# Patient Record
Sex: Female | Born: 1955
Health system: Southern US, Community
[De-identification: ages and names within clinical notes are randomized; demographics above are authoritative.]

## PROBLEM LIST (undated history)

## (undated) DIAGNOSIS — G4730 Sleep apnea, unspecified: Secondary | ICD-10-CM

## (undated) DIAGNOSIS — E119 Type 2 diabetes mellitus without complications: Secondary | ICD-10-CM

## (undated) DIAGNOSIS — I10 Essential (primary) hypertension: Secondary | ICD-10-CM

## (undated) DIAGNOSIS — E7800 Pure hypercholesterolemia, unspecified: Secondary | ICD-10-CM

## (undated) HISTORY — DX: Sleep apnea, unspecified: G47.30

## (undated) HISTORY — PX: MYOMECTOMY: SHX85

## (undated) HISTORY — PX: ABDOMINAL HYSTERECTOMY: SHX81

## (undated) HISTORY — PX: CATARACT EXTRACTION: SUR2

---

## 1979-06-26 HISTORY — PX: APPENDECTOMY: SHX54

## 2005-09-07 ENCOUNTER — Encounter: Admission: RE | Admit: 2005-09-07 | Discharge: 2005-09-07 | Payer: Self-pay | Admitting: Family Medicine

## 2005-10-02 ENCOUNTER — Encounter: Admission: RE | Admit: 2005-10-02 | Discharge: 2005-10-02 | Payer: Self-pay | Admitting: Family Medicine

## 2008-09-19 ENCOUNTER — Observation Stay (HOSPITAL_COMMUNITY): Admission: EM | Admit: 2008-09-19 | Discharge: 2008-09-20 | Payer: Self-pay | Admitting: Emergency Medicine

## 2008-09-20 ENCOUNTER — Encounter: Payer: Self-pay | Admitting: Infectious Disease

## 2008-11-16 ENCOUNTER — Encounter: Admission: RE | Admit: 2008-11-16 | Discharge: 2008-11-16 | Payer: Self-pay | Admitting: Family Medicine

## 2010-07-16 ENCOUNTER — Encounter: Payer: Self-pay | Admitting: Family Medicine

## 2010-10-05 ENCOUNTER — Inpatient Hospital Stay (INDEPENDENT_AMBULATORY_CARE_PROVIDER_SITE_OTHER)
Admission: RE | Admit: 2010-10-05 | Discharge: 2010-10-05 | Disposition: A | Discharge status: Home / Self Care | Payer: PRIVATE HEALTH INSURANCE | Source: Ambulatory Visit | Attending: Emergency Medicine | Admitting: Emergency Medicine

## 2010-10-05 DIAGNOSIS — M2550 Pain in unspecified joint: Secondary | ICD-10-CM

## 2010-10-05 DIAGNOSIS — Z9119 Patient's noncompliance with other medical treatment and regimen: Secondary | ICD-10-CM

## 2010-10-05 DIAGNOSIS — E119 Type 2 diabetes mellitus without complications: Secondary | ICD-10-CM

## 2010-10-05 LAB — POCT I-STAT, CHEM 8
BUN: 19 mg/dL (ref 6–23)
BUN: 12 mg/dL (ref 6–23)
Calcium, Ion: 1.2 mmol/L (ref 1.12–1.32)
Calcium, Ion: 1.16 mmol/L (ref 1.12–1.32)
Chloride: 104 mEq/L (ref 96–112)
Chloride: 104 mEq/L (ref 96–112)
Creatinine, Ser: 0.5 mg/dL (ref 0.4–1.2)
Creatinine, Ser: 0.5 mg/dL (ref 0.4–1.2)
Glucose, Bld: 208 mg/dL — ABNORMAL HIGH (ref 70–99)
Glucose, Bld: 339 mg/dL — ABNORMAL HIGH (ref 70–99)
HCT: 38 % (ref 36.0–46.0)
HCT: 42 % (ref 36.0–46.0)
Hemoglobin: 12.9 g/dL (ref 12.0–15.0)
Hemoglobin: 14.3 g/dL (ref 12.0–15.0)
Potassium: 3.8 mEq/L (ref 3.5–5.1)
Potassium: 3.8 mEq/L (ref 3.5–5.1)
Sodium: 140 mEq/L (ref 135–145)
Sodium: 137 mEq/L (ref 135–145)
TCO2: 25 mmol/L (ref 0–100)
TCO2: 24 mmol/L (ref 0–100)

## 2010-10-05 LAB — BASIC METABOLIC PANEL
BUN: 11 mg/dL (ref 6–23)
BUN: 13 mg/dL (ref 6–23)
CO2: 25 mEq/L (ref 19–32)
CO2: 26 mEq/L (ref 19–32)
Calcium: 8.6 mg/dL (ref 8.4–10.5)
Calcium: 8.7 mg/dL (ref 8.4–10.5)
Chloride: 103 mEq/L (ref 96–112)
Chloride: 104 mEq/L (ref 96–112)
Creatinine, Ser: 0.45 mg/dL (ref 0.4–1.2)
Creatinine, Ser: 0.57 mg/dL (ref 0.4–1.2)
GFR calc Af Amer: >60 mL/min (ref >60)
GFR calc Af Amer: >60 mL/min (ref >60)
GFR calc non Af Amer: >60 mL/min (ref >60)
GFR calc non Af Amer: >60 mL/min (ref >60)
Glucose, Bld: 249 mg/dL — ABNORMAL HIGH (ref 70–99)
Glucose, Bld: 255 mg/dL — ABNORMAL HIGH (ref 70–99)
Potassium: 3.6 mEq/L (ref 3.5–5.1)
Potassium: 3.9 mEq/L (ref 3.5–5.1)
Sodium: 135 mEq/L (ref 135–145)
Sodium: 135 mEq/L (ref 135–145)

## 2010-10-05 LAB — CBC
HCT: 36.4 % (ref 36.0–46.0)
HCT: 38.9 % (ref 36.0–46.0)
Hemoglobin: 12.5 g/dL (ref 12.0–15.0)
Hemoglobin: 13.5 g/dL (ref 12.0–15.0)
MCHC: 34.3 g/dL (ref 30.0–36.0)
MCHC: 34.7 g/dL (ref 30.0–36.0)
MCV: 84.8 fL (ref 78.0–100.0)
MCV: 86.8 fL (ref 78.0–100.0)
Platelets: 166 10*3/uL (ref 150–400)
Platelets: 172 10*3/uL (ref 150–400)
RBC: 4.19 MIL/uL (ref 3.87–5.11)
RBC: 4.59 MIL/uL (ref 3.87–5.11)
RDW: 13.1 % (ref 11.5–15.5)
RDW: 13.5 % (ref 11.5–15.5)
WBC: 3.6 10*3/uL — ABNORMAL LOW (ref 4.0–10.5)
WBC: 4.8 10*3/uL (ref 4.0–10.5)

## 2010-10-05 LAB — URINALYSIS, ROUTINE W REFLEX MICROSCOPIC
Bilirubin Urine: NEGATIVE
Glucose, UA: >1000 mg/dL — AB
Hgb urine dipstick: NEGATIVE
Ketones, ur: 40 mg/dL — AB
Leukocytes, UA: NEGATIVE
Nitrite: NEGATIVE
Protein, ur: NEGATIVE mg/dL
Specific Gravity, Urine: >1.046 — ABNORMAL HIGH (ref 1.005–1.030)
Urobilinogen, UA: 1 mg/dL (ref 0.0–1.0)
pH: 6 (ref 5.0–8.0)

## 2010-10-05 LAB — CARDIAC PANEL(CRET KIN+CKTOT+MB+TROPI)
CK, MB: 0.7 ng/mL (ref 0.3–4.0)
Relative Index: INVALID (ref 0.0–2.5)
Total CK: 55 U/L (ref 7–177)
Troponin I: <0.01 ng/mL (ref 0.00–0.06)

## 2010-10-05 LAB — COMPREHENSIVE METABOLIC PANEL
ALT: 22 U/L (ref 0–35)
AST: 19 U/L (ref 0–37)
Albumin: 4.1 g/dL (ref 3.5–5.2)
Alkaline Phosphatase: 109 U/L (ref 39–117)
BUN: 10 mg/dL (ref 6–23)
CO2: 24 mEq/L (ref 19–32)
Calcium: 9.2 mg/dL (ref 8.4–10.5)
Chloride: 104 mEq/L (ref 96–112)
Creatinine, Ser: 0.49 mg/dL (ref 0.4–1.2)
GFR calc Af Amer: >60 mL/min (ref >60)
GFR calc non Af Amer: >60 mL/min (ref >60)
Glucose, Bld: 347 mg/dL — ABNORMAL HIGH (ref 70–99)
Potassium: 3.7 mEq/L (ref 3.5–5.1)
Sodium: 138 mEq/L (ref 135–145)
Total Bilirubin: 0.6 mg/dL (ref 0.3–1.2)
Total Protein: 7 g/dL (ref 6.0–8.3)

## 2010-10-05 LAB — DIFFERENTIAL
Basophils Absolute: 0 10*3/uL (ref 0.0–0.1)
Basophils Relative: 1 % (ref 0–1)
Eosinophils Absolute: 0.1 10*3/uL (ref 0.0–0.7)
Eosinophils Relative: 2 % (ref 0–5)
Lymphocytes Relative: 43 % (ref 12–46)
Lymphs Abs: 1.6 10*3/uL (ref 0.7–4.0)
Monocytes Absolute: 0.7 10*3/uL (ref 0.1–1.0)
Monocytes Relative: 19 % — ABNORMAL HIGH (ref 3–12)
Neutro Abs: 1.3 10*3/uL — ABNORMAL LOW (ref 1.7–7.7)
Neutrophils Relative %: 35 % — ABNORMAL LOW (ref 43–77)

## 2010-10-05 LAB — CK TOTAL AND CKMB (NOT AT ARMC)
CK, MB: 1 ng/mL (ref 0.3–4.0)
Relative Index: INVALID (ref 0.0–2.5)
Total CK: 68 U/L (ref 7–177)

## 2010-10-05 LAB — LIPID PANEL
Cholesterol: 156 mg/dL (ref 0–200)
HDL: 28 mg/dL — ABNORMAL LOW (ref >39)
LDL Cholesterol: UNDETERMINED mg/dL (ref 0–99)
Total CHOL/HDL Ratio: 5.6 RATIO
Triglycerides: 965 mg/dL — ABNORMAL HIGH (ref <150)
VLDL: UNDETERMINED mg/dL (ref 0–40)

## 2010-10-05 LAB — POCT CARDIAC MARKERS
CKMB, poc: 1.1 ng/mL (ref 1.0–8.0)
Myoglobin, poc: 40.6 ng/mL (ref 12–200)
Troponin i, poc: <0.05 ng/mL (ref 0.00–0.09)

## 2010-10-05 LAB — RAPID URINE DRUG SCREEN, HOSP PERFORMED
Amphetamines: NOT DETECTED
Barbiturates: NOT DETECTED
Benzodiazepines: NOT DETECTED
Cocaine: NOT DETECTED
Opiates: NOT DETECTED
Tetrahydrocannabinol: NOT DETECTED

## 2010-10-05 LAB — GLUCOSE, CAPILLARY
Glucose-Capillary: 200 mg/dL — ABNORMAL HIGH (ref 70–99)
Glucose-Capillary: 143 mg/dL — ABNORMAL HIGH (ref 70–99)
Glucose-Capillary: 239 mg/dL — ABNORMAL HIGH (ref 70–99)
Glucose-Capillary: 262 mg/dL — ABNORMAL HIGH (ref 70–99)
Glucose-Capillary: 264 mg/dL — ABNORMAL HIGH (ref 70–99)
Glucose-Capillary: 265 mg/dL — ABNORMAL HIGH (ref 70–99)
Glucose-Capillary: 274 mg/dL — ABNORMAL HIGH (ref 70–99)
Glucose-Capillary: 347 mg/dL — ABNORMAL HIGH (ref 70–99)
Glucose-Capillary: 372 mg/dL — ABNORMAL HIGH (ref 70–99)

## 2010-10-05 LAB — D-DIMER, QUANTITATIVE (NOT AT ARMC): D-Dimer, Quant: 0.5 ug/mL-FEU — ABNORMAL HIGH (ref 0.00–0.48)

## 2010-10-05 LAB — POCT URINALYSIS DIP (DEVICE)
Bilirubin Urine: NEGATIVE
Glucose, UA: 500 mg/dL — AB
Hgb urine dipstick: NEGATIVE
Ketones, ur: 15 mg/dL — AB
Nitrite: NEGATIVE
Protein, ur: 30 mg/dL — AB
Specific Gravity, Urine: >=1.03 (ref 1.005–1.030)
Urobilinogen, UA: 0.2 mg/dL (ref 0.0–1.0)
pH: 5.5 (ref 5.0–8.0)

## 2010-10-05 LAB — FOLATE RBC: RBC Folate: 817 ng/mL — ABNORMAL HIGH (ref 180–600)

## 2010-10-05 LAB — APTT: aPTT: 30 seconds (ref 24–37)

## 2010-10-05 LAB — HEMOGLOBIN A1C
Hgb A1c MFr Bld: 11.3 % — ABNORMAL HIGH (ref 4.6–6.1)
Mean Plasma Glucose: 278 mg/dL

## 2010-10-05 LAB — TECHNOLOGIST SMEAR REVIEW

## 2010-10-05 LAB — PROTIME-INR
INR: 1 (ref 0.00–1.49)
Prothrombin Time: 13.3 seconds (ref 11.6–15.2)

## 2010-10-05 LAB — MAGNESIUM: Magnesium: 2.3 mg/dL (ref 1.5–2.5)

## 2010-10-05 LAB — TSH: TSH: 0.801 u[IU]/mL (ref 0.350–4.500)

## 2010-10-05 LAB — VITAMIN B12: Vitamin B-12: 438 pg/mL (ref 211–911)

## 2010-10-05 LAB — HEPARIN LEVEL (UNFRACTIONATED)
Heparin Unfractionated: 0.2 IU/mL — ABNORMAL LOW (ref 0.30–0.70)
Heparin Unfractionated: 0.33 IU/mL (ref 0.30–0.70)

## 2010-10-05 LAB — BRAIN NATRIURETIC PEPTIDE: Pro B Natriuretic peptide (BNP): <30 pg/mL (ref 0.0–100.0)

## 2010-10-05 LAB — HIV ANTIBODY (ROUTINE TESTING W REFLEX): HIV: NONREACTIVE

## 2010-10-05 LAB — TROPONIN I: Troponin I: <0.01 ng/mL (ref 0.00–0.06)

## 2010-11-07 NOTE — H&P (Signed)
NAMERYLEA, SELWAY                 ACCOUNT NO.:  [id number]   MEDICAL RECORD NO.:  [id number]          PATIENT TYPE:  INP   LOCATION:  2918                         FACILITY:  [facility]   PHYSICIAN:  Acey Lav, MD  DATE OF BIRTH:  1955/08/01   DATE OF ADMISSION:  09/19/2008  DATE OF DISCHARGE:                              HISTORY & PHYSICAL   CHIEF COMPLAINT:  Chest pain.   HISTORY OF PRESENT ILLNESS:  Ms. Breisch is a 55 year old African American  lady with a past medical history significant for diabetes, hypertension,  hyperlipidemia, and smoking who presents with new onset chest pain that  began on Friday.  On Friday morning, she was awoken by a chest pain that  she describes as a sharp sensation underneath her left breast going up  into her left arm, that was not accompanied by nausea or diaphoresis,  but was quite disturbing to the patient.  She was awoken from sleep in  the morning with this chest pain.  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  During  the day, her chest pain persisted throughout the entire day.  There were  times at which it got better and went down to about 5-6/10 in severity  and other times it went back to 8 or 9/10 in severity.  The only  exacerbating factor that she identified on Friday was that exertion seem  to make the chest pain worse.  She then went to sleep on Friday night  still with chest pain went to sleep, then was awoken Saturday morning  again with chest discomfort, but now actually with a chest pressure.  This chest pressure was 7/10 in severity and persisted throughout the  day at times.  It went down to about 5/10 in severity as well, but again  persisted.  The chest pressure was made worse with exertion.  Additionally, she noted some dyspnea on exertion while at work.  She  went to bed on Saturday night and then was awoken again this Sunday  morning with sharp chest pain under  the left breast, going into her neck  and left side of her head in her frontal and temporal distribution.  She  additionally now again had chest pressure substernally and had pain  going into her left arm as well as numbness down the entire left side of  her left arm.  Additionally, she complained of some pain in her left  leg.  She was quite concerned by this and came via EMS to the emergency  department.  Her leg pain had disappeared by the time she arrived in the  emergency department.  She was given 4 baby aspirins and then given a  sublingual nitroglycerin.  The sublingual nitroglycerin helped with her  chest pressure which went away.  Additionally, her headache went away.  Her left-sided sharp chest pain and pain going down into her left arm  did persist.  Her initial cardiac enzymes in the emergency department  were negative.  Her D-dimer was  slightly positive, but her CT angiogram  was negative for pulmonary embolism.  EKG showed sinus rhythm with no  significant ST-T segment elevation or depression or T-wave inversion.  There were no prior EKGs for comparison.   The patient lives with her husband and two children.  There has not been  any sick contacts at home.   REVIEW OF SYSTEMS:  Negative for fevers, chills, nausea.   PAST MEDICAL HISTORY:  1. Diabetes mellitus.  2. Hyperlipidemia.  3. Fibroids.  4. Seems to have undiagnosed hypertension.   PAST SURGICAL HISTORY:  She had a myomectomy in 1981 for fibroids.  She  had a total abdominal hysterectomy with removal of one of her ovaries 10  years ago.   ALLERGIES:  No known drug allergies.   MEDICATIONS:  1. Aspirin 81 mg daily, started on Friday.  2. Amaryl 1 mg in the morning.  3. Zocor 20 mg daily.   Review of systems as described above, otherwise 10-point review systems  is negative.   SOCIAL HISTORY:  The patient has been a smoker since the age of 33,  smoking approximately a pack per day.  She did stop when her  children  were at early age, but then resumed later.  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  She denies other recreational drug  use.   FAMILY HISTORY:  There is no history of early coronary disease in first-  degree relatives.  Her father did have a stroke at the age of 17.   PHYSICAL EXAMINATION:  VITAL SIGNS:  Blood pressure in the ER initially  144/76, pulse 77, respirations 20, pulse ox 100% on 2 L via nasal  cannula, T-maximum was 97.9.  GENERAL:  Quite pleasant lady, alert and oriented x4.  HEENT:  Normocephalic, atraumatic.  Pupils equal, round, and reactive to  light.  Sclerae anicteric.  Oropharynx clear.  She had no frontal  maxillary tenderness.  No tenderness around her temporal area.  NECK:  Supple.  CARDIOVASCULAR:  Regular rate and rhythm.  No murmurs, gallops, or rubs.  LUNGS:  Clear to auscultation bilaterally without wheezes, rhonchi, or  rales.  ABDOMEN:  Soft, nondistended, nontender.  LOWER EXTREMITIES:  Without edema.  NEUROLOGIC:  Her cranial nerves II through XII are grossly intact.  Her  sensation and strength was intact in upper and lower extremities.  MUSCULOSKELETAL:  Palpation underneath the left breast and along the  left chest did reproduce the sharp chest pain that she experienced.  Palpation of the upper arm also reproduced some of the arm discomfort,  but not the numbness and did not reproduce pain lower down.   LABORATORY DATA:  EKG as described above showing normal sinus rhythm  with no significant ST or T-wave changes.  There was a T-wave inversion  in V1, but that is the only T-wave inversion that I can find.  ST  segments were normal.  Cardiac enzymes were negative.  Urinalysis was  negative.  Comprehensive metabolic panel notable for a blood glucose of  347, normal creatinine 0.49, potassium of 3.7, otherwise norma.  CBC  with differential was notable for slightly depressed white count at 3.6,  hemoglobin 13.5,  platelets 172, ANC slightly depressed at 1.3 as well.   ASSESSMENT AND PLAN:  This is a 55 year old African American lady with  hypertension, hyperlipidemia, diabetes mellitus, and smoking who  presents with mixture of atypical chest pain, also with some chest  pressure more concerning  for genuine angina.  1. Unstable angina:  Given this new onset of chest pain and the      patient with multiple risk factors, this is the definition unstable      angina.  Her chest pressure is bit concerning that this might      represent real ischemia.  She also has other features of pain      radiating to the neck down the left arm with numbness and dyspnea      on exertion that have been present as well which are also      concerning.  She has some other features that suggest more atypical      chest pain and particularly the fact that the chest pain has lasted      throughout an entire day for several days even the chest pressure      lasted for near a day with some fluctuations.  Some of the chest      pain under her left breast is reproducible on exam, suggestive that      part of her pain may be due to musculoskeletal pain.  In any case      this patient needs to be treated as the patient with unstable      angina; therefore, I am going to give her intravenous heparin, I am      going to give her some morphine.  If her arm pain does not      disappear with morphine, I am going to start her on the      nitroglycerin drip.  I am going to give her aspirin, I am going to      give her statin.  We will cycle her cardiac enzymes and recheck her      lipids in the morning.  I have talked to Dr. Patty Sermons from      Cardiology.  The patient will need a minimum stress test tomorrow,      may benefit in fact from a cardiac catheterization.  Also, add on a      urine drug screen as this does not seem to be done in the ER.  I      will defer beta blockade for now until Dr. Patty Sermons sees the      patient later  today.  2. Diabetes mellitus, up with the patient's sliding scale insulin and      hold her Amaryl.  3. Slight leukopenia and neutropenia.  I am going to repeat these labs      in the morning.  It is not clear if her medication is an obvious      cause in terms of medications, and I am going to screen her for HIV      as this is a general principle.  We will check her B12 and folate      as well.  She does not appear to be anemic.  4. Prophylaxis.  The patient to be fully anticoagulated.  I am going      to give her proton pump inhibitor given that she is fully      anticoagulated, as this is one of the few indications for stress      ulcer prophylaxis in a non-ICU patient.      Acey Lav, MD  Electronically Signed     CV/MEDQ  D:  09/19/2008  T:  09/20/2008  Job:  [id number]   cc:   [facility]  Cassell Clement, M.D.

## 2010-11-07 NOTE — H&P (Signed)
Shannon [name], Shannon [name]                 ACCOUNT NO.:  [id number]   MEDICAL RECORD NO.:  [id number]          PATIENT TYPE:  INP   LOCATION:  2918                         FACILITY:  [facility]   PHYSICIAN:  Cassell Clement, M.D. DATE OF BIRTH:  [birth year]   DATE OF ADMISSION:  09/19/2008  DATE OF DISCHARGE:                              HISTORY & PHYSICAL   I was asked to see this patient by the [facility] hospitalist for  evaluation of chest pain.   This is a 55 year old African American woman who was admitted on September 19, 2008, with a 3-day history of stuttering chest pain with left arm  radiation, substernal pressure, left shoulder discomfort, and shortness  of breath.  The discomfort began on Friday, September 17, 2008.  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  She does not have any prior  history of known heart problems.  She has not had previous ischemic  evaluation.  She is followed medically at [facility]  for diabetes.   The patient has been a diabetic for about 5 years.  She did not tolerate  metformin and is now on glipizide.  Other medicines include aspirin,  Zocor, and Amaryl.  She is on 20 of Zocor, 81 of aspirin, and 1 mg of  Amaryl.  She smokes between half-a-pack and a full pack of cigarettes a  day.  She does not drink alcohol.  She does not use drugs.   FAMILY HISTORY:  Her mother is alive, but has hypertension and diabetes.  Father is deceased at 5 and had strokes and diabetes.   SOCIAL HISTORY:  She works at [employer].   REVIEW OF SYSTEMS:  GASTROINTESTINAL:  No history of peptic ulcer  disease, change in bowel habits, hematochezia or melena.  She denies any  genitourinary symptoms.  She denies cough or sputum production.  She has  had  no arthritic complaints.  She has no history of thyroid problems.  All other systems negative in detail.   PHYSICAL EXAMINATION:  VITAL SIGNS:  Blood pressure 144/76, the pulse of  77 and regular, respirations are 20, O2 sat is 100%, temperature 97.9.  GENERAL APPEARANCE:  A well-developed, well-nourished middle-aged woman  in no acute distress.  SKIN:  Clear.  HEENT:  Pupils equal and reactive.  Sclerae nonicteric.  Mouth and  pharynx normal.  Carotids normal.  Jugular venous pressure normal.  Thyroid normal.  No lymphadenopathy.  CHEST:  Clear.  HEART:  No murmur, gallop, rub, or click.  There is no chest wall  tenderness.  ABDOMEN:  Soft without hepatosplenomegaly or masses.  EXTREMITIES:  No phlebitis or edema.  Pedal pulses are good.   Her electrocardiogram is nonacute.   Her initial cardiac enzymes are negative.  She continues to have  intermittent chest discomfort.  She did have a CT  angio of the chest  because of borderline elevation of D-dimer and there was no evidence of  pulmonary emboli or dissection of the aorta.  She did have a tiny  nodule, which will need followup in 6-12 months.   Hemoglobin is 13.5, white count 3600.  Electrolytes normal blood sugar  high at 347, BUN 10, creatinine 0.49.  Clotting studies are normal.  Liver functions normal.  Cardiac enzymes normal.  Lipid panel is  pending.  BNP is less than 30.  TSH is pending.  Hemoglobin A1c pending.   IMPRESSION:  1. Stuttering 3-day history of chest pain, rule out acute coronary      syndrome.  2. Poorly controlled diabetes mellitus.  3. Cigarette abuse.  4. Hypercholesterolemia.   RECOMMENDATIONS:  1. We recommend that we proceed with cardiac catheterization tomorrow,      which is Monday.  I will arrange with my partners.  2. We will also add beta-blocker and continue aspirin, Zocor,      nitrates, and IV heparin as already ordered.           ______________________________  Cassell Clement,  M.D.     TB/MEDQ  D:  09/19/2008  T:  09/20/2008  Job:  [id number]   cc:   [facility].

## 2010-11-07 NOTE — Cardiovascular Report (Signed)
NAME:  Shannon [name], Shannon [name]                 ACCOUNT NO.:  [id number]   MEDICAL RECORD NO.:  [id number]          PATIENT TYPE:  INP   LOCATION:  4707                         FACILITY:  [facility]   PHYSICIAN:  Elmore Guise., M.D.DATE OF BIRTH:  14-Mar-1956   DATE OF PROCEDURE:  09/20/2008  DATE OF DISCHARGE:  09/20/2008                            CARDIAC CATHETERIZATION   INDICATIONS FOR PROCEDURE:  Chest discomfort, multiple cardiac risk  factors.   HISTORY OF PRESENT ILLNESS:  [name] is a very pleasant 55 year old  African American female with past medical history of diabetes mellitus,  dyslipidemia, tobacco dependence who presented with chest discomfort.  She is referred for cardiac catheterization.   DESCRIPTION OF PROCEDURE:  The patient was brought to the Cardiac Cath  Lab after appropriate informed consent and she was prepped and draped in  sterile fashion.  Approximately 10 mL of 1% lidocaine was used for local  anesthesia.  A 5-French sheath was placed in the right femoral artery  without difficulty.  Coronary angiography, LV angiography and aortic  root angiography were performed.  The patient was transferred from the  Cardiac Cath Lab in stable condition.   FINDINGS:  1. Left Main:  Long normal-appearing artery.  2. LAD:  Moderate-sized vessel, normal-appearing  3. D1 small to moderate-sized vessel, normal-appearing  4. LCX:  Nondominant, normal-appearing  5. OM-1:  Small vessel with mild luminal irregularities.  6. OM-1/OM-2:  Both moderate-sized vessels with mild luminal      irregularities.  7. RCA:  Dominant, normal-appearing artery.  8. PDA/PLV:  Patent, normal-appearing  9. LV:  EF 60%.  No wall motion abnormalities.  LVEDP is 10 mmHg.  10.Aortic root angiogram:  Shows normal appearing aortic root.  No      dissection noted.  No aortic insufficiency.   IMPRESSION:  1. Normal-appearing coronary arteries.  2. Preserved left ventricular systolic function, ejection  fraction      60%.  3. Normal-appearing aortic root.   PLAN:  At this time, I would recommend aggressive risk factor  modification as indicated.  I do recommend complete tobacco cessation.      Elmore Guise., M.D.  Electronically Signed     TWK/MEDQ  D:  09/20/2008  T:  09/21/2008  Job:  [id number]   cc:   Shannon [name], M.D.

## 2010-11-07 NOTE — Discharge Summary (Signed)
Shannon [name], Shannon [name]                 ACCOUNT NO.:  [id number]   MEDICAL RECORD NO.:  [id number]          PATIENT TYPE:  INP   LOCATION:  4707                         FACILITY:  [facility]   PHYSICIAN:  Ruthy Dick, MD    DATE OF BIRTH:  04/29/1956   DATE OF ADMISSION:  09/19/2008  DATE OF DISCHARGE:  09/20/2008                               DISCHARGE SUMMARY   REASON FOR ADMISSION:  Chest pain.   FINAL DISCHARGE DIAGNOSES:  1. Atypical chest pain, noncardiac.  2. Right upper lobe lung nodule.  3. Poorly-controlled diabetes mellitus type 2.  4. Hypertension.  5. Dyslipidemia (hypertriglyceridemia).  6. History of fibroids.  7. Tobacco abuse.   CONSULTATIONS DURING THIS ADMISSION:  Cassell Clement, MD, Cardiology.   PROCEDURES DONE DURING THIS ADMISSION:  1. CT angiogram of the chest which showed no pulmonary embolism but      did show a 2-mm nodule on the right upper lobe and since the      patient is at high risk for bronchogenic carcinoma from smoking      recommendations for the patient to have a CT scan in 1 year.  2. Left heart catheterization which showed no coronary artery disease      and ejection fraction was said to be 60%.   HISTORY OF PRESENT ILLNESS AND HOSPITAL COURSE:  This is a 52-year  African American lady with a history of diabetes mellitus, hypertension,  and dyslipidemia and also a smoker who came in with a history of chest  pain of about 5-6/10 intensity and who because of numerous risk factors  was recommended for left heart catheterization.  She had the procedure  done with the above-noted findings, specifically no coronary artery  disease.  Recommendation by Cardiology therefore was that the patient  can go home today if her leg is normal.  We did notice that the  patient's diabetes is very poorly controlled with a hemoglobin A1c of  111.  We decided to increase her Amaryl to 4 mg daily and we will  recommend primary care physician to try to adjust the  patient's diabetic  medications for better control of glycemic state.  We have also  recommended outpatient diabetes education.  The patient is to follow  with the [facility] in 1-2 weeks and she is to call  for this appointment.   DISCHARGE MEDICATIONS:  1. Zocor 20 mg daily.  2. Amaryl now increased to 4 mg p.o. daily.  3. Aspirin 81 mg daily.  4. Fenofibrate 145 mg p.o. daily.  5. Lisinopril 10 mg p.o. daily.   The patient can also take over-the-counter Tylenol for her chest pain  which according to her today is only about 3/10.  She is not short of  breath and refuses abdominal pain, nausea, or vomiting.   PHYSICAL EXAMINATION:  VITAL SIGNS:  Today are stable.  CHEST:  Clear to auscultation bilaterally.  ABDOMEN:  Soft, nontender.  EXTREMITIES:  No clubbing, no cyanosis, no edema.  CARDIOVASCULAR:  Some first and second heart sounds only.  CENTRAL NERVOUS SYSTEM:  Nonfocal.   DISCHARGE INSTRUCTIONS:  The patient has been advised to quit tobacco  and counseling has been given.  She voices understanding.   TIME SPENT:  Time used for discharge planning is less than 30 minutes.      Ruthy Dick, MD  Electronically Signed     GU/MEDQ  D:  09/20/2008  T:  09/21/2008  Job:  [id number]   cc:   [facility]

## 2013-04-09 ENCOUNTER — Other Ambulatory Visit: Payer: Self-pay | Admitting: Internal Medicine

## 2013-04-09 DIAGNOSIS — R1031 Right lower quadrant pain: Secondary | ICD-10-CM

## 2013-04-10 LAB — HM COLONOSCOPY

## 2013-04-13 ENCOUNTER — Other Ambulatory Visit: Payer: PRIVATE HEALTH INSURANCE

## 2014-01-01 ENCOUNTER — Encounter (HOSPITAL_COMMUNITY): Payer: Self-pay | Admitting: Emergency Medicine

## 2014-01-01 ENCOUNTER — Emergency Department (INDEPENDENT_AMBULATORY_CARE_PROVIDER_SITE_OTHER): Payer: BC Managed Care – PPO

## 2014-01-01 ENCOUNTER — Emergency Department (HOSPITAL_COMMUNITY)
Admission: EM | Admit: 2014-01-01 | Discharge: 2014-01-01 | Disposition: A | Discharge status: Home / Self Care | Payer: BC Managed Care – PPO | Source: Home / Self Care | Attending: Family Medicine | Admitting: Family Medicine

## 2014-01-01 DIAGNOSIS — J069 Acute upper respiratory infection, unspecified: Secondary | ICD-10-CM

## 2014-01-01 HISTORY — DX: Type 2 diabetes mellitus without complications: E11.9

## 2014-01-01 MED ORDER — HYDROCOD POLST-CHLORPHEN POLST 10-8 MG/5ML PO LQCR: 5.0000 mL | Freq: Two times a day (BID) | ORAL | Status: DC | PRN

## 2014-01-01 MED ORDER — IPRATROPIUM BROMIDE 0.06 % NA SOLN: 2.0000 | Freq: Four times a day (QID) | NASAL | Status: DC

## 2014-01-01 NOTE — ED Provider Notes (Signed)
CSN: [id number]     Arrival date & time 01/01/14  [id number] History   First MD Initiated Contact with Patient 01/01/14 [id number]     Chief Complaint  Patient presents with  . Cough   (Consider location/radiation/quality/duration/timing/severity/associated sxs/prior Treatment) Patient is a 58 y.o. female presenting with cough. The history is provided by the patient.  Cough Cough characteristics:  Non-productive, dry and hacking Severity:  Moderate Onset quality:  Gradual Duration:  1 week Progression:  Unchanged Chronicity:  New Smoker: no   Context comment:  Seen by lmd and given z-pack without relief. Associated symptoms: rhinorrhea   Associated symptoms: no chest pain, no chills, no fever, no rash, no shortness of breath, no sinus congestion and no wheezing     Past Medical History  Diagnosis Date  . Diabetes mellitus without complication    Past Surgical History  Procedure Laterality Date  . Abdominal hysterectomy    . Cesarean section    . Myomectomy     History reviewed. No pertinent family history. History  Substance Use Topics  . Smoking status: Never Smoker   . Smokeless tobacco: Not on file  . Alcohol Use: Yes   OB History   Grav Para Term Preterm Abortions TAB SAB Ect Mult Living                 Review of Systems  Constitutional: Negative.  Negative for fever and chills.  HENT: Positive for postnasal drip and rhinorrhea. Negative for congestion and sinus pressure.   Eyes: Negative.   Respiratory: Positive for cough. Negative for shortness of breath and wheezing.   Cardiovascular: Negative for chest pain.  Gastrointestinal: Negative.   Skin: Negative for rash.    Allergies  Review of patient's allergies indicates not on file.  Home Medications   Prior to Admission medications   Medication Sig Start Date End Date Taking? Authorizing Provider  Atorvastatin Calcium (LIPITOR PO) Take by mouth.   Yes Historical Provider, MD  Liraglutide (VICTOZA [place]) Inject into  the skin.   Yes Historical Provider, MD  LISINOPRIL PO Take by mouth.   Yes Historical Provider, MD  Pioglitazone HCl (ACTOS PO) Take by mouth.   Yes Historical Provider, MD  chlorpheniramine-HYDROcodone (TUSSIONEX PENNKINETIC ER) 10-8 MG/5ML LQCR Take 5 mLs by mouth every 12 (twelve) hours as needed for cough. 01/01/14   Linna HoffJames D Mayci Haning, MD  ipratropium (ATROVENT) 0.06 % nasal spray Place 2 sprays into the nose 4 (four) times daily. 01/01/14   Linna HoffJames D Alika Saladin, MD   BP 115/75  Pulse 86  Temp(Src) 98.3 F (36.8 C) (Oral)  Resp 16  SpO2 98% Physical Exam  Nursing note and vitals reviewed. Constitutional: She is oriented to person, place, and time. She appears well-developed and well-nourished. No distress.  HENT:  Head: Normocephalic.  Right Ear: External ear normal.  Left Ear: External ear normal.  Mouth/Throat: Oropharynx is clear and moist.  Eyes: Conjunctivae are normal. Pupils are equal, round, and reactive to light.  Neck: Normal range of motion. Neck supple. No thyromegaly present.  Cardiovascular: Regular rhythm and normal heart sounds.   Pulmonary/Chest: Effort normal and breath sounds normal.  Musculoskeletal: She exhibits no edema.  Lymphadenopathy:    She has no cervical adenopathy.  Neurological: She is alert and oriented to person, place, and time.  Skin: Skin is warm and dry.    ED Course  Procedures (including critical care time) Labs Review Labs Reviewed - No data to display  Imaging Review Dg  Chest 2 View  01/01/2014   CLINICAL DATA:  Left-sided chest pain  EXAM: CHEST  2 VIEW  COMPARISON:  09/19/2008  FINDINGS: The heart size and mediastinal contours are within normal limits. Both lungs are clear. The visualized skeletal structures are unremarkable.  IMPRESSION: No active cardiopulmonary disease.   Electronically Signed   By: Alcide Clever M.D.   On: 01/01/2014 09:24    X-rays reviewed and report per radiologist.  MDM   1. URI (upper respiratory infection)         Linna Hoff, MD 01/01/14 [id number]

## 2014-01-01 NOTE — ED Notes (Signed)
Pt  Has  Symptoms  Of  Cough   Productive  At  Times  Otherwise  Non productive at  Others    Pain l  Side  Chest  As  Well  As  Fatigue       Pt    Saw  PCP  4 days       Ago       And  Was  RX     z  Max          And had  Blood work   Done

## 2014-01-04 ENCOUNTER — Other Ambulatory Visit: Payer: Self-pay | Admitting: Nurse Practitioner

## 2014-01-04 DIAGNOSIS — R921 Mammographic calcification found on diagnostic imaging of breast: Secondary | ICD-10-CM

## 2014-01-13 ENCOUNTER — Ambulatory Visit
Admission: RE | Admit: 2014-01-13 | Discharge: 2014-01-13 | Disposition: A | Discharge status: Home / Self Care | Payer: BC Managed Care – PPO | Source: Ambulatory Visit | Attending: Nurse Practitioner | Admitting: Nurse Practitioner

## 2014-01-13 DIAGNOSIS — R921 Mammographic calcification found on diagnostic imaging of breast: Secondary | ICD-10-CM

## 2014-04-05 ENCOUNTER — Encounter (HOSPITAL_COMMUNITY): Payer: Self-pay | Admitting: Emergency Medicine

## 2014-04-05 ENCOUNTER — Ambulatory Visit (HOSPITAL_COMMUNITY): Payer: BC Managed Care – PPO | Attending: Emergency Medicine

## 2014-04-05 ENCOUNTER — Emergency Department (HOSPITAL_COMMUNITY)
Admission: EM | Admit: 2014-04-05 | Discharge: 2014-04-05 | Disposition: A | Discharge status: Home / Self Care | Payer: BC Managed Care – PPO | Source: Home / Self Care | Attending: Emergency Medicine | Admitting: Emergency Medicine

## 2014-04-05 DIAGNOSIS — R079 Chest pain, unspecified: Secondary | ICD-10-CM | POA: Diagnosis not present

## 2014-04-05 DIAGNOSIS — R69 Illness, unspecified: Principal | ICD-10-CM

## 2014-04-05 DIAGNOSIS — R05 Cough: Secondary | ICD-10-CM | POA: Diagnosis not present

## 2014-04-05 DIAGNOSIS — J111 Influenza due to unidentified influenza virus with other respiratory manifestations: Secondary | ICD-10-CM

## 2014-04-05 MED ORDER — OSELTAMIVIR PHOSPHATE 75 MG PO CAPS: 75.0000 mg | ORAL_CAPSULE | Freq: Two times a day (BID) | ORAL | Status: DC

## 2014-04-05 MED ORDER — HYDROCOD POLST-CHLORPHEN POLST 10-8 MG/5ML PO LQCR
5.0000 mL | Freq: Two times a day (BID) | ORAL | Status: DC | PRN
Start: 2014-04-05 — End: 2018-05-30

## 2014-04-05 NOTE — ED Provider Notes (Signed)
CSN: [id number]     Arrival date & time 04/05/14  [id number] History   First MD Initiated Contact with Patient 04/05/14 0830     Chief Complaint  Patient presents with  . URI   (Consider location/radiation/quality/duration/timing/severity/associated sxs/prior Treatment) HPI She is a 58 year old woman here for evaluation of cough and fatigue. She states she got the flu shot on Wednesday, and then started feeling poorly on Thursday. She describes feeling run down and fatigued on Thursday. She started coughing on Friday. On Saturday, she had yellow sputum and also developed a sore throat and some loss of her voice. Yesterday she was noticing some blood tinged sputum. She also reports some chest pain associated only with cough and taking deep breaths. She has decreased appetite, but is taking fluids well. She denies a nausea, vomiting, abdominal pain, diarrhea. No fevers at home, but does report some chills.  Past Medical History  Diagnosis Date  . Diabetes mellitus without complication    Past Surgical History  Procedure Laterality Date  . Abdominal hysterectomy    . Cesarean section    . Myomectomy     No family history on file. History  Substance Use Topics  . Smoking status: Never Smoker   . Smokeless tobacco: Not on file  . Alcohol Use: Yes   OB History   Grav Para Term Preterm Abortions TAB SAB Ect Mult Living                 Review of Systems  Constitutional: Positive for chills, appetite change and fatigue. Negative for fever.  HENT: Positive for sore throat and voice change. Negative for congestion, ear pain, rhinorrhea, sinus pressure and trouble swallowing.   Respiratory: Positive for cough. Negative for shortness of breath and wheezing.   Cardiovascular: Positive for chest pain (with cough).  Gastrointestinal: Negative for nausea, vomiting, abdominal pain and diarrhea.  Musculoskeletal: Positive for myalgias.  Skin: Negative for rash.    Allergies  Review of patient's  allergies indicates no known allergies.  Home Medications   Prior to Admission medications   Medication Sig Start Date End Date Taking? Authorizing Provider  Insulin Detemir (LEVEMIR [place]) Inject into the skin.   Yes Historical Provider, MD  Atorvastatin Calcium (LIPITOR PO) Take by mouth.    Historical Provider, MD  chlorpheniramine-HYDROcodone (TUSSIONEX PENNKINETIC ER) 10-8 MG/5ML LQCR Take 5 mLs by mouth every 12 (twelve) hours as needed for cough. 04/05/14   Charm RingsErin J Dorna Mallet, MD  ipratropium (ATROVENT) 0.06 % nasal spray Place 2 sprays into the nose 4 (four) times daily. 01/01/14   Linna HoffJames D Kindl, MD  Liraglutide (VICTOZA [place]) Inject into the skin.    Historical Provider, MD  LISINOPRIL PO Take by mouth.    Historical Provider, MD  oseltamivir (TAMIFLU) 75 MG capsule Take 1 capsule (75 mg total) by mouth 2 (two) times daily. 04/05/14   Charm RingsErin J Genola Yuille, MD  Pioglitazone HCl (ACTOS PO) Take by mouth.    Historical Provider, MD   BP 129/61  Pulse 98  Temp(Src) 98.4 F (36.9 C) (Oral)  Resp 20  Ht 5\' 3"  (1.6 m)  Wt 160 lb (72.576 kg)  BMI 28.35 kg/m2  SpO2 97% Physical Exam  Constitutional: She is oriented to person, place, and time. She appears well-developed and well-nourished. No distress.  Appears tired.  Lying on bed.  HENT:  Head: Normocephalic and atraumatic.  Right Ear: External ear normal.  Left Ear: External ear normal.  Mouth/Throat: No oropharyngeal exudate.  Eyes:  Conjunctivae are normal.  Neck: Neck supple.  Cardiovascular: Normal rate, regular rhythm and normal heart sounds.   No murmur heard. Pulmonary/Chest: Effort normal and breath sounds normal. No respiratory distress. She has no wheezes. She has no rales.  Lymphadenopathy:    She has no cervical adenopathy.  Neurological: She is alert and oriented to person, place, and time.  Skin: Skin is warm and dry. No rash noted.    ED Course  Procedures (including critical care time) Labs Review Labs Reviewed - No data to  display  Imaging Review Dg Chest 2 View  04/05/2014   CLINICAL DATA:  58 year old female productive cough for 3 days with blood tinged sputum. Chest pain with coughing. Initial encounter.  EXAM: CHEST  2 VIEW  COMPARISON:  01/01/2014.  Chest CTA 09/19/2008.  FINDINGS: Mildly improved lung volumes. Normal cardiac size and mediastinal contours. Visualized tracheal air column is within normal limits. No pneumothorax or pulmonary edema. No pleural effusion or confluent pulmonary opacity. No acute osseous abnormality identified.  IMPRESSION: No acute cardiopulmonary abnormality.   Electronically Signed   By: Augusto GambleLee  Hall M.D.   On: 04/05/2014 09:57     MDM   1. Influenza-like illness    Chest x-ray reviewed and negative for pneumonia. History is most consistent with a flulike illness. We'll go ahead and treat with Tamiflu. Tussionex provided for cough. Discussed symptomatic treatment as in after visit summary. Followup as needed.    Charm RingsErin J Lai Hendriks, MD 04/05/14 1013

## 2014-04-05 NOTE — Discharge Instructions (Signed)
You have a flu like illness. Take Tamiflu 1 pill twice a day for 5 days. Use the cough syrup every 12 hours as needed. You can also take honey for cough.  Get some rest and make sure you are drinking plenty of fluids. If you are getting worse (fevers, trouble breathing, vomiting) or just not better in the next 2 days, please come back.

## 2014-04-05 NOTE — ED Notes (Signed)
Reports since Thursday 10/8.  Cough, aching, weak, sore throat, laryngitis, and on Sunday 10/11 noticed yellow phlegm with blood tinge

## 2014-04-05 NOTE — ED Notes (Signed)
Patient transported to X-ray.  [facility]

## 2015-01-20 ENCOUNTER — Other Ambulatory Visit: Payer: Self-pay

## 2015-01-20 DIAGNOSIS — Z1231 Encounter for screening mammogram for malignant neoplasm of breast: Secondary | ICD-10-CM

## 2015-01-24 ENCOUNTER — Ambulatory Visit
Admission: RE | Admit: 2015-01-24 | Discharge: 2015-01-24 | Disposition: A | Discharge status: Home / Self Care | Payer: BLUE CROSS/BLUE SHIELD | Source: Ambulatory Visit

## 2015-01-24 DIAGNOSIS — Z1231 Encounter for screening mammogram for malignant neoplasm of breast: Secondary | ICD-10-CM

## 2016-05-04 ENCOUNTER — Other Ambulatory Visit: Payer: Self-pay | Admitting: Internal Medicine

## 2016-05-04 DIAGNOSIS — Z1231 Encounter for screening mammogram for malignant neoplasm of breast: Secondary | ICD-10-CM

## 2016-05-21 ENCOUNTER — Ambulatory Visit
Admission: RE | Admit: 2016-05-21 | Discharge: 2016-05-21 | Disposition: A | Discharge status: Home / Self Care | Payer: PRIVATE HEALTH INSURANCE | Source: Ambulatory Visit | Attending: Internal Medicine | Admitting: Internal Medicine

## 2016-05-21 DIAGNOSIS — Z1231 Encounter for screening mammogram for malignant neoplasm of breast: Secondary | ICD-10-CM

## 2016-05-22 ENCOUNTER — Other Ambulatory Visit: Payer: Self-pay | Admitting: Internal Medicine

## 2016-05-22 DIAGNOSIS — N6489 Other specified disorders of breast: Secondary | ICD-10-CM

## 2016-05-22 DIAGNOSIS — R928 Other abnormal and inconclusive findings on diagnostic imaging of breast: Secondary | ICD-10-CM

## 2016-05-28 ENCOUNTER — Ambulatory Visit
Admission: RE | Admit: 2016-05-28 | Discharge: 2016-05-28 | Disposition: A | Discharge status: Home / Self Care | Payer: PRIVATE HEALTH INSURANCE | Source: Ambulatory Visit | Attending: Internal Medicine | Admitting: Internal Medicine

## 2016-05-28 DIAGNOSIS — R928 Other abnormal and inconclusive findings on diagnostic imaging of breast: Secondary | ICD-10-CM

## 2016-05-28 DIAGNOSIS — N6489 Other specified disorders of breast: Secondary | ICD-10-CM

## 2016-06-05 ENCOUNTER — Ambulatory Visit (INDEPENDENT_AMBULATORY_CARE_PROVIDER_SITE_OTHER): Payer: PRIVATE HEALTH INSURANCE | Admitting: Podiatry

## 2016-06-05 ENCOUNTER — Encounter: Payer: Self-pay | Admitting: Podiatry

## 2016-06-05 VITALS — BP 135/78 | HR 81 | Resp 14

## 2016-06-05 DIAGNOSIS — M79674 Pain in right toe(s): Secondary | ICD-10-CM | POA: Diagnosis not present

## 2016-06-05 DIAGNOSIS — M79675 Pain in left toe(s): Secondary | ICD-10-CM

## 2016-06-05 DIAGNOSIS — B351 Tinea unguium: Secondary | ICD-10-CM

## 2016-06-05 NOTE — Progress Notes (Signed)
   Subjective:    Patient ID: Shannon [name], female    DOB: May 17, 1956, 60 y.o.   MRN: [id number]  HPI   Patient presents today complaining that her toenails are elongated and difficult to trim especially her hallux toenails which have become uncomfortable in the last 3-4 weeks. Patient states that she goes to a pedicurist to have her toenails trimmed. Patient denies any history of foot infection, ulceration or claudication. Patient is a diabetic with elevated A1c's She denies history of smoking  Review of Systems     Objective:   Physical Exam  Orientated 3  Vascular: No peripheral edema or calf tenderness bilaterally DP and PT pulses 2/4 bilaterally Reflex immediate bilaterally  Neurological: Sensation to 10 g monofilament wire intact 5/5 bilaterally Vibratory sensation reactive bilaterally Ankle reflex equal and reactive bilaterally  Dermatological: No open skin lesions bilaterally The toenails elongated, incurvated, discolored with maximum deformity and hallux toenails.  Musculoskeletal: HAV bilaterally Manual motor testing dorsi flexion, plantar flexion, inversion, eversion 5/5 bilaterally      Assessment & Plan:   Assessment: Diabetic with satisfactory neurovascular status Mycotic toenails with symptoms  Plan: Discussed treatment options for ingrowing of mycotic toenails. At this time as patient has sure history of increasing symptoms in the toenails especially hallux toenails recommend debridement. Also patient has has a history of elevated A1c Debridement of toenails 6-10 mechanical and electrical without any bleeding  Reappoint as needed or yearly

## 2016-06-05 NOTE — Patient Instructions (Signed)

## 2017-06-04 ENCOUNTER — Ambulatory Visit: Payer: PRIVATE HEALTH INSURANCE | Admitting: Podiatry

## 2017-07-11 ENCOUNTER — Other Ambulatory Visit: Payer: Self-pay | Admitting: Internal Medicine

## 2017-07-11 DIAGNOSIS — Z1231 Encounter for screening mammogram for malignant neoplasm of breast: Secondary | ICD-10-CM

## 2017-07-24 ENCOUNTER — Encounter: Payer: Self-pay | Admitting: Podiatry

## 2017-07-24 ENCOUNTER — Ambulatory Visit: Payer: PRIVATE HEALTH INSURANCE | Admitting: Podiatry

## 2017-07-24 DIAGNOSIS — M79674 Pain in right toe(s): Secondary | ICD-10-CM | POA: Diagnosis not present

## 2017-07-24 DIAGNOSIS — M79675 Pain in left toe(s): Secondary | ICD-10-CM

## 2017-07-24 DIAGNOSIS — B351 Tinea unguium: Secondary | ICD-10-CM

## 2017-07-24 DIAGNOSIS — E119 Type 2 diabetes mellitus without complications: Secondary | ICD-10-CM

## 2017-07-24 NOTE — Progress Notes (Signed)
Complaint:  Visit Type: Patient returns to my office for continued preventative foot care services. Complaint: Patient states" my nails have grown long and thick and become painful to walk and wear shoes" Patient has been diagnosed with DM with no foot complications. The patient presents for preventative foot care services. No changes to ROS  Podiatric Exam: Vascular: dorsalis pedis and posterior tibial pulses are palpable bilateral. Capillary return is immediate. Temperature gradient is WNL. Skin turgor WNL  Sensorium: Normal Semmes Weinstein monofilament test. Normal tactile sensation bilaterally. Nail Exam: Pt has thick disfigured discolored nails with subungual debris noted bilateral entire nail hallux through fifth toenails Ulcer Exam: There is no evidence of ulcer or pre-ulcerative changes or infection. Orthopedic Exam: Muscle tone and strength are WNL. No limitations in general ROM. No crepitus or effusions noted. Foot type and digits show no abnormalities. Bony prominences are unremarkable. Skin: No Porokeratosis. No infection or ulcers  Diagnosis:  Onychomycosis, , Pain in right toe, pain in left toes  Treatment & Plan Procedures and Treatment: Consent by patient was obtained for treatment procedures.   Debridement of mycotic and hypertrophic toenails, 1 through 5 bilateral and clearing of subungual debris. No ulceration, no infection noted.  Return Visit-Office Procedure: Patient instructed to return to the office for a follow up visit 3 months for continued evaluation and treatment.    Nabeeha Badertscher DPM 

## 2017-08-06 ENCOUNTER — Ambulatory Visit: Payer: PRIVATE HEALTH INSURANCE

## 2017-08-06 ENCOUNTER — Ambulatory Visit
Admission: RE | Admit: 2017-08-06 | Discharge: 2017-08-06 | Disposition: A | Discharge status: Home / Self Care | Payer: PRIVATE HEALTH INSURANCE | Source: Ambulatory Visit | Attending: Internal Medicine | Admitting: Internal Medicine

## 2017-08-06 DIAGNOSIS — Z1231 Encounter for screening mammogram for malignant neoplasm of breast: Secondary | ICD-10-CM

## 2017-12-05 ENCOUNTER — Other Ambulatory Visit: Payer: Self-pay | Admitting: Nurse Practitioner

## 2017-12-05 DIAGNOSIS — R229 Localized swelling, mass and lump, unspecified: Secondary | ICD-10-CM

## 2017-12-05 DIAGNOSIS — R1909 Other intra-abdominal and pelvic swelling, mass and lump: Secondary | ICD-10-CM

## 2017-12-05 DIAGNOSIS — IMO0002 Reserved for concepts with insufficient information to code with codable children: Secondary | ICD-10-CM

## 2017-12-13 ENCOUNTER — Ambulatory Visit
Admission: RE | Admit: 2017-12-13 | Discharge: 2017-12-13 | Disposition: A | Discharge status: Home / Self Care | Payer: PRIVATE HEALTH INSURANCE | Source: Ambulatory Visit | Attending: Nurse Practitioner | Admitting: Nurse Practitioner

## 2017-12-13 DIAGNOSIS — R229 Localized swelling, mass and lump, unspecified: Secondary | ICD-10-CM

## 2017-12-13 DIAGNOSIS — IMO0002 Reserved for concepts with insufficient information to code with codable children: Secondary | ICD-10-CM

## 2017-12-13 DIAGNOSIS — R1909 Other intra-abdominal and pelvic swelling, mass and lump: Secondary | ICD-10-CM

## 2018-01-22 ENCOUNTER — Ambulatory Visit: Payer: PRIVATE HEALTH INSURANCE | Admitting: Podiatry

## 2018-02-28 DIAGNOSIS — E782 Mixed hyperlipidemia: Secondary | ICD-10-CM

## 2018-02-28 DIAGNOSIS — I10 Essential (primary) hypertension: Secondary | ICD-10-CM

## 2018-02-28 DIAGNOSIS — Z794 Long term (current) use of insulin: Secondary | ICD-10-CM

## 2018-02-28 DIAGNOSIS — E1165 Type 2 diabetes mellitus with hyperglycemia: Secondary | ICD-10-CM

## 2018-03-25 ENCOUNTER — Other Ambulatory Visit: Payer: Self-pay | Admitting: Nurse Practitioner

## 2018-04-18 ENCOUNTER — Ambulatory Visit (INDEPENDENT_AMBULATORY_CARE_PROVIDER_SITE_OTHER): Payer: PRIVATE HEALTH INSURANCE | Admitting: Nurse Practitioner

## 2018-04-18 VITALS — BP 130/78 | HR 58 | Temp 97.9°F | Ht 63.0 in | Wt 145.8 lb

## 2018-04-18 DIAGNOSIS — Z23 Encounter for immunization: Secondary | ICD-10-CM | POA: Diagnosis not present

## 2018-05-30 ENCOUNTER — Encounter: Payer: Self-pay | Admitting: Nurse Practitioner

## 2018-05-30 ENCOUNTER — Ambulatory Visit (INDEPENDENT_AMBULATORY_CARE_PROVIDER_SITE_OTHER): Payer: PRIVATE HEALTH INSURANCE | Admitting: Nurse Practitioner

## 2018-05-30 VITALS — BP 128/72 | HR 68 | Temp 97.6°F | Ht 61.25 in | Wt 158.2 lb

## 2018-05-30 DIAGNOSIS — I10 Essential (primary) hypertension: Secondary | ICD-10-CM

## 2018-05-30 DIAGNOSIS — E119 Type 2 diabetes mellitus without complications: Secondary | ICD-10-CM | POA: Insufficient documentation

## 2018-05-30 DIAGNOSIS — Z23 Encounter for immunization: Secondary | ICD-10-CM

## 2018-05-30 DIAGNOSIS — Z794 Long term (current) use of insulin: Secondary | ICD-10-CM

## 2018-05-30 DIAGNOSIS — Z79899 Other long term (current) drug therapy: Secondary | ICD-10-CM | POA: Diagnosis not present

## 2018-05-30 NOTE — Progress Notes (Signed)
Subjective:     Patient ID: Shannon ErichsenSylvia A [name] , female    DOB: March 23, 1956 , 62 y.o.   MRN: [id number]   Chief Complaint  Patient presents with  . Diabetes    HPI  Diabetes  She presents for her follow-up diabetic visit. She has type 2 diabetes mellitus. Pertinent negatives for hypoglycemia include no confusion, dizziness, headaches or nervousness/anxiousness. Pertinent negatives for diabetes include no blurred vision, no chest pain, no fatigue, no polydipsia, no polyphagia and no polyuria. Current diabetic treatment includes oral agent (dual therapy). She is compliant with treatment all of the time. Her weight is decreasing steadily. She is following a generally unhealthy diet. When asked about meal planning, she reported none. She has not had a previous visit with a dietitian. She participates in exercise every other day. Her overall blood glucose range is 140-180 mg/dl. (She had one blood sugar of 178. ) An ACE inhibitor/angiotensin II receptor blocker is being taken. She does not see a podiatrist.    Past Medical History:  Diagnosis Date  . Diabetes mellitus without complication (HCC)      No family history on file.   Current Outpatient Medications:  .  Atorvastatin Calcium (LIPITOR PO), Take by mouth., Disp: , Rfl:  .  Insulin Detemir (LEVEMIR [place]), Inject into the skin., Disp: , Rfl:  .  LISINOPRIL PO, Take by mouth., Disp: , Rfl:  .  Semaglutide,0.25 or 0.5MG /DOS, (OZEMPIC, 0.25 OR 0.5 MG/DOSE,) 2 MG/1.5ML SOPN, Inject into the skin. Inject 0.5mg  subcutaneous route into the thigh, abdomen or upper arm on the same day each week rotating injection sites, Disp: , Rfl:  .  TRESIBA FLEXTOUCH 100 UNIT/ML SOPN FlexTouch Pen, INJECT 21 UNITS SUBCUTANEOUSLY AS PER INSULIN PROTOCOL, Disp: 5 pen, Rfl: 1 .  VITAMIN D, CHOLECALCIFEROL, PO, Take 1 tablet by mouth daily., Disp: , Rfl:    No Known Allergies   Review of Systems  Constitutional: Negative.  Negative for fatigue.  Eyes: Negative  for blurred vision.  Respiratory: Negative.   Cardiovascular: Negative.  Negative for chest pain.  Gastrointestinal: Negative.   Endocrine: Negative.  Negative for polydipsia, polyphagia and polyuria.  Skin: Negative.   Neurological: Negative.  Negative for dizziness and headaches.  Psychiatric/Behavioral: Negative for confusion. The patient is not nervous/anxious.      Today's Vitals   05/30/18 0948  BP: 128/72  Pulse: 68  Temp: 97.6 F (36.4 C)  TempSrc: Oral  SpO2: 96%  Weight: 158 lb 3.2 oz (71.8 kg)  Height: 5' 1.25" (1.556 m)  PainSc: 0-No pain   Body mass index is 29.65 kg/m.   Objective:  Physical Exam  Constitutional: She is oriented to person, place, and time. She appears well-developed and well-nourished.  Neck: Normal range of motion. Neck supple.  Cardiovascular: Normal rate, regular rhythm and normal heart sounds.  No murmur heard. Pulmonary/Chest: Effort normal and breath sounds normal. She exhibits no tenderness.  Musculoskeletal: Normal range of motion.  Neurological: She is alert and oriented to person, place, and time.  Skin: Skin is warm and dry. Capillary refill takes less than 2 seconds.  Psychiatric: She has a normal mood and affect.  Vitals reviewed.       Assessment And Plan:     1. Type 2 diabetes mellitus without complication, with long-term current use of insulin (HCC)  Chronic, controlled  Continue with current medications - Hemoglobin A1c  2. Essential hypertension . B/P is controlled.  Marland Kitchen. BMP ordered to check renal function.  .Marland Kitchen  The importance of regular exercise and dietary modification was stressed to the patient. Stressed importance of losing ten percent of her body weight to help with B/P control.  . The weight loss would help with decreasing cardiac and cancer risk as well.  - CMP14 + Anion Gap  3. Encounter for immunization  Pneumonia 23 administered in office - Pneumococcal polysaccharide vaccine 23-valent greater than or  equal to 2yo subcutaneous/IM  4. Other long term (current) drug therapy  - TSH        Arnette Felts, FNP

## 2018-05-30 NOTE — Patient Instructions (Signed)
Pneumococcal Polysaccharide Vaccine: What You Need to Know 1. Why get vaccinated? Vaccination can protect older adults (and some children and younger adults) from pneumococcal disease. Pneumococcal disease is caused by bacteria that can spread from person to person through close contact. It can cause ear infections, and it can also lead to more serious infections of the:  Lungs (pneumonia),  Blood (bacteremia), and  Covering of the brain and spinal cord (meningitis). Meningitis can cause deafness and brain damage, and it can be fatal.  Anyone can get pneumococcal disease, but children under 52 years of age, people with certain medical conditions, adults over 104 years of age, and cigarette smokers are at the highest risk. About 18,000 older adults die each year from pneumococcal disease in the [place]. Treatment of pneumococcal infections with penicillin and other drugs used to be more effective. But some strains of the disease have become resistant to these drugs. This makes prevention of the disease, through vaccination, even more important. 2. Pneumococcal polysaccharide vaccine (PPSV23) Pneumococcal polysaccharide vaccine (PPSV23) protects against 23 types of pneumococcal bacteria. It will not prevent all pneumococcal disease. PPSV23 is recommended for:  All adults 31 years of age and older,  Anyone 2 through 64 years of age with certain long-term health problems,  Anyone 2 through 63 years of age with a weakened immune system,  Adults 35 through 64 years of age who smoke cigarettes or have asthma.  Most people need only one dose of PPSV. A second dose is recommended for certain high-risk groups. People 73 and older should get a dose even if they have gotten one or more doses of the vaccine before they turned 65. Your healthcare provider can give you more information about these recommendations. Most healthy adults develop protection within 2 to 3 weeks of getting the shot. 3.  Some people should not get this vaccine  Anyone who has had a life-threatening allergic reaction to PPSV should not get another dose.  Anyone who has a severe allergy to any component of PPSV should not receive it. Tell your provider if you have any severe allergies.  Anyone who is moderately or severely ill when the shot is scheduled may be asked to wait until they recover before getting the vaccine. Someone with a mild illness can usually be vaccinated.  Children less than 64 years of age should not receive this vaccine.  There is no evidence that PPSV is harmful to either a pregnant woman or to her fetus. However, as a precaution, women who need the vaccine should be vaccinated before becoming pregnant, if possible. 4. Risks of a vaccine reaction With any medicine, including vaccines, there is a chance of side effects. These are usually mild and go away on their own, but serious reactions are also possible. About half of people who get PPSV have mild side effects, such as redness or pain where the shot is given, which go away within about two days. Less than 1 out of 100 people develop a fever, muscle aches, or more severe local reactions. Problems that could happen after any vaccine:  People sometimes faint after a medical procedure, including vaccination. Sitting or lying down for about 15 minutes can help prevent fainting, and injuries caused by a fall. Tell your doctor if you feel dizzy, or have vision changes or ringing in the ears.  Some people get severe pain in the shoulder and have difficulty moving the arm where a shot was given. This happens very rarely.  Any  medication can cause a severe allergic reaction. Such reactions from a vaccine are very rare, estimated at about 1 in a million doses, and would happen within a few minutes to a few hours after the vaccination. As with any medicine, there is a very remote chance of a vaccine causing a serious injury or death. The safety of  vaccines is always being monitored. For more information, visit: http://floyd.org/ 5. What if there is a serious reaction? What should I look for? Look for anything that concerns you, such as signs of a severe allergic reaction, very high fever, or unusual behavior. Signs of a severe allergic reaction can include hives, swelling of the face and throat, difficulty breathing, a fast heartbeat, dizziness, and weakness. These would usually start a few minutes to a few hours after the vaccination. What should I do? If you think it is a severe allergic reaction or other emergency that can't wait, call 9-1-1 or get to the nearest hospital. Otherwise, call your doctor. Afterward, the reaction should be reported to the Vaccine Adverse Event Reporting System (VAERS). Your doctor might file this report, or you can do it yourself through the VAERS web site at www.vaers.LAgents.no, or by calling 1-8480296050. VAERS does not give medical advice. 6. How can I learn more?  Ask your doctor. He or she can give you the vaccine package insert or suggest other sources of information.  Call your local or state health department.  Contact the Centers for Disease Control and Prevention (CDC): ? Call 725-688-9087 (1-800-CDC-INFO) or ? Visit CDC's website at PicCapture.uy CDC Pneumococcal Polysaccharide Vaccine VIS (10/16/13) This information is not intended to replace advice given to you by your health care provider. Make sure you discuss any questions you have with your health care provider. Document Released: 04/08/2006 Document Revised: 03/01/2016 Document Reviewed: 03/01/2016 Elsevier Interactive Patient Education  2017 Elsevier Inc. Diabetes Mellitus and Nutrition When you have diabetes (diabetes mellitus), it is very important to have healthy eating habits because your blood sugar (glucose) levels are greatly affected by what you eat and drink. Eating healthy foods in the appropriate amounts, at  about the same times every day, can help you:  Control your blood glucose.  Lower your risk of heart disease.  Improve your blood pressure.  Reach or maintain a healthy weight.  Every person with diabetes is different, and each person has different needs for a meal plan. Your health care provider may recommend that you work with a diet and nutrition specialist (dietitian) to make a meal plan that is best for you. Your meal plan may vary depending on factors such as:  The calories you need.  The medicines you take.  Your weight.  Your blood glucose, blood pressure, and cholesterol levels.  Your activity level.  Other health conditions you have, such as heart or kidney disease.  How do carbohydrates affect me? Carbohydrates affect your blood glucose level more than any other type of food. Eating carbohydrates naturally increases the amount of glucose in your blood. Carbohydrate counting is a method for keeping track of how many carbohydrates you eat. Counting carbohydrates is important to keep your blood glucose at a healthy level, especially if you use insulin or take certain oral diabetes medicines. It is important to know how many carbohydrates you can safely have in each meal. This is different for every person. Your dietitian can help you calculate how many carbohydrates you should have at each meal and for snack. Foods that contain carbohydrates include:  Bread, cereal, rice, pasta, and crackers.  Potatoes and corn.  Peas, beans, and lentils.  Milk and yogurt.  Fruit and juice.  Desserts, such as cakes, cookies, ice cream, and candy.  How does alcohol affect me? Alcohol can cause a sudden decrease in blood glucose (hypoglycemia), especially if you use insulin or take certain oral diabetes medicines. Hypoglycemia can be a life-threatening condition. Symptoms of hypoglycemia (sleepiness, dizziness, and confusion) are similar to symptoms of having too much alcohol. If your  health care provider says that alcohol is safe for you, follow these guidelines:  Limit alcohol intake to no more than 1 drink per day for nonpregnant women and 2 drinks per day for men. One drink equals 12 oz of beer, 5 oz of wine, or 1 oz of hard liquor.  Do not drink on an empty stomach.  Keep yourself hydrated with water, diet soda, or unsweetened iced tea.  Keep in mind that regular soda, juice, and other mixers may contain a lot of sugar and must be counted as carbohydrates.  What are tips for following this plan? Reading food labels  Start by checking the serving size on the label. The amount of calories, carbohydrates, fats, and other nutrients listed on the label are based on one serving of the food. Many foods contain more than one serving per package.  Check the total grams (g) of carbohydrates in one serving. You can calculate the number of servings of carbohydrates in one serving by dividing the total carbohydrates by 15. For example, if a food has 30 g of total carbohydrates, it would be equal to 2 servings of carbohydrates.  Check the number of grams (g) of saturated and trans fats in one serving. Choose foods that have low or no amount of these fats.  Check the number of milligrams (mg) of sodium in one serving. Most people should limit total sodium intake to less than 2,300 mg per day.  Always check the nutrition information of foods labeled as "low-fat" or "nonfat". These foods may be higher in added sugar or refined carbohydrates and should be avoided.  Talk to your dietitian to identify your daily goals for nutrients listed on the label. Shopping  Avoid buying canned, premade, or processed foods. These foods tend to be high in fat, sodium, and added sugar.  Shop around the outside edge of the grocery store. This includes fresh fruits and vegetables, bulk grains, fresh meats, and fresh dairy. Cooking  Use low-heat cooking methods, such as baking, instead of  high-heat cooking methods like deep frying.  Cook using healthy oils, such as olive, canola, or sunflower oil.  Avoid cooking with butter, cream, or high-fat meats. Meal planning  Eat meals and snacks regularly, preferably at the same times every day. Avoid going long periods of time without eating.  Eat foods high in fiber, such as fresh fruits, vegetables, beans, and whole grains. Talk to your dietitian about how many servings of carbohydrates you can eat at each meal.  Eat 4-6 ounces of lean protein each day, such as lean meat, chicken, fish, eggs, or tofu. 1 ounce is equal to 1 ounce of meat, chicken, or fish, 1 egg, or 1/4 cup of tofu.  Eat some foods each day that contain healthy fats, such as avocado, nuts, seeds, and fish. Lifestyle   Check your blood glucose regularly.  Exercise at least 30 minutes 5 or more days each week, or as told by your health care provider.  Take medicines as  told by your health care provider.  Do not use any products that contain nicotine or tobacco, such as cigarettes and e-cigarettes. If you need help quitting, ask your health care provider.  Work with a Veterinary surgeoncounselor or diabetes educator to identify strategies to manage stress and any emotional and social challenges. What are some questions to ask my health care provider?  Do I need to meet with a diabetes educator?  Do I need to meet with a dietitian?  What number can I call if I have questions?  When are the best times to check my blood glucose? Where to find more information:  American Diabetes Association: diabetes.org/food-and-fitness/food  Academy of Nutrition and Dietetics: https://www.vargas.com/www.eatright.org/resources/health/diseases-and-conditions/diabetes  [employer] Institute of Diabetes and Digestive and Kidney Diseases (NIH): FindJewelers.czwww.niddk.nih.gov/health-information/diabetes/overview/diet-eating-physical-activity Summary  A healthy meal plan will help you control your blood glucose and maintain a  healthy lifestyle.  Working with a diet and nutrition specialist (dietitian) can help you make a meal plan that is best for you.  Keep in mind that carbohydrates and alcohol have immediate effects on your blood glucose levels. It is important to count carbohydrates and to use alcohol carefully. This information is not intended to replace advice given to you by your health care provider. Make sure you discuss any questions you have with your health care provider. Document Released: 03/08/2005 Document Revised: 07/16/2016 Document Reviewed: 07/16/2016 Elsevier Interactive Patient Education  [employer]2018 Elsevier Inc.

## 2018-05-31 LAB — CMP14 + ANION GAP
ALT: 15 IU/L (ref 0–32)
AST: 12 IU/L (ref 0–40)
Albumin/Globulin Ratio: 1.8 (ref 1.2–2.2)
Albumin: 4.6 g/dL (ref 3.6–4.8)
Alkaline Phosphatase: 86 IU/L (ref 39–117)
Anion Gap: 16 mmol/L (ref 10.0–18.0)
BUN/Creatinine Ratio: 20 (ref 12–28)
BUN: 13 mg/dL (ref 8–27)
Bilirubin Total: 0.4 mg/dL (ref 0.0–1.2)
CO2: 22 mmol/L (ref 20–29)
Calcium: 9.3 mg/dL (ref 8.7–10.3)
Chloride: 103 mmol/L (ref 96–106)
Creatinine, Ser: 0.66 mg/dL (ref 0.57–1.00)
GFR calc Af Amer: 109 mL/min/{1.73_m2} (ref >59)
GFR calc non Af Amer: 94 mL/min/{1.73_m2} (ref >59)
Globulin, Total: 2.5 g/dL (ref 1.5–4.5)
Glucose: 140 mg/dL — ABNORMAL HIGH (ref 65–99)
Potassium: 4 mmol/L (ref 3.5–5.2)
Sodium: 141 mmol/L (ref 134–144)
Total Protein: 7.1 g/dL (ref 6.0–8.5)

## 2018-05-31 LAB — HEMOGLOBIN A1C
Est. average glucose Bld gHb Est-mCnc: 200 mg/dL
Hgb A1c MFr Bld: 8.6 % — ABNORMAL HIGH (ref 4.8–5.6)

## 2018-05-31 LAB — TSH: TSH: 0.948 u[IU]/mL (ref 0.450–4.500)

## 2018-06-15 ENCOUNTER — Encounter (HOSPITAL_COMMUNITY): Payer: Self-pay

## 2018-06-15 ENCOUNTER — Emergency Department (HOSPITAL_COMMUNITY)
Admission: EM | Admit: 2018-06-15 | Discharge: 2018-06-16 | Disposition: A | Discharge status: Home / Self Care | Payer: PRIVATE HEALTH INSURANCE | Source: Home / Self Care | Attending: Emergency Medicine | Admitting: Emergency Medicine

## 2018-06-15 ENCOUNTER — Emergency Department (HOSPITAL_COMMUNITY): Payer: PRIVATE HEALTH INSURANCE

## 2018-06-15 ENCOUNTER — Other Ambulatory Visit: Payer: Self-pay

## 2018-06-15 DIAGNOSIS — E119 Type 2 diabetes mellitus without complications: Secondary | ICD-10-CM | POA: Insufficient documentation

## 2018-06-15 DIAGNOSIS — N3090 Cystitis, unspecified without hematuria: Secondary | ICD-10-CM | POA: Insufficient documentation

## 2018-06-15 DIAGNOSIS — N10 Acute tubulo-interstitial nephritis: Secondary | ICD-10-CM | POA: Diagnosis not present

## 2018-06-15 DIAGNOSIS — I10 Essential (primary) hypertension: Secondary | ICD-10-CM

## 2018-06-15 DIAGNOSIS — Z794 Long term (current) use of insulin: Secondary | ICD-10-CM

## 2018-06-15 DIAGNOSIS — R109 Unspecified abdominal pain: Secondary | ICD-10-CM

## 2018-06-15 DIAGNOSIS — N390 Urinary tract infection, site not specified: Secondary | ICD-10-CM | POA: Diagnosis not present

## 2018-06-15 LAB — URINALYSIS, ROUTINE W REFLEX MICROSCOPIC
Bilirubin Urine: NEGATIVE
Glucose, UA: 50 mg/dL — AB
Hgb urine dipstick: NEGATIVE
Ketones, ur: NEGATIVE mg/dL
Leukocytes, UA: NEGATIVE
Nitrite: POSITIVE — AB
Protein, ur: NEGATIVE mg/dL
Specific Gravity, Urine: 1.003 — ABNORMAL LOW (ref 1.005–1.030)
pH: 6 (ref 5.0–8.0)

## 2018-06-15 LAB — BASIC METABOLIC PANEL
Anion gap: 13 (ref 5–15)
BUN: 11 mg/dL (ref 8–23)
CO2: 22 mmol/L (ref 22–32)
Calcium: 9.3 mg/dL (ref 8.9–10.3)
Chloride: 99 mmol/L (ref 98–111)
Creatinine, Ser: 0.77 mg/dL (ref 0.44–1.00)
GFR calc Af Amer: >60 mL/min (ref >60)
GFR calc non Af Amer: >60 mL/min (ref >60)
Glucose, Bld: 217 mg/dL — ABNORMAL HIGH (ref 70–99)
Potassium: 4.3 mmol/L (ref 3.5–5.1)
Sodium: 134 mmol/L — ABNORMAL LOW (ref 135–145)

## 2018-06-15 LAB — CBC
HCT: 37.9 % (ref 36.0–46.0)
Hemoglobin: 12.5 g/dL (ref 12.0–15.0)
MCH: 28.4 pg (ref 26.0–34.0)
MCHC: 33 g/dL (ref 30.0–36.0)
MCV: 86.1 fL (ref 80.0–100.0)
Platelets: 261 10*3/uL (ref 150–400)
RBC: 4.4 MIL/uL (ref 3.87–5.11)
RDW: 13.4 % (ref 11.5–15.5)
WBC: 7.9 10*3/uL (ref 4.0–10.5)
nRBC: 0 % (ref 0.0–0.2)

## 2018-06-15 MED ORDER — ONDANSETRON HCL 4 MG/2ML IJ SOLN
4.0000 mg | Freq: Once | INTRAMUSCULAR | Status: AC
  Administered 2018-06-15: 4 mg via INTRAVENOUS
  Filled 2018-06-15: qty 2

## 2018-06-15 MED ORDER — SODIUM CHLORIDE 0.9 % IV SOLN: INTRAVENOUS | Status: DC

## 2018-06-15 MED ORDER — CEPHALEXIN 500 MG PO CAPS: 500.0000 mg | ORAL_CAPSULE | Freq: Four times a day (QID) | ORAL | 0 refills | Status: DC

## 2018-06-15 MED ORDER — MORPHINE SULFATE (PF) 4 MG/ML IV SOLN
4.0000 mg | Freq: Once | INTRAVENOUS | Status: AC
  Administered 2018-06-15: 4 mg via INTRAVENOUS
  Filled 2018-06-15: qty 1

## 2018-06-15 MED ORDER — HYDROCODONE-ACETAMINOPHEN 5-325 MG PO TABS: 1.0000 | ORAL_TABLET | ORAL | 0 refills | Status: DC | PRN

## 2018-06-15 MED ORDER — SODIUM CHLORIDE 0.9 % IV BOLUS
1000.0000 mL | Freq: Once | INTRAVENOUS | Status: AC
  Administered 2018-06-15: 1000 mL via INTRAVENOUS

## 2018-06-15 MED ORDER — ONDANSETRON 4 MG PO TBDP: 4.0000 mg | ORAL_TABLET | Freq: Three times a day (TID) | ORAL | 0 refills | Status: DC | PRN

## 2018-06-15 MED ORDER — SODIUM CHLORIDE 0.9 % IV SOLN
1.0000 g | Freq: Once | INTRAVENOUS | Status: AC
  Administered 2018-06-15: 1 g via INTRAVENOUS
  Filled 2018-06-15: qty 10

## 2018-06-15 NOTE — ED Triage Notes (Signed)
Pt reports that she has been having pain with urination, bilateral flank, and pelvic pain that started x 3 days. Pt states that she was seen at Urgent care yesterday and was placed on cipro. Pt states that her symptoms have gotten worse.

## 2018-06-15 NOTE — ED Provider Notes (Signed)
[facility]-EMERGENCY DEPT Provider Note   CSN: [id number] Arrival date & time: 06/15/18  2117     History   Chief Complaint Chief Complaint  Patient presents with  . Flank Pain  . Dysuria    HPI Shannon [name] is a 62 y.o. female.  Pt presents to the ED today with left sided flank pain.  The pt said she's had it for several days.  She went to urgent care and was told she had a UTI.  The pt was put on cipro.  She continues to have left sided pain and does not feel like she is getting any better.     Past Medical History:  Diagnosis Date  . Diabetes mellitus without complication [facility](HCC)     Patient Active Problem List   Diagnosis Date Noted  . Type 2 diabetes mellitus without complication, with long-term current use of insulin (HCC) 05/30/2018  . Essential hypertension 05/30/2018    Past Surgical History:  Procedure Laterality Date  . ABDOMINAL HYSTERECTOMY    . CESAREAN SECTION    . MYOMECTOMY       OB History   No obstetric history on file.      Home Medications    Prior to Admission medications   Medication Sig Start Date End Date Taking? Authorizing Provider  Atorvastatin Calcium (LIPITOR PO) Take by mouth.    [provider]  cephALEXin (KEFLEX) 500 MG capsule Take 1 capsule (500 mg total) by mouth 4 (four) times daily. 06/15/18   Jacalyn LefevreHaviland, Markus Casten, MD  HYDROcodone-acetaminophen (NORCO/VICODIN) 5-325 MG tablet Take 1 tablet by mouth every 4 (four) hours as needed. 06/15/18   Jacalyn LefevreHaviland, Brayli Klingbeil, MD  Insulin Detemir (LEVEMIR Onycha) Inject into the skin.    [provider]  LISINOPRIL PO Take by mouth.    [provider]  ondansetron (ZOFRAN ODT) 4 MG disintegrating tablet Take 1 tablet (4 mg total) by mouth every 8 (eight) hours as needed. 06/15/18   Jacalyn LefevreHaviland, Antwonette Feliz, MD  Semaglutide,0.25 or 0.5MG /DOS, (OZEMPIC, 0.25 OR 0.5 MG/DOSE,) 2 MG/1.5ML SOPN Inject into the skin. Inject 0.5mg  subcutaneous route into the thigh,  abdomen or upper arm on the same day each week rotating injection sites    [provider]  TRESIBA FLEXTOUCH 100 UNIT/ML SOPN FlexTouch Pen INJECT 21 UNITS SUBCUTANEOUSLY AS PER INSULIN PROTOCOL 04/10/18   Arnette FeltsMoore, Janece, FNP  VITAMIN D, CHOLECALCIFEROL, PO Take 1 tablet by mouth daily.    [provider]    Family History History reviewed. No pertinent family history.  Social History Social History   Tobacco Use  . Smoking status: Never Smoker  . Smokeless tobacco: Never Used  Substance Use Topics  . Alcohol use: Yes  . Drug use: No     Allergies   Patient has no known allergies.   Review of Systems Review of Systems  Gastrointestinal: Positive for nausea.  Genitourinary: Positive for flank pain.  All other systems reviewed and are negative.    Physical Exam Updated Vital Signs BP (!) 166/77 (BP Location: Left Arm)   Pulse 86   Temp 98 F (36.7 C) (Oral)   Resp 20   Ht 5\' 3"  (1.6 m)   Wt 71.2 kg   SpO2 98%   BMI 27.81 kg/m   Physical Exam Vitals signs and nursing note reviewed.  Constitutional:      Appearance: Normal appearance.  HENT:     Head: Normocephalic and atraumatic.     Right Ear: External  ear normal.     Left Ear: External ear normal.     Nose: Nose normal.     Mouth/Throat:     Mouth: Mucous membranes are moist.     Pharynx: Oropharynx is clear.  Eyes:     Extraocular Movements: Extraocular movements intact.     Conjunctiva/sclera: Conjunctivae normal.     Pupils: Pupils are equal, round, and reactive to light.  Neck:     Musculoskeletal: Normal range of motion and neck supple.  Cardiovascular:     Rate and Rhythm: Normal rate and regular rhythm.     Pulses: Normal pulses.     Heart sounds: Normal heart sounds.  Pulmonary:     Effort: Pulmonary effort is normal.     Breath sounds: Normal breath sounds.  Abdominal:     General: Abdomen is flat. Bowel sounds are normal.     Palpations: Abdomen is soft.    Musculoskeletal: Normal range of motion.  Skin:    General: Skin is warm.     Capillary Refill: Capillary refill takes less than 2 seconds.  Neurological:     General: No focal deficit present.     Mental Status: She is alert and oriented to person, place, and time.  Psychiatric:        Mood and Affect: Mood normal.        Behavior: Behavior normal.        Thought Content: Thought content normal.        Judgment: Judgment normal.      ED Treatments / Results  Labs (all labs ordered are listed, but only abnormal results are displayed) Labs Reviewed  URINALYSIS, ROUTINE W REFLEX MICROSCOPIC - Abnormal; Notable for the following components:      Result Value   Color, Urine AMBER (*)    Specific Gravity, Urine 1.003 (*)    Glucose, UA 50 (*)    Nitrite POSITIVE (*)    Bacteria, UA MANY (*)    All other components within normal limits  BASIC METABOLIC PANEL - Abnormal; Notable for the following components:   Sodium 134 (*)    Glucose, Bld 217 (*)    All other components within normal limits  CBC    EKG None  Radiology Ct Renal Stone Study  Result Date: 06/15/2018 CLINICAL DATA:  Acute onset of right flank pain and generalized abdominal pain. EXAM: CT ABDOMEN AND PELVIS WITHOUT CONTRAST TECHNIQUE: Multidetector CT imaging of the abdomen and pelvis was performed following the standard protocol without IV contrast. COMPARISON:  None. FINDINGS: Lower chest: Minimal scarring is noted at the lung bases. The visualized portions of the mediastinum are unremarkable. Hepatobiliary: The liver is unremarkable in appearance. The gallbladder is unremarkable in appearance. The common bile duct remains normal in caliber. Pancreas: The pancreas is within normal limits. Spleen: The spleen is unremarkable in appearance. Adrenals/Urinary Tract: The adrenal glands are unremarkable in appearance. The kidneys are within normal limits. There is no evidence of hydronephrosis. No renal or ureteral  stones are identified. No perinephric stranding is seen. Stomach/Bowel: The stomach is unremarkable in appearance. The small bowel is within normal limits. The appendix is normal in caliber, without evidence of appendicitis. The colon is unremarkable in appearance. Vascular/Lymphatic: Scattered calcification is seen along the abdominal aorta and its branches. The abdominal aorta is otherwise grossly unremarkable. The inferior vena cava is grossly unremarkable. No retroperitoneal lymphadenopathy is seen. No pelvic sidewall lymphadenopathy is identified. Reproductive: The bladder is mildly distended and grossly  unremarkable. The patient is status post hysterectomy. No suspicious adnexal masses are seen. Other: No additional soft tissue abnormalities are seen. Musculoskeletal: No acute osseous abnormalities are identified. The visualized musculature is unremarkable in appearance. IMPRESSION: No acute abnormality seen within the abdomen or pelvis. Aortic Atherosclerosis (ICD10-I70.0). Electronically Signed   By: Roanna RaiderJeffery  Chang M.D.   On: 06/15/2018 22:47    Procedures Procedures (including critical care time)  Medications Ordered in ED Medications  sodium chloride 0.9 % bolus 1,000 mL (1,000 mLs Intravenous New Bag/Given 06/15/18 2219)    And  0.9 %  sodium chloride infusion (has no administration in time range)  cefTRIAXone (ROCEPHIN) 1 g in sodium chloride 0.9 % 100 mL IVPB (has no administration in time range)  morphine 4 MG/ML injection 4 mg (4 mg Intravenous Given 06/15/18 2218)  ondansetron (ZOFRAN) injection 4 mg (4 mg Intravenous Given 06/15/18 2218)     Initial Impression / Assessment and Plan / ED Course  I have reviewed the triage vital signs and the nursing notes.  Pertinent labs & imaging results that were available during my care of the patient were reviewed by me and considered in my medical decision making (see chart for details).    Pt is feeling much better after IVFs and  morphine/zofran.  She still has a UTI and has been on cipro for several days.  She will be given a dose of IV rocephin prior to d/c and told to start keflex tomorrow and stop cipro.  She is also given a rx for lortab/zofran.  Return if worse.  F/u with pcp.  Final Clinical Impressions(s) / ED Diagnoses   Final diagnoses:  Flank pain  Cystitis    ED Discharge Orders         Ordered    cephALEXin (KEFLEX) 500 MG capsule  4 times daily     06/15/18 2303    HYDROcodone-acetaminophen (NORCO/VICODIN) 5-325 MG tablet  Every 4 hours PRN     06/15/18 2303    ondansetron (ZOFRAN ODT) 4 MG disintegrating tablet  Every 8 hours PRN     06/15/18 2303           Jacalyn LefevreHaviland, Ramla Hase, MD 06/15/18 2305

## 2018-06-15 NOTE — Discharge Instructions (Signed)
Stop Cipro

## 2018-06-17 ENCOUNTER — Encounter (HOSPITAL_COMMUNITY): Payer: Self-pay | Admitting: *Deleted

## 2018-06-17 ENCOUNTER — Inpatient Hospital Stay (HOSPITAL_COMMUNITY)
Admission: EM | Admit: 2018-06-17 | Discharge: 2018-06-21 | DRG: 690 | Disposition: A | Discharge status: Home / Self Care | Payer: PRIVATE HEALTH INSURANCE | Source: Ambulatory Visit | Attending: Internal Medicine | Admitting: Internal Medicine

## 2018-06-17 ENCOUNTER — Other Ambulatory Visit: Payer: Self-pay

## 2018-06-17 ENCOUNTER — Ambulatory Visit (INDEPENDENT_AMBULATORY_CARE_PROVIDER_SITE_OTHER)
Admission: EM | Admit: 2018-06-17 | Discharge: 2018-06-17 | Disposition: A | Discharge status: Home / Self Care | Payer: PRIVATE HEALTH INSURANCE | Source: Home / Self Care

## 2018-06-17 DIAGNOSIS — N12 Tubulo-interstitial nephritis, not specified as acute or chronic: Secondary | ICD-10-CM

## 2018-06-17 DIAGNOSIS — I10 Essential (primary) hypertension: Secondary | ICD-10-CM | POA: Diagnosis present

## 2018-06-17 DIAGNOSIS — E7800 Pure hypercholesterolemia, unspecified: Secondary | ICD-10-CM | POA: Diagnosis present

## 2018-06-17 DIAGNOSIS — E785 Hyperlipidemia, unspecified: Secondary | ICD-10-CM | POA: Diagnosis not present

## 2018-06-17 DIAGNOSIS — Z87440 Personal history of urinary (tract) infections: Secondary | ICD-10-CM

## 2018-06-17 DIAGNOSIS — N10 Acute tubulo-interstitial nephritis: Principal | ICD-10-CM | POA: Diagnosis present

## 2018-06-17 DIAGNOSIS — E871 Hypo-osmolality and hyponatremia: Secondary | ICD-10-CM | POA: Diagnosis present

## 2018-06-17 DIAGNOSIS — Z8249 Family history of ischemic heart disease and other diseases of the circulatory system: Secondary | ICD-10-CM

## 2018-06-17 DIAGNOSIS — Z90710 Acquired absence of both cervix and uterus: Secondary | ICD-10-CM

## 2018-06-17 DIAGNOSIS — E860 Dehydration: Secondary | ICD-10-CM | POA: Diagnosis present

## 2018-06-17 DIAGNOSIS — E119 Type 2 diabetes mellitus without complications: Secondary | ICD-10-CM | POA: Diagnosis not present

## 2018-06-17 DIAGNOSIS — Z79899 Other long term (current) drug therapy: Secondary | ICD-10-CM

## 2018-06-17 DIAGNOSIS — Z794 Long term (current) use of insulin: Secondary | ICD-10-CM

## 2018-06-17 DIAGNOSIS — E876 Hypokalemia: Secondary | ICD-10-CM | POA: Diagnosis present

## 2018-06-17 DIAGNOSIS — Z833 Family history of diabetes mellitus: Secondary | ICD-10-CM

## 2018-06-17 DIAGNOSIS — Z87891 Personal history of nicotine dependence: Secondary | ICD-10-CM

## 2018-06-17 DIAGNOSIS — E1165 Type 2 diabetes mellitus with hyperglycemia: Secondary | ICD-10-CM | POA: Diagnosis present

## 2018-06-17 HISTORY — DX: Tubulo-interstitial nephritis, not specified as acute or chronic: N12

## 2018-06-17 HISTORY — DX: Essential (primary) hypertension: I10

## 2018-06-17 HISTORY — DX: Pure hypercholesterolemia, unspecified: E78.00

## 2018-06-17 LAB — URINALYSIS, ROUTINE W REFLEX MICROSCOPIC
Bilirubin Urine: NEGATIVE
Glucose, UA: NEGATIVE mg/dL
Hgb urine dipstick: NEGATIVE
Ketones, ur: NEGATIVE mg/dL
Leukocytes, UA: NEGATIVE
Nitrite: NEGATIVE
Protein, ur: NEGATIVE mg/dL
Specific Gravity, Urine: 1.003 — ABNORMAL LOW (ref 1.005–1.030)
pH: 7 (ref 5.0–8.0)

## 2018-06-17 LAB — CBC WITH DIFFERENTIAL/PLATELET
Abs Immature Granulocytes: 0.01 10*3/uL (ref 0.00–0.07)
Basophils Absolute: 0 10*3/uL (ref 0.0–0.1)
Basophils Relative: 1 %
Eosinophils Absolute: 0 10*3/uL (ref 0.0–0.5)
Eosinophils Relative: 1 %
HCT: 38 % (ref 36.0–46.0)
Hemoglobin: 12.5 g/dL (ref 12.0–15.0)
Immature Granulocytes: 0 %
Lymphocytes Relative: 27 %
Lymphs Abs: 1.7 10*3/uL (ref 0.7–4.0)
MCH: 27.9 pg (ref 26.0–34.0)
MCHC: 32.9 g/dL (ref 30.0–36.0)
MCV: 84.8 fL (ref 80.0–100.0)
Monocytes Absolute: 0.5 10*3/uL (ref 0.1–1.0)
Monocytes Relative: 8 %
Neutro Abs: 4 10*3/uL (ref 1.7–7.7)
Neutrophils Relative %: 63 %
Platelets: 300 10*3/uL (ref 150–400)
RBC: 4.48 MIL/uL (ref 3.87–5.11)
RDW: 13.1 % (ref 11.5–15.5)
WBC: 6.4 10*3/uL (ref 4.0–10.5)
nRBC: 0 % (ref 0.0–0.2)

## 2018-06-17 LAB — COMPREHENSIVE METABOLIC PANEL
ALT: 18 U/L (ref 0–44)
AST: 16 U/L (ref 15–41)
Albumin: 3.9 g/dL (ref 3.5–5.0)
Alkaline Phosphatase: 73 U/L (ref 38–126)
Anion gap: 11 (ref 5–15)
BUN: 10 mg/dL (ref 8–23)
CO2: 25 mmol/L (ref 22–32)
Calcium: 9.2 mg/dL (ref 8.9–10.3)
Chloride: 96 mmol/L — ABNORMAL LOW (ref 98–111)
Creatinine, Ser: 0.58 mg/dL (ref 0.44–1.00)
GFR calc Af Amer: >60 mL/min (ref >60)
GFR calc non Af Amer: >60 mL/min (ref >60)
Glucose, Bld: 173 mg/dL — ABNORMAL HIGH (ref 70–99)
Potassium: 3.1 mmol/L — ABNORMAL LOW (ref 3.5–5.1)
Sodium: 132 mmol/L — ABNORMAL LOW (ref 135–145)
Total Bilirubin: 0.8 mg/dL (ref 0.3–1.2)
Total Protein: 7.4 g/dL (ref 6.5–8.1)

## 2018-06-17 LAB — POCT URINALYSIS DIP (DEVICE)
Bilirubin Urine: NEGATIVE
Glucose, UA: 100 mg/dL — AB
Leukocytes, UA: NEGATIVE
Nitrite: POSITIVE — AB
Protein, ur: NEGATIVE mg/dL
Specific Gravity, Urine: 1.015 (ref 1.005–1.030)
Urobilinogen, UA: 1 mg/dL (ref 0.0–1.0)
pH: 6.5 (ref 5.0–8.0)

## 2018-06-17 LAB — GLUCOSE, CAPILLARY
Glucose-Capillary: 104 mg/dL — ABNORMAL HIGH (ref 70–99)
Glucose-Capillary: 141 mg/dL — ABNORMAL HIGH (ref 70–99)

## 2018-06-17 LAB — POCT I-STAT, CHEM 8
BUN: 11 mg/dL (ref 8–23)
Calcium, Ion: 1.14 mmol/L — ABNORMAL LOW (ref 1.15–1.40)
Chloride: 94 mmol/L — ABNORMAL LOW (ref 98–111)
Creatinine, Ser: 0.5 mg/dL (ref 0.44–1.00)
Glucose, Bld: 183 mg/dL — ABNORMAL HIGH (ref 70–99)
HCT: 41 % (ref 36.0–46.0)
Hemoglobin: 13.9 g/dL (ref 12.0–15.0)
Potassium: 3.7 mmol/L (ref 3.5–5.1)
Sodium: 132 mmol/L — ABNORMAL LOW (ref 135–145)
TCO2: 28 mmol/L (ref 22–32)

## 2018-06-17 LAB — LIPASE, BLOOD: Lipase: 22 U/L (ref 11–51)

## 2018-06-17 MED ORDER — ONDANSETRON HCL 4 MG/2ML IJ SOLN
4.0000 mg | Freq: Four times a day (QID) | INTRAMUSCULAR | Status: DC | PRN
  Administered 2018-06-18 (×2): 4 mg via INTRAVENOUS
  Filled 2018-06-17 (×2): qty 2

## 2018-06-17 MED ORDER — SODIUM CHLORIDE 0.9 % IV SOLN
1.0000 g | INTRAVENOUS | Status: DC
  Administered 2018-06-18 – 2018-06-20 (×3): 1 g via INTRAVENOUS
  Filled 2018-06-17 (×4): qty 10

## 2018-06-17 MED ORDER — ONDANSETRON HCL 4 MG PO TABS
4.0000 mg | ORAL_TABLET | Freq: Four times a day (QID) | ORAL | Status: DC | PRN
  Administered 2018-06-19: 4 mg via ORAL
  Filled 2018-06-17: qty 1

## 2018-06-17 MED ORDER — MORPHINE SULFATE (PF) 2 MG/ML IV SOLN
2.0000 mg | INTRAVENOUS | Status: DC | PRN
  Administered 2018-06-17 – 2018-06-19 (×8): 4 mg via INTRAVENOUS
  Administered 2018-06-19: 2 mg via INTRAVENOUS
  Administered 2018-06-19: 4 mg via INTRAVENOUS
  Filled 2018-06-17: qty 2
  Filled 2018-06-17: qty 1
  Filled 2018-06-17 (×9): qty 2

## 2018-06-17 MED ORDER — HYDROCODONE-ACETAMINOPHEN 5-325 MG PO TABS
1.0000 | ORAL_TABLET | ORAL | Status: DC | PRN
  Administered 2018-06-18 – 2018-06-21 (×10): 1 via ORAL
  Filled 2018-06-17 (×10): qty 1

## 2018-06-17 MED ORDER — INSULIN ASPART 100 UNIT/ML ~~LOC~~ SOLN
0.0000 [IU] | Freq: Three times a day (TID) | SUBCUTANEOUS | Status: DC
  Administered 2018-06-18 – 2018-06-19 (×4): 2 [IU] via SUBCUTANEOUS
  Administered 2018-06-19 – 2018-06-20 (×3): 1 [IU] via SUBCUTANEOUS
  Administered 2018-06-20 – 2018-06-21 (×2): 3 [IU] via SUBCUTANEOUS
  Administered 2018-06-21: 2 [IU] via SUBCUTANEOUS

## 2018-06-17 MED ORDER — ENOXAPARIN SODIUM 40 MG/0.4ML ~~LOC~~ SOLN
40.0000 mg | SUBCUTANEOUS | Status: DC
  Administered 2018-06-17 – 2018-06-20 (×4): 40 mg via SUBCUTANEOUS
  Filled 2018-06-17 (×5): qty 0.4

## 2018-06-17 MED ORDER — FENTANYL CITRATE (PF) 100 MCG/2ML IJ SOLN
50.0000 ug | Freq: Once | INTRAMUSCULAR | Status: AC
  Administered 2018-06-17: 50 ug via INTRAVENOUS
  Filled 2018-06-17: qty 2

## 2018-06-17 MED ORDER — SODIUM CHLORIDE 0.9 % IV SOLN
1.0000 g | Freq: Once | INTRAVENOUS | Status: AC
  Administered 2018-06-17: 1 g via INTRAVENOUS
  Filled 2018-06-17: qty 10

## 2018-06-17 MED ORDER — ONDANSETRON HCL 4 MG/2ML IJ SOLN
4.0000 mg | Freq: Once | INTRAMUSCULAR | Status: AC
  Administered 2018-06-17: 4 mg via INTRAVENOUS
  Filled 2018-06-17: qty 2

## 2018-06-17 MED ORDER — SODIUM CHLORIDE 0.9 % IV SOLN
INTRAVENOUS | Status: AC
  Administered 2018-06-17 – 2018-06-18 (×2): via INTRAVENOUS

## 2018-06-17 MED ORDER — DOCUSATE SODIUM 100 MG PO CAPS
200.0000 mg | ORAL_CAPSULE | Freq: Every day | ORAL | Status: DC
  Administered 2018-06-18 – 2018-06-20 (×3): 200 mg via ORAL
  Filled 2018-06-17 (×3): qty 2

## 2018-06-17 MED ORDER — POTASSIUM CHLORIDE CRYS ER 20 MEQ PO TBCR
40.0000 meq | EXTENDED_RELEASE_TABLET | Freq: Once | ORAL | Status: AC
  Administered 2018-06-17: 40 meq via ORAL
  Filled 2018-06-17: qty 2

## 2018-06-17 NOTE — Plan of Care (Signed)

## 2018-06-17 NOTE — H&P (Signed)
History and Physical    Shannon ErichsenSylvia A Nickell ZOX:[id number] DOB: 25-Feb-1956 DOA: 06/17/2018  I have briefly reviewed the patient[facility]  PCP: Dorothyann PengSanders, Robyn, MD  Patient coming from: home  Chief Complaint: Back pain, nausea and vomiting  HPI: Shannon ErichsenSylvia A [name] is a 62 y.o. female with medical history significant of type 2 diabetes mellitus, hyperlipidemia, hypertension who presents to the hospital with persistent nausea vomiting as well as bilateral flank pain right more than the left.  Patient has been having UTI type symptoms with burning with urination as well as flank pain over the last 3 to 4 days, she was evaluated in the emergency room 2 days ago and was given antibiotics, however despite taking oral antibiotics her symptoms have progressed, she is feeling sicker, she has been having profuse nausea and vomiting and inability to keep any oral intake and decided to come back to the emergency room.  She is also been complaining of worsening bilateral flank pain right more than left.  She is complained of subjective fever and chills.  She is also complaining of bilateral inguinal pain, worse on the left.  Denies any chest pain, denies any palpitations.  Her diabetes is generally well controlled however in the last couple days her CBGs have run higher than her normal self  ED Course: In the emergency room patient is afebrile, otherwise her vital signs are stable.  Blood work reveals normal renal function, normal CBC.  K is 3.1, sodium 132.  She underwent a CT renal stone study 2 days ago on her prior ED visit did not show anything acute.  Review of Systems: As per HPI otherwise 10 point review of systems negative.   Past Medical History:  Diagnosis Date  . Diabetes mellitus without complication (HCC)   . Hypercholesteremia     Past Surgical History:  Procedure Laterality Date  . ABDOMINAL HYSTERECTOMY    . CESAREAN SECTION    . MYOMECTOMY       reports that  she has quit smoking. She has never used smokeless tobacco. She reports current alcohol use. She reports that she does not use drugs.  No Known Allergies  Family History  Problem Relation Age of Onset  . Hypertension Mother   . Diabetes Mother   . Diabetes Father   . Hypertension Father   . Hypertension Son   . Diabetes Son     Prior to Admission medications   Medication Sig Start Date End Date Taking? Authorizing Provider  acetaminophen (TYLENOL) 500 MG tablet Take 500-1,000 mg by mouth every 6 (six) hours as needed for headache (or pain).   Yes [provider]  atorvastatin (LIPITOR) 20 MG tablet Take 20 mg by mouth daily.   Yes [provider]  cephALEXin (KEFLEX) 500 MG capsule Take 1 capsule (500 mg total) by mouth 4 (four) times daily. Patient taking differently: Take 500 mg by mouth 4 (four) times daily. For 7 days 06/15/18  Yes Jacalyn LefevreHaviland, Julie, MD  Cholecalciferol (VITAMIN D-3 PO) Take 1 capsule by mouth daily.   Yes [provider]  HYDROcodone-acetaminophen (NORCO/VICODIN) 5-325 MG tablet Take 1 tablet by mouth every 4 (four) hours as needed. Patient taking differently: Take 1 tablet by mouth every 4 (four) hours as needed (for pain).  06/15/18  Yes Jacalyn LefevreHaviland, Julie, MD  lisinopril (PRINIVIL,ZESTRIL) 10 MG tablet Take 10 mg by mouth daily.   Yes [provider]  ondansetron (ZOFRAN ODT) 4 MG disintegrating tablet  Take 1 tablet (4 mg total) by mouth every 8 (eight) hours as needed. Patient taking differently: Take 4 mg by mouth every 8 (eight) hours as needed for nausea or vomiting.  06/15/18  Yes Jacalyn Lefevre, MD  Semaglutide,0.25 or 0.5MG /DOS, (OZEMPIC, 0.25 OR 0.5 MG/DOSE,) 2 MG/1.5ML SOPN Inject 0.5 mg into the skin every Monday. Into the thigh, abdomen or upper arm on the same day each week rotating injection sites   Yes [provider]  TRESIBA FLEXTOUCH 100 UNIT/ML SOPN FlexTouch Pen INJECT 21 UNITS SUBCUTANEOUSLY AS PER  INSULIN PROTOCOL Patient taking differently: Inject 21 Units into the skin daily after breakfast.  04/10/18  Yes Arnette Felts, FNP    Physical Exam: Vitals:   06/17/18 1234 06/17/18 1237  BP:  (!) 171/79  Pulse: 75   Resp: 14   Temp: 98.2 F (36.8 C)   TempSrc: Oral   SpO2: 100%   Weight:  71.2 kg  Height:  5\' 3"  (1.6 m)      Constitutional: NAD, calm, comfortable Eyes: PERRL, lids and conjunctivae normal ENMT: Mucous membranes are moist. Neck: normal, supple Respiratory: clear to auscultation bilaterally, no wheezing, no crackles. Normal respiratory effort. No accessory muscle use.  Cardiovascular: Regular rate and rhythm, no murmurs / rubs / gallops. No extremity edema. 2+ pedal pulses.  Abdomen: no tenderness, no masses palpated. Bowel sounds positive.  Positive CVA tenderness bilateral Musculoskeletal: no clubbing / cyanosis. Normal muscle tone.  Skin: no rashes, lesions, ulcers. No induration Neurologic: CN 2-12 grossly intact. Strength 5/5 in all 4.  Psychiatric: Normal judgment and insight. Alert and oriented x 3. Normal mood.   Labs on Admission: I have personally reviewed following labs and imaging studies  CBC: Recent Labs  Lab 06/15/18 2221 06/17/18 1207 06/17/18 1241  WBC 7.9  --  6.4  NEUTROABS  --   --  4.0  HGB 12.5 13.9 12.5  HCT 37.9 41.0 38.0  MCV 86.1  --  84.8  PLT 261  --  300   Basic Metabolic Panel: Recent Labs  Lab 06/15/18 2221 06/17/18 1207 06/17/18 1241  NA 134* 132* 132*  K 4.3 3.7 3.1*  CL 99 94* 96*  CO2 22  --  25  GLUCOSE 217* 183* 173*  BUN 11 11 10   CREATININE 0.77 0.50 0.58  CALCIUM 9.3  --  9.2   GFR: Estimated Creatinine Clearance: 67.2 mL/min (by C-G formula based on SCr of 0.58 mg/dL). Liver Function Tests: Recent Labs  Lab 06/17/18 1241  AST 16  ALT 18  ALKPHOS 73  BILITOT 0.8  PROT 7.4  ALBUMIN 3.9   Recent Labs  Lab 06/17/18 1241  LIPASE 22   No results for input(s): AMMONIA in the last 168  hours. Coagulation Profile: No results for input(s): INR, PROTIME in the last 168 hours. Cardiac Enzymes: No results for input(s): CKTOTAL, CKMB, CKMBINDEX, TROPONINI in the last 168 hours. BNP (last 3 results) No results for input(s): PROBNP in the last 8760 hours. HbA1C: No results for input(s): HGBA1C in the last 72 hours. CBG: No results for input(s): GLUCAP in the last 168 hours. Lipid Profile: No results for input(s): CHOL, HDL, LDLCALC, TRIG, CHOLHDL, LDLDIRECT in the last 72 hours. Thyroid Function Tests: No results for input(s): TSH, T4TOTAL, FREET4, T3FREE, THYROIDAB in the last 72 hours. Anemia Panel: No results for input(s): VITAMINB12, FOLATE, FERRITIN, TIBC, IRON, RETICCTPCT in the last 72 hours. Urine analysis:    Component Value Date/Time   COLORURINE AMBER (  A) 06/15/2018 2141   APPEARANCEUR CLEAR 06/15/2018 2141   LABSPEC 1.015 06/17/2018 1151   PHURINE 6.5 06/17/2018 1151   GLUCOSEU 100 (A) 06/17/2018 1151   HGBUR TRACE (A) 06/17/2018 1151   BILIRUBINUR NEGATIVE 06/17/2018 1151   KETONESUR TRACE (A) 06/17/2018 1151   PROTEINUR NEGATIVE 06/17/2018 1151   UROBILINOGEN 1.0 06/17/2018 1151   NITRITE POSITIVE (A) 06/17/2018 1151   LEUKOCYTESUR NEGATIVE 06/17/2018 1151     Radiological Exams on Admission: Ct Renal Stone Study  Result Date: 06/15/2018 CLINICAL DATA:  Acute onset of right flank pain and generalized abdominal pain. EXAM: CT ABDOMEN AND PELVIS WITHOUT CONTRAST TECHNIQUE: Multidetector CT imaging of the abdomen and pelvis was performed following the standard protocol without IV contrast. COMPARISON:  None. FINDINGS: Lower chest: Minimal scarring is noted at the lung bases. The visualized portions of the mediastinum are unremarkable. Hepatobiliary: The liver is unremarkable in appearance. The gallbladder is unremarkable in appearance. The common bile duct remains normal in caliber. Pancreas: The pancreas is within normal limits. Spleen: The spleen is  unremarkable in appearance. Adrenals/Urinary Tract: The adrenal glands are unremarkable in appearance. The kidneys are within normal limits. There is no evidence of hydronephrosis. No renal or ureteral stones are identified. No perinephric stranding is seen. Stomach/Bowel: The stomach is unremarkable in appearance. The small bowel is within normal limits. The appendix is normal in caliber, without evidence of appendicitis. The colon is unremarkable in appearance. Vascular/Lymphatic: Scattered calcification is seen along the abdominal aorta and its branches. The abdominal aorta is otherwise grossly unremarkable. The inferior vena cava is grossly unremarkable. No retroperitoneal lymphadenopathy is seen. No pelvic sidewall lymphadenopathy is identified. Reproductive: The bladder is mildly distended and grossly unremarkable. The patient is status post hysterectomy. No suspicious adnexal masses are seen. Other: No additional soft tissue abnormalities are seen. Musculoskeletal: No acute osseous abnormalities are identified. The visualized musculature is unremarkable in appearance. IMPRESSION: No acute abnormality seen within the abdomen or pelvis. Aortic Atherosclerosis (ICD10-I70.0). Electronically Signed   By: Roanna RaiderJeffery  Chang M.D.   On: 06/15/2018 22:47    Assessment/Plan Active Problems:   Pyelonephritis   Acute pyelonephritis with intractable nausea vomiting -Patient with poor p.o. intake, unable to keep antibiotics down, will admit to the hospital for intravenous ceftriaxone, urine cultures were sent however likely that they will remain negative.  Monitor clinically  Type 2 diabetes mellitus -Place patient on sliding scale insulin  Hyponatremia -Slightly dehydrated, provide normal saline and recheck tomorrow morning  Hypokalemia -Replete  Hypertension -Resume home lisinopril   DVT prophylaxis: Lovenox  Code Status: Full code  Family Communication: friend at bedside Disposition Plan: home  when ready  Consults called: none     Pamella Pertostin Latausha Flamm, MD, PhD [facility] Pager [phone]  If 7PM-7AM, please contact night-coverage [email or web address] Password [email or web address]  06/17/2018, 3:45 PM

## 2018-06-17 NOTE — ED Triage Notes (Addendum)
C/O being diagnosed with UTI 4 days ago & started Cipro.  Started to feel worse; [facility] - had CT scan, IV, Rocephin, told to switch PO abx to Keflex, was told infection was starting to get into kidney.  Pt states she rested all day yesterday, now today feeling worse again.  Denies any known fevers; c/o nausea, abd & back pain.

## 2018-06-17 NOTE — ED Provider Notes (Signed)
[facility]    CSN: [id number] Arrival date & time: 06/17/18  1057     History   Chief Complaint Chief Complaint  Patient presents with  . Flank Pain    HPI Shannon [name] is a 62 y.o. female.   This is a new urgent care visit for this 62 year old woman who reports being diagnosed with UTI 4 days ago & started Cipro.  Started to feel worse; [facility] - had CT scan, IV, Rocephin, told to switch PO abx to Keflex, was told infection was starting to get into kidney.  Pt states she rested all day yesterday, now today feeling worse again.  Denies any known fevers; c/o nausea, abd & back pain.     Past Medical History:  Diagnosis Date  . Diabetes mellitus without complication (HCC)   . Hypercholesteremia     Patient Active Problem List   Diagnosis Date Noted  . Type 2 diabetes mellitus without complication, with long-term current use of insulin (HCC) 05/30/2018  . Essential hypertension 05/30/2018    Past Surgical History:  Procedure Laterality Date  . ABDOMINAL HYSTERECTOMY    . CESAREAN SECTION    . MYOMECTOMY      OB History   No obstetric history on file.      Home Medications    Prior to Admission medications   Medication Sig Start Date End Date Taking? Authorizing Provider  Atorvastatin Calcium (LIPITOR PO) Take by mouth.   Yes [provider]  cephALEXin (KEFLEX) 500 MG capsule Take 1 capsule (500 mg total) by mouth 4 (four) times daily. 06/15/18  Yes Jacalyn LefevreHaviland, Julie, MD  HYDROcodone-acetaminophen (NORCO/VICODIN) 5-325 MG tablet Take 1 tablet by mouth every 4 (four) hours as needed. 06/15/18  Yes Jacalyn LefevreHaviland, Julie, MD  LISINOPRIL PO Take by mouth.   Yes [provider]  ondansetron (ZOFRAN ODT) 4 MG disintegrating tablet Take 1 tablet (4 mg total) by mouth every 8 (eight) hours as needed. 06/15/18  Yes Jacalyn LefevreHaviland, Julie, MD  Semaglutide,0.25 or 0.5MG /DOS, (OZEMPIC, 0.25 OR 0.5 MG/DOSE,) 2 MG/1.5ML SOPN Inject into the  skin. Inject 0.5mg  subcutaneous route into the thigh, abdomen or upper arm on the same day each week rotating injection sites   Yes [provider]  TRESIBA FLEXTOUCH 100 UNIT/ML SOPN FlexTouch Pen INJECT 21 UNITS SUBCUTANEOUSLY AS PER INSULIN PROTOCOL 04/10/18  Yes Arnette FeltsMoore, Janece, FNP  VITAMIN D, CHOLECALCIFEROL, PO Take 1 tablet by mouth daily.   Yes [provider]  Insulin Detemir (LEVEMIR Saunders) Inject into the skin.    [provider]    Family History Family History  Problem Relation Age of Onset  . Hypertension Mother   . Diabetes Mother   . Diabetes Father   . Hypertension Father   . Hypertension Son   . Diabetes Son     Social History Social History   Tobacco Use  . Smoking status: Former Games developermoker  . Smokeless tobacco: Never Used  . Tobacco comment: quit 10 yrs  Substance Use Topics  . Alcohol use: Yes    Comment: rarely  . Drug use: Never     Allergies   Patient has no known allergies.   Review of Systems Review of Systems   Physical Exam Triage Vital Signs ED Triage Vitals [06/17/18 1134]  Enc Vitals Group     BP (!) 169/82     Pulse Rate 81     Resp 16     Temp  Temp src      SpO2 100 %     Weight      Height      Head Circumference      Peak Flow      Pain Score 10     Pain Loc      Pain Edu?      Excl. in GC?    Orthostatic VS for the past 24 hrs:  BP- Lying Pulse- Lying BP- Sitting Pulse- Sitting BP- Standing at 0 minutes Pulse- Standing at 0 minutes  06/17/18 1153 184/77 72 174/77 79 154/71 80    Updated Vital Signs BP (!) 169/82   Pulse 81   Resp 16   SpO2 100%    Physical Exam Vitals signs and nursing note reviewed.  Constitutional:      Appearance: She is ill-appearing.  HENT:     Head: Normocephalic.     Mouth/Throat:     Mouth: Mucous membranes are moist.  Eyes:     Conjunctiva/sclera: Conjunctivae normal.  Neck:     Musculoskeletal: Normal range of motion and neck supple.  Cardiovascular:      Rate and Rhythm: Normal rate.     Heart sounds: Normal heart sounds.  Pulmonary:     Effort: Pulmonary effort is normal.     Breath sounds: Normal breath sounds.  Abdominal:     General: Bowel sounds are normal. There is distension.     Tenderness: There is abdominal tenderness.     Comments: Tender bilateral flank and lower abdomen  Musculoskeletal: Normal range of motion.  Skin:    General: Skin is warm and dry.  Neurological:     General: No focal deficit present.  Psychiatric:        Mood and Affect: Mood normal.      UC Treatments / Results  Labs (all labs ordered are listed, but only abnormal results are displayed) Labs Reviewed  POCT URINALYSIS DIP (DEVICE) - Abnormal; Notable for the following components:      Result Value   Glucose, UA 100 (*)    Ketones, ur TRACE (*)    Hgb urine dipstick TRACE (*)    Nitrite POSITIVE (*)    All other components within normal limits  POCT I-STAT, CHEM 8 - Abnormal; Notable for the following components:   Sodium 132 (*)    Chloride 94 (*)    Glucose, Bld 183 (*)    Calcium, Ion 1.14 (*)    All other components within normal limits  I-STAT CHEM 8, ED    EKG None  Radiology Ct Renal Stone Study  Result Date: 06/15/2018 CLINICAL DATA:  Acute onset of right flank pain and generalized abdominal pain. EXAM: CT ABDOMEN AND PELVIS WITHOUT CONTRAST TECHNIQUE: Multidetector CT imaging of the abdomen and pelvis was performed following the standard protocol without IV contrast. COMPARISON:  None. FINDINGS: Lower chest: Minimal scarring is noted at the lung bases. The visualized portions of the mediastinum are unremarkable. Hepatobiliary: The liver is unremarkable in appearance. The gallbladder is unremarkable in appearance. The common bile duct remains normal in caliber. Pancreas: The pancreas is within normal limits. Spleen: The spleen is unremarkable in appearance. Adrenals/Urinary Tract: The adrenal glands are unremarkable in  appearance. The kidneys are within normal limits. There is no evidence of hydronephrosis. No renal or ureteral stones are identified. No perinephric stranding is seen. Stomach/Bowel: The stomach is unremarkable in appearance. The small bowel is within normal limits. The appendix is normal in  caliber, without evidence of appendicitis. The colon is unremarkable in appearance. Vascular/Lymphatic: Scattered calcification is seen along the abdominal aorta and its branches. The abdominal aorta is otherwise grossly unremarkable. The inferior vena cava is grossly unremarkable. No retroperitoneal lymphadenopathy is seen. No pelvic sidewall lymphadenopathy is identified. Reproductive: The bladder is mildly distended and grossly unremarkable. The patient is status post hysterectomy. No suspicious adnexal masses are seen. Other: No additional soft tissue abnormalities are seen. Musculoskeletal: No acute osseous abnormalities are identified. The visualized musculature is unremarkable in appearance. IMPRESSION: No acute abnormality seen within the abdomen or pelvis. Aortic Atherosclerosis (ICD10-I70.0). Electronically Signed   By: Roanna Raider M.D.   On: 06/15/2018 22:47    Procedures Procedures (including critical care time)  Medications Ordered in UC Medications - No data to display  Initial Impression / Assessment and Plan / UC Course  I have reviewed the triage vital signs and the nursing notes.  Pertinent labs & imaging results that were available during my care of the patient were reviewed by me and considered in my medical decision making (see chart for details).    Final Clinical Impressions(s) / UC Diagnoses   Final diagnoses:  Pyelonephritis     Discharge Instructions     The blood work shows that your kidneys are functioning normally.  The urine test shows that you still have infection.  Because you are having difficulty eating and keeping proper amount of fluids down, you need to go down to  the emergency room for fluid resuscitation and further evaluation.    ED Prescriptions    None     Controlled Substance Prescriptions [place] Controlled Substance Registry consulted? Not Applicable   Elvina Sidle, MD 06/17/18 1210

## 2018-06-17 NOTE — Discharge Instructions (Addendum)
The blood work shows that your kidneys are functioning normally.  The urine test shows that you still have infection.  Because you are having difficulty eating and keeping proper amount of fluids down, you need to go down to the emergency room for fluid resuscitation and further evaluation.

## 2018-06-17 NOTE — ED Provider Notes (Signed)
[facility] EMERGENCY [facility]PARTMENT Provider Note   CSN: [id number] Arrival date & time: 06/17/18  1224     History   Chief Complaint Chief Complaint  Patient presents with  . Recurrent UTI    HPI Shannon [name] is a 62 y.o. female was sent to the emergency department from the urgent care for worsening symptoms after diagnosis of UTI and outpatient antibiotics.  Patient was diagnosed 4 days ago with urinary tract infection and started on Cipro.  [facility] 3 nights ago, received IV Rocephin, had a CT scan which showed no renal stones or evidence of pyelonephritis and was switched to Keflex.  Patient states that she was feeling much better at discharge however last night she began having severe bilateral flank pain, pain in her suprapubic abdominal region with associated nausea and multiple episodes of vomiting.  She has a history of diabetes and hypercholesterolemia but denies a history of gastroparesis or DKA.  Patient predominately is complaining of pain and nausea at this time along with severe pain in her back and abdomen.  She denies diarrhea constipation or vaginal symptoms.  HPI  Past Medical History:  Diagnosis Date  . Diabetes mellitus without complication (HCC)   . Hypercholesteremia     Patient Active Problem List   Diagnosis Date Noted  . Type 2 diabetes mellitus without complication, with long-term current use of insulin (HCC) 05/30/2018  . Essential hypertension 05/30/2018    Past Surgical History:  Procedure Laterality Date  . ABDOMINAL HYSTERECTOMY    . CESAREAN SECTION    . MYOMECTOMY       OB History   No obstetric history on file.      Home Medications    Prior to Admission medications   Medication Sig Start Date End Date Taking? Authorizing Provider  Atorvastatin Calcium (LIPITOR PO) Take by mouth.    [provider]  cephALEXin (KEFLEX) 500 MG capsule Take 1 capsule (500 mg total) by  mouth 4 (four) times daily. 06/15/18   Jacalyn LefevreHaviland, Julie, MD  HYDROcodone-acetaminophen (NORCO/VICODIN) 5-325 MG tablet Take 1 tablet by mouth every 4 (four) hours as needed. 06/15/18   Jacalyn LefevreHaviland, Julie, MD  Insulin Detemir (LEVEMIR Bude) Inject into the skin.    [provider]  LISINOPRIL PO Take by mouth.    [provider]  ondansetron (ZOFRAN ODT) 4 MG disintegrating tablet Take 1 tablet (4 mg total) by mouth every 8 (eight) hours as needed. 06/15/18   Jacalyn LefevreHaviland, Julie, MD  Semaglutide,0.25 or 0.5MG /DOS, (OZEMPIC, 0.25 OR 0.5 MG/DOSE,) 2 MG/1.5ML SOPN Inject into the skin. Inject 0.5mg  subcutaneous route into the thigh, abdomen or upper arm on the same day each week rotating injection sites    [provider]  TRESIBA FLEXTOUCH 100 UNIT/ML SOPN FlexTouch Pen INJECT 21 UNITS SUBCUTANEOUSLY AS PER INSULIN PROTOCOL 04/10/18   Arnette FeltsMoore, Janece, FNP  VITAMIN D, CHOLECALCIFEROL, PO Take 1 tablet by mouth daily.    [provider]    Family History Family History  Problem Relation Age of Onset  . Hypertension Mother   . Diabetes Mother   . Diabetes Father   . Hypertension Father   . Hypertension Son   . Diabetes Son     Social History Social History   Tobacco Use  . Smoking status: Former Games developermoker  . Smokeless tobacco: Never Used  . Tobacco comment: quit 10 yrs  Substance Use Topics  . Alcohol use: Yes  Comment: rarely  . Drug use: Never     Allergies   Patient has no known allergies.   Review of Systems Review of Systems Ten systems reviewed and are negative for acute change, except as noted in the HPI.    Physical Exam Updated Vital Signs BP (!) 171/79 (BP Location: Right Arm)   Pulse 75   Temp 98.2 F (36.8 C) (Oral)   Resp 14   Ht 5\' 3"  (1.6 m)   Wt 71.2 kg   SpO2 100%   BMI 27.81 kg/m   Physical Exam Vitals signs and nursing note reviewed.  Constitutional:      General: She is not in acute distress.    Appearance: She is  well-developed. She is not diaphoretic.  HENT:     Head: Normocephalic and atraumatic.  Eyes:     General: No scleral icterus.    Conjunctiva/sclera: Conjunctivae normal.  Neck:     Musculoskeletal: Normal range of motion.  Cardiovascular:     Rate and Rhythm: Normal rate and regular rhythm.     Heart sounds: Normal heart sounds. No murmur. No friction rub. No gallop.   Pulmonary:     Effort: Pulmonary effort is normal. No respiratory distress.     Breath sounds: Normal breath sounds.  Abdominal:     General: Bowel sounds are normal. There is no distension.     Palpations: Abdomen is soft. There is no mass.     Tenderness: There is abdominal tenderness (suprapubic). There is right CVA tenderness and left CVA tenderness. There is no guarding.  Skin:    General: Skin is warm and dry.  Neurological:     Mental Status: She is alert and oriented to person, place, and time.  Psychiatric:        Behavior: Behavior normal.      ED Treatments / Results  Labs (all labs ordered are listed, but only abnormal results are displayed) Labs Reviewed  URINE CULTURE  COMPREHENSIVE METABOLIC PANEL  LIPASE, BLOOD  CBC WITH DIFFERENTIAL/PLATELET   Results for orders placed or performed during the hospital encounter of 06/17/18  Comprehensive metabolic panel  Result Value Ref Range   Sodium 132 (L) 135 - 145 mmol/L   Potassium 3.1 (L) 3.5 - 5.1 mmol/L   Chloride 96 (L) 98 - 111 mmol/L   CO2 25 22 - 32 mmol/L   Glucose, Bld 173 (H) 70 - 99 mg/dL   BUN 10 8 - 23 mg/dL   Creatinine, Ser 1.61 0.44 - 1.00 mg/dL   Calcium 9.2 8.9 - 09.6 mg/dL   Total Protein 7.4 6.5 - 8.1 g/dL   Albumin 3.9 3.5 - 5.0 g/dL   AST 16 15 - 41 U/L   ALT 18 0 - 44 U/L   Alkaline Phosphatase 73 38 - 126 U/L   Total Bilirubin 0.8 0.3 - 1.2 mg/dL   GFR calc non Af Amer >60 >60 mL/min   GFR calc Af Amer >60 >60 mL/min   Anion gap 11 5 - 15  Lipase, blood  Result Value Ref Range   Lipase 22 11 - 51 U/L  CBC with  Differential  Result Value Ref Range   WBC 6.4 4.0 - 10.5 K/uL   RBC 4.48 3.87 - 5.11 MIL/uL   Hemoglobin 12.5 12.0 - 15.0 g/dL   HCT 04.5 40.9 - 81.1 %   MCV 84.8 80.0 - 100.0 fL   MCH 27.9 26.0 - 34.0 pg   MCHC 32.9 30.0 -  36.0 g/dL   RDW 40.913.1 81.111.5 - 91.415.5 %   Platelets 300 150 - 400 K/uL   nRBC 0.0 0.0 - 0.2 %   Neutrophils Relative % 63 %   Neutro Abs 4.0 1.7 - 7.7 K/uL   Lymphocytes Relative 27 %   Lymphs Abs 1.7 0.7 - 4.0 K/uL   Monocytes Relative 8 %   Monocytes Absolute 0.5 0.1 - 1.0 K/uL   Eosinophils Relative 1 %   Eosinophils Absolute 0.0 0.0 - 0.5 K/uL   Basophils Relative 1 %   Basophils Absolute 0.0 0.0 - 0.1 K/uL   Immature Granulocytes 0 %   Abs Immature Granulocytes 0.01 0.00 - 0.07 K/uL    EKG None  Radiology Ct Renal Stone Study  Result Date: 06/15/2018 CLINICAL DATA:  Acute onset of right flank pain and generalized abdominal pain. EXAM: CT ABDOMEN AND PELVIS WITHOUT CONTRAST TECHNIQUE: Multidetector CT imaging of the abdomen and pelvis was performed following the standard protocol without IV contrast. COMPARISON:  None. FINDINGS: Lower chest: Minimal scarring is noted at the lung bases. The visualized portions of the mediastinum are unremarkable. Hepatobiliary: The liver is unremarkable in appearance. The gallbladder is unremarkable in appearance. The common bile duct remains normal in caliber. Pancreas: The pancreas is within normal limits. Spleen: The spleen is unremarkable in appearance. Adrenals/Urinary Tract: The adrenal glands are unremarkable in appearance. The kidneys are within normal limits. There is no evidence of hydronephrosis. No renal or ureteral stones are identified. No perinephric stranding is seen. Stomach/Bowel: The stomach is unremarkable in appearance. The small bowel is within normal limits. The appendix is normal in caliber, without evidence of appendicitis. The colon is unremarkable in appearance. Vascular/Lymphatic: Scattered calcification  is seen along the abdominal aorta and its branches. The abdominal aorta is otherwise grossly unremarkable. The inferior vena cava is grossly unremarkable. No retroperitoneal lymphadenopathy is seen. No pelvic sidewall lymphadenopathy is identified. Reproductive: The bladder is mildly distended and grossly unremarkable. The patient is status post hysterectomy. No suspicious adnexal masses are seen. Other: No additional soft tissue abnormalities are seen. Musculoskeletal: No acute osseous abnormalities are identified. The visualized musculature is unremarkable in appearance. IMPRESSION: No acute abnormality seen within the abdomen or pelvis. Aortic Atherosclerosis (ICD10-I70.0). Electronically Signed   By: Roanna RaiderJeffery  Chang M.D.   On: 06/15/2018 22:47    Procedures Procedures (including critical care time)  Medications Ordered in ED Medications - No data to display   Initial Impression / Assessment and Plan / ED Course  I have reviewed the triage vital signs and the nursing notes.  Pertinent labs & imaging results that were available during my care of the patient were reviewed by me and considered in my medical decision making (see chart for details).     62 year old female with recurrent bilateral flank pain, suprapubic abdominal pain, urinary tract infection which has failed outpatient antibiotics.The differential diagnosis of emergent flank pain includes, but is not limited to :Abdominal aortic aneurysm,, Renal artery embolism,Renal vein thrombosis, Aortic dissection, Mesenteric ischemia, Pyelonephritis, Renal infarction, Renal hemorrhage, Nephrolithiasis/ Renal Colic, Bladder tumor,Cystitis, Biliary colic, Pancreatitis Perforated peptic ulcer Appendicitis ,Inguinal Hernia, Diverticulitis, Bowel obstruction  Ovarian torsion, shingles,Lower lobe pneumonia, Retroperitoneal hematoma/abscess/tumor, Epidural abscess, Epidural hematoma   Although her CT scan was negative I do believe her symptoms are  consistent with acute pyelonephritis.  I discussed the case with Dr. Yvetta CoderGergely who will admit the patient.  She is receiving IV antibiotics and fluids.  Final Clinical Impressions(s) / ED Diagnoses  Final diagnoses:  Pyelonephritis    ED Discharge Orders    None       Arthor Captain, PA-C 06/17/18 1639    Lorre Nick, MD 06/18/18 [phone]

## 2018-06-17 NOTE — ED Triage Notes (Signed)
Pt sent here from UC for recurrent UTI despite cipro and keflex.

## 2018-06-18 DIAGNOSIS — Z833 Family history of diabetes mellitus: Secondary | ICD-10-CM | POA: Diagnosis not present

## 2018-06-18 DIAGNOSIS — Z79899 Other long term (current) drug therapy: Secondary | ICD-10-CM | POA: Diagnosis not present

## 2018-06-18 DIAGNOSIS — E860 Dehydration: Secondary | ICD-10-CM | POA: Diagnosis present

## 2018-06-18 DIAGNOSIS — N10 Acute tubulo-interstitial nephritis: Secondary | ICD-10-CM | POA: Diagnosis present

## 2018-06-18 DIAGNOSIS — E1165 Type 2 diabetes mellitus with hyperglycemia: Secondary | ICD-10-CM | POA: Diagnosis present

## 2018-06-18 DIAGNOSIS — E7800 Pure hypercholesterolemia, unspecified: Secondary | ICD-10-CM | POA: Diagnosis present

## 2018-06-18 DIAGNOSIS — E876 Hypokalemia: Secondary | ICD-10-CM | POA: Diagnosis present

## 2018-06-18 DIAGNOSIS — E871 Hypo-osmolality and hyponatremia: Secondary | ICD-10-CM | POA: Diagnosis present

## 2018-06-18 DIAGNOSIS — N12 Tubulo-interstitial nephritis, not specified as acute or chronic: Secondary | ICD-10-CM | POA: Diagnosis not present

## 2018-06-18 DIAGNOSIS — N390 Urinary tract infection, site not specified: Secondary | ICD-10-CM | POA: Diagnosis present

## 2018-06-18 DIAGNOSIS — Z87891 Personal history of nicotine dependence: Secondary | ICD-10-CM | POA: Diagnosis not present

## 2018-06-18 DIAGNOSIS — Z90710 Acquired absence of both cervix and uterus: Secondary | ICD-10-CM | POA: Diagnosis not present

## 2018-06-18 DIAGNOSIS — I10 Essential (primary) hypertension: Secondary | ICD-10-CM | POA: Diagnosis present

## 2018-06-18 DIAGNOSIS — Z794 Long term (current) use of insulin: Secondary | ICD-10-CM | POA: Diagnosis not present

## 2018-06-18 DIAGNOSIS — Z8249 Family history of ischemic heart disease and other diseases of the circulatory system: Secondary | ICD-10-CM | POA: Diagnosis not present

## 2018-06-18 DIAGNOSIS — Z87440 Personal history of urinary (tract) infections: Secondary | ICD-10-CM | POA: Diagnosis not present

## 2018-06-18 DIAGNOSIS — E785 Hyperlipidemia, unspecified: Secondary | ICD-10-CM | POA: Diagnosis present

## 2018-06-18 LAB — COMPREHENSIVE METABOLIC PANEL
ALT: 14 U/L (ref 0–44)
AST: 14 U/L — ABNORMAL LOW (ref 15–41)
Albumin: 3.3 g/dL — ABNORMAL LOW (ref 3.5–5.0)
Alkaline Phosphatase: 58 U/L (ref 38–126)
Anion gap: 10 (ref 5–15)
BUN: 9 mg/dL (ref 8–23)
CO2: 23 mmol/L (ref 22–32)
Calcium: 8.1 mg/dL — ABNORMAL LOW (ref 8.9–10.3)
Chloride: 104 mmol/L (ref 98–111)
Creatinine, Ser: 0.58 mg/dL (ref 0.44–1.00)
GFR calc Af Amer: >60 mL/min (ref >60)
GFR calc non Af Amer: >60 mL/min (ref >60)
Glucose, Bld: 191 mg/dL — ABNORMAL HIGH (ref 70–99)
Potassium: 3.5 mmol/L (ref 3.5–5.1)
Sodium: 137 mmol/L (ref 135–145)
Total Bilirubin: 0.6 mg/dL (ref 0.3–1.2)
Total Protein: 6.3 g/dL — ABNORMAL LOW (ref 6.5–8.1)

## 2018-06-18 LAB — GLUCOSE, CAPILLARY
Glucose-Capillary: 177 mg/dL — ABNORMAL HIGH (ref 70–99)
Glucose-Capillary: 128 mg/dL — ABNORMAL HIGH (ref 70–99)
Glucose-Capillary: 151 mg/dL — ABNORMAL HIGH (ref 70–99)
Glucose-Capillary: 140 mg/dL — ABNORMAL HIGH (ref 70–99)

## 2018-06-18 LAB — URINE CULTURE: Culture: NO GROWTH

## 2018-06-18 LAB — CBC
HCT: 34.6 % — ABNORMAL LOW (ref 36.0–46.0)
Hemoglobin: 11 g/dL — ABNORMAL LOW (ref 12.0–15.0)
MCH: 27.2 pg (ref 26.0–34.0)
MCHC: 31.8 g/dL (ref 30.0–36.0)
MCV: 85.4 fL (ref 80.0–100.0)
Platelets: 262 10*3/uL (ref 150–400)
RBC: 4.05 MIL/uL (ref 3.87–5.11)
RDW: 13.2 % (ref 11.5–15.5)
WBC: 5.8 10*3/uL (ref 4.0–10.5)
nRBC: 0 % (ref 0.0–0.2)

## 2018-06-18 MED ORDER — ATORVASTATIN CALCIUM 10 MG PO TABS
20.0000 mg | ORAL_TABLET | Freq: Every day | ORAL | Status: DC
  Administered 2018-06-18 – 2018-06-21 (×4): 20 mg via ORAL
  Filled 2018-06-18 (×4): qty 2

## 2018-06-18 MED ORDER — LISINOPRIL 10 MG PO TABS
10.0000 mg | ORAL_TABLET | Freq: Every day | ORAL | Status: DC
  Administered 2018-06-18 – 2018-06-21 (×4): 10 mg via ORAL
  Filled 2018-06-18 (×4): qty 1

## 2018-06-18 MED ORDER — VITAMIN D 25 MCG (1000 UNIT) PO TABS
1000.0000 [IU] | ORAL_TABLET | Freq: Every day | ORAL | Status: DC
  Administered 2018-06-18 – 2018-06-21 (×4): 1000 [IU] via ORAL
  Filled 2018-06-18 (×4): qty 1

## 2018-06-18 NOTE — Progress Notes (Signed)
PROGRESS NOTE    Shannon ErichsenSylvia A Siebel  NFA:[id number] DOB: [birth year] DOA: 06/17/2018 PCP: Dorothyann PengSanders, Robyn, MD   Brief Narrative:  Shannon [name] is a 62 y.o. female with medical history significant of type 2 diabetes mellitus, hyperlipidemia, hypertension who presents to the hospital with persistent nausea vomiting as well as bilateral flank pain right more than the left.  Patient has been having UTI type symptoms with burning with urination as well as flank pain over the last 3 to 4 days, she was evaluated in the emergency room 2 days ago and was given antibiotics, however despite taking oral antibiotics her symptoms have progressed, she is feeling sicker, she has been having profuse nausea and vomiting and inability to keep any oral intake and decided to come back to the emergency room.  Due to her poor p.o. intake and significant pain and inability to keep down her antibiotics patient was admitted to the hospital for IV antibiotics.   Assessment & Plan:   Active Problems:   Pyelonephritis  Acute pyelonephritis with intractable nausea vomiting -Patient with poor p.o. intake, unable to keep antibiotics down, will admit to the hospital for intravenous ceftriaxone, urine cultures were sent however likely that they will remain negative.    Still nauseated and not tolerating p.o. well.  We will continue to monitor in the hospital.  Type 2 diabetes mellitus -Place patient on sliding scale insulin  Hyponatremia -Slightly dehydrated, provide normal saline and recheck tomorrow morning  Hypokalemia -Replete  Hypertension -Resume home lisinopril   DVT prophylaxis: Lovenox  Code Status: Full code  Family Communication: friend at bedside Disposition Plan: home when ready  Consults called: none     Antimicrobials:   IV ceftriaxone   Subjective: Still nauseated.  Unable to tolerate her breakfast.  Has had no vomiting thankfully.  Does not feel ready for discharge  home.  Objective: Vitals:   06/17/18 2040 06/18/18 0013 06/18/18 0445 06/18/18 0755  BP:   (!) 175/79 (!) 165/73  Pulse:   73 72  Resp:   18 20  Temp:   98.1 F (36.7 C) 97.7 F (36.5 C)  TempSrc:   Oral Oral  SpO2:   99% 99%  Weight: 68.1 kg 65.1 kg    Height: 5\' 3"  (1.6 m)       Intake/Output Summary (Last 24 hours) at 06/18/2018 1132 Last data filed at 06/18/2018 0521 Gross per 24 hour  Intake 1558.17 ml  Output 300 ml  Net 1258.17 ml   Filed Weights   06/17/18 1237 06/17/18 2040 06/18/18 0013  Weight: 71.2 kg 68.1 kg 65.1 kg    Examination:  General exam: Appears calm and comfortable  Respiratory system: Clear to auscultation. Respiratory effort normal. Cardiovascular system: S1 & S2 heard, RRR. No JVD, murmurs, rubs, gallops or clicks. No pedal edema. Gastrointestinal system: Abdomen is nondistended, soft and nontender. No organomegaly or masses felt. Normal bowel sounds heard. Central nervous system: Alert and oriented. No focal neurological deficits. Extremities: Symmetric 5 x 5 power. Skin: No rashes, lesions or ulcers Psychiatry: Judgement and insight appear normal. Mood & affect appropriate.     Data Reviewed: I have personally reviewed following labs and imaging studies  CBC: Recent Labs  Lab 06/15/18 2221 06/17/18 1207 06/17/18 1241 06/18/18 0633  WBC 7.9  --  6.4 5.8  NEUTROABS  --   --  4.0  --   HGB 12.5 13.9 12.5 11.0*  HCT 37.9 41.0 38.0 34.6*  MCV 86.1  --  84.8 85.4  PLT 261  --  300 262   Basic Metabolic Panel: Recent Labs  Lab 06/15/18 2221 06/17/18 1207 06/17/18 1241 06/18/18 0633  NA 134* 132* 132* 137  K 4.3 3.7 3.1* 3.5  CL 99 94* 96* 104  CO2 22  --  25 23  GLUCOSE 217* 183* 173* 191*  BUN 11 11 10 9   CREATININE 0.77 0.50 0.58 0.58  CALCIUM 9.3  --  9.2 8.1*   GFR: Estimated Creatinine Clearance: 64.5 mL/min (by C-G formula based on SCr of 0.58 mg/dL). Liver Function Tests: Recent Labs  Lab 06/17/18 1241  06/18/18 0633  AST 16 14*  ALT 18 14  ALKPHOS 73 58  BILITOT 0.8 0.6  PROT 7.4 6.3*  ALBUMIN 3.9 3.3*   Recent Labs  Lab 06/17/18 1241  LIPASE 22   No results for input(s): AMMONIA in the last 168 hours. Coagulation Profile: No results for input(s): INR, PROTIME in the last 168 hours. Cardiac Enzymes: No results for input(s): CKTOTAL, CKMB, CKMBINDEX, TROPONINI in the last 168 hours. BNP (last 3 results) No results for input(s): PROBNP in the last 8760 hours. HbA1C: No results for input(s): HGBA1C in the last 72 hours. CBG: Recent Labs  Lab 06/17/18 1719 06/17/18 2045 06/18/18 0749  GLUCAP 104* 141* 177*   Lipid Profile: No results for input(s): CHOL, HDL, LDLCALC, TRIG, CHOLHDL, LDLDIRECT in the last 72 hours. Thyroid Function Tests: No results for input(s): TSH, T4TOTAL, FREET4, T3FREE, THYROIDAB in the last 72 hours. Anemia Panel: No results for input(s): VITAMINB12, FOLATE, FERRITIN, TIBC, IRON, RETICCTPCT in the last 72 hours. Sepsis Labs: No results for input(s): PROCALCITON, LATICACIDVEN in the last 168 hours.  Recent Results (from the past 240 hour(s))  Urine culture     Status: None   Collection Time: 06/17/18 12:41 PM  Result Value Ref Range Status   Specimen Description URINE, RANDOM  Final   Special Requests NONE  Final   Culture   Final    NO GROWTH Performed at [facility], [address] [place], [place] [address]    Report Status 06/18/2018 FINAL  Final         Radiology Studies: No results found.      Scheduled Meds: . atorvastatin  20 mg Oral Daily  . cholecalciferol  1,000 Units Oral Daily  . docusate sodium  200 mg Oral QHS  . enoxaparin (LOVENOX) injection  40 mg Subcutaneous Q24H  . insulin aspart  0-9 Units Subcutaneous TID WC  . lisinopril  10 mg Oral Daily   Continuous Infusions: . sodium chloride 75 mL/hr at 06/18/18 0502  . cefTRIAXone (ROCEPHIN)  IV       LOS: 0 days    Time spent: 35  minutes    Lahoma Crockerheresa C Philicia Heyne, MD FACP [facility] Pager [phone]  If 7PM-7AM, please contact night-coverage [email or web address] Password [email or web address] 06/18/2018, 11:32 AM

## 2018-06-19 LAB — GLUCOSE, CAPILLARY
Glucose-Capillary: 162 mg/dL — ABNORMAL HIGH (ref 70–99)
Glucose-Capillary: 159 mg/dL — ABNORMAL HIGH (ref 70–99)
Glucose-Capillary: 139 mg/dL — ABNORMAL HIGH (ref 70–99)
Glucose-Capillary: 160 mg/dL — ABNORMAL HIGH (ref 70–99)

## 2018-06-19 LAB — HIV ANTIBODY (ROUTINE TESTING W REFLEX): HIV Screen 4th Generation wRfx: NONREACTIVE

## 2018-06-19 MED ORDER — POLYETHYLENE GLYCOL 3350 17 G PO PACK
17.0000 g | PACK | Freq: Every day | ORAL | Status: DC
  Administered 2018-06-19 – 2018-06-21 (×2): 17 g via ORAL
  Filled 2018-06-19 (×2): qty 1

## 2018-06-19 MED ORDER — SODIUM CHLORIDE 0.9 % IV SOLN
INTRAVENOUS | Status: DC | PRN
  Administered 2018-06-19: 18:00:00 via INTRAVENOUS

## 2018-06-19 MED ORDER — BISACODYL 5 MG PO TBEC: 5.0000 mg | DELAYED_RELEASE_TABLET | Freq: Every day | ORAL | Status: DC | PRN

## 2018-06-19 NOTE — Progress Notes (Signed)
PROGRESS NOTE        PATIENT DETAILS Name: Shannon [name] Age: 62 y.o. Sex: female Date of Birth: [birth year] Admit Date: 06/17/2018 Admitting Physician Costin Otelia SergeantM Gherghe, MD ZHY:[id number]:Sanders, Melina Schoolsobyn, MD  Brief Narrative: Patient is a 62 y.o. 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  She subsequently started having nausea and vomiting-came to the ED on 2 with ongoing dysuria-she was given Keflex but continued to have symptoms-and presented back to the ED on 12/24-she was thought to have pyelonephritis-and subsequently admitted to the triad hospitalist service on 12/24.    Subjective: No vomiting-no nausea-tolerating clear liquids.  Continues to have bilateral flank pain which has improved somewhat.  Assessment/Plan: Bilateral pyelonephritis: Clinically- history consistent with UTI/pyelonephritis-unfortunately urine cultures are negative as she was already on 2 antimicrobial therapy as outpatient before cultures were obtained.  Seems to be slowly improving-afebrile-no leukocytosis-no longer vomiting-advance to full liquid diets today.  Hopefully clinical improvement will continue-if she continues to have severe bilateral flank pain-will need to repeat imaging to rule out abscess formation (initial CT renal stone protocol negative).  Continue IV Rocephin.  Hypertension: Controlled-continue lisinopril  Insulin-dependent DM-2: Stable with SSI  Dyslipidemia: Continue statin  DVT Prophylaxis: Prophylactic Lovenox   Code Status: Full code   Family Communication: None at bedside  Disposition Plan: Remain inpatient-but will plan on Home health vs SNF on discharge  Antimicrobial agents: Anti-infectives (From admission, onward)   Start     Dose/Rate Route Frequency Ordered Stop   06/18/18 1600  cefTRIAXone (ROCEPHIN) 1 g in sodium chloride 0.9 % 100 mL IVPB     1 g 200 mL/hr over 30 Minutes Intravenous Every 24 hours 06/17/18 1656     06/17/18 1500  cefTRIAXone (ROCEPHIN) 1 g in sodium chloride 0.9 % 100 mL IVPB     1 g 200 mL/hr over 30 Minutes Intravenous  Once 06/17/18 1446 06/17/18 1654      Procedures: None  CONSULTS:  None  Time spent: 25- minutes-Greater than 50% of this time was spent in counseling, explanation of diagnosis, planning of further management, and coordination of care.  MEDICATIONS: Scheduled Meds: . atorvastatin  20 mg Oral Daily  . cholecalciferol  1,000 Units Oral Daily  . docusate sodium  200 mg Oral QHS  . enoxaparin (LOVENOX) injection  40 mg Subcutaneous Q24H  . insulin aspart  0-9 Units Subcutaneous TID WC  . lisinopril  10 mg Oral Daily   Continuous Infusions: . cefTRIAXone (ROCEPHIN)  IV 1 g (06/18/18 1542)   PRN Meds:.HYDROcodone-acetaminophen, morphine injection, ondansetron **OR** ondansetron (ZOFRAN) IV   PHYSICAL EXAM: Vital signs: Vitals:   06/18/18 1701 06/18/18 2058 06/19/18 0402 06/19/18 0912  BP: (!) 165/72 (!) 144/69 133/67 (!) 149/96  Pulse: 70 69 74 79  Resp: 18 18 14 18   Temp: 98.6 F (37 C) 98.2 F (36.8 C) 97.7 F (36.5 C) 98.1 F (36.7 C)  TempSrc: Oral Oral Oral Oral  SpO2: 99% 99% 98% 100%  Weight:  68.4 kg    Height:       Filed Weights   06/17/18 2040 06/18/18 0013 06/18/18 2058  Weight: 68.1 kg 65.1 kg  68.4 kg   Body mass index is 26.73 kg/m.   General appearance :Awake, alert, not in any distress.  Eyes:Pink conjunctiva HEENT: Atraumatic and Normocephalic Neck: supple, Resp:Good air entry bilaterally, no added sounds  CVS: S1 S2 regular, no murmurs.  GI: Bowel sounds present, Non tender and not distended with no gaurding, rigidity or rebound.No organomegaly.  Has significant bilateral CVA tenderness. Extremities: B/L Lower  Ext shows no edema, both legs are warm to touch Neurology: Moving all 4 extremities-seems to have 5/5 strength all over Psychiatric: Normal judgment and insight. Alert and oriented x 3. Normal mood. Musculoskeletal:No digital cyanosis Skin:No Rash, warm and dry Wounds:N/A  I have personally reviewed following labs and imaging studies  LABORATORY DATA: CBC: Recent Labs  Lab 06/15/18 2221 06/17/18 1207 06/17/18 1241 06/18/18 0633  WBC 7.9  --  6.4 5.8  NEUTROABS  --   --  4.0  --   HGB 12.5 13.9 12.5 11.0*  HCT 37.9 41.0 38.0 34.6*  MCV 86.1  --  84.8 85.4  PLT 261  --  300 262    Basic Metabolic Panel: Recent Labs  Lab 06/15/18 2221 06/17/18 1207 06/17/18 1241 06/18/18 0633  NA 134* 132* 132* 137  K 4.3 3.7 3.1* 3.5  CL 99 94* 96* 104  CO2 22  --  25 23  GLUCOSE 217* 183* 173* 191*  BUN 11 11 10 9   CREATININE 0.77 0.50 0.58 0.58  CALCIUM 9.3  --  9.2 8.1*    GFR: Estimated Creatinine Clearance: 65.9 mL/min (by C-G formula based on SCr of 0.58 mg/dL).  Liver Function Tests: Recent Labs  Lab 06/17/18 1241 06/18/18 0633  AST 16 14*  ALT 18 14  ALKPHOS 73 58  BILITOT 0.8 0.6  PROT 7.4 6.3*  ALBUMIN 3.9 3.3*   Recent Labs  Lab 06/17/18 1241  LIPASE 22   No results for input(s): AMMONIA in the last 168 hours.  Coagulation Profile: No results for input(s): INR, PROTIME in the last 168 hours.  Cardiac Enzymes: No results for input(s): CKTOTAL, CKMB, CKMBINDEX, TROPONINI in the last 168 hours.  BNP (last 3 results) No results for input(s): PROBNP in the last 8760 hours.  HbA1C: No results for input(s): HGBA1C in the last 72 hours.  CBG: Recent Labs  Lab 06/18/18 1207 06/18/18 1659 06/18/18 2058 06/19/18 0716 06/19/18 1116  GLUCAP 128* 151* 140* 162* 159*    Lipid Profile: No results for input(s): CHOL, HDL, LDLCALC, TRIG, CHOLHDL, LDLDIRECT in the last 72 hours.  Thyroid Function Tests: No results for input(s): TSH, T4TOTAL, FREET4,  T3FREE, THYROIDAB in the last 72 hours.  Anemia Panel: No results for input(s): VITAMINB12, FOLATE, FERRITIN, TIBC, IRON, RETICCTPCT in the last 72 hours.  Urine analysis:    Component Value Date/Time   COLORURINE YELLOW 06/17/2018 1305   APPEARANCEUR CLEAR 06/17/2018 1305   LABSPEC 1.003 (L) 06/17/2018 1305   PHURINE 7.0 06/17/2018 1305   GLUCOSEU NEGATIVE 06/17/2018 1305   HGBUR NEGATIVE 06/17/2018 1305   BILIRUBINUR NEGATIVE 06/17/2018 1305   KETONESUR NEGATIVE 06/17/2018 1305   PROTEINUR NEGATIVE 06/17/2018 1305   UROBILINOGEN 1.0 06/17/2018 1151   NITRITE NEGATIVE 06/17/2018 1305   LEUKOCYTESUR NEGATIVE 06/17/2018 1305    Sepsis Labs: Lactic Acid, Venous No results found for: LATICACIDVEN  MICROBIOLOGY: Recent Results (from the past 240 hour(s))  Urine culture     Status: None   Collection Time: 06/17/18 12:41 PM  Result Value Ref Range Status   Specimen Description  URINE, RANDOM  Final   Special Requests NONE  Final   Culture   Final    NO GROWTH Performed at [facility], [address] [place], [place] [address]    Report Status 06/18/2018 FINAL  Final    RADIOLOGY STUDIES/RESULTS: Ct Renal Stone Study  Result Date: 06/15/2018 CLINICAL DATA:  Acute onset of right flank pain and generalized abdominal pain. EXAM: CT ABDOMEN AND PELVIS WITHOUT CONTRAST TECHNIQUE: Multidetector CT imaging of the abdomen and pelvis was performed following the standard protocol without IV contrast. COMPARISON:  None. FINDINGS: Lower chest: Minimal scarring is noted at the lung bases. The visualized portions of the mediastinum are unremarkable. Hepatobiliary: The liver is unremarkable in appearance. The gallbladder is unremarkable in appearance. The common bile duct remains normal in caliber. Pancreas: The pancreas is within normal limits. Spleen: The spleen is unremarkable in appearance. Adrenals/Urinary Tract: The adrenal glands are unremarkable in appearance. The kidneys are  within normal limits. There is no evidence of hydronephrosis. No renal or ureteral stones are identified. No perinephric stranding is seen. Stomach/Bowel: The stomach is unremarkable in appearance. The small bowel is within normal limits. The appendix is normal in caliber, without evidence of appendicitis. The colon is unremarkable in appearance. Vascular/Lymphatic: Scattered calcification is seen along the abdominal aorta and its branches. The abdominal aorta is otherwise grossly unremarkable. The inferior vena cava is grossly unremarkable. No retroperitoneal lymphadenopathy is seen. No pelvic sidewall lymphadenopathy is identified. Reproductive: The bladder is mildly distended and grossly unremarkable. The patient is status post hysterectomy. No suspicious adnexal masses are seen. Other: No additional soft tissue abnormalities are seen. Musculoskeletal: No acute osseous abnormalities are identified. The visualized musculature is unremarkable in appearance. IMPRESSION: No acute abnormality seen within the abdomen or pelvis. Aortic Atherosclerosis (ICD10-I70.0). Electronically Signed   By: Roanna RaiderJeffery  Chang M.D.   On: 06/15/2018 22:47     LOS: 1 day   Jeoffrey MassedShanker Jasdeep Dejarnett, MD  [facility]  If 7PM-7AM, please contact night-coverage  Please page via [email or web address] [email or web address]-click on MD name and type text message  06/19/2018, 11:51 AM

## 2018-06-20 ENCOUNTER — Inpatient Hospital Stay (HOSPITAL_COMMUNITY): Payer: PRIVATE HEALTH INSURANCE

## 2018-06-20 LAB — CBC
HCT: 36.9 % (ref 36.0–46.0)
Hemoglobin: 12.5 g/dL (ref 12.0–15.0)
MCH: 28.2 pg (ref 26.0–34.0)
MCHC: 33.9 g/dL (ref 30.0–36.0)
MCV: 83.1 fL (ref 80.0–100.0)
Platelets: 314 10*3/uL (ref 150–400)
RBC: 4.44 MIL/uL (ref 3.87–5.11)
RDW: 13 % (ref 11.5–15.5)
WBC: 5.4 10*3/uL (ref 4.0–10.5)
nRBC: 0 % (ref 0.0–0.2)

## 2018-06-20 LAB — BASIC METABOLIC PANEL
Anion gap: 14 (ref 5–15)
BUN: 12 mg/dL (ref 8–23)
CO2: 23 mmol/L (ref 22–32)
Calcium: 9.3 mg/dL (ref 8.9–10.3)
Chloride: 97 mmol/L — ABNORMAL LOW (ref 98–111)
Creatinine, Ser: 0.54 mg/dL (ref 0.44–1.00)
GFR calc Af Amer: >60 mL/min (ref >60)
GFR calc non Af Amer: >60 mL/min (ref >60)
Glucose, Bld: 233 mg/dL — ABNORMAL HIGH (ref 70–99)
Potassium: 3.4 mmol/L — ABNORMAL LOW (ref 3.5–5.1)
Sodium: 134 mmol/L — ABNORMAL LOW (ref 135–145)

## 2018-06-20 LAB — GLUCOSE, CAPILLARY
Glucose-Capillary: 215 mg/dL — ABNORMAL HIGH (ref 70–99)
Glucose-Capillary: 144 mg/dL — ABNORMAL HIGH (ref 70–99)
Glucose-Capillary: 150 mg/dL — ABNORMAL HIGH (ref 70–99)
Glucose-Capillary: 184 mg/dL — ABNORMAL HIGH (ref 70–99)

## 2018-06-20 MED ORDER — MAGNESIUM CITRATE PO SOLN
1.0000 | Freq: Once | ORAL | Status: AC
  Administered 2018-06-20: 1 via ORAL
  Filled 2018-06-20: qty 296

## 2018-06-20 NOTE — Progress Notes (Addendum)
PROGRESS NOTE    Shannon ErichsenSylvia A [name]  ZOX:[id number] DOB: 1955/08/01 DOA: 06/17/2018 PCP: Dorothyann PengSanders, Robyn, MD   Brief Narrative:  Patient is a 62 y.o. 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  She subsequently started having nausea and vomiting-came to the ED on 2 with ongoing dysuria-she was given Keflex but continued to have symptoms-and presented back to the ED on 12/24-she was thought to have pyelonephritis-and subsequently admitted to the triad hospitalist service on 12/24.    Assessment & Plan:   Active Problems:   Pyelonephritis  Bilateral pyelonephritis: Clinically- history consistent with UTI/pyelonephritis-unfortunately urine cultures are negative as she was already on 2 antimicrobial therapy as outpatient before cultures were obtained.  Seems to be slowly improving-afebrile-no leukocytosis-no longer vomiting-advance from full to soft diet today.   -Continue on IV Rocephin -Add renal ultrasound for further evaluation due to persistent flank pain; still requiring IV morphine for breakthrough pain  Hypertension: Controlled-continue lisinopril  Insulin-dependent DM-2:  Noted to have some hyperglycemia and will advance diet soon.  Will increase SSI coverage to moderate.  Dyslipidemia: Continue statin    DVT prophylaxis: Lovenox Code Status: Full Family Communication: None at bedside Disposition Plan: Repeat imaging today with renal ultrasound based on persistent flank pain.  Try to advance diet.  Continue IV Rocephin.  May consider for discharge in the next 1 to 2 days based on clinical improvement.   Consultants:   None  Procedures:   None  Antimicrobials:   IV Rocephin 12/24->   Subjective: Patient seen and evaluated today  with ongoing bilateral flank pain that is worrisome.  She is able to tolerate full liquids this afternoon, although she still feels somewhat nauseous.  She would like to try to have something more for dinner.  No acute concerns or events noted overnight.  Objective: Vitals:   06/19/18 2138 06/19/18 2326 06/20/18 0416 06/20/18 0849  BP: (!) 185/79 (!) 162/79 (!) 160/80 135/70  Pulse: 72 79 78 74  Resp: 16  16 18   Temp: 97.8 F (36.6 C)  98.2 F (36.8 C) 98.2 F (36.8 C)  TempSrc: Oral  Oral Oral  SpO2: 100%  100% 99%  Weight:      Height:        Intake/Output Summary (Last 24 hours) at 06/20/2018 1159 Last data filed at 06/20/2018 0600 Gross per 24 hour  Intake 890.68 ml  Output 575 ml  Net 315.68 ml   Filed Weights   06/18/18 0013 06/18/18 2058 06/19/18 2024  Weight: 65.1 kg 68.4 kg 67.6 kg    Examination:  General exam: Appears calm and comfortable  Respiratory system: Clear to auscultation. Respiratory effort normal. Cardiovascular system: S1 & S2 heard, RRR. No JVD, murmurs, rubs, gallops or clicks. No pedal edema. Gastrointestinal system: Abdomen is nondistended, soft and nontender. No organomegaly or masses felt. Normal bowel sounds heard. Central nervous system: Alert and oriented. No focal neurological deficits. Extremities: Symmetric 5 x 5 power. Skin: No rashes, lesions or ulcers Psychiatry: Judgement and insight appear normal. Mood & affect appropriate.     Data Reviewed: I have personally reviewed following labs and imaging studies  CBC: Recent Labs  Lab 06/15/18 2221 06/17/18 1207 06/17/18 1241 06/18/18 0633 06/20/18 0414  WBC 7.9  --  6.4 5.8 5.4  NEUTROABS  --   --  4.0  --   --   HGB 12.5 13.9 12.5 11.0* 12.5  HCT 37.9 41.0 38.0 34.6* 36.9  MCV 86.1  --  84.8 85.4 83.1  PLT 261  --  300 262 314   Basic Metabolic Panel: Recent Labs  Lab 06/15/18 2221 06/17/18 1207 06/17/18 1241 06/18/18 0633 06/20/18 0414  NA 134* 132* 132* 137 134*    K 4.3 3.7 3.1* 3.5 3.4*  CL 99 94* 96* 104 97*  CO2 22  --  25 23 23   GLUCOSE 217* 183* 173* 191* 233*  BUN 11 11 10 9 12   CREATININE 0.77 0.50 0.58 0.58 0.54  CALCIUM 9.3  --  9.2 8.1* 9.3   GFR: Estimated Creatinine Clearance: 65.6 mL/min (by C-G formula based on SCr of 0.54 mg/dL). Liver Function Tests: Recent Labs  Lab 06/17/18 1241 06/18/18 0633  AST 16 14*  ALT 18 14  ALKPHOS 73 58  BILITOT 0.8 0.6  PROT 7.4 6.3*  ALBUMIN 3.9 3.3*   Recent Labs  Lab 06/17/18 1241  LIPASE 22   No results for input(s): AMMONIA in the last 168 hours. Coagulation Profile: No results for input(s): INR, PROTIME in the last 168 hours. Cardiac Enzymes: No results for input(s): CKTOTAL, CKMB, CKMBINDEX, TROPONINI in the last 168 hours. BNP (last 3 results) No results for input(s): PROBNP in the last 8760 hours. HbA1C: No results for input(s): HGBA1C in the last 72 hours. CBG: Recent Labs  Lab 06/19/18 1116 06/19/18 1614 06/19/18 2136 06/20/18 0725 06/20/18 1133  GLUCAP 159* 139* 160* 215* 144*   Lipid Profile: No results for input(s): CHOL, HDL, LDLCALC, TRIG, CHOLHDL, LDLDIRECT in the last 72 hours. Thyroid Function Tests: No results for input(s): TSH, T4TOTAL, FREET4, T3FREE, THYROIDAB in the last 72 hours. Anemia Panel: No results for input(s): VITAMINB12, FOLATE, FERRITIN, TIBC, IRON, RETICCTPCT in the last 72 hours. Sepsis Labs: No results for input(s): PROCALCITON, LATICACIDVEN in the last 168 hours.  Recent Results (from the past 240 hour(s))  Urine culture     Status: None   Collection Time: 06/17/18 12:41 PM  Result Value Ref Range Status   Specimen Description URINE, RANDOM  Final   Special Requests NONE  Final   Culture   Final    NO GROWTH Performed at [facility], [address] [place], [place] [address]    Report Status 06/18/2018 FINAL  Final         Radiology Studies: No results found.      Scheduled Meds: . atorvastatin  20 mg  Oral Daily  . cholecalciferol  1,000 Units Oral Daily  . docusate sodium  200 mg Oral QHS  . enoxaparin (LOVENOX) injection  40 mg Subcutaneous Q24H  . insulin aspart  0-9 Units Subcutaneous TID WC  . lisinopril  10 mg Oral Daily  . polyethylene glycol  17 g Oral Daily   Continuous Infusions: . sodium chloride Stopped (06/19/18 2328)  . cefTRIAXone (ROCEPHIN)  IV 1 g (06/19/18 1737)     LOS: 2 days    Time spent: 30 minutes    Pratik Hoover Brunette Shah, DO [facility] Pager [phone]  If 7PM-7AM, please contact night-coverage [email or web address] Password [email or web address] 06/20/2018, 11:59 AM

## 2018-06-20 NOTE — Progress Notes (Signed)
Inpatient Diabetes Program Recommendations  AACE/ADA: New Consensus Statement on Inpatient Glycemic Control (2015)  Target Ranges:  Prepandial:   less than 140 mg/dL      Peak postprandial:   less than 180 mg/dL (1-2 hours)      Critically ill patients:  140 - 180 mg/dL   Lab Results  Component Value Date   GLUCAP 144 (H) 06/20/2018   HGBA1C 8.6 (H) 05/30/2018    Review of Glycemic Control Results for Shannon [name], Shannon [name] (MRN [id number]) as of 06/20/2018 14:10  Ref. Range 06/19/2018 11:16 06/19/2018 16:14 06/19/2018 21:36 06/20/2018 07:25 06/20/2018 11:33  Glucose-Capillary Latest Ref Range: 70 - 99 mg/dL 409159 (H) 811139 (H) 914160 (H) 215 (H) 144 (H)   Diabetes history: DM 2 Outpatient Diabetes medications:  Tresiba 21 units daily Current orders for Inpatient glycemic control:  Novolog sensitive tid with meals Inpatient Diabetes Program Recommendations:   May consider adding 1/2 of home basal insulin dose.  Consider Lantus 10 units daily.   Thanks,  Beryl MeagerJenny Grenda Lora, RN, BC-ADM Inpatient Diabetes Coordinator Pager [phone] (8a-5p)

## 2018-06-21 LAB — CBC
HCT: 36.4 % (ref 36.0–46.0)
Hemoglobin: 12.1 g/dL (ref 12.0–15.0)
MCH: 27.6 pg (ref 26.0–34.0)
MCHC: 33.2 g/dL (ref 30.0–36.0)
MCV: 82.9 fL (ref 80.0–100.0)
Platelets: 299 10*3/uL (ref 150–400)
RBC: 4.39 MIL/uL (ref 3.87–5.11)
RDW: 13.1 % (ref 11.5–15.5)
WBC: 6.2 10*3/uL (ref 4.0–10.5)
nRBC: 0 % (ref 0.0–0.2)

## 2018-06-21 LAB — BASIC METABOLIC PANEL
Anion gap: 14 (ref 5–15)
BUN: 13 mg/dL (ref 8–23)
CO2: 24 mmol/L (ref 22–32)
Calcium: 9.2 mg/dL (ref 8.9–10.3)
Chloride: 98 mmol/L (ref 98–111)
Creatinine, Ser: 0.53 mg/dL (ref 0.44–1.00)
GFR calc Af Amer: >60 mL/min (ref >60)
GFR calc non Af Amer: >60 mL/min (ref >60)
Glucose, Bld: 229 mg/dL — ABNORMAL HIGH (ref 70–99)
Potassium: 3.4 mmol/L — ABNORMAL LOW (ref 3.5–5.1)
Sodium: 136 mmol/L (ref 135–145)

## 2018-06-21 LAB — GLUCOSE, CAPILLARY
Glucose-Capillary: 228 mg/dL — ABNORMAL HIGH (ref 70–99)
Glucose-Capillary: 171 mg/dL — ABNORMAL HIGH (ref 70–99)

## 2018-06-21 MED ORDER — CEFDINIR 300 MG PO CAPS
300.0000 mg | ORAL_CAPSULE | Freq: Two times a day (BID) | ORAL | Status: DC
  Administered 2018-06-21: 300 mg via ORAL
  Filled 2018-06-21: qty 1

## 2018-06-21 MED ORDER — CEFDINIR 300 MG PO CAPS: 300.0000 mg | ORAL_CAPSULE | Freq: Two times a day (BID) | ORAL | 0 refills | Status: AC

## 2018-06-21 MED ORDER — METHOCARBAMOL 500 MG PO TABS
500.0000 mg | ORAL_TABLET | Freq: Three times a day (TID) | ORAL | Status: DC
  Administered 2018-06-21: 500 mg via ORAL
  Filled 2018-06-21: qty 1

## 2018-06-21 MED ORDER — METHOCARBAMOL 500 MG PO TABS: 500.0000 mg | ORAL_TABLET | Freq: Three times a day (TID) | ORAL | 0 refills | Status: AC

## 2018-06-21 MED ORDER — DOCUSATE SODIUM 100 MG PO CAPS: 200.0000 mg | ORAL_CAPSULE | Freq: Every day | ORAL | 0 refills | Status: DC

## 2018-06-21 MED ORDER — ONDANSETRON HCL 4 MG PO TABS: 4.0000 mg | ORAL_TABLET | Freq: Four times a day (QID) | ORAL | 0 refills | Status: DC | PRN

## 2018-06-21 MED ORDER — HYDROCODONE-ACETAMINOPHEN 5-325 MG PO TABS: 1.0000 | ORAL_TABLET | Freq: Three times a day (TID) | ORAL | 0 refills | Status: AC | PRN

## 2018-06-21 MED ORDER — POLYETHYLENE GLYCOL 3350 17 G PO PACK: 17.0000 g | PACK | Freq: Every day | ORAL | 0 refills | Status: DC

## 2018-06-25 NOTE — Discharge Summary (Signed)
[facility] Discharge Summary   Patient: Shannon [name] ZMC:[id number]   PCP: Dorothyann Peng, MD DOB: March 16, 1956   Date of admission: 06/17/2018   Date of discharge: 06/21/2018     Discharge Diagnoses:  Active Problems:   Pyelonephritis  Admitted From: home Disposition:  home  Recommendations for Outpatient Follow-up:  1. Please follow-up with PCP in 1 week.  Follow-up Information    Dorothyann Peng, MD. Schedule an appointment as soon as possible for a visit in 1 week(s).   Specialty:  Internal Medicine Contact information: [address] [place] [address] [phone]          Diet recommendation: home  Activity: The patient is advised to gradually reintroduce usual activities.  Discharge Condition: good  Code Status: full code  History of present illness: As per the H and P dictated on admission, "Shannon [name] is a 63 y.o. female with medical history significant of type 2 diabetes mellitus, hyperlipidemia, hypertension who presents to the hospital with persistent nausea vomiting as well as bilateral flank pain right more than the left.  Patient has been having UTI type symptoms with burning with urination as well as flank pain over the last 3 to 4 days, she was evaluated in the emergency room 2 days ago and was given antibiotics, however despite taking oral antibiotics her symptoms have progressed, she is feeling sicker, she has been having profuse nausea and vomiting and inability to keep any oral intake and decided to come back to the emergency room.  She is also been complaining of worsening bilateral flank pain right more than left.  She is complained of subjective fever and chills.  She is also complaining of bilateral inguinal pain, worse on the left.  Denies any chest pain, denies any palpitations.  Her diabetes is generally well controlled however in the last couple days her CBGs have run higher than her normal self  ED Course: In the emergency  room patient is afebrile, otherwise her vital signs are stable.  Blood work reveals normal renal function, normal CBC.  K is 3.1, sodium 132.  She underwent a CT renal stone study 2 days ago on her prior ED visit did not show anything acute."  Hospital Course:  Summary of her active problems in the hospital is as following.  Bilateral pyelonephritis: Clinically- history consistent with UTI/pyelonephritis urine cultures are negative as she was already on 2 antimicrobial therapy as outpatient before cultures were obtained.  Seems to be slowly improving-afebrile-no leukocytosis-no longer vomiting -Treated with Rocephin, will transition to oral Omnicef -renal ultrasound and CT renal protocol both negative for any acute abnormality, obstructive uropathy, renal abscess   Hypertension: Controlled-continue lisinopril  Insulin-dependent DM-2 uncontrolled, without any complication: Noted to have some hyperglycemia  Continue home regimen.  Dyslipidemia: Continue statin  All other chronic medical condition were stable during the hospitalization.  Patient was ambulatory without any assistance. On the day of the discharge the patient's vitals were stable , and no other acute medical condition were reported by patient. the patient was felt safe to be discharge at home  with family.  Consultants: none Procedures: noen  DISCHARGE MEDICATION: Allergies as of 06/21/2018   No Known Allergies     Medication List    STOP taking these medications   cephALEXin 500 MG capsule Commonly known as:  KEFLEX     TAKE these medications   acetaminophen 500 MG tablet Commonly known as:  TYLENOL Take 500-1,000 mg by mouth every  6 (six) hours as needed for headache (or pain).   atorvastatin 20 MG tablet Commonly known as:  LIPITOR Take 20 mg by mouth daily.   docusate sodium 100 MG capsule Commonly known as:  COLACE Take 2 capsules (200 mg total) by mouth at bedtime.   HYDROcodone-acetaminophen  5-325 MG tablet Commonly known as:  NORCO/VICODIN Take 1 tablet by mouth every 8 (eight) hours as needed for up to 5 days for moderate pain. What changed:    when to take this  reasons to take this   lisinopril 10 MG tablet Commonly known as:  PRINIVIL,ZESTRIL Take 10 mg by mouth daily.   methocarbamol 500 MG tablet Commonly known as:  ROBAXIN Take 1 tablet (500 mg total) by mouth 3 (three) times daily for 4 days.   ondansetron 4 MG disintegrating tablet Commonly known as:  ZOFRAN ODT Take 1 tablet (4 mg total) by mouth every 8 (eight) hours as needed. What changed:  reasons to take this   ondansetron 4 MG tablet Commonly known as:  ZOFRAN Take 1 tablet (4 mg total) by mouth every 6 (six) hours as needed for nausea.   OZEMPIC (0.25 OR 0.5 MG/DOSE) 2 MG/1.5ML Sopn Generic drug:  Semaglutide(0.25 or 0.5MG /DOS) Inject 0.5 mg into the skin every Monday. Into the thigh, abdomen or upper arm on the same day each week rotating injection sites   polyethylene glycol packet Commonly known as:  MIRALAX / GLYCOLAX Take 17 g by mouth daily.   TRESIBA FLEXTOUCH 100 UNIT/ML Sopn FlexTouch Pen Generic drug:  insulin degludec INJECT 21 UNITS SUBCUTANEOUSLY AS PER INSULIN PROTOCOL What changed:  See the new instructions.   VITAMIN D-3 PO Take 1 capsule by mouth daily.     ASK your doctor about these medications   cefdinir 300 MG capsule Commonly known as:  OMNICEF Take 1 capsule (300 mg total) by mouth 2 (two) times daily for 3 days. Ask about: Should I take this medication?      No Known Allergies Discharge Instructions    Diet - low sodium heart healthy   Complete by:  As directed    Discharge instructions   Complete by:  As directed    It is important that you read following instructions as well as go over your medication list with RN to help you understand your care after this hospitalization.  Discharge Instructions: Please follow-up with PCP in one week  Please  request your primary care physician to go over all Hospital Tests and Procedure/Radiological results at the follow up,  Please get all Hospital records sent to your PCP by signing hospital release before you go home.   Do not drive, operating heavy machinery, perform activities at heights, swimming or participation in water activities or provide baby sitting services because you are on Pain, Sleep and Anxiety Medications; until you have been seen by Primary Care Physician or a Neurologist and advised to do so again. Do not take more than prescribed Pain, Sleep and Anxiety Medications. You were cared for by a hospitalist during your hospital stay. If you have any questions about your discharge medications or the care you received while you were in the hospital after you are discharged, you can call the unit you were admitted to and ask to speak with the hospitalist on call if the hospitalist that took care of you is not available.  Once you are discharged, your primary care physician will handle any further medical issues. Please note that NO REFILLS  for any discharge medications will be authorized once you are discharged, as it is imperative that you return to your primary care physician (or establish a relationship with a primary care physician if you do not have one) for your aftercare needs so that they can reassess your need for medications and monitor your lab values. You Must read complete instructions/literature along with all the possible adverse reactions/side effects for all the Medicines you take and that have been prescribed to you. Take any new Medicines after you have completely understood and accept all the possible adverse reactions/side effects. Wear Seat belts while driving. If you have smoked or chewed Tobacco in the last 2 yrs please stop smoking and/or stop any Recreational drug use.   Increase activity slowly   Complete by:  As directed      Discharge Exam: Filed Weights    06/18/18 2058 06/19/18 2024 06/20/18 2036  Weight: 68.4 kg 67.6 kg 68 kg   Vitals:   06/21/18 0429 06/21/18 0855  BP: (!) 161/84 (!) 166/76  Pulse: 77 79  Resp: 16 18  Temp: 98 F (36.7 C) 97.9 F (36.6 C)  SpO2: 100% 100%   General: Appear in no distress, no Rash; Oral Mucosa moist. Cardiovascular: S1 and S2 Present, no Murmur, no JVD Respiratory: Bilateral Air entry present and Clear to Auscultation, no Crackles, no wheezes Abdomen: Bowel Sound present, Soft and no tenderness Extremities: no Pedal edema, no calf tenderness Neurology: Grossly no focal neuro deficit.  The results of significant diagnostics from this hospitalization (including imaging, microbiology, ancillary and laboratory) are listed below for reference.    Significant Diagnostic Studies: [place] Renal  Result Date: 06/20/2018 CLINICAL DATA:  Pyelonephritis. EXAM: RENAL / URINARY TRACT ULTRASOUND COMPLETE COMPARISON:  CT scan of June 15, 2018. FINDINGS: Right Kidney: Renal measurements: 11.3 x 5.3 x 5.3 cm = volume: 165 mL . Echogenicity within normal limits. No mass or hydronephrosis visualized. Left Kidney: Renal measurements: 12.2 x 6.5 x 5.2 cm = volume: 213 mL. Echogenicity within normal limits. No mass or hydronephrosis visualized. Bladder: Appears normal for degree of bladder distention. Bilateral ureteral jets are noted. IMPRESSION: Normal renal ultrasound. Electronically Signed   By: Lupita RaiderJames  Green Jr, M.D.   On: 06/20/2018 21:22   Ct Renal Stone Study  Result Date: 06/15/2018 CLINICAL DATA:  Acute onset of right flank pain and generalized abdominal pain. EXAM: CT ABDOMEN AND PELVIS WITHOUT CONTRAST TECHNIQUE: Multidetector CT imaging of the abdomen and pelvis was performed following the standard protocol without IV contrast. COMPARISON:  None. FINDINGS: Lower chest: Minimal scarring is noted at the lung bases. The visualized portions of the mediastinum are unremarkable. Hepatobiliary: The liver is unremarkable  in appearance. The gallbladder is unremarkable in appearance. The common bile duct remains normal in caliber. Pancreas: The pancreas is within normal limits. Spleen: The spleen is unremarkable in appearance. Adrenals/Urinary Tract: The adrenal glands are unremarkable in appearance. The kidneys are within normal limits. There is no evidence of hydronephrosis. No renal or ureteral stones are identified. No perinephric stranding is seen. Stomach/Bowel: The stomach is unremarkable in appearance. The small bowel is within normal limits. The appendix is normal in caliber, without evidence of appendicitis. The colon is unremarkable in appearance. Vascular/Lymphatic: Scattered calcification is seen along the abdominal aorta and its branches. The abdominal aorta is otherwise grossly unremarkable. The inferior vena cava is grossly unremarkable. No retroperitoneal lymphadenopathy is seen. No pelvic sidewall lymphadenopathy is identified. Reproductive: The bladder is mildly distended and grossly  unremarkable. The patient is status post hysterectomy. No suspicious adnexal masses are seen. Other: No additional soft tissue abnormalities are seen. Musculoskeletal: No acute osseous abnormalities are identified. The visualized musculature is unremarkable in appearance. IMPRESSION: No acute abnormality seen within the abdomen or pelvis. Aortic Atherosclerosis (ICD10-I70.0). Electronically Signed   By: Roanna Raider M.D.   On: 06/15/2018 22:47    Microbiology: Recent Results (from the past 240 hour(s))  Urine culture     Status: None   Collection Time: 06/17/18 12:41 PM  Result Value Ref Range Status   Specimen Description URINE, RANDOM  Final   Special Requests NONE  Final   Culture   Final    NO GROWTH Performed at [facility], [address], [place], [place] [address]    Report Status 06/18/2018 FINAL  Final     Labs: CBC: Recent Labs  Lab 06/18/18 0633 06/20/18 0414 06/21/18 0321  WBC 5.8 5.4 6.2    HGB 11.0* 12.5 12.1  HCT 34.6* 36.9 36.4  MCV 85.4 83.1 82.9  PLT 262 314 299   Basic Metabolic Panel: Recent Labs  Lab 06/18/18 0633 06/20/18 0414 06/21/18 0321  NA 137 134* 136  K 3.5 3.4* 3.4*  CL 104 97* 98  CO2 23 23 24   GLUCOSE 191* 233* 229*  BUN 9 12 13   CREATININE 0.58 0.54 0.53  CALCIUM 8.1* 9.3 9.2   Liver Function Tests: Recent Labs  Lab 06/18/18 0633  AST 14*  ALT 14  ALKPHOS 58  BILITOT 0.6  PROT 6.3*  ALBUMIN 3.3*   No results for input(s): LIPASE, AMYLASE in the last 168 hours. No results for input(s): AMMONIA in the last 168 hours. Cardiac Enzymes: No results for input(s): CKTOTAL, CKMB, CKMBINDEX, TROPONINI in the last 168 hours. BNP (last 3 results) No results for input(s): BNP in the last 8760 hours. CBG: Recent Labs  Lab 06/20/18 1133 06/20/18 1638 06/20/18 2203 06/21/18 0731 06/21/18 1121  GLUCAP 144* 150* 184* 228* 171*   Time spent: 35 minutes  Signed:  Lynden Oxford  [facility] 06/21/2018  , 6:12 AM

## 2018-06-26 ENCOUNTER — Ambulatory Visit (INDEPENDENT_AMBULATORY_CARE_PROVIDER_SITE_OTHER): Payer: PRIVATE HEALTH INSURANCE | Admitting: Nurse Practitioner

## 2018-06-26 ENCOUNTER — Encounter: Payer: Self-pay | Admitting: Nurse Practitioner

## 2018-06-26 VITALS — BP 124/80 | HR 67 | Temp 97.7°F | Ht 62.6 in | Wt 151.6 lb

## 2018-06-26 DIAGNOSIS — Z87898 Personal history of other specified conditions: Secondary | ICD-10-CM

## 2018-06-26 DIAGNOSIS — M62838 Other muscle spasm: Secondary | ICD-10-CM

## 2018-06-26 DIAGNOSIS — Z09 Encounter for follow-up examination after completed treatment for conditions other than malignant neoplasm: Secondary | ICD-10-CM

## 2018-06-26 DIAGNOSIS — N10 Acute tubulo-interstitial nephritis: Secondary | ICD-10-CM

## 2018-06-26 DIAGNOSIS — N12 Tubulo-interstitial nephritis, not specified as acute or chronic: Secondary | ICD-10-CM

## 2018-06-26 LAB — POCT URINALYSIS DIPSTICK
Bilirubin, UA: NEGATIVE
Blood, UA: NEGATIVE
Glucose, UA: POSITIVE — AB
Ketones, UA: NEGATIVE
Leukocytes, UA: NEGATIVE
Nitrite, UA: NEGATIVE
Protein, UA: POSITIVE — AB
Spec Grav, UA: 1.025 (ref 1.010–1.025)
Urobilinogen, UA: 0.2 E.U./dL
pH, UA: 6 (ref 5.0–8.0)

## 2018-06-26 MED ORDER — METHOCARBAMOL 500 MG PO TABS: 500.0000 mg | ORAL_TABLET | Freq: Three times a day (TID) | ORAL | 0 refills | Status: DC | PRN

## 2018-06-26 NOTE — Progress Notes (Signed)
Subjective:     Patient ID: Shannon [name] , female    DOB: 1956-04-01 , 63 y.o.   MRN: [id number]   Chief Complaint  Patient presents with  . Hospitalization Follow-up    HPI  She is here today for hospital follow up after going to Urgent Care treated for UTI with Cipro, began to get worse went to ER given Keflex. Had IVF and Rocephin at [facility].  Then on Christmas eve was admitted with pyelonephritis, treated with Rocephin, IVF and clear liquid diet.  She continues to feel week. She has completed her Cefdinir.    Blood sugar was up to 202.  While hospitalized her blood pressure was elevated.    Past Medical History:  Diagnosis Date  . Diabetes mellitus without complication (HCC)   . Hypercholesteremia   . Hypertension      Family History  Problem Relation Age of Onset  . Hypertension Mother   . Diabetes Mother   . Diabetes Father   . Hypertension Father   . Hypertension Son   . Diabetes Son      Current Outpatient Medications:  .  acetaminophen (TYLENOL) 500 MG tablet, Take 500-1,000 mg by mouth every 6 (six) hours as needed for headache (or pain)., Disp: , Rfl:  .  atorvastatin (LIPITOR) 20 MG tablet, Take 20 mg by mouth daily., Disp: , Rfl:  .  Cholecalciferol (VITAMIN D-3 PO), Take 1 capsule by mouth daily., Disp: , Rfl:  .  docusate sodium (COLACE) 100 MG capsule, Take 2 capsules (200 mg total) by mouth at bedtime., Disp: 10 capsule, Rfl: 0 .  HYDROcodone-acetaminophen (NORCO/VICODIN) 5-325 MG tablet, Take 1 tablet by mouth every 8 (eight) hours as needed for up to 5 days for moderate pain., Disp: 15 tablet, Rfl: 0 .  lisinopril (PRINIVIL,ZESTRIL) 10 MG tablet, Take 10 mg by mouth daily., Disp: , Rfl:  .  methocarbamol (ROBAXIN) 500 MG tablet, Take 500 mg by mouth every 8 (eight) hours as needed for muscle spasms., Disp: , Rfl:  .  ondansetron (ZOFRAN ODT) 4 MG disintegrating tablet, Take 1 tablet (4 mg total) by mouth every 8 (eight) hours as needed. (Patient  taking differently: Take 4 mg by mouth every 8 (eight) hours as needed for nausea or vomiting. ), Disp: 10 tablet, Rfl: 0 .  polyethylene glycol (MIRALAX / GLYCOLAX) packet, Take 17 g by mouth daily., Disp: 14 each, Rfl: 0 .  Semaglutide,0.25 or 0.5MG /DOS, (OZEMPIC, 0.25 OR 0.5 MG/DOSE,) 2 MG/1.5ML SOPN, Inject 0.5 mg into the skin every Monday. Into the thigh, abdomen or upper arm on the same day each week rotating injection sites, Disp: , Rfl:  .  TRESIBA FLEXTOUCH 100 UNIT/ML SOPN FlexTouch Pen, INJECT 21 UNITS SUBCUTANEOUSLY AS PER INSULIN PROTOCOL (Patient taking differently: Inject 21 Units into the skin daily after breakfast. ), Disp: 5 pen, Rfl: 1   No Known Allergies   Review of Systems  Constitutional: Negative.   Respiratory: Negative.   Cardiovascular: Negative.   Musculoskeletal: Positive for back pain (low back pain).  Skin: Negative.   Neurological: Negative for dizziness and headaches.     Today's Vitals   06/26/18 1205  BP: 124/80  Pulse: 67  Temp: 97.7 F (36.5 C)  Weight: 151 lb 9.6 oz (68.8 kg)  Height: 5' 2.6" (1.59 m)  PainSc: 0-No pain   Body mass index is 27.2 kg/m.   Objective:  Physical Exam Constitutional:      Appearance: Normal appearance.  Cardiovascular:  Rate and Rhythm: Normal rate and regular rhythm.     Pulses: Normal pulses.     Heart sounds: Normal heart sounds. No murmur.  Pulmonary:     Effort: Pulmonary effort is normal. No respiratory distress.     Breath sounds: Normal breath sounds.  Neurological:     General: No focal deficit present.     Mental Status: She is alert and oriented to person, place, and time.  Psychiatric:        Mood and Affect: Mood normal.         Assessment And Plan:      1. Pyelonephritis  Hospitalized from 12/24-12/27 with pyelonephritis, she was treated with cipro, keflex and finally rocephin and cefdinir.   Will recheck her UA today, which was normal.  2. Muscle spasm  Continue with muscle  relaxer and stretch regularly - methocarbamol (ROBAXIN) 500 MG tablet; Take 1 tablet (500 mg total) by mouth every 8 (eight) hours as needed for muscle spasms.  Dispense: 20 tablet; Refill: 0       Arnette FeltsJanece Russell Engelstad, FNP

## 2018-08-29 ENCOUNTER — Encounter: Payer: Self-pay | Admitting: Nurse Practitioner

## 2018-08-29 ENCOUNTER — Ambulatory Visit: Payer: PRIVATE HEALTH INSURANCE | Admitting: Nurse Practitioner

## 2018-08-29 VITALS — BP 128/80 | HR 81 | Ht 62.0 in | Wt 162.8 lb

## 2018-08-29 DIAGNOSIS — E119 Type 2 diabetes mellitus without complications: Secondary | ICD-10-CM

## 2018-08-29 DIAGNOSIS — I10 Essential (primary) hypertension: Secondary | ICD-10-CM | POA: Diagnosis not present

## 2018-08-29 DIAGNOSIS — Z794 Long term (current) use of insulin: Secondary | ICD-10-CM

## 2018-08-29 DIAGNOSIS — Z91128 Patient's intentional underdosing of medication regimen for other reason: Secondary | ICD-10-CM

## 2018-08-29 MED ORDER — SEMAGLUTIDE(0.25 OR 0.5MG/DOS) 2 MG/1.5ML ~~LOC~~ SOPN: 0.2500 mg | PEN_INJECTOR | SUBCUTANEOUS | 3 refills | Status: DC

## 2018-08-29 NOTE — Progress Notes (Signed)
Subjective:     Patient ID: Shannon [name] , female    DOB: 23-Jan-1956 , 63 y.o.   MRN: [id number]   Chief Complaint  Patient presents with  . Diabetes    dm check no other concerns     HPI  She has increased weight since not taking the Ozempic.    Diabetes  She presents for her follow-up diabetic visit. She has type 2 diabetes mellitus. Her disease course has been stable. Pertinent negatives for hypoglycemia include no confusion, dizziness or nervousness/anxiousness. Pertinent negatives for diabetes include no blurred vision, no chest pain, no fatigue, no polydipsia, no polyphagia and no polyuria. There are no hypoglycemic complications. Symptoms are stable. There are no diabetic complications. Risk factors for coronary artery disease include diabetes mellitus, hypertension and obesity. Current diabetic treatment includes oral agent (dual therapy). She is compliant with treatment all of the time (has been without Ozempic). She is following a generally healthy diet. When asked about meal planning, she reported none. She has not had a previous visit with a dietitian. She participates in exercise every other day (aerobics, swim and yoga). Her home blood glucose trend is increasing steadily. Her overall blood glucose range is 140-180 mg/dl. An ACE inhibitor/angiotensin II receptor blocker is being taken. She does not see a podiatrist.Eye exam current: she is due in the next few months.  Hypertension  This is a chronic problem. The current episode started more than 1 year ago. The problem is unchanged. The problem is controlled. Pertinent negatives include no blurred vision or chest pain. Risk factors for coronary artery disease include diabetes mellitus, obesity and sedentary lifestyle. Past treatments include ACE inhibitors. The current treatment provides significant improvement. There are no compliance problems.  There is no history of angina. There is no history of chronic renal disease.      Past Medical History:  Diagnosis Date  . Diabetes mellitus without complication (HCC)   . Hypercholesteremia   . Hypertension      Family History  Problem Relation Age of Onset  . Hypertension Mother   . Diabetes Mother   . Diabetes Father   . Hypertension Father   . Hypertension Son   . Diabetes Son      Current Outpatient Medications:  .  acetaminophen (TYLENOL) 500 MG tablet, Take 500-1,000 mg by mouth every 6 (six) hours as needed for headache (or pain)., Disp: , Rfl:  .  atorvastatin (LIPITOR) 20 MG tablet, Take 20 mg by mouth daily., Disp: , Rfl:  .  Cholecalciferol (VITAMIN D-3 PO), Take 1 capsule by mouth daily., Disp: , Rfl:  .  lisinopril (PRINIVIL,ZESTRIL) 10 MG tablet, Take 10 mg by mouth daily., Disp: , Rfl:  .  metFORMIN (GLUCOPHAGE) 500 MG tablet, metformin 500 mg tablet  TAKE ONE TABLET BY MOUTH EVERY DAY WITH MORNING AND EVENING MEALS., Disp: , Rfl:  .  polyethylene glycol (MIRALAX / GLYCOLAX) packet, Take 17 g by mouth daily., Disp: 14 each, Rfl: 0 .  Semaglutide,0.25 or 0.5MG /DOS, (OZEMPIC, 0.25 OR 0.5 MG/DOSE,) 2 MG/1.5ML SOPN, Inject 0.5 mg into the skin every Monday. Into the thigh, abdomen or upper arm on the same day each week rotating injection sites, Disp: , Rfl:  .  TRESIBA FLEXTOUCH 100 UNIT/ML SOPN FlexTouch Pen, INJECT 21 UNITS SUBCUTANEOUSLY AS PER INSULIN PROTOCOL (Patient taking differently: Inject 21 Units into the skin daily after breakfast. ), Disp: 5 pen, Rfl: 1 .  methocarbamol (ROBAXIN) 500 MG tablet, Take 1 tablet (500  mg total) by mouth every 8 (eight) hours as needed for muscle spasms. (Patient not taking: Reported on 08/29/2018), Disp: 20 tablet, Rfl: 0   No Known Allergies   Review of Systems  Constitutional: Negative.  Negative for fatigue.  Eyes: Negative for blurred vision and visual disturbance.  Respiratory: Negative.   Cardiovascular: Negative.  Negative for chest pain.  Gastrointestinal: Negative.   Endocrine: Negative.   Negative for polydipsia, polyphagia and polyuria.  Skin: Negative.   Neurological: Negative.  Negative for dizziness.  Psychiatric/Behavioral: Negative for confusion. The patient is not nervous/anxious.      Today's Vitals   08/29/18 0941  BP: 128/80  Pulse: 81  SpO2: 98%  Weight: 162 lb 12.8 oz (73.8 kg)  Height: 5\' 2"  (1.575 m)   Body mass index is 29.78 kg/m.   Objective:  Physical Exam Vitals signs reviewed.  Constitutional:      Appearance: She is well-developed.  Neck:     Musculoskeletal: Normal range of motion and neck supple.  Cardiovascular:     Rate and Rhythm: Normal rate and regular rhythm.     Heart sounds: Normal heart sounds. No murmur.  Pulmonary:     Effort: Pulmonary effort is normal.     Breath sounds: Normal breath sounds.  Chest:     Chest wall: No tenderness.  Musculoskeletal: Normal range of motion.  Skin:    General: Skin is warm and dry.     Capillary Refill: Capillary refill takes less than 2 seconds.  Neurological:     Mental Status: She is alert and oriented to person, place, and time.  Psychiatric:        Mood and Affect: Mood normal.        Behavior: Behavior normal.        Thought Content: Thought content normal.        Judgment: Judgment normal.         Assessment And Plan:     1. Type 2 diabetes mellitus without complication, with long-term current use of insulin (HCC) Chronic, poorly controlled She has not been taking the ozempic for the last couple of months due to insurance and also noticed her weight is up Continue with current medications Encouraged to limit intake of sugary foods and drinks Encouraged to increase physical activity to 150 minutes per week - Lipid panel - TSH - Semaglutide,0.25 or 0.5MG /DOS, (OZEMPIC, 0.25 OR 0.5 MG/DOSE,) 2 MG/1.5ML SOPN; Inject 0.25 mg into the skin once a week.  Dispense: 1 pen; Refill: 3  2. Essential hypertension . B/P is controlled.  . CMP ordered to check renal function.  . The  importance of regular exercise and dietary modification was stressed to the patient. . She is also a night shift worker  - CMP14 + Anion Gap - Hemoglobin A1c       Arnette Felts, FNP

## 2018-08-30 LAB — CMP14 + ANION GAP
ALT: 20 IU/L (ref 0–32)
AST: 12 IU/L (ref 0–40)
Albumin/Globulin Ratio: 1.7 (ref 1.2–2.2)
Albumin: 4.6 g/dL (ref 3.8–4.8)
Alkaline Phosphatase: 106 IU/L (ref 39–117)
Anion Gap: 17 mmol/L (ref 10.0–18.0)
BUN/Creatinine Ratio: 22 (ref 12–28)
BUN: 15 mg/dL (ref 8–27)
Bilirubin Total: <0.2 mg/dL (ref 0.0–1.2)
CO2: 23 mmol/L (ref 20–29)
Calcium: 9.6 mg/dL (ref 8.7–10.3)
Chloride: 98 mmol/L (ref 96–106)
Creatinine, Ser: 0.69 mg/dL (ref 0.57–1.00)
GFR calc Af Amer: 106 mL/min/{1.73_m2} (ref >59)
GFR calc non Af Amer: 92 mL/min/{1.73_m2} (ref >59)
Globulin, Total: 2.7 g/dL (ref 1.5–4.5)
Glucose: 429 mg/dL — ABNORMAL HIGH (ref 65–99)
Potassium: 4.3 mmol/L (ref 3.5–5.2)
Sodium: 138 mmol/L (ref 134–144)
Total Protein: 7.3 g/dL (ref 6.0–8.5)

## 2018-08-30 LAB — HEMOGLOBIN A1C
Est. average glucose Bld gHb Est-mCnc: 292 mg/dL
Hgb A1c MFr Bld: 11.8 % — ABNORMAL HIGH (ref 4.8–5.6)

## 2018-08-30 LAB — LIPID PANEL
Chol/HDL Ratio: 4.1 ratio (ref 0.0–4.4)
Cholesterol, Total: 210 mg/dL — ABNORMAL HIGH (ref 100–199)
HDL: 51 mg/dL (ref >39)
LDL Calculated: 89 mg/dL (ref 0–99)
Triglycerides: 350 mg/dL — ABNORMAL HIGH (ref 0–149)
VLDL Cholesterol Cal: 70 mg/dL — ABNORMAL HIGH (ref 5–40)

## 2018-08-30 LAB — TSH: TSH: 0.797 u[IU]/mL (ref 0.450–4.500)

## 2018-09-16 ENCOUNTER — Encounter: Payer: Self-pay | Admitting: Nurse Practitioner

## 2018-10-10 ENCOUNTER — Telehealth: Payer: Self-pay

## 2018-10-10 NOTE — Telephone Encounter (Signed)
Pt and pharm notified of approval

## 2018-10-10 NOTE — Telephone Encounter (Signed)
Pt and pharm notified of approval of ozempic cost to pt is $24.99

## 2018-10-12 ENCOUNTER — Inpatient Hospital Stay (HOSPITAL_COMMUNITY)
Admission: EM | Admit: 2018-10-12 | Discharge: 2018-10-18 | DRG: 177 | Disposition: A | Discharge status: Home / Self Care | Payer: PRIVATE HEALTH INSURANCE | Attending: Internal Medicine | Admitting: Internal Medicine

## 2018-10-12 ENCOUNTER — Emergency Department (HOSPITAL_COMMUNITY): Payer: PRIVATE HEALTH INSURANCE

## 2018-10-12 ENCOUNTER — Other Ambulatory Visit: Payer: Self-pay

## 2018-10-12 ENCOUNTER — Encounter (HOSPITAL_COMMUNITY): Payer: Self-pay

## 2018-10-12 DIAGNOSIS — Z20822 Contact with and (suspected) exposure to covid-19: Secondary | ICD-10-CM

## 2018-10-12 DIAGNOSIS — Z87891 Personal history of nicotine dependence: Secondary | ICD-10-CM

## 2018-10-12 DIAGNOSIS — E876 Hypokalemia: Secondary | ICD-10-CM | POA: Diagnosis present

## 2018-10-12 DIAGNOSIS — Z8249 Family history of ischemic heart disease and other diseases of the circulatory system: Secondary | ICD-10-CM

## 2018-10-12 DIAGNOSIS — D6959 Other secondary thrombocytopenia: Secondary | ICD-10-CM | POA: Diagnosis present

## 2018-10-12 DIAGNOSIS — Z79899 Other long term (current) drug therapy: Secondary | ICD-10-CM

## 2018-10-12 DIAGNOSIS — E119 Type 2 diabetes mellitus without complications: Secondary | ICD-10-CM | POA: Diagnosis present

## 2018-10-12 DIAGNOSIS — I10 Essential (primary) hypertension: Secondary | ICD-10-CM | POA: Diagnosis present

## 2018-10-12 DIAGNOSIS — E785 Hyperlipidemia, unspecified: Secondary | ICD-10-CM | POA: Diagnosis present

## 2018-10-12 DIAGNOSIS — J189 Pneumonia, unspecified organism: Secondary | ICD-10-CM

## 2018-10-12 DIAGNOSIS — E871 Hypo-osmolality and hyponatremia: Secondary | ICD-10-CM | POA: Diagnosis present

## 2018-10-12 DIAGNOSIS — J1289 Other viral pneumonia: Secondary | ICD-10-CM | POA: Diagnosis present

## 2018-10-12 DIAGNOSIS — R509 Fever, unspecified: Secondary | ICD-10-CM | POA: Diagnosis not present

## 2018-10-12 DIAGNOSIS — Z794 Long term (current) use of insulin: Secondary | ICD-10-CM

## 2018-10-12 DIAGNOSIS — J181 Lobar pneumonia, unspecified organism: Secondary | ICD-10-CM

## 2018-10-12 DIAGNOSIS — R0600 Dyspnea, unspecified: Secondary | ICD-10-CM

## 2018-10-12 DIAGNOSIS — R6889 Other general symptoms and signs: Secondary | ICD-10-CM

## 2018-10-12 DIAGNOSIS — Z833 Family history of diabetes mellitus: Secondary | ICD-10-CM

## 2018-10-12 LAB — COMPREHENSIVE METABOLIC PANEL
ALT: 25 U/L (ref 0–44)
AST: 26 U/L (ref 15–41)
Albumin: 3.9 g/dL (ref 3.5–5.0)
Alkaline Phosphatase: 85 U/L (ref 38–126)
Anion gap: 13 (ref 5–15)
BUN: 10 mg/dL (ref 8–23)
CO2: 22 mmol/L (ref 22–32)
Calcium: 8.8 mg/dL — ABNORMAL LOW (ref 8.9–10.3)
Chloride: 95 mmol/L — ABNORMAL LOW (ref 98–111)
Creatinine, Ser: 0.66 mg/dL (ref 0.44–1.00)
GFR calc Af Amer: >60 mL/min (ref >60)
GFR calc non Af Amer: >60 mL/min (ref >60)
Glucose, Bld: 260 mg/dL — ABNORMAL HIGH (ref 70–99)
Potassium: 3.3 mmol/L — ABNORMAL LOW (ref 3.5–5.1)
Sodium: 130 mmol/L — ABNORMAL LOW (ref 135–145)
Total Bilirubin: 0.5 mg/dL (ref 0.3–1.2)
Total Protein: 7.6 g/dL (ref 6.5–8.1)

## 2018-10-12 LAB — CBC WITH DIFFERENTIAL/PLATELET
Abs Immature Granulocytes: 0.01 10*3/uL (ref 0.00–0.07)
Basophils Absolute: 0 10*3/uL (ref 0.0–0.1)
Basophils Relative: 1 %
Eosinophils Absolute: 0.1 10*3/uL (ref 0.0–0.5)
Eosinophils Relative: 2 %
HCT: 36.4 % (ref 36.0–46.0)
Hemoglobin: 12.1 g/dL (ref 12.0–15.0)
Immature Granulocytes: 0 %
Lymphocytes Relative: 21 %
Lymphs Abs: 1.3 10*3/uL (ref 0.7–4.0)
MCH: 28.3 pg (ref 26.0–34.0)
MCHC: 33.2 g/dL (ref 30.0–36.0)
MCV: 85 fL (ref 80.0–100.0)
Monocytes Absolute: 0.4 10*3/uL (ref 0.1–1.0)
Monocytes Relative: 6 %
Neutro Abs: 4.2 10*3/uL (ref 1.7–7.7)
Neutrophils Relative %: 70 %
Platelets: 128 10*3/uL — ABNORMAL LOW (ref 150–400)
RBC: 4.28 MIL/uL (ref 3.87–5.11)
RDW: 12.5 % (ref 11.5–15.5)
WBC: 5.9 10*3/uL (ref 4.0–10.5)
nRBC: 0 % (ref 0.0–0.2)

## 2018-10-12 LAB — LACTIC ACID, PLASMA: Lactic Acid, Venous: 2.2 mmol/L (ref 0.5–1.9)

## 2018-10-12 LAB — LACTATE DEHYDROGENASE: LDH: 173 U/L (ref 98–192)

## 2018-10-12 LAB — SARS CORONAVIRUS 2 BY RT PCR (HOSPITAL ORDER, PERFORMED IN ~~LOC~~ HOSPITAL LAB): SARS Coronavirus 2: POSITIVE — AB

## 2018-10-12 LAB — TROPONIN I: Troponin I: <0.03 ng/mL (ref <0.03)

## 2018-10-12 LAB — D-DIMER, QUANTITATIVE: D-Dimer, Quant: 0.41 ug/mL-FEU (ref 0.00–0.50)

## 2018-10-12 LAB — FIBRINOGEN: Fibrinogen: 693 mg/dL — ABNORMAL HIGH (ref 210–475)

## 2018-10-12 LAB — BRAIN NATRIURETIC PEPTIDE: B Natriuretic Peptide: 19.7 pg/mL (ref 0.0–100.0)

## 2018-10-12 LAB — PROCALCITONIN: Procalcitonin: <0.1 ng/mL

## 2018-10-12 LAB — FERRITIN: Ferritin: 528 ng/mL — ABNORMAL HIGH (ref 11–307)

## 2018-10-12 LAB — TRIGLYCERIDES: Triglycerides: 205 mg/dL — ABNORMAL HIGH (ref <150)

## 2018-10-12 LAB — C-REACTIVE PROTEIN: CRP: 5.3 mg/dL — ABNORMAL HIGH (ref <1.0)

## 2018-10-12 MED ORDER — SODIUM CHLORIDE 0.9 % IV SOLN
1.0000 g | Freq: Once | INTRAVENOUS | Status: DC
  Filled 2018-10-12: qty 10

## 2018-10-12 MED ORDER — ACETAMINOPHEN 500 MG PO TABS
1000.0000 mg | ORAL_TABLET | Freq: Once | ORAL | Status: AC
  Administered 2018-10-12: 1000 mg via ORAL
  Filled 2018-10-12: qty 2

## 2018-10-12 MED ORDER — ACETAMINOPHEN 500 MG PO TABS
ORAL_TABLET | ORAL | Status: AC
  Filled 2018-10-12: qty 1

## 2018-10-12 MED ORDER — SODIUM CHLORIDE 0.9 % IV SOLN
1000.0000 mL | INTRAVENOUS | Status: DC
  Administered 2018-10-12: 22:00:00 1000 mL via INTRAVENOUS

## 2018-10-12 MED ORDER — SODIUM CHLORIDE 0.9 % IV SOLN
500.0000 mg | Freq: Once | INTRAVENOUS | Status: AC
  Administered 2018-10-13: 500 mg via INTRAVENOUS
  Filled 2018-10-12: qty 500

## 2018-10-12 NOTE — ED Triage Notes (Signed)
Pt reports URI symptoms that started on Tuesday, got better and then worsened on Friday. She is now complaining of a cough, loss of smell and taste, headache, and weakness. Cough is not productive and she states that her lungs feel clear. Denies fever at home, but low grade fever noted in triage.

## 2018-10-12 NOTE — ED Notes (Signed)
I dropped a tylenol on the ground so TaQuita, RN had to pull an additional tablet to administer to pt.

## 2018-10-12 NOTE — ED Notes (Signed)
Bed: WA18 Expected date:  Expected time:  Means of arrival:  Comments: Neg pressure 

## 2018-10-12 NOTE — ED Provider Notes (Signed)
[facility]-EMERGENCY DEPT Provider Note   CSN: [id number] Arrival date & time: 10/12/18  1926    History   Chief Complaint Chief Complaint  Patient presents with   Cough    HPI Shannon [name] is a 63 y.o. female.     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  She reports that she became ill on Tuesday, 6 days ago.  Her symptoms were sinus-like.  She reports she had congestion and sinus drainage and sore throat.  She reports that she was out of work on Tuesday and Wednesday but then felt better by Thursday and was back at work on Friday.  She reports however by Saturday she started feeling much worse and became generally weak with severe generalized achiness, cough and significant loss of appetite.  She reports today she started to feel so weak that she temporarily passed out on her bed.  She reports that she aches all over and has increased harsh cough.  She reports cough has been been dry and harsh.  She reports her chest is starting to feel achy from coughing.  He reports blood sugars have been running between 150s and 160s.  Patient is a non-smoker. Past Medical History:  Diagnosis Date   Diabetes mellitus without complication (HCC)    Hypercholesteremia    Hypertension     Patient Active Problem List   Diagnosis Date Noted   Pyelonephritis 06/17/2018   Type 2 diabetes mellitus without complication, with long-term current use of insulin (HCC) 05/30/2018   Essential hypertension 05/30/2018    Past Surgical History:  Procedure Laterality Date   ABDOMINAL HYSTERECTOMY     CESAREAN SECTION     MYOMECTOMY       OB History   No obstetric history on file.      Home Medications    Prior to Admission medications   Medication Sig Start Date End Date Taking? Authorizing Provider  acetaminophen (TYLENOL) 500 MG tablet Take 500-1,000 mg by mouth every 6 (six) hours as needed for headache (or pain).    [provider]  atorvastatin (LIPITOR) 20 MG tablet Take 20 mg by mouth daily.    [provider]  Cholecalciferol (VITAMIN D-3 PO) Take 1 capsule by mouth daily.    [provider]  lisinopril (PRINIVIL,ZESTRIL) 10 MG tablet Take 10 mg by mouth daily.    [provider]  metFORMIN (GLUCOPHAGE) 500 MG tablet metformin 500 mg tablet  TAKE ONE TABLET BY MOUTH EVERY DAY WITH MORNING AND EVENING MEALS.    [provider]  methocarbamol (ROBAXIN) 500 MG tablet Take 1 tablet (500 mg total) by mouth every 8 (eight) hours as needed for muscle spasms. Patient not taking: Reported on 08/29/2018 06/26/18   Arnette Felts, FNP  polyethylene glycol [facility] / Ethelene Hal) packet Take 17 g by mouth daily. 06/22/18   Rolly Salter, MD  Semaglutide,0.25 or 0.5MG /DOS, (OZEMPIC, 0.25 OR 0.5 MG/DOSE,) 2 MG/1.5ML SOPN Inject 0.5 mg into the skin every Monday. Into the thigh, abdomen or upper arm on the same day each week rotating injection sites    [provider]  Semaglutide,0.25 or 0.5MG /DOS, (OZEMPIC, 0.25 OR 0.5 MG/DOSE,) 2 MG/1.5ML SOPN Inject 0.25 mg into the skin once a week. 08/29/18   Arnette Felts, FNP  TRESIBA FLEXTOUCH 100 UNIT/ML SOPN FlexTouch Pen INJECT 21 UNITS SUBCUTANEOUSLY AS PER INSULIN PROTOCOL Patient taking differently: Inject 21 Units into the skin daily after breakfast.  04/10/18   Arnette FeltsMoore, Janece, FNP    Family History Family History  Problem Relation Age of Onset   Hypertension Mother    Diabetes Mother    Diabetes Father    Hypertension Father    Hypertension Son    Diabetes Son     Social History Social History   Tobacco Use   Smoking status: Former Smoker   Smokeless tobacco: Never Used   Tobacco comment: quit 10 yrs  Substance Use Topics   Alcohol use: Yes    Comment: rarely   Drug use: Never     Allergies   Patient has no known allergies.   Review of Systems Review of Systems 10 Systems reviewed and are negative  for acute change except as noted in the HPI.   Physical Exam Updated Vital Signs BP (!) 153/72 (BP Location: Left Arm)    Pulse 93    Temp (!) 103 F (39.4 C) (Oral)    Resp (!) 22    Ht 5\' 3"  (1.6 m)    Wt 70.3 kg    SpO2 99%    BMI 27.46 kg/m   Physical Exam Constitutional:      Comments: Patient is alert with clear mental status.  She appears fatigued and mildly ill.  Mild tachypnea.  HENT:     Head: Normocephalic and atraumatic.     Mouth/Throat:     Comments: Posterior airway widely patent.  Mucous membranes slightly dry. Eyes:     Extraocular Movements: Extraocular movements intact.     Conjunctiva/sclera: Conjunctivae normal.     Pupils: Pupils are equal, round, and reactive to light.  Cardiovascular:     Pulses: Normal pulses.     Heart sounds: Normal heart sounds.     Comments: Underlying tachycardia.  No gross rub murmur gallop. Pulmonary:     Comments: Intermittent harsh cough.  Mild tachypnea.  Occasional crackle right lung field. Abdominal:     General: There is no distension.     Palpations: Abdomen is soft.     Tenderness: There is no abdominal tenderness. There is no guarding.  Musculoskeletal: Normal range of motion.        General: No swelling or tenderness.     Right lower leg: No edema.     Left lower leg: No edema.  Skin:    General: Skin is warm and dry.  Neurological:     General: No focal deficit present.     Mental Status: She is oriented to person, place, and time.     Coordination: Coordination normal.  Psychiatric:        Mood and Affect: Mood normal.      ED Treatments / Results  Labs (all labs ordered are listed, but only abnormal results are displayed) Labs Reviewed  LACTIC ACID, PLASMA - Abnormal; Notable for the following components:      Result Value   Lactic Acid, Venous 2.2 (*)    All other components within normal limits  CBC WITH DIFFERENTIAL/PLATELET - Abnormal; Notable for the following components:   Platelets 128 (*)     All other components within normal limits  COMPREHENSIVE METABOLIC PANEL - Abnormal; Notable for the following components:   Sodium 130 (*)    Potassium 3.3 (*)    Chloride 95 (*)    Glucose, Bld 260 (*)    Calcium 8.8 (*)    All other components within normal limits  FERRITIN - Abnormal; Notable for the following components:  Ferritin 528 (*)    All other components within normal limits  TRIGLYCERIDES - Abnormal; Notable for the following components:   Triglycerides 205 (*)    All other components within normal limits  FIBRINOGEN - Abnormal; Notable for the following components:   Fibrinogen 693 (*)    All other components within normal limits  C-REACTIVE PROTEIN - Abnormal; Notable for the following components:   CRP 5.3 (*)    All other components within normal limits  SARS CORONAVIRUS 2 (HOSPITAL ORDER, PERFORMED IN [facility])  CULTURE, BLOOD (ROUTINE X 2)  CULTURE, BLOOD (ROUTINE X 2)  D-DIMER, QUANTITATIVE (NOT AT [facility])  PROCALCITONIN  LACTATE DEHYDROGENASE  TROPONIN I  BRAIN NATRIURETIC PEPTIDE  LACTIC ACID, PLASMA    EKG EKG Interpretation  Date/Time:  Sunday October 12 2018 21:55:45 EDT Ventricular Rate:  93 PR Interval:    QRS Duration: 81 QT Interval:  350 QTC Calculation: 436 R Axis:   -3 Text Interpretation:  Sinus rhythm Abnormal R-wave progression, early transition normal Confirmed by Arby Barrette [phone]) on 10/12/2018 11:25:05 PM   Radiology Dg Chest Portable 1 View  Result Date: 10/12/2018 CLINICAL DATA:  Cough. EXAM: PORTABLE CHEST 1 VIEW COMPARISON:  None. FINDINGS: Heart is normal size. Left lung is clear. Peripheral opacities noted in the right upper lobe. No effusions or acute bony abnormality. IMPRESSION: Peripheral airspace opacities in the right upper lobe concerning for pneumonia. Atypical/viral infection possible. Electronically Signed   By: Charlett Nose M.D.   On: 10/12/2018 20:29    Procedures Procedures (including  critical care time)  Medications Ordered in ED Medications  0.9 %  sodium chloride infusion (1,000 mLs Intravenous New Bag/Given 10/12/18 2210)  acetaminophen (TYLENOL) 500 MG tablet (has no administration in time range)  cefTRIAXone (ROCEPHIN) 1 g in sodium chloride 0.9 % 100 mL IVPB (has no administration in time range)  azithromycin (ZITHROMAX) 500 mg in sodium chloride 0.9 % 250 mL IVPB (has no administration in time range)  acetaminophen (TYLENOL) tablet 1,000 mg (1,000 mg Oral Given 10/12/18 2230)     Initial Impression / Assessment and Plan / ED Course  I have reviewed the triage vital signs and the nursing notes.  Pertinent labs & imaging results that were available during my care of the patient were reviewed by me and considered in my medical decision making (see chart for details).        Patient has been sick for 6 days.  Symptoms started with URI symptoms.  He has progressed to harsh cough, tachypnea, generalized weakness with near syncope episode.  Chest x-ray has right upper lobe pneumonia.  Coronavirus testing pending.  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  Concern for possible coronavirus infection.  Treatment started for community-acquired pneumonia.  Plan for admission for patient with fever and pneumonia.  Final Clinical Impressions(s) / ED Diagnoses   Final diagnoses:  Community acquired pneumonia of right upper lobe of lung (HCC)  Suspected Covid-19 Virus Infection    ED Discharge Orders    None       Arby Barrette, MD 10/12/18 2359

## 2018-10-13 DIAGNOSIS — E876 Hypokalemia: Secondary | ICD-10-CM

## 2018-10-13 DIAGNOSIS — Z794 Long term (current) use of insulin: Secondary | ICD-10-CM | POA: Diagnosis not present

## 2018-10-13 DIAGNOSIS — Z833 Family history of diabetes mellitus: Secondary | ICD-10-CM | POA: Diagnosis not present

## 2018-10-13 DIAGNOSIS — D6959 Other secondary thrombocytopenia: Secondary | ICD-10-CM | POA: Diagnosis present

## 2018-10-13 DIAGNOSIS — E871 Hypo-osmolality and hyponatremia: Secondary | ICD-10-CM | POA: Diagnosis present

## 2018-10-13 DIAGNOSIS — Z79899 Other long term (current) drug therapy: Secondary | ICD-10-CM | POA: Diagnosis not present

## 2018-10-13 DIAGNOSIS — J1289 Other viral pneumonia: Secondary | ICD-10-CM | POA: Diagnosis present

## 2018-10-13 DIAGNOSIS — I10 Essential (primary) hypertension: Secondary | ICD-10-CM | POA: Diagnosis not present

## 2018-10-13 DIAGNOSIS — R509 Fever, unspecified: Secondary | ICD-10-CM | POA: Diagnosis present

## 2018-10-13 DIAGNOSIS — E119 Type 2 diabetes mellitus without complications: Secondary | ICD-10-CM | POA: Diagnosis not present

## 2018-10-13 DIAGNOSIS — E785 Hyperlipidemia, unspecified: Secondary | ICD-10-CM | POA: Diagnosis present

## 2018-10-13 DIAGNOSIS — J181 Lobar pneumonia, unspecified organism: Secondary | ICD-10-CM | POA: Diagnosis not present

## 2018-10-13 DIAGNOSIS — Z8249 Family history of ischemic heart disease and other diseases of the circulatory system: Secondary | ICD-10-CM | POA: Diagnosis not present

## 2018-10-13 DIAGNOSIS — Z87891 Personal history of nicotine dependence: Secondary | ICD-10-CM | POA: Diagnosis not present

## 2018-10-13 HISTORY — DX: Hypokalemia: E87.6

## 2018-10-13 LAB — GLUCOSE, CAPILLARY
Glucose-Capillary: 240 mg/dL — ABNORMAL HIGH (ref 70–99)
Glucose-Capillary: 183 mg/dL — ABNORMAL HIGH (ref 70–99)
Glucose-Capillary: 160 mg/dL — ABNORMAL HIGH (ref 70–99)
Glucose-Capillary: 144 mg/dL — ABNORMAL HIGH (ref 70–99)

## 2018-10-13 LAB — LACTIC ACID, PLASMA: Lactic Acid, Venous: 1.3 mmol/L (ref 0.5–1.9)

## 2018-10-13 LAB — FIBRINOGEN: Fibrinogen: 679 mg/dL — ABNORMAL HIGH (ref 210–475)

## 2018-10-13 LAB — ABO/RH: ABO/RH(D): O POS

## 2018-10-13 MED ORDER — HYDROXYCHLOROQUINE SULFATE 200 MG PO TABS
400.0000 mg | ORAL_TABLET | Freq: Two times a day (BID) | ORAL | Status: AC
  Administered 2018-10-13 (×2): 400 mg via ORAL
  Filled 2018-10-13 (×2): qty 2

## 2018-10-13 MED ORDER — METOCLOPRAMIDE HCL 5 MG/ML IJ SOLN
5.0000 mg | Freq: Four times a day (QID) | INTRAMUSCULAR | Status: DC | PRN
  Filled 2018-10-13: qty 1

## 2018-10-13 MED ORDER — INSULIN ASPART 100 UNIT/ML ~~LOC~~ SOLN
0.0000 [IU] | Freq: Three times a day (TID) | SUBCUTANEOUS | Status: DC
  Administered 2018-10-13: 08:00:00 3 [IU] via SUBCUTANEOUS
  Administered 2018-10-13 (×2): 2 [IU] via SUBCUTANEOUS
  Administered 2018-10-14: 1 [IU] via SUBCUTANEOUS
  Administered 2018-10-14 (×2): 2 [IU] via SUBCUTANEOUS
  Administered 2018-10-15 (×2): 3 [IU] via SUBCUTANEOUS
  Administered 2018-10-15: 12:00:00 5 [IU] via SUBCUTANEOUS
  Administered 2018-10-16 (×2): 2 [IU] via SUBCUTANEOUS
  Administered 2018-10-16: 1 [IU] via SUBCUTANEOUS
  Administered 2018-10-17 (×2): 2 [IU] via SUBCUTANEOUS
  Administered 2018-10-18: 3 [IU] via SUBCUTANEOUS

## 2018-10-13 MED ORDER — POTASSIUM CHLORIDE CRYS ER 20 MEQ PO TBCR
40.0000 meq | EXTENDED_RELEASE_TABLET | Freq: Once | ORAL | Status: AC
  Administered 2018-10-13: 40 meq via ORAL
  Filled 2018-10-13: qty 2

## 2018-10-13 MED ORDER — AZITHROMYCIN 250 MG PO TABS: 250.0000 mg | ORAL_TABLET | Freq: Every day | ORAL | Status: DC

## 2018-10-13 MED ORDER — ENOXAPARIN SODIUM 40 MG/0.4ML ~~LOC~~ SOLN
40.0000 mg | SUBCUTANEOUS | Status: DC
  Administered 2018-10-13: 40 mg via SUBCUTANEOUS
  Filled 2018-10-13: qty 0.4

## 2018-10-13 MED ORDER — AZITHROMYCIN 250 MG PO TABS
250.0000 mg | ORAL_TABLET | Freq: Every day | ORAL | Status: AC
  Administered 2018-10-14 – 2018-10-18 (×5): 250 mg via ORAL
  Filled 2018-10-13 (×5): qty 1

## 2018-10-13 MED ORDER — ACETAMINOPHEN 325 MG PO TABS
650.0000 mg | ORAL_TABLET | Freq: Four times a day (QID) | ORAL | Status: DC | PRN
  Administered 2018-10-13 – 2018-10-15 (×7): 650 mg via ORAL
  Filled 2018-10-13 (×8): qty 2

## 2018-10-13 MED ORDER — OXYCODONE HCL 5 MG PO TABS: 5.0000 mg | ORAL_TABLET | Freq: Four times a day (QID) | ORAL | Status: DC | PRN

## 2018-10-13 MED ORDER — HYDROXYCHLOROQUINE SULFATE 200 MG PO TABS: 200.0000 mg | ORAL_TABLET | Freq: Two times a day (BID) | ORAL | Status: DC

## 2018-10-13 MED ORDER — HYDROXYCHLOROQUINE SULFATE 200 MG PO TABS
200.0000 mg | ORAL_TABLET | Freq: Two times a day (BID) | ORAL | Status: AC
  Administered 2018-10-14 – 2018-10-17 (×7): 200 mg via ORAL
  Filled 2018-10-13 (×7): qty 1

## 2018-10-13 MED ORDER — ENOXAPARIN SODIUM 40 MG/0.4ML ~~LOC~~ SOLN
40.0000 mg | SUBCUTANEOUS | Status: DC
  Administered 2018-10-14 – 2018-10-18 (×5): 40 mg via SUBCUTANEOUS
  Filled 2018-10-13 (×5): qty 0.4

## 2018-10-13 MED ORDER — GUAIFENESIN-DM 100-10 MG/5ML PO SYRP
10.0000 mL | ORAL_SOLUTION | ORAL | Status: DC | PRN
  Administered 2018-10-13 – 2018-10-16 (×14): 10 mL via ORAL
  Filled 2018-10-13 (×14): qty 10

## 2018-10-13 MED ORDER — SODIUM CHLORIDE 0.9 % IV SOLN
1000.0000 mL | INTRAVENOUS | Status: DC
  Administered 2018-10-13 – 2018-10-14 (×4): 1000 mL via INTRAVENOUS

## 2018-10-13 NOTE — H&P (Addendum)
History and Physical  Shannon [name]:[id number] DOB: 23-Jan-1956 DOA: 10/12/2018 1941  Referring physician: Stanton Kidney [facility]) PCP:  Arnette Felts FNP;  Dorothyann Peng, MD  Outpatient Specialists: n/a  HISTORY   Chief Complaint: fever and malaise x 1 week.   HPI: Shannon [name] is a 63 y.o. female with hx of HTN, DMT2 on tresiba, HLD who presented after fever and malaise x 1 week. 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 Initially had sore throat and mild sinus drainage. Patient reports that she called out from work on Tuesday and Wednesday and then when her symptoms improved, returned to work on Friday. However over the past 3 days, had progressive myalgias, malaise, subjective fevers, and loss of appetite. Reported new non productive dry cough x 3 days. Denies significant dyspnea. Overall feels very weak. Patient reports that one of her co-workers at the facility had recently completed 14-day home quarantine and returned to work this week; and she also reports that a facility resident had positive COVID earlier this month and was hospitalized at [facility]. Patient denies history of chronic respiratory illness or issues. Non-smoker.   Review of Systems:  + malaise, subjective fevers, generalized weakness + dry cough with associated chest tightness + mild dyspnea  - no edema, PND, orthopnea - no nausea/vomiting; no tarry, melanotic or bloody stools - no dysuria, increased urinary frequency - no weight changes Rest of systems reviewed are negative, except as per above history.   ED course:  Vitals Blood pressure (!) 153/72, pulse 93, temperature (!) 103 F (39.4 C), temperature source Oral, resp. rate (!) 22, height  (1.6 m), weight 70.3 kg, SpO2 99 %. Received tylenol  x 1; azithromycin IV  x 1; NS continuous at 125 cc/hr   Past Medical History:  Diagnosis Date   Diabetes  mellitus without complication (HCC)    Hypercholesteremia    Hypertension    Past Surgical History:  Procedure Laterality Date   ABDOMINAL HYSTERECTOMY     CESAREAN SECTION     MYOMECTOMY      Social History:  reports that she has quit smoking. She has never used smokeless tobacco. She reports current alcohol use. She reports that she does not use drugs.  No Known Allergies  Family History  Problem Relation Age of Onset   Hypertension Mother    Diabetes Mother    Diabetes Father    Hypertension Father    Hypertension Son    Diabetes Son       Prior to Admission medications   Medication Sig Start Date End Date Taking? Authorizing Provider  acetaminophen (TYLENOL) 500 MG tablet Take 500-1,000 mg by mouth every 6 (six) hours as needed for headache (or pain).    [provider]  atorvastatin (LIPITOR) 20 MG tablet Take 20 mg by mouth daily.    [provider]  Cholecalciferol (VITAMIN D-3 PO) Take 1 capsule by mouth daily.    [provider]  lisinopril (PRINIVIL,ZESTRIL) 10 MG tablet Take 10 mg by mouth daily.    [provider]  metFORMIN (GLUCOPHAGE) 500 MG tablet metformin 500 mg tablet  TAKE ONE TABLET BY MOUTH EVERY DAY WITH MORNING AND EVENING MEALS.    [provider]  methocarbamol (ROBAXIN) 500 MG tablet Take 1 tablet (500 mg total) by mouth every 8 (eight) hours as needed for muscle spasms. Patient not  taking: Reported on 08/29/2018 06/26/18   Arnette Felts, FNP  polyethylene glycol [facility] / Ethelene Hal) packet Take 17 g by mouth daily. 06/22/18   Rolly Salter, MD  Semaglutide,0.25 or 0.5MG /DOS, (OZEMPIC, 0.25 OR 0.5 MG/DOSE,) 2 MG/1.5ML SOPN Inject 0.5 mg into the skin every Monday. Into the thigh, abdomen or upper arm on the same day each week rotating injection sites    [provider]  Semaglutide,0.25 or 0.5MG /DOS, (OZEMPIC, 0.25 OR 0.5 MG/DOSE,) 2 MG/1.5ML SOPN Inject 0.25 mg into the skin once a week.  08/29/18   Arnette Felts, FNP  TRESIBA FLEXTOUCH 100 UNIT/ML SOPN FlexTouch Pen INJECT 21 UNITS SUBCUTANEOUSLY AS PER INSULIN PROTOCOL Patient taking differently: Inject 21 Units into the skin daily after breakfast.  04/10/18   Arnette Felts, FNP    PHYSICAL EXAM   Temp:  [100.3 F (37.9 C)-103 F (39.4 C)] 103 F (39.4 C) (04/19 2157) Pulse Rate:  [93-100] 93 (04/19 2157) Resp:  [18-22] 22 (04/19 2157) BP: (153-160)/(72-83) 153/72 (04/19 2157) SpO2:  [97 %-99 %] 99 % (04/19 2157) Weight:  [70.3 kg] 70.3 kg (04/19 2200)  BP (!) 153/72 (BP Location: Left Arm)    Pulse 93    Temp (!) 103 F (39.4 C) (Oral)    Resp (!) 22    Ht 5\' 3"  (1.6 m)    Wt 70.3 kg    SpO2 99%    BMI 27.46 kg/m    GEN well-nourished middle-aged african-american female; appears ill, breathing unlabored  HEENT NCAT EOM intact PERRL; clear oropharynx, no cervical LAD; dry mucus membranes  JVP estimated 4 cm H2O above RA; no HJR ; no carotid bruits b/l ;  CV regular normal rate; normal S1 and S2; no m/r/g RESP CTA b/l; breathing unlabored and symmetric  ABD soft NT ND +normoactive BS  EXT warm throughout b/l; no peripheral edema b/l  PULSES  DP and radials 2+ intact b/l  SKIN/MSK no rashes or lesions  NEURO/PSYCH AAOx4; no focal deficits   DATA   LABS ON ADMISSION:  Basic Metabolic Panel: Recent Labs  Lab 10/12/18 2105  NA 130*  K 3.3*  CL 95*  CO2 22  GLUCOSE 260*  BUN 10  CREATININE 0.66  CALCIUM 8.8*   CBC: Recent Labs  Lab 10/12/18 2105  WBC 5.9  NEUTROABS 4.2  HGB 12.1  HCT 36.4  MCV 85.0  PLT 128*   Liver Function Tests: Recent Labs  Lab 10/12/18 2105  AST 26  ALT 25  ALKPHOS 85  BILITOT 0.5  PROT 7.6  ALBUMIN 3.9   No results for input(s): LIPASE, AMYLASE in the last 168 hours. No results for input(s): AMMONIA in the last 168 hours. Coagulation:  Lab Results  Component Value Date   INR 1.0 09/19/2008   No results found for: PTT Lactic Acid, Venous:     Component  Value Date/Time   LATICACIDVEN 2.2 (HH) 10/12/2018 2105   Cardiac Enzymes: Recent Labs  Lab 10/12/18 2105  TROPONINI <0.03   Urinalysis:    Component Value Date/Time   COLORURINE YELLOW 06/17/2018 1305   APPEARANCEUR CLEAR 06/17/2018 1305   LABSPEC 1.003 (L) 06/17/2018 1305   PHURINE 7.0 06/17/2018 1305   GLUCOSEU NEGATIVE 06/17/2018 1305   HGBUR NEGATIVE 06/17/2018 1305   BILIRUBINUR negative 06/26/2018 1529   KETONESUR NEGATIVE 06/17/2018 1305   PROTEINUR Positive (A) 06/26/2018 1529   PROTEINUR NEGATIVE 06/17/2018 1305   UROBILINOGEN 0.2 06/26/2018 1529   UROBILINOGEN 1.0 06/17/2018 1151  NITRITE negative 06/26/2018 1529   NITRITE NEGATIVE 06/17/2018 1305   LEUKOCYTESUR Negative 06/26/2018 1529    BNP (last 3 results) No results for input(s): PROBNP in the last 8760 hours. CBG: No results for input(s): GLUCAP in the last 168 hours.  Radiological Exams on Admission: Dg Chest Portable 1 View  Result Date: 10/12/2018 CLINICAL DATA:  Cough. EXAM: PORTABLE CHEST 1 VIEW COMPARISON:  None. FINDINGS: Heart is normal size. Left lung is clear. Peripheral opacities noted in the right upper lobe. No effusions or acute bony abnormality. IMPRESSION: Peripheral airspace opacities in the right upper lobe concerning for pneumonia. Atypical/viral infection possible. Electronically Signed   By: Charlett NoseKevin  Dover M.D.   On: 10/12/2018 20:29    EKG: Independently reviewed. NSR with QTc 436 ms  I have reviewed the patient's previous electronic chart records, labs, and other data.   ASSESSMENT AND PLAN   Assessment: Franchot ErichsenSylvia A Leatherwood is a 63 y.o. female with hx of HTN, DMT2 on tresiba, HLD who presented after fever and malaise x 1 week, found to be COVID-19 positive with R upper lobe infiltrate on CXR. Currently satting well on RA; BP stable; febrile to 103 deg. Will treat as low-mod risk patient with plaquenil and supportive tx, although with age and risk factors, low threshold for tocilizumab  load if oxygenation declines. Lactate slightly up at 2.2  Active Problems:   COVID-19 virus infection   Plan:   # COVID-19 positive associated viral pneumonia with R upper lung infiltrate  > O2 sats 97-99% on RA; low risk patient on admission; CRP 5.3; fibrinogen 693 - start plaquenil with 400mg  q12h x 2 doses then 200mg  BID - droplet and contact precautions for COVID - daily EKG to monitor QT - telemetry - daily CRP, fibrinogen and CMP, CBC - low threshold for actemra if resp status declines, especially with rising inflammatory markers - tylenol prn fever - reglan prn nausea - azithromycin (D1 4/20) at 250mg  daily x 5 day course - continue IV fluids at 100 cc/hr; small bolus as needed for BP - DVT prophylaxis as below   # Hypokalemia K 3.3  - replete with PO K 40 mEq overnight  - monitor daily lytes  # DMT2 on tresiba (A1c 11.8 in March) - fingersticks ACHS - low dose SSI AC - carb controlled diet  # HTN on lisinopril at home - no lisinopril at this time in setting of infection and COVID  # Hx of HLD - hold home atorvastatin      DVT Prophylaxis: lovenox  Code Status:  Full Code Family Communication: discussed plan with patient Disposition Plan: admit to telemetry floor for COVID pneumonia; dispo pending clinical improvement   Patient contact: Extended Emergency Contact Information Primary Emergency Contact: Jimmie MollyHunt III, [place]  [place] Home Phone: [phone] Relation: Son Secondary Emergency Contact: Wandalee FerdinandDennis, [place] Mobile Phone: [phone] Relation: Sister  Time spent: > 35 mins  Ike Benearolyn J Stefany Starace, MD [facility] Pager [phone]  If 7PM-7AM, please contact night-coverage [email or web address] Password [email or web address] 10/13/2018, 12:22 AM

## 2018-10-13 NOTE — Progress Notes (Signed)
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  She is on lisinopril.  Presented to the emergency room with fever and malaise of 1 week.  In the emergency room temperature 103.  Slightly increased respiratory rate, dry cough with associated chest tightness and mild dyspnea.  COVID-19 positive.  Lactate slightly elevated at 2.2.   Assessment & Plan:   Active Problems:   COVID-19 virus infection  # COVID-19 viral pneumonia with R upper lung infiltrate  Patient is oxygenating fairly well. start plaquenil with 400mg  q12h x 2 doses then 200mg  BID droplet and contact precautions for COVID 19 QTc 450. Telemetry.  daily CRP, fibrinogen and CMP, CBC Symptomatic treatment with nausea medications, Tylenol and cough medications. Azithromycin for 5 days.   # Hypokalemia K 3.3 replete with PO K 40 mEq overnight monitor daily lytes  # DMT2 on tresiba (A1c 11.8 in March) fingersticks ACHS low dose SSI AC carb controlled diet  # HTN on lisinopril at home no lisinopril at this time in setting of infection and COVID  # Hx of HLD - hold home atorvastatin  DVT prophylaxis: Lovenox. Code Status: Full code Family Communication: Patient herself.  Competent. Disposition Plan: Patient will need continuous hospitalization and monitoring, monitoring for decompensation, monitoring for respiratory symptoms and fever. In order to cohort COVID-19 patient's in one facility, [facility] when bed [facility] available.   Consultants:   None.  Procedures:   None.  Antimicrobials:   Hydroxychloroquine, 10/13/2018  Azithromycin, 10/12/2018   Subjective: Patient was seen and examined at the bedside.  Temperature overnight 102.  Patient had some dry  cough.  Feels weak otherwise not much complaints.  Denies any nausea vomiting or diarrhea. Denies any sputum production.  Objective: Vitals:   10/13/18 0027 10/13/18 0200 10/13/18 0710 10/13/18 0918  BP: 139/67 (!) 145/65 (!) 143/70 140/61  Pulse: 83 91 94 89  Resp: 18 20 20 18   Temp: 98.8 F (37.1 C) 99.3 F (37.4 C) (!) 102 F (38.9 C) 99.5 F (37.5 C)  TempSrc: Oral Oral  Oral  SpO2: 97% 100% 97% 100%  Weight:  70.7 kg    Height:  5\' 3"  (1.6 m)      Intake/Output Summary (Last 24 hours) at 10/13/2018 1025 Last data filed at 10/13/2018 0600 Gross per 24 hour  Intake 1166.65 ml  Output -  Net 1166.65 ml   Filed Weights   10/12/18 2200 10/13/18 0200  Weight: 70.3 kg 70.7 kg    Examination:  General exam: Appears calm and comfortable  Respiratory system: Clear to auscultation. Respiratory effort normal. Cardiovascular system: S1 & S2 heard, RRR. No JVD, murmurs, rubs, gallops or clicks. No pedal edema. Gastrointestinal system: Abdomen is nondistended, soft and nontender. No organomegaly or masses felt. Normal bowel sounds heard. Central nervous system: Alert and oriented. No focal neurological deficits. Extremities: Symmetric 5 x 5 power. Skin: No rashes, lesions or ulcers Psychiatry: Judgement and insight appear normal. Mood & affect appropriate.     Data Reviewed: I have personally reviewed following labs and imaging studies  CBC: Recent Labs  Lab 10/12/18 2105  WBC 5.9  NEUTROABS 4.2  HGB 12.1  HCT 36.4  MCV 85.0  PLT 128*   Basic Metabolic Panel: Recent Labs  Lab 10/12/18  2105  NA 130*  K 3.3*  CL 95*  CO2 22  GLUCOSE 260*  BUN 10  CREATININE 0.66  CALCIUM 8.8*   GFR: Estimated Creatinine Clearance: 67 mL/min (by C-G formula based on SCr of 0.66 mg/dL). Liver Function Tests: Recent Labs  Lab 10/12/18 2105  AST 26  ALT 25  ALKPHOS 85  BILITOT 0.5  PROT 7.6  ALBUMIN 3.9   No results for input(s): LIPASE, AMYLASE in the last 168 hours.  No results for input(s): AMMONIA in the last 168 hours. Coagulation Profile: No results for input(s): INR, PROTIME in the last 168 hours. Cardiac Enzymes: Recent Labs  Lab 10/12/18 2105  TROPONINI <0.03   BNP (last 3 results) No results for input(s): PROBNP in the last 8760 hours. HbA1C: No results for input(s): HGBA1C in the last 72 hours. CBG: Recent Labs  Lab 10/13/18 0757  GLUCAP 240*   Lipid Profile: Recent Labs    10/12/18 2105  TRIG 205*   Thyroid Function Tests: No results for input(s): TSH, T4TOTAL, FREET4, T3FREE, THYROIDAB in the last 72 hours. Anemia Panel: Recent Labs    10/12/18 2105  FERRITIN 528*   Sepsis Labs: Recent Labs  Lab 10/12/18 2105 10/12/18 2305  PROCALCITON <0.10  --   LATICACIDVEN 2.2* 1.3    Recent Results (from the past 240 hour(s))  SARS Coronavirus 2 [facility] order, Performed in [facility] hospital lab)     Status: Abnormal   Collection Time: 10/12/18  9:05 PM  Result Value Ref Range Status   SARS Coronavirus 2 POSITIVE (A) NEGATIVE Final    Comment: RESULT CALLED TO, READ BACK BY AND VERIFIED WITH: Z,WRIGHT AT 2359 ON 10/12/18 BY A,MOHAMED (NOTE) If result is NEGATIVE SARS-CoV-2 target nucleic acids are NOT DETECTED. The SARS-CoV-2 RNA is generally detectable in upper and lower  respiratory specimens during the acute phase of infection. The lowest  concentration of SARS-CoV-2 viral copies this assay can detect is 250  copies / mL. A negative result does not preclude SARS-CoV-2 infection  and should not be used as the sole basis for treatment or other  patient management decisions.  A negative result may occur with  improper specimen collection / handling, submission of specimen other  than nasopharyngeal swab, presence of viral mutation(s) within the  areas targeted by this assay, and inadequate number of viral copies  (<250 copies / mL). A negative result must be combined with clinical  observations, patient history,  and epidemiological information. If result is POSITIVE SARS-CoV-2 target nucleic acids are DETECTE D. The SARS-CoV-2 RNA is generally detectable in upper and lower  respiratory specimens during the acute phase of infection.  Positive  results are indicative of active infection with SARS-CoV-2.  Clinical  correlation with patient history and other diagnostic information is  necessary to determine patient infection status.  Positive results do  not rule out bacterial infection or co-infection with other viruses. If result is PRESUMPTIVE POSTIVE SARS-CoV-2 nucleic acids MAY BE PRESENT.   A presumptive positive result was obtained on the submitted specimen  and confirmed on repeat testing.  While 2019 novel coronavirus  (SARS-CoV-2) nucleic acids may be present in the submitted sample  additional confirmatory testing may be necessary for epidemiological  and / or clinical management purposes  to differentiate between  SARS-CoV-2 and other Sarbecovirus currently known to infect humans.  If clinically indicated additional testing with an alternate test  methodology (LAB745 3) is advised. The SARS-CoV-2 RNA is generally  detectable  in upper and lower respiratory specimens during the acute  phase of infection. The expected result is Negative. Fact Sheet for Patients:  BoilerBrush.com.cy Fact Sheet for Healthcare Providers: https://pope.com/ This test is not yet approved or cleared by the [place] FDA and has been authorized for detection and/or diagnosis of SARS-CoV-2 by FDA under an Emergency Use Authorization (EUA).  This EUA will remain in effect (meaning this test can be used) for the duration of the COVID-19 declaration under Section 564(b)(1) of the Act, 21 U.S.C. section 360bbb-3(b)(1), unless the authorization is terminated or revoked sooner. Performed at [facility], [address] [place], [place] [address]           Radiology Studies: Dg Chest Portable 1 View  Result Date: 10/12/2018 CLINICAL DATA:  Cough. EXAM: PORTABLE CHEST 1 VIEW COMPARISON:  None. FINDINGS: Heart is normal size. Left lung is clear. Peripheral opacities noted in the right upper lobe. No effusions or acute bony abnormality. IMPRESSION: Peripheral airspace opacities in the right upper lobe concerning for pneumonia. Atypical/viral infection possible. Electronically Signed   By: Charlett Nose M.D.   On: 10/12/2018 20:29        Scheduled Meds: . [START ON 10/14/2018] azithromycin  250 mg Oral Daily  . enoxaparin (LOVENOX) injection  40 mg Subcutaneous Q24H  . hydroxychloroquine  200 mg Oral BID  . insulin aspart  0-9 Units Subcutaneous TID WC   Continuous Infusions: . sodium chloride 1,000 mL (10/13/18 0248)     LOS: 0 days    Time spent: 25 minutes     Dorcas Carrow, MD [facility] Pager [phone]  If 7PM-7AM, please contact night-coverage [email or web address] Password [facility] 10/13/2018, 10:25 AM

## 2018-10-13 NOTE — ED Notes (Signed)
ED TO INPATIENT HANDOFF REPORT  ED Nurse Name and Phone #: Charissa Bash, RN  S Name/Age/Gender Shannon [name] 63 y.o. female Room/Bed: WA18/WA18  Code Status   Code Status: Full Code  Home/SNF/Other Home Patient oriented to: self, place, time and situation Is this baseline? Yes   Triage Complete: Triage complete  Chief Complaint Flu Like Symptoms   Triage Note Pt reports URI symptoms that started on Tuesday, got better and then worsened on Friday. She is now complaining of a cough, loss of smell and taste, headache, and weakness. Cough is not productive and she states that her lungs feel clear. Denies fever at home, but low grade fever noted in triage.    Allergies No Known Allergies  Level of Care/Admitting Diagnosis ED Disposition    ED Disposition Condition Comment   Admit  [facility]ospital Area[facility] [[id number]]  Level of Care: Telemetry [5]  Admit to tele based on following criteria: Monitor QTC interval  Covid Evaluation: Confirmed COVID Positive  Isolation Risk Level: Low Risk  (Less than 4L [place] supplementation)  Diagnosis: COVID-19 virus infection [[id number]]  Admitting Physician: Ike Bene [[id number]]  Attending Physician: Ike Bene [[id number]]  Estimated length of stay: past midnight tomorrow  Certification:: I certify this patient will need inpatient services for at least 2 midnights  PT Class (Do Not Modify): Inpatient [101]  PT Acc Code (Do Not Modify): Private [1]       B Medical/Surgery History Past Medical History:  Diagnosis Date  . Diabetes mellitus without complication (HCC)   . Hypercholesteremia   . Hypertension    Past Surgical History:  Procedure Laterality Date  . ABDOMINAL HYSTERECTOMY    . CESAREAN SECTION    . MYOMECTOMY       A IV Location/Drains/Wounds Patient Lines/Drains/Airways Status   Active Line/Drains/Airways    Name:   Placement date:   Placement time:   Site:   Days:   Peripheral IV 10/12/18 Left  Antecubital   10/12/18    2210    Antecubital   1          Intake/Output Last 24 hours No intake or output data in the 24 hours ending 10/13/18 0117  Labs/Imaging Results for orders placed or performed during the hospital encounter of 10/12/18 (from the past 48 hour(s))  SARS Coronavirus 2 [facility] order, Performed in [facility] hospital lab)     Status: Abnormal   Collection Time: 10/12/18  9:05 PM  Result Value Ref Range   SARS Coronavirus 2 POSITIVE (A) NEGATIVE    Comment: RESULT CALLED TO, READ BACK BY AND VERIFIED WITH: Z,Kanyon Bunn AT 2359 ON 10/12/18 BY A,MOHAMED (NOTE) If result is NEGATIVE SARS-CoV-2 target nucleic acids are NOT DETECTED. The SARS-CoV-2 RNA is generally detectable in upper and lower  respiratory specimens during the acute phase of infection. The lowest  concentration of SARS-CoV-2 viral copies this assay can detect is 250  copies / mL. A negative result does not preclude SARS-CoV-2 infection  and should not be used as the sole basis for treatment or other  patient management decisions.  A negative result may occur with  improper specimen collection / handling, submission of specimen other  than nasopharyngeal swab, presence of viral mutation(s) within the  areas targeted by this assay, and inadequate number of viral copies  (<250 copies / mL). A negative result must be combined with clinical  observations, patient history, and epidemiological information. If result is POSITIVE SARS-CoV-2 target nucleic  acids are DETECTE D. The SARS-CoV-2 RNA is generally detectable in upper and lower  respiratory specimens during the acute phase of infection.  Positive  results are indicative of active infection with SARS-CoV-2.  Clinical  correlation with patient history and other diagnostic information is  necessary to determine patient infection status.  Positive results do  not rule out bacterial infection or co-infection with other viruses. If result is PRESUMPTIVE  POSTIVE SARS-CoV-2 nucleic acids MAY BE PRESENT.   A presumptive positive result was obtained on the submitted specimen  and confirmed on repeat testing.  While 2019 novel coronavirus  (SARS-CoV-2) nucleic acids may be present in the submitted sample  additional confirmatory testing may be necessary for epidemiological  and / or clinical management purposes  to differentiate between  SARS-CoV-2 and other Sarbecovirus currently known to infect humans.  If clinically indicated additional testing with an alternate test  methodology (LAB745 3) is advised. The SARS-CoV-2 RNA is generally  detectable in upper and lower respiratory specimens during the acute  phase of infection. The expected result is Negative. Fact Sheet for Patients:  BoilerBrush.com.cyhttps://www.fda.gov/media/136312/download Fact Sheet for Healthcare Providers: https://pope.com/https://www.fda.gov/media/136313/download This test is not yet approved or cleared by the [place] FDA and has been authorized for detection and/or diagnosis of SARS-CoV-2 by FDA under an Emergency Use Authorization (EUA).  This EUA will remain in effect (meaning this test can be used) for the duration of the COVID-19 declaration under Section 564(b)(1) of the Act, 21 U.S.C. section 360bbb-3(b)(1), unless the authorization is terminated or revoked sooner. Performed at [facility], [address] [place], [place] [address]   Lactic acid, plasma     Status: Abnormal   Collection Time: 10/12/18  9:05 PM  Result Value Ref Range   Lactic Acid, Venous 2.2 (HH) 0.5 - 1.9 mmol/L    Comment: CRITICAL RESULT CALLED TO, READ BACK BY AND VERIFIED WITH: E,Freemon Binford AT 2305 ON 10/12/18 BY A,MOHAMED Performed at [facility], [address] [place], [place] [address]   CBC WITH DIFFERENTIAL     Status: Abnormal   Collection Time: 10/12/18  9:05 PM  Result Value Ref Range   WBC 5.9 4.0 - 10.5 K/uL   RBC 4.28 3.87 - 5.11 MIL/uL   Hemoglobin 12.1  12.0 - 15.0 g/dL   HCT 56.236.4 13.036.0 - 86.546.0 %   MCV 85.0 80.0 - 100.0 fL   MCH 28.3 26.0 - 34.0 pg   MCHC 33.2 30.0 - 36.0 g/dL   RDW 78.412.5 69.611.5 - 29.515.5 %   Platelets 128 (L) 150 - 400 K/uL   nRBC 0.0 0.0 - 0.2 %   Neutrophils Relative % 70 %   Neutro Abs 4.2 1.7 - 7.7 K/uL   Lymphocytes Relative 21 %   Lymphs Abs 1.3 0.7 - 4.0 K/uL   Monocytes Relative 6 %   Monocytes Absolute 0.4 0.1 - 1.0 K/uL   Eosinophils Relative 2 %   Eosinophils Absolute 0.1 0.0 - 0.5 K/uL   Basophils Relative 1 %   Basophils Absolute 0.0 0.0 - 0.1 K/uL   Immature Granulocytes 0 %   Abs Immature Granulocytes 0.01 0.00 - 0.07 K/uL    Comment: Performed at [facility], [address] [place], [place] [address]  Comprehensive metabolic panel     Status: Abnormal   Collection Time: 10/12/18  9:05 PM  Result Value Ref Range   Sodium 130 (L) 135 - 145 mmol/L   Potassium 3.3 (L)  3.5 - 5.1 mmol/L   Chloride 95 (L) 98 - 111 mmol/L   CO2 22 22 - 32 mmol/L   Glucose, Bld 260 (H) 70 - 99 mg/dL   BUN 10 8 - 23 mg/dL   Creatinine, Ser 1.61 0.44 - 1.00 mg/dL   Calcium 8.8 (L) 8.9 - 10.3 mg/dL   Total Protein 7.6 6.5 - 8.1 g/dL   Albumin 3.9 3.5 - 5.0 g/dL   AST 26 15 - 41 U/L   ALT 25 0 - 44 U/L   Alkaline Phosphatase 85 38 - 126 U/L   Total Bilirubin 0.5 0.3 - 1.2 mg/dL   GFR calc non Af Amer >60 >60 mL/min   GFR calc Af Amer >60 >60 mL/min   Anion gap 13 5 - 15    Comment: Performed at [facility], [address], [place], [place] [address]  D-dimer, quantitative     Status: None   Collection Time: 10/12/18  9:05 PM  Result Value Ref Range   D-Dimer, Quant 0.41 0.00 - 0.50 ug/mL-FEU    Comment: (NOTE) At the manufacturer cut-off of 0.50 ug/mL FEU, this assay has been documented to exclude PE with a sensitivity and negative predictive value of 97 to 99%.  At this time, this assay has not been approved by the FDA to exclude DVT/VTE. Results should be correlated with  clinical presentation. Performed at [facility], [address] [place], [place] [address]   Procalcitonin     Status: None   Collection Time: 10/12/18  9:05 PM  Result Value Ref Range   Procalcitonin <0.10 ng/mL    Comment:        Interpretation: PCT (Procalcitonin) <= 0.5 ng/mL: Systemic infection (sepsis) is not likely. Local bacterial infection is possible. (NOTE)       Sepsis PCT Algorithm           Lower Respiratory Tract                                      Infection PCT Algorithm    ----------------------------     ----------------------------         PCT < 0.25 ng/mL                PCT < 0.10 ng/mL         Strongly encourage             Strongly discourage   discontinuation of antibiotics    initiation of antibiotics    ----------------------------     -----------------------------       PCT 0.25 - 0.50 ng/mL            PCT 0.10 - 0.25 ng/mL               OR       >80% decrease in PCT            Discourage initiation of                                            antibiotics      Encourage discontinuation           of antibiotics    ----------------------------     -----------------------------  PCT >= 0.50 ng/mL              PCT 0.26 - 0.50 ng/mL               AND        <80% decrease in PCT             Encourage initiation of                                             antibiotics       Encourage continuation           of antibiotics    ----------------------------     -----------------------------        PCT >= 0.50 ng/mL                  PCT > 0.50 ng/mL               AND         increase in PCT                  Strongly encourage                                      initiation of antibiotics    Strongly encourage escalation           of antibiotics                                     -----------------------------                                           PCT <= 0.25 ng/mL                                                 OR                                         > 80% decrease in PCT                                     Discontinue / Do not initiate                                             antibiotics Performed at [facility], [address] [place], [place] [address]   Lactate dehydrogenase     Status: None   Collection Time: 10/12/18  9:05 PM  Result Value Ref Range   LDH 173 98 - 192 U/L    Comment: Performed at [facility], [address], [place], [place] [address]  Ferritin  Status: Abnormal   Collection Time: 10/12/18  9:05 PM  Result Value Ref Range   Ferritin 528 (H) 11 - 307 ng/mL    Comment: Performed at [facility], [address], [place], [place] [address]  Triglycerides     Status: Abnormal   Collection Time: 10/12/18  9:05 PM  Result Value Ref Range   Triglycerides 205 (H) <150 mg/dL    Comment: Performed at [facility], [address] [place], [place] [address]  Fibrinogen     Status: Abnormal   Collection Time: 10/12/18  9:05 PM  Result Value Ref Range   Fibrinogen 693 (H) 210 - 475 mg/dL    Comment: Performed at [facility], [address] [place], [place] [address]  C-reactive protein     Status: Abnormal   Collection Time: 10/12/18  9:05 PM  Result Value Ref Range   CRP 5.3 (H) <1.0 mg/dL    Comment: Performed at [facility], [address], [place], [place] [address]  Troponin I - Once     Status: None   Collection Time: 10/12/18  9:05 PM  Result Value Ref Range   Troponin I <0.03 <0.03 ng/mL    Comment: Performed at [facility], [address] [place], [place] [address]  Brain natriuretic peptide     Status: None   Collection Time: 10/12/18  9:05 PM  Result Value Ref Range   B Natriuretic Peptide 19.7 0.0 - 100.0 pg/mL    Comment: Performed at [facility], [address], [place], [place] [address]  Lactic acid,  plasma     Status: None   Collection Time: 10/12/18 11:05 PM  Result Value Ref Range   Lactic Acid, Venous 1.3 0.5 - 1.9 mmol/L    Comment: Performed at [facility], [address], [place], [place] [address]   Dg Chest Portable 1 View  Result Date: 10/12/2018 CLINICAL DATA:  Cough. EXAM: PORTABLE CHEST 1 VIEW COMPARISON:  None. FINDINGS: Heart is normal size. Left lung is clear. Peripheral opacities noted in the right upper lobe. No effusions or acute bony abnormality. IMPRESSION: Peripheral airspace opacities in the right upper lobe concerning for pneumonia. Atypical/viral infection possible. Electronically Signed   By: Charlett Nose M.D.   On: 10/12/2018 20:29    Pending Labs Unresulted Labs (From admission, onward)    Start     Ordered   10/14/18 0500  CBC with Differential/Platelet  Daily,   R     10/13/18 0017   10/14/18 0500  Comprehensive metabolic panel  Daily,   R     10/13/18 0017   10/14/18 0500  C-reactive protein  Daily,   R     10/13/18 0017   10/13/18 0029  Fibrinogen  Daily,   R     10/13/18 0029   10/13/18 0011  ABO/Rh  Once,   R     10/13/18 0017   10/12/18 2105  Blood Culture (routine x 2)  BLOOD CULTURE X 2,   STAT    Question:  Patient immune status  Answer:  Normal   10/12/18 2105          Vitals/Pain Today's Vitals   10/12/18 2200 10/12/18 2200 10/13/18 0027 10/13/18 0036  BP:   139/67   Pulse:   83   Resp:   18   Temp:   98.8 F (37.1 C)   TempSrc:   Oral  SpO2:   97%   Weight:  70.3 kg    Height:   (1.6 m)    PainSc: 4    3     Isolation Precautions Droplet and Contact precautions  Medications Medications  acetaminophen (TYLENOL) 500 MG tablet (has no administration in time range)  azithromycin (ZITHROMAX) 500 mg in sodium chloride 0.9 % 250 mL IVPB (500 mg Intravenous New Bag/Given 10/13/18 0035)  0.9 %  sodium chloride infusion (has no administration in time range)  enoxaparin (LOVENOX) injection 40 mg (has no  administration in time range)  hydroxychloroquine (PLAQUENIL) tablet 400 mg (has no administration in time range)    Followed by  hydroxychloroquine (PLAQUENIL) tablet 200 mg (has no administration in time range)  potassium chloride SA (K-DUR) CR tablet 40 mEq (has no administration in time range)  acetaminophen (TYLENOL) tablet 650 mg (has no administration in time range)  insulin aspart (novoLOG) injection 0-9 Units (has no administration in time range)  metoCLOPramide (REGLAN) injection 5 mg (has no administration in time range)  oxyCODONE (Oxy IR/ROXICODONE) immediate release tablet 5 mg (has no administration in time range)  azithromycin (ZITHROMAX) tablet 250 mg (has no administration in time range)  acetaminophen (TYLENOL) tablet 1,000 mg (1,000 mg Oral Given 10/12/18 2230)    Mobility walks Low fall risk   Focused Assessments Pulmonary Assessment Handoff:  Lung sounds: Bilateral Breath Sounds: Expiratory wheezes O2 Device: Room Air        R Recommendations: See Admitting Provider Note  Report given to:   Additional Notes:

## 2018-10-14 LAB — CBC WITH DIFFERENTIAL/PLATELET
Abs Immature Granulocytes: 0.02 10*3/uL (ref 0.00–0.07)
Basophils Absolute: 0 10*3/uL (ref 0.0–0.1)
Basophils Relative: 0 %
Eosinophils Absolute: 0 10*3/uL (ref 0.0–0.5)
Eosinophils Relative: 0 %
HCT: 32.6 % — ABNORMAL LOW (ref 36.0–46.0)
Hemoglobin: 10.7 g/dL — ABNORMAL LOW (ref 12.0–15.0)
Immature Granulocytes: 0 %
Lymphocytes Relative: 25 %
Lymphs Abs: 1.3 10*3/uL (ref 0.7–4.0)
MCH: 28.2 pg (ref 26.0–34.0)
MCHC: 32.8 g/dL (ref 30.0–36.0)
MCV: 85.8 fL (ref 80.0–100.0)
Monocytes Absolute: 0.2 10*3/uL (ref 0.1–1.0)
Monocytes Relative: 4 %
Neutro Abs: 3.6 10*3/uL (ref 1.7–7.7)
Neutrophils Relative %: 71 %
Platelets: 113 10*3/uL — ABNORMAL LOW (ref 150–400)
RBC: 3.8 MIL/uL — ABNORMAL LOW (ref 3.87–5.11)
RDW: 12.6 % (ref 11.5–15.5)
WBC: 5.2 10*3/uL (ref 4.0–10.5)
nRBC: 0 % (ref 0.0–0.2)

## 2018-10-14 LAB — COMPREHENSIVE METABOLIC PANEL
ALT: 23 U/L (ref 0–44)
AST: 26 U/L (ref 15–41)
Albumin: 3.1 g/dL — ABNORMAL LOW (ref 3.5–5.0)
Alkaline Phosphatase: 66 U/L (ref 38–126)
Anion gap: 11 (ref 5–15)
BUN: 6 mg/dL — ABNORMAL LOW (ref 8–23)
CO2: 19 mmol/L — ABNORMAL LOW (ref 22–32)
Calcium: 7.8 mg/dL — ABNORMAL LOW (ref 8.9–10.3)
Chloride: 105 mmol/L (ref 98–111)
Creatinine, Ser: 0.46 mg/dL (ref 0.44–1.00)
GFR calc Af Amer: >60 mL/min (ref >60)
GFR calc non Af Amer: >60 mL/min (ref >60)
Glucose, Bld: 186 mg/dL — ABNORMAL HIGH (ref 70–99)
Potassium: 3.2 mmol/L — ABNORMAL LOW (ref 3.5–5.1)
Sodium: 135 mmol/L (ref 135–145)
Total Bilirubin: 0.5 mg/dL (ref 0.3–1.2)
Total Protein: 6.4 g/dL — ABNORMAL LOW (ref 6.5–8.1)

## 2018-10-14 LAB — GLUCOSE, CAPILLARY
Glucose-Capillary: 173 mg/dL — ABNORMAL HIGH (ref 70–99)
Glucose-Capillary: 181 mg/dL — ABNORMAL HIGH (ref 70–99)
Glucose-Capillary: 133 mg/dL — ABNORMAL HIGH (ref 70–99)
Glucose-Capillary: 213 mg/dL — ABNORMAL HIGH (ref 70–99)

## 2018-10-14 LAB — C-REACTIVE PROTEIN: CRP: 6.6 mg/dL — ABNORMAL HIGH (ref <1.0)

## 2018-10-14 LAB — FIBRINOGEN: Fibrinogen: 750 mg/dL — ABNORMAL HIGH (ref 210–475)

## 2018-10-14 MED ORDER — POTASSIUM CHLORIDE CRYS ER 20 MEQ PO TBCR
40.0000 meq | EXTENDED_RELEASE_TABLET | Freq: Two times a day (BID) | ORAL | Status: AC
  Administered 2018-10-14 (×2): 40 meq via ORAL
  Filled 2018-10-14 (×2): qty 2

## 2018-10-14 NOTE — Progress Notes (Signed)
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  She is on lisinopril.  Presented to the emergency room with fever and malaise of 1 week.  In the emergency room temperature 103.  Slightly increased respiratory rate, dry cough with associated chest tightness and mild dyspnea.  COVID-19 positive.  Lactate slightly elevated at 2.2.   Assessment & Plan:   Active Problems:   Type 2 diabetes mellitus without complication, with long-term current use of insulin (HCC)   Essential hypertension   COVID-19 virus infection   Hypokalemia  # COVID-19 viral pneumonia with R upper lung infiltrate , Patient is oxygenating fairly well. on plaquenil with 400mg  q12h x 2 doses then 200mg  BID droplet and contact precautions for COVID 19 QTc 450. Telemetry.  daily CRP, fibrinogen and CMP, CBC Symptomatic treatment with nausea medications, Tylenol and cough medications. Azithromycin for 5 days.   # Hypokalemia K 3.2 Replaced with potassium.   # DMT2 on tresiba (A1c 11.8 in March) fingersticks ACHS low dose SSI AC carb controlled diet  # HTN on lisinopril at home no lisinopril at this time in setting of infection and COVID  # Hx of HLD - hold home atorvastatin  DVT prophylaxis: Lovenox. Code Status: Full code Family Communication: Patient herself.  Competent. Disposition Plan: Patient will need continuous hospitalization and monitoring, monitoring for decompensation, monitoring for respiratory symptoms and fever. In order to cohort COVID-19 patient's in one facility, will transfer patient to [facility] campus when bed is available.   Consultants:   None.  Procedures:   None.  Antimicrobials:   Hydroxychloroquine, 10/13/2018  Azithromycin, 10/12/2018   Subjective:  Patient was seen and examined at the bedside.  Temperature overnight 102.2.  Patient had some dry cough.  Feels weak otherwise not much complaints.  Denies any nausea vomiting or diarrhea. Denies any sputum production.  Objective: Vitals:   10/13/18 1648 10/13/18 2148 10/13/18 2344 10/14/18 0630  BP: (!) 165/74 (!) 153/74  (!) 154/75  Pulse: 90 91  88  Resp: 14 20  (!) 22  Temp: 98.4 F (36.9 C) (!) 102.2 F (39 C) 99.4 F (37.4 C) (!) 102 F (38.9 C)  TempSrc: Oral Oral Oral Oral  SpO2: 100% 98%  100%  Weight:      Height:        Intake/Output Summary (Last 24 hours) at 10/14/2018 0947 Last data filed at 10/14/2018 9562 Gross per 24 hour  Intake 2550.21 ml  Output -  Net 2550.21 ml   Filed Weights   10/12/18 2200 10/13/18 0200  Weight: 70.3 kg 70.7 kg    Examination:  General exam: Appears calm and comfortable  Respiratory system: Clear to auscultation. Respiratory effort normal. Cardiovascular system: S1 & S2 heard, RRR. No JVD, murmurs, rubs, gallops or clicks. No pedal edema. Gastrointestinal system: Abdomen is nondistended, soft and nontender. No organomegaly or masses felt. Normal bowel sounds heard. Central nervous system: Alert and oriented. No focal neurological deficits. Extremities: Symmetric 5 x 5 power. Skin: No rashes, lesions or ulcers Psychiatry: Judgement and insight appear normal. Mood & affect appropriate.     Data Reviewed: I have personally reviewed following labs and imaging studies  CBC: Recent Labs  Lab 10/12/18 2105 10/14/18 0525  WBC 5.9 5.2  NEUTROABS 4.2 3.6  HGB 12.1  10.7*  HCT 36.4 32.6*  MCV 85.0 85.8  PLT 128* 113*   Basic Metabolic Panel: Recent Labs  Lab 10/12/18 2105 10/14/18 0525  NA 130* 135  K 3.3* 3.2*  CL 95* 105  CO2 22 19*  GLUCOSE 260* 186*  BUN 10 6*  CREATININE 0.66 0.46  CALCIUM 8.8* 7.8*   GFR: Estimated Creatinine Clearance: 67 mL/min (by C-G formula based on SCr of 0.46 mg/dL). Liver Function  Tests: Recent Labs  Lab 10/12/18 2105 10/14/18 0525  AST 26 26  ALT 25 23  ALKPHOS 85 66  BILITOT 0.5 0.5  PROT 7.6 6.4*  ALBUMIN 3.9 3.1*   No results for input(s): LIPASE, AMYLASE in the last 168 hours. No results for input(s): AMMONIA in the last 168 hours. Coagulation Profile: No results for input(s): INR, PROTIME in the last 168 hours. Cardiac Enzymes: Recent Labs  Lab 10/12/18 2105  TROPONINI <0.03   BNP (last 3 results) No results for input(s): PROBNP in the last 8760 hours. HbA1C: No results for input(s): HGBA1C in the last 72 hours. CBG: Recent Labs  Lab 10/13/18 0757 10/13/18 1235 10/13/18 1645 10/13/18 2146 10/14/18 0728  GLUCAP 240* 183* 160* 144* 173*   Lipid Profile: Recent Labs    10/12/18 2105  TRIG 205*   Thyroid Function Tests: No results for input(s): TSH, T4TOTAL, FREET4, T3FREE, THYROIDAB in the last 72 hours. Anemia Panel: Recent Labs    10/12/18 2105  FERRITIN 528*   Sepsis Labs: Recent Labs  Lab 10/12/18 2105 10/12/18 2305  PROCALCITON <0.10  --   LATICACIDVEN 2.2* 1.3    Recent Results (from the past 240 hour(s))  SARS Coronavirus 2 [facility] order, Performed in [facility] hospital lab)     Status: Abnormal   Collection Time: 10/12/18  9:05 PM  Result Value Ref Range Status   SARS Coronavirus 2 POSITIVE (A) NEGATIVE Final    Comment: RESULT CALLED TO, READ BACK BY AND VERIFIED WITH: Z,WRIGHT AT 2359 ON 10/12/18 BY A,MOHAMED (NOTE) If result is NEGATIVE SARS-CoV-2 target nucleic acids are NOT DETECTED. The SARS-CoV-2 RNA is generally detectable in upper and lower  respiratory specimens during the acute phase of infection. The lowest  concentration of SARS-CoV-2 viral copies this assay can detect is 250  copies / mL. A negative result does not preclude SARS-CoV-2 infection  and should not be used as the sole basis for treatment or other  patient management decisions.  A negative result may occur with  improper specimen  collection / handling, submission of specimen other  than nasopharyngeal swab, presence of viral mutation(s) within the  areas targeted by this assay, and inadequate number of viral copies  (<250 copies / mL). A negative result must be combined with clinical  observations, patient history, and epidemiological information. If result is POSITIVE SARS-CoV-2 target nucleic acids are DETECTE D. The SARS-CoV-2 RNA is generally detectable in upper and lower  respiratory specimens during the acute phase of infection.  Positive  results are indicative of active infection with SARS-CoV-2.  Clinical  correlation with patient history and other diagnostic information is  necessary to determine patient infection status.  Positive results do  not rule out bacterial infection or co-infection with other viruses. If result is PRESUMPTIVE POSTIVE SARS-CoV-2 nucleic acids MAY BE PRESENT.   A presumptive positive result was obtained on the submitted specimen  and confirmed on repeat testing.  While 2019 novel coronavirus  (SARS-CoV-2) nucleic acids may be present in the submitted sample  additional confirmatory testing may be necessary for epidemiological  and / or clinical management purposes  to differentiate between  SARS-CoV-2 and other Sarbecovirus currently known to infect humans.  If clinically indicated additional testing with an alternate test  methodology (LAB745 3) is advised. The SARS-CoV-2 RNA is generally  detectable in upper and lower respiratory specimens during the acute  phase of infection. The expected result is Negative. Fact Sheet for Patients:  BoilerBrush.com.cyhttps://www.fda.gov/media/136312/download Fact Sheet for Healthcare Providers: https://pope.com/https://www.fda.gov/media/136313/download This test is not yet approved or cleared by the [place] FDA and has been authorized for detection and/or diagnosis of SARS-CoV-2 by FDA under an Emergency Use Authorization (EUA).  This EUA will remain in effect  (meaning this test can be used) for the duration of the COVID-19 declaration under Section 564(b)(1) of the Act, 21 U.S.C. section 360bbb-3(b)(1), unless the authorization is terminated or revoked sooner. Performed at [facility], [address] [place], [place] [address]          Radiology Studies: Dg Chest Portable 1 View  Result Date: 10/12/2018 CLINICAL DATA:  Cough. EXAM: PORTABLE CHEST 1 VIEW COMPARISON:  None. FINDINGS: Heart is normal size. Left lung is clear. Peripheral opacities noted in the right upper lobe. No effusions or acute bony abnormality. IMPRESSION: Peripheral airspace opacities in the right upper lobe concerning for pneumonia. Atypical/viral infection possible. Electronically Signed   By: Charlett NoseKevin  Dover M.D.   On: 10/12/2018 20:29        Scheduled Meds: . azithromycin  250 mg Oral QAC breakfast  . enoxaparin (LOVENOX) injection  40 mg Subcutaneous Q24H  . hydroxychloroquine  200 mg Oral BID  . insulin aspart  0-9 Units Subcutaneous TID WC  . potassium chloride  40 mEq Oral BID   Continuous Infusions:    LOS: 1 day    Time spent: 25 minutes     Dorcas CarrowKuber Dhiya Smits, MD [facility] Pager [phone]  If 7PM-7AM, please contact night-coverage [email or web address] Password [email or web address] 10/14/2018, 9:47 AM

## 2018-10-14 NOTE — TOC Initial Note (Signed)
Transition of Care [facility]) - Initial/Assessment Note    Patient Details  Name: Shannon [name] MRN: [id number] Date of Birth: [birth year]  [facility]) CM/SW Contact:    Geni Bers, RN Phone Number: 10/14/2018, 11:31 AM  Clinical Narrative:                   Expected Discharge Plan: Home/Self Care Barriers to Discharge: No Barriers Identified   Patient Goals and CMS Choice Patient states their goals for this hospitalization and ongoing recovery are:: To get better.      Expected Discharge Plan and Services Expected Discharge Plan: Home/Self Care       Living arrangements for the past 2 months: Single Family Home                          Prior Living Arrangements/Services Living arrangements for the past 2 months: Single Family Home Lives with:: Self Patient language and need for interpreter reviewed:: No Do you feel safe going back to the place where you live?: Yes               Activities of Daily Living Home Assistive Devices/Equipment: None ADL Screening (condition at time of admission) Patient's cognitive ability adequate to safely complete daily activities?: Yes Is the patient deaf or have difficulty hearing?: No Does the patient have difficulty seeing, even when wearing glasses/contacts?: No Does the patient have difficulty concentrating, remembering, or making decisions?: No Patient able to express need for assistance with ADLs?: Yes Does the patient have difficulty dressing or bathing?: No Independently performs ADLs?: Yes (appropriate for developmental age) Does the patient have difficulty walking or climbing stairs?: No Weakness of Legs: None Weakness of Arms/Hands: None  Permission Sought/Granted Permission sought to share information with : Case Manager                Emotional Assessment Appearance:: Appears stated age   Affect (typically observed): Accepting Orientation: : Oriented to Self, Oriented to Place,  Oriented to  Time, Oriented to Situation      Admission diagnosis:  Community acquired pneumonia of right upper lobe of lung (HCC) [J18.1] Suspected Covid-19 Virus Infection [R68.89] COVID-19 virus infection [U07.1] Patient Active Problem List   Diagnosis Date Noted  . COVID-19 virus infection 10/13/2018  . Hypokalemia 10/13/2018  . Pyelonephritis 06/17/2018  . Type 2 diabetes mellitus without complication, with long-term current use of insulin (HCC) 05/30/2018  . Essential hypertension 05/30/2018   PCP:  Dorothyann Peng, MD Pharmacy:   [facility] [address] [place] - [address] [place] [address] Phone: [phone] Fax: [phone]     Social Determinants of Health (SDOH) Interventions    Readmission Risk Interventions No flowsheet data found.

## 2018-10-14 NOTE — Progress Notes (Signed)
Seen and examined this afternoon, transferred from [facility] long hospital for COVID-19.  She is 58 with a history of hypertension, type 2 diabetes mellitus on Tresiba, hyperlipidemia, who presented to the hospital and was admitted on 10/13/2018 after about 7 days of fever and malaise.  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  She was feeling bad on Tuesday and called out from work on Tuesday and Wednesday, and when her symptoms improved returned to work on Friday.  Her symptoms progressed, she has had progressive myalgias, malaise, fevers and appetite loss, along with a nonproductive dry cough for 3 days and came to the hospital. Patient reports that one of her co-workers at the facility had recently completed 14-day home quarantine and returned to work this week; and she also reports that a facility resident had positive COVID earlier this month and was hospitalized at [facility].  COVID-19 positive associated viral pneumonia with right upper lung infiltrate -Continue supportive treatment, she has been placed on Plaquenil, monitor QTC, she was also placed on azithromycin -Daily monitoring of inflammatory markers, low threshold for Actemra if respiratory status declines, currently on room air.   Scheduled Meds: . azithromycin  250 mg Oral QAC breakfast  . enoxaparin (LOVENOX) injection  40 mg Subcutaneous Q24H  . hydroxychloroquine  200 mg Oral BID  . insulin aspart  0-9 Units Subcutaneous TID WC  . potassium chloride  40 mEq Oral BID   Continuous Infusions: PRN Meds:.acetaminophen, guaiFENesin-dextromethorphan, metoCLOPramide (REGLAN) injection, oxyCODONE  On exam she is comfortable, lungs are clear to auscultation without wheezing or crackles, heart is regular without peripheral edema  Costin M. Elvera Lennox, MD, PhD [facility]  Contact via  [email or web address]  [facility] Office Info P: [phone]  F: [phone]

## 2018-10-14 NOTE — Progress Notes (Signed)
Point of care family member notified of patient pending transfer to [facility]. Questions answered.

## 2018-10-15 ENCOUNTER — Inpatient Hospital Stay (HOSPITAL_COMMUNITY): Payer: PRIVATE HEALTH INSURANCE

## 2018-10-15 DIAGNOSIS — E119 Type 2 diabetes mellitus without complications: Secondary | ICD-10-CM

## 2018-10-15 DIAGNOSIS — D696 Thrombocytopenia, unspecified: Secondary | ICD-10-CM

## 2018-10-15 DIAGNOSIS — E871 Hypo-osmolality and hyponatremia: Secondary | ICD-10-CM

## 2018-10-15 DIAGNOSIS — E876 Hypokalemia: Secondary | ICD-10-CM

## 2018-10-15 DIAGNOSIS — Z794 Long term (current) use of insulin: Secondary | ICD-10-CM

## 2018-10-15 DIAGNOSIS — I10 Essential (primary) hypertension: Secondary | ICD-10-CM

## 2018-10-15 LAB — CBC WITH DIFFERENTIAL/PLATELET
Abs Immature Granulocytes: 0.05 10*3/uL (ref 0.00–0.07)
Basophils Absolute: 0 10*3/uL (ref 0.0–0.1)
Basophils Relative: 0 %
Eosinophils Absolute: 0 10*3/uL (ref 0.0–0.5)
Eosinophils Relative: 0 %
HCT: 31.6 % — ABNORMAL LOW (ref 36.0–46.0)
Hemoglobin: 10.5 g/dL — ABNORMAL LOW (ref 12.0–15.0)
Immature Granulocytes: 1 %
Lymphocytes Relative: 15 %
Lymphs Abs: 1.1 10*3/uL (ref 0.7–4.0)
MCH: 27.7 pg (ref 26.0–34.0)
MCHC: 33.2 g/dL (ref 30.0–36.0)
MCV: 83.4 fL (ref 80.0–100.0)
Monocytes Absolute: 0.2 10*3/uL (ref 0.1–1.0)
Monocytes Relative: 2 %
Neutro Abs: 6.4 10*3/uL (ref 1.7–7.7)
Neutrophils Relative %: 82 %
Platelets: ADEQUATE 10*3/uL (ref 150–400)
RBC: 3.79 MIL/uL — ABNORMAL LOW (ref 3.87–5.11)
RDW: 12.5 % (ref 11.5–15.5)
WBC: 7.7 10*3/uL (ref 4.0–10.5)
nRBC: 0 % (ref 0.0–0.2)

## 2018-10-15 LAB — COMPREHENSIVE METABOLIC PANEL
ALT: 24 U/L (ref 0–44)
AST: 28 U/L (ref 15–41)
Albumin: 3.2 g/dL — ABNORMAL LOW (ref 3.5–5.0)
Alkaline Phosphatase: 73 U/L (ref 38–126)
Anion gap: 9 (ref 5–15)
BUN: 6 mg/dL — ABNORMAL LOW (ref 8–23)
CO2: 21 mmol/L — ABNORMAL LOW (ref 22–32)
Calcium: 8.1 mg/dL — ABNORMAL LOW (ref 8.9–10.3)
Chloride: 102 mmol/L (ref 98–111)
Creatinine, Ser: 0.46 mg/dL (ref 0.44–1.00)
GFR calc Af Amer: >60 mL/min (ref >60)
GFR calc non Af Amer: >60 mL/min (ref >60)
Glucose, Bld: 245 mg/dL — ABNORMAL HIGH (ref 70–99)
Potassium: 3.3 mmol/L — ABNORMAL LOW (ref 3.5–5.1)
Sodium: 132 mmol/L — ABNORMAL LOW (ref 135–145)
Total Bilirubin: 0.7 mg/dL (ref 0.3–1.2)
Total Protein: 7 g/dL (ref 6.5–8.1)

## 2018-10-15 LAB — GLUCOSE, CAPILLARY
Glucose-Capillary: 229 mg/dL — ABNORMAL HIGH (ref 70–99)
Glucose-Capillary: 256 mg/dL — ABNORMAL HIGH (ref 70–99)
Glucose-Capillary: 214 mg/dL — ABNORMAL HIGH (ref 70–99)
Glucose-Capillary: 165 mg/dL — ABNORMAL HIGH (ref 70–99)

## 2018-10-15 LAB — FIBRINOGEN: Fibrinogen: 776 mg/dL — ABNORMAL HIGH (ref 210–475)

## 2018-10-15 LAB — C-REACTIVE PROTEIN: CRP: 11.8 mg/dL — ABNORMAL HIGH (ref <1.0)

## 2018-10-15 LAB — SAVE SMEAR(SSMR), FOR PROVIDER SLIDE REVIEW

## 2018-10-15 LAB — MAGNESIUM: Magnesium: 1.4 mg/dL — ABNORMAL LOW (ref 1.7–2.4)

## 2018-10-15 LAB — D-DIMER, QUANTITATIVE: D-Dimer, Quant: 0.68 ug/mL-FEU — ABNORMAL HIGH (ref 0.00–0.50)

## 2018-10-15 MED ORDER — SODIUM CHLORIDE 0.9 % IV SOLN
INTRAVENOUS | Status: DC | PRN
  Administered 2018-10-15 – 2018-10-16 (×2): 250 mL via INTRAVENOUS

## 2018-10-15 MED ORDER — POTASSIUM CHLORIDE CRYS ER 20 MEQ PO TBCR
40.0000 meq | EXTENDED_RELEASE_TABLET | Freq: Two times a day (BID) | ORAL | Status: AC
  Administered 2018-10-15 (×2): 40 meq via ORAL
  Filled 2018-10-15 (×2): qty 2

## 2018-10-15 MED ORDER — TOCILIZUMAB 200 MG/10ML IV SOLN
8.0000 mg/kg | Freq: Once | INTRAVENOUS | Status: AC
  Administered 2018-10-15: 566 mg via INTRAVENOUS
  Filled 2018-10-15: qty 28.3

## 2018-10-15 MED ORDER — FUROSEMIDE 10 MG/ML IJ SOLN
40.0000 mg | Freq: Once | INTRAMUSCULAR | Status: AC
  Administered 2018-10-15: 40 mg via INTRAVENOUS
  Filled 2018-10-15: qty 4

## 2018-10-15 NOTE — Plan of Care (Signed)
  Problem: Activity: Goal: Ability to tolerate increased activity will improve Outcome: Progressing   Problem: Clinical Measurements: Goal: Ability to maintain a body temperature in the normal range will improve Outcome: Progressing   Problem: Respiratory: Goal: Ability to maintain adequate ventilation will improve Outcome: Progressing Goal: Ability to maintain a clear airway will improve Outcome: Progressing   Problem: Education: Goal: Knowledge of General Education information will improve Description Including pain rating scale, medication(s)/side effects and non-pharmacologic comfort measures Outcome: Progressing   Problem: Health Behavior/Discharge Planning: Goal: Ability to manage health-related needs will improve Outcome: Progressing   Problem: Clinical Measurements: Goal: Ability to maintain clinical measurements within normal limits will improve Outcome: Progressing Goal: Will remain free from infection Outcome: Progressing Goal: Diagnostic test results will improve Outcome: Progressing Goal: Respiratory complications will improve Outcome: Progressing Goal: Cardiovascular complication will be avoided Outcome: Progressing   Problem: Activity: Goal: Risk for activity intolerance will decrease Outcome: Progressing   Problem: Nutrition: Goal: Adequate nutrition will be maintained Outcome: Progressing   Problem: Coping: Goal: Level of anxiety will decrease Outcome: Progressing   Problem: Elimination: Goal: Will not experience complications related to bowel motility Outcome: Progressing Goal: Will not experience complications related to urinary retention Outcome: Progressing

## 2018-10-15 NOTE — Progress Notes (Signed)
PROGRESS NOTE  Shannon [name] XLK:[id number] DOB: 03/26/1956 DOA: 10/12/2018 PCP: Dorothyann Peng, MD   LOS: 2 days   Brief Narrative / Interim history: 88 with a history of hypertension, type 2 diabetes mellitus on Tresiba, hyperlipidemia, who presented to the hospital and was admitted on 10/13/2018 after about 7 days of fever and malaise.  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  She was feeling bad on Tuesday and called out from work on Tuesday and Wednesday, and when her symptoms improved returned to work on Friday.  Her symptoms progressed, she has had progressive myalgias, malaise, fevers and appetite loss, along with a nonproductive dry cough for 3 days and came to the hospital. Patient reports that one of her co-workers at the facility had recently completed 14-day home quarantine and returned to work this week; and she also reports that a facility resident had positive COVID earlier this month and was hospitalized at [facility].  She was hospitalized on 10/12/2018 and transferred to [facility] on 4/21.  Approximate date Covid symptoms started: 10/06/2018.  Today day 9  Subjective: She is feeling overall well this morning, continues to have malaise and intermittently febrile overnight.  She complains of a cough which is the main bothersome symptom.  She states that she slept well, denies any shortness of breath at rest.  She denies any GI symptoms, no abdominal pain, nausea or vomiting.  She denies any chest pain.  Assessment & Plan: Active Problems:   Type 2 diabetes mellitus without complication, with long-term current use of insulin (HCC)   Essential hypertension   COVID-19 virus infection   Hypokalemia   Principal Proble  Acute Covid 19 Viral Illness -Continue supportive treatment, patient was started on hydroxychloroquine as well as azithromycin on 4/20, QTc 442 this morning, continue to closely monitor she seems to be tolerating this well.  -given increase in inflammatory  markers as well as worsening chest x-ray with increased peripheral infiltrates bilaterally but especially in the right lung, will give Actemra today x1.  Chest x-ray personally reviewed.  Screen for hep B as well as QuantiFERON.  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 The treatment plan and use of medications and known side effects were discussed with patient/family, they were clearly explained that there is no proven definitive treatment for COVID-19 infection, any medications used here are based on published clinical articles/anecdotal data which are not peer-reviewed or randomized control trials.  Complete risks and long-term side effects are unknown, however in the best clinical judgment they seem to be of some clinical benefit rather than medical risks.  Patient agree with the treatment plan and want to receive the given medications. Hydroxychloroquine: The use of this medication is off-label, and its efficacy in this clinical situation is not definitively proven. The risks, including death by cardiac arrhythmia as well as side effects of rash, LFT elevations, and rarely retinopathy, were discussed and the patient chooses to proceed  COVID-19 markers -CRP increased from 6.6 on 4/21 to 11.8 on 4/22 -D-dimer 0.68 -Fibrinogen 776 -Ferritin is pending  Oxygenation requirements -Currently on room air  Fluid status -She clinically appears euvolemic but net positive 4.7 L, received fluids on admission.  Replete potassium and give Lasix x1 today.  Also check magnesium levels  Active Problems Thrombocytopenia -Continue to closely watch, monitor d-dimer, PT, APTT, will order a smear.  Hypokalemia -Continue to replete  Mild hyponatremia -  Appears euvolemic, monitor, will receive Lasix today  Type 2 diabetes mellitus -Continue sliding scale  Hypertension -Hold lisinopril  Hyperlipidemia -Hold home atorvastatin   Scheduled  Meds:  azithromycin  250 mg Oral QAC breakfast   enoxaparin (LOVENOX) injection  40 mg Subcutaneous Q24H   hydroxychloroquine  200 mg Oral BID   insulin aspart  0-9 Units Subcutaneous TID WC   Continuous Infusions:  sodium chloride     tocilizumab (ACTEMRA) IV     PRN Meds:.sodium chloride, acetaminophen, guaiFENesin-dextromethorphan, metoCLOPramide (REGLAN) injection, oxyCODONE  DVT prophylaxis: Lovenox Code Status: Full code Family Communication: No family at bedside, discussed directly with patient Disposition Plan: To be determined  Consultants:   None  Procedures:   None   Antimicrobials:  Hydroxy chloroquine 4/20-plan for 5 days, today day 3  Azithromycin 4/20-plan for 5 days, today day 3  Objective: Vitals:   10/15/18 0400 10/15/18 0500 10/15/18 0558 10/15/18 0910  BP: (!) 144/63 139/62    Pulse: 95 84    Resp: (!) 23 (!) 34    Temp:   100.2 F (37.9 C) (!) 101 F (38.3 C)  TempSrc:   Oral Oral  SpO2: 96% 93%    Weight:      Height:        Intake/Output Summary (Last 24 hours) at 10/15/2018 0942 Last data filed at 10/15/2018 0900 Gross per 24 hour  Intake 780 ml  Output --  Net 780 ml   Filed Weights   10/12/18 2200 10/13/18 0200  Weight: 70.3 kg 70.7 kg    Examination:  Constitutional: Appears fatigued but no distress Eyes: PERRL, lids and conjunctivae normal ENMT: Mucous membranes are moist. Neck: normal, supple Respiratory: Diminished breath sounds at the bases, faint rhonchi, no wheezing, no crackles Cardiovascular: Regular rate and rhythm, no murmurs / rubs / gallops. No LE edema. Abdomen: no tenderness. Bowel sounds positive.  Musculoskeletal: no clubbing / cyanosis.  Skin: no rashes Neurologic: CN 2-12 grossly intact. Strength 5/5 in all 4.  Psychiatric: Normal judgment and insight. Alert and oriented x 3. Normal mood.    Data Reviewed: I have independently reviewed following labs and imaging studies  Chest x-ray 4/22  increased right-sided peripheral infiltrates  CBC: Recent Labs  Lab 10/12/18 2105 10/14/18 0525 10/15/18 0500  WBC 5.9 5.2 7.7  NEUTROABS 4.2 3.6 6.4  HGB 12.1 10.7* 10.5*  HCT 36.4 32.6* 31.6*  MCV 85.0 85.8 83.4  PLT 128* 113* PLATELET CLUMPS NOTED ON SMEAR, COUNT APPEARS ADEQUATE   Basic Metabolic Panel: Recent Labs  Lab 10/12/18 2105 10/14/18 0525 10/15/18 0500  NA 130* 135 132*  K 3.3* 3.2* 3.3*  CL 95* 105 102  CO2 22 19* 21*  GLUCOSE 260* 186* 245*  BUN 10 6* 6*  CREATININE 0.66 0.46 0.46  CALCIUM 8.8* 7.8* 8.1*   GFR: Estimated Creatinine Clearance: 67 mL/min (by C-G formula based on SCr of 0.46 mg/dL). Liver Function Tests: Recent Labs  Lab 10/12/18 2105 10/14/18 0525 10/15/18 0500  AST 26 26 28   ALT 25 23 24   ALKPHOS 85 66 73  BILITOT 0.5 0.5 0.7  PROT 7.6 6.4* 7.0  ALBUMIN 3.9 3.1* 3.2*   No results for input(s): LIPASE, AMYLASE in the last 168 hours. No results for input(s): AMMONIA in the last 168 hours. Coagulation Profile: No results for input(s): INR, PROTIME in the last 168 hours. Cardiac Enzymes: Recent Labs  Lab 10/12/18 2105  TROPONINI <0.03   BNP (last 3 results) No results for  input(s): PROBNP in the last 8760 hours. HbA1C: No results for input(s): HGBA1C in the last 72 hours. CBG: Recent Labs  Lab 10/14/18 0728 10/14/18 1152 10/14/18 1643 10/14/18 2021 10/15/18 0735  GLUCAP 173* 181* 133* 213* 229*   Lipid Profile: Recent Labs    10/12/18 2105  TRIG 205*   Thyroid Function Tests: No results for input(s): TSH, T4TOTAL, FREET4, T3FREE, THYROIDAB in the last 72 hours. Anemia Panel: Recent Labs    10/12/18 2105  FERRITIN 528*   Urine analysis:    Component Value Date/Time   COLORURINE YELLOW 06/17/2018 1305   APPEARANCEUR CLEAR 06/17/2018 1305   LABSPEC 1.003 (L) 06/17/2018 1305   PHURINE 7.0 06/17/2018 1305   GLUCOSEU NEGATIVE 06/17/2018 1305   HGBUR NEGATIVE 06/17/2018 1305   BILIRUBINUR negative  06/26/2018 1529   KETONESUR NEGATIVE 06/17/2018 1305   PROTEINUR Positive (A) 06/26/2018 1529   PROTEINUR NEGATIVE 06/17/2018 1305   UROBILINOGEN 0.2 06/26/2018 1529   UROBILINOGEN 1.0 06/17/2018 1151   NITRITE negative 06/26/2018 1529   NITRITE NEGATIVE 06/17/2018 1305   LEUKOCYTESUR Negative 06/26/2018 1529   Sepsis Labs: Invalid input(s): PROCALCITONIN, LACTICIDVEN  Recent Results (from the past 240 hour(s))  SARS Coronavirus 2 [facility] order, Performed in [facility] hospital lab)     Status: Abnormal   Collection Time: 10/12/18  9:05 PM  Result Value Ref Range Status   SARS Coronavirus 2 POSITIVE (A) NEGATIVE Final    Comment: RESULT CALLED TO, READ BACK BY AND VERIFIED WITH: Z,WRIGHT AT 2359 ON 10/12/18 BY A,MOHAMED (NOTE) If result is NEGATIVE SARS-CoV-2 target nucleic acids are NOT DETECTED. The SARS-CoV-2 RNA is generally detectable in upper and lower  respiratory specimens during the acute phase of infection. The lowest  concentration of SARS-CoV-2 viral copies this assay can detect is 250  copies / mL. A negative result does not preclude SARS-CoV-2 infection  and should not be used as the sole basis for treatment or other  patient management decisions.  A negative result may occur with  improper specimen collection / handling, submission of specimen other  than nasopharyngeal swab, presence of viral mutation(s) within the  areas targeted by this assay, and inadequate number of viral copies  (<250 copies / mL). A negative result must be combined with clinical  observations, patient history, and epidemiological information. If result is POSITIVE SARS-CoV-2 target nucleic acids are DETECTE D. The SARS-CoV-2 RNA is generally detectable in upper and lower  respiratory specimens during the acute phase of infection.  Positive  results are indicative of active infection with SARS-CoV-2.  Clinical  correlation with patient history and other diagnostic information is    necessary to determine patient infection status.  Positive results do  not rule out bacterial infection or co-infection with other viruses. If result is PRESUMPTIVE POSTIVE SARS-CoV-2 nucleic acids MAY BE PRESENT.   A presumptive positive result was obtained on the submitted specimen  and confirmed on repeat testing.  While 2019 novel coronavirus  (SARS-CoV-2) nucleic acids may be present in the submitted sample  additional confirmatory testing may be necessary for epidemiological  and / or clinical management purposes  to differentiate between  SARS-CoV-2 and other Sarbecovirus currently known to infect humans.  If clinically indicated additional testing with an alternate test  methodology (LAB745 3) is advised. The SARS-CoV-2 RNA is generally  detectable in upper and lower respiratory specimens during the acute  phase of infection. The expected result is Negative. Fact Sheet for Patients:  BoilerBrush.com.cy Fact Sheet  for Healthcare Providers: https://pope.com/ This test is not yet approved or cleared by the [place] and has been authorized for detection and/or diagnosis of SARS-CoV-2 by FDA under an Emergency Use Authorization (EUA).  This EUA will remain in effect (meaning this test can be used) for the duration of the COVID-19 declaration under Section 564(b)(1) of the Act, 21 U.S.C. section 360bbb-3(b)(1), unless the authorization is terminated or revoked sooner. Performed at [facility], [address], [place], [place] [address]   Blood Culture (routine x 2)     Status: None (Preliminary result)   Collection Time: 10/12/18  9:05 PM  Result Value Ref Range Status   Specimen Description   Final    BLOOD RIGHT HAND Performed at [facility], [address], [place], [place] [address]    Special Requests   Final    BOTTLES DRAWN AEROBIC AND ANAEROBIC Blood Culture results may not  be optimal due to an inadequate volume of blood received in culture bottles Performed at [facility], [address], [place], [place] [address]    Culture   Final    NO GROWTH 1 DAY Performed at [facility], [address], [place], [place] [address]    Report Status PENDING  Incomplete  Blood Culture (routine x 2)     Status: None (Preliminary result)   Collection Time: 10/12/18  9:10 PM  Result Value Ref Range Status   Specimen Description   Final    BLOOD LEFT ANTECUBITAL Performed at [facility], [address], [place], [place] [address]    Special Requests   Final    BOTTLES DRAWN AEROBIC AND ANAEROBIC Blood Culture results may not be optimal due to an excessive volume of blood received in culture bottles Performed at [facility], [address] [place], [place] [address]    Culture   Final    NO GROWTH 1 DAY Performed at [facility], [address], [place], [place] [address]    Report Status PENDING  Incomplete      Radiology Studies: Dg Chest Port 1 View  Result Date: 10/15/2018 CLINICAL DATA:  Dyspnea EXAM: PORTABLE CHEST 1 VIEW COMPARISON:  10/12/2018 FINDINGS: Cardiac shadow is at the upper limits of normal in size but stable. The left lung is clear. Increasing peripheral infiltrates are noted on the right consistent with the given clinical history of COVID-19. No sizable effusion is noted. No bony abnormality is seen. IMPRESSION: Increasing peripheral infiltrates throughout the right lung consistent with the patient's given clinical history. Electronically Signed   By: Alcide Clever M.D.   On: 10/15/2018 08:17     Pamella Pert, MD, PhD [facility]  Contact via  [email or web address]  [facility] Office Info P: [phone]  F: [phone]

## 2018-10-15 NOTE — Progress Notes (Addendum)
Inpatient Diabetes Program Recommendations  AACE/ADA: New Consensus Statement on Inpatient Glycemic Control  Target Ranges:  Prepandial:   less than 140 mg/dL      Peak postprandial:   less than 180 mg/dL (1-2 hours)      Critically ill patients:  140 - 180 mg/dL  Results for Shannon [name], Shannon [name] (MRN [id number]) as of 10/15/2018 10:18  Ref. Range 10/14/2018 07:28 10/14/2018 11:52 10/14/2018 16:43 10/14/2018 20:21 10/15/2018 07:35  Glucose-Capillary Latest Ref Range: 70 - 99 mg/dL 196 (H) 222 (H) 979 (H) 213 (H) 229 (H)   Results for Shannon [name], Shannon [name] (MRN [id number]) as of 10/15/2018 10:18  Ref. Range 05/30/2018 10:13 08/29/2018 10:37  Hemoglobin A1C Latest Ref Range: 4.8 - 5.6 % 8.6 (H) 11.8 (H)   Review of Glycemic Control  Diabetes history: DM2 Outpatient Diabetes medications: Tresiba 21 units QAM, Metformin 500 mg QAM, Ozempic 0.25 mg Qweek  Current orders for Inpatient glycemic control: Novokog 0-9 units TID with meals  Inpatient Diabetes Program Recommendations:   Correction (SSI): Please consider ordering Novolog 0-5 units QHS for bedtime correction.  HgbA1C: A1C 11.8% on 08/29/18 indicating an average glucose of 292 mg/dl. Per chart, patient seen J. Christell Constant, FNP on 08/29/18 and had noted that she had not taken Ozempic in several months due to insurance.  NOTE: Attempted to call patient over phone but could not get through. Called nursing station and was transferred to room phone but phone busy at this time. Will try again later.  Addendum 10/14/08@12 :54- Spoke with patient over the phone regarding DM control and recent A1C. Patient states that she is taking DM medications as noted above. Patient reports that she had went a couple of months without the Ozempic due to issue with insurance approving the medication. Patient states that since her office visit with PCP on 08/29/18, she has gotten insurance approval to cover the Ozempic and she has been able to restart the medication. Patient reports that her  copay for the Ozempic is $25 with a savings card and she is able to afford all DM medications. Reviewed last A1C of 11.8% on 08/29/18 and patient notes that A1C was high because she had been without the Ozempic for several months. Patient states that her glucose has been in the low to mid 100's mg/dl since she is back on all DM medications. Encouraged patient to continue to follow up with PCP regarding DM control. Patient verbalized understanding of information discussed and she states that she has no further questions at this time.  Thanks, [name], RN, MSN, CDE Diabetes Coordinator Inpatient Diabetes Program [phone] (Team Pager from 8am to 5pm)

## 2018-10-16 DIAGNOSIS — J181 Lobar pneumonia, unspecified organism: Secondary | ICD-10-CM

## 2018-10-16 LAB — APTT: aPTT: 36 seconds (ref 24–36)

## 2018-10-16 LAB — D-DIMER, QUANTITATIVE: D-Dimer, Quant: 0.89 ug/mL-FEU — ABNORMAL HIGH (ref 0.00–0.50)

## 2018-10-16 LAB — PROTIME-INR
INR: 1 (ref 0.8–1.2)
Prothrombin Time: 13.3 seconds (ref 11.4–15.2)

## 2018-10-16 LAB — CBC WITH DIFFERENTIAL/PLATELET
Abs Immature Granulocytes: 0.08 10*3/uL — ABNORMAL HIGH (ref 0.00–0.07)
Basophils Absolute: 0 10*3/uL (ref 0.0–0.1)
Basophils Relative: 0 %
Eosinophils Absolute: 0 10*3/uL (ref 0.0–0.5)
Eosinophils Relative: 0 %
HCT: 33.8 % — ABNORMAL LOW (ref 36.0–46.0)
Hemoglobin: 11.1 g/dL — ABNORMAL LOW (ref 12.0–15.0)
Immature Granulocytes: 1 %
Lymphocytes Relative: 13 %
Lymphs Abs: 0.8 10*3/uL (ref 0.7–4.0)
MCH: 27.5 pg (ref 26.0–34.0)
MCHC: 32.8 g/dL (ref 30.0–36.0)
MCV: 83.7 fL (ref 80.0–100.0)
Monocytes Absolute: 0.2 10*3/uL (ref 0.1–1.0)
Monocytes Relative: 3 %
Neutro Abs: 5.5 10*3/uL (ref 1.7–7.7)
Neutrophils Relative %: 83 %
Platelets: 139 10*3/uL — ABNORMAL LOW (ref 150–400)
RBC: 4.04 MIL/uL (ref 3.87–5.11)
RDW: 12.5 % (ref 11.5–15.5)
WBC: 6.6 10*3/uL (ref 4.0–10.5)
nRBC: 0 % (ref 0.0–0.2)

## 2018-10-16 LAB — COMPREHENSIVE METABOLIC PANEL
ALT: 31 U/L (ref 0–44)
AST: 42 U/L — ABNORMAL HIGH (ref 15–41)
Albumin: 3.3 g/dL — ABNORMAL LOW (ref 3.5–5.0)
Alkaline Phosphatase: 77 U/L (ref 38–126)
Anion gap: 10 (ref 5–15)
BUN: 10 mg/dL (ref 8–23)
CO2: 23 mmol/L (ref 22–32)
Calcium: 8.6 mg/dL — ABNORMAL LOW (ref 8.9–10.3)
Chloride: 102 mmol/L (ref 98–111)
Creatinine, Ser: 0.47 mg/dL (ref 0.44–1.00)
GFR calc Af Amer: >60 mL/min (ref >60)
GFR calc non Af Amer: >60 mL/min (ref >60)
Glucose, Bld: 155 mg/dL — ABNORMAL HIGH (ref 70–99)
Potassium: 4.1 mmol/L (ref 3.5–5.1)
Sodium: 135 mmol/L (ref 135–145)
Total Bilirubin: 0.7 mg/dL (ref 0.3–1.2)
Total Protein: 7.4 g/dL (ref 6.5–8.1)

## 2018-10-16 LAB — QUANTIFERON-TB GOLD PLUS (RQFGPL)
QuantiFERON Mitogen Value: 1.45 IU/mL
QuantiFERON Nil Value: 0.18 IU/mL
QuantiFERON TB1 Ag Value: 0.19 IU/mL
QuantiFERON TB2 Ag Value: 0.2 IU/mL

## 2018-10-16 LAB — HEPATITIS B SURFACE ANTIGEN: Hepatitis B Surface Ag: NEGATIVE

## 2018-10-16 LAB — GLUCOSE, CAPILLARY
Glucose-Capillary: 152 mg/dL — ABNORMAL HIGH (ref 70–99)
Glucose-Capillary: 143 mg/dL — ABNORMAL HIGH (ref 70–99)
Glucose-Capillary: 182 mg/dL — ABNORMAL HIGH (ref 70–99)
Glucose-Capillary: 155 mg/dL — ABNORMAL HIGH (ref 70–99)

## 2018-10-16 LAB — MAGNESIUM: Magnesium: 1.5 mg/dL — ABNORMAL LOW (ref 1.7–2.4)

## 2018-10-16 LAB — FERRITIN: Ferritin: 916 ng/mL — ABNORMAL HIGH (ref 11–307)

## 2018-10-16 LAB — FIBRINOGEN: Fibrinogen: >800 mg/dL — ABNORMAL HIGH (ref 210–475)

## 2018-10-16 LAB — PATHOLOGIST SMEAR REVIEW

## 2018-10-16 LAB — QUANTIFERON-TB GOLD PLUS: QuantiFERON-TB Gold Plus: NEGATIVE

## 2018-10-16 LAB — HEPATITIS B SURFACE ANTIBODY, QUANTITATIVE: Hepatitis B-Post: >1000 m[IU]/mL (ref >9.9)

## 2018-10-16 LAB — C-REACTIVE PROTEIN: CRP: 14.8 mg/dL — ABNORMAL HIGH (ref <1.0)

## 2018-10-16 MED ORDER — ALUM & MAG HYDROXIDE-SIMETH 200-200-20 MG/5ML PO SUSP
30.0000 mL | Freq: Four times a day (QID) | ORAL | Status: DC | PRN
  Administered 2018-10-16: 12:00:00 30 mL via ORAL
  Filled 2018-10-16: qty 30

## 2018-10-16 MED ORDER — FUROSEMIDE 10 MG/ML IJ SOLN
60.0000 mg | Freq: Every day | INTRAMUSCULAR | Status: DC
  Administered 2018-10-16 – 2018-10-18 (×3): 60 mg via INTRAVENOUS
  Filled 2018-10-16: qty 8
  Filled 2018-10-16: qty 6
  Filled 2018-10-16: qty 8

## 2018-10-16 MED ORDER — MAGNESIUM SULFATE 2 GM/50ML IV SOLN
2.0000 g | Freq: Once | INTRAVENOUS | Status: AC
  Administered 2018-10-16: 13:00:00 2 g via INTRAVENOUS
  Filled 2018-10-16: qty 50

## 2018-10-16 MED ORDER — INSULIN GLARGINE 100 UNIT/ML ~~LOC~~ SOLN
5.0000 [IU] | Freq: Every day | SUBCUTANEOUS | Status: DC
  Administered 2018-10-16 – 2018-10-18 (×3): 5 [IU] via SUBCUTANEOUS
  Filled 2018-10-16 (×3): qty 0.05

## 2018-10-16 NOTE — Progress Notes (Signed)
Inpatient Diabetes Program Recommendations  AACE/ADA: New Consensus Statement on Inpatient Glycemic Control   Target Ranges:  Prepandial:   less than 140 mg/dL      Peak postprandial:   less than 180 mg/dL (1-2 hours)      Critically ill patients:  140 - 180 mg/dL  Results for LADINA, WALMSLEY (MRN [id number]) as of 10/16/2018 09:22  Ref. Range 10/15/2018 07:35 10/15/2018 12:02 10/15/2018 16:48 10/15/2018 21:11  Glucose-Capillary Latest Ref Range: 70 - 99 mg/dL 381 (H) 840 (H) 375 (H) 165 (H)    Review of Glycemic Control  Diabetes history: DM2 Outpatient Diabetes medications: Tresiba 21 units QAM, Metformin 500 mg QAM, Ozempic 0.25 mg Qweek Current orders for Inpatient glycemic control: Novokog 0-9 units TID with meals  Inpatient Diabetes Program Recommendations:   Insulin - Basal: Please consider ordering Lantus 5 units daily.  Correction (SSI): Please consider ordering Novolog 0-5 units QHS for bedtime correction.  HgbA1C: A1C 11.8% on 08/29/18 indicating an average glucose of 292 mg/dl. Per chart, patient seen J. Christell Constant, FNP on 08/29/18 and had noted that she had not taken Ozempic in several months due to insurance (would not approve coverage for Ozempic). Spoke with patient on 10/15/18 over the phone and they are now covering Ozempic and patient reports she has all her DM medications and CBGs are usually in low to mid 100's mg/dl.  Thanks, [name], RN, MSN, CDE Diabetes Coordinator Inpatient Diabetes Program [phone] (Team Pager from 8am to 5pm)

## 2018-10-16 NOTE — Progress Notes (Signed)
PROGRESS NOTE  Shannon [name] JYN:[id number] DOB: 07-Jun-1956 DOA: 10/12/2018 PCP: Dorothyann Peng, MD   LOS: 3 days   Brief Narrative / Interim history:  41 with a history of hypertension, type 2 diabetes mellitus on Tresiba, hyperlipidemia, who presented to the hospital and was admitted on 10/13/2018 after about 7 days of fever and malaise.  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  She was feeling bad on Tuesday and called out from work on Tuesday and Wednesday, and when her symptoms improved returned to work on Friday.  Her symptoms progressed, she has had progressive myalgias, malaise, fevers and appetite loss, along with a nonproductive dry cough for 3 days and came to the hospital. Patient reports that one of her co-workers at the facility had recently completed 14-day home quarantine and returned to work this week; and she also reports that a facility resident had positive COVID earlier this month and was hospitalized at [facility].  She was hospitalized on 10/12/2018 and transferred to [facility] on 4/21.  Approximate date Covid symptoms started: 10/06/2018.  Today day 9  Subjective:  Reports he is feeling better this morning, dyspnea has improved, afebrile over last 24 hours .  Assessment & Plan: Active Problems:   Type 2 diabetes mellitus without complication, with long-term current use of insulin (HCC)   Essential hypertension   COVID-19 virus infection   Hypokalemia    Acute Covid 19 Viral Illness -Continue supportive treatment, patient was started on hydroxychloroquine as well as azithromycin on 4/20, QTC is 460 this morning, monitoring magnesium and potassium closely, magnesium was 1.4 yesterday, will give 2 g of magnesium today . -Received Actemra 10/15/2018, given worsening inflammatory markers and lung opacity in chest x-ray.  Screen for hep B as well as QuantiFERON.  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  The treatment plan and use of medications and known side effects were discussed with patient/family, they were clearly explained that there is no proven definitive treatment for COVID-19 infection, any medications used here are based on published clinical articles/anecdotal data which are not peer-reviewed or randomized control trials.  Complete risks and long-term side effects are unknown, however in the best clinical judgment they seem to be of some clinical benefit rather than medical risks.  Patient agree with the treatment plan and want to receive the given medications. Hydroxychloroquine: The use of this medication is off-label, and its efficacy in this clinical situation is not definitively proven. The risks, including death by cardiac arrhythmia as well as side effects of rash, LFT elevations, and rarely retinopathy, were discussed and the patient chooses to proceed -Need to monitor inflammatory markers closely, especially most recent showing increase in D-dimers, ferritin and CRP -Continue with IV Lasix, started on 60 mg IV daily  Thrombocytopenia -Continue to closely watch,   Hypokalemia -Continue to replete  Mild hyponatremia -Appears euvolemic, monitor, will receive Lasix today  Type 2 diabetes mellitus -Continue sliding scale, will start on low-dose Lantus  Hypertension -Hold lisinopril  Hyperlipidemia -Hold home atorvastatin   Scheduled Meds: . azithromycin  250 mg Oral QAC breakfast  . enoxaparin (LOVENOX) injection  40 mg Subcutaneous Q24H  . hydroxychloroquine  200 mg Oral BID  . insulin aspart  0-9 Units Subcutaneous TID WC   Continuous Infusions: . sodium chloride Stopped (10/15/18 1530)   PRN Meds:.sodium chloride, acetaminophen, alum & mag hydroxide-simeth, guaiFENesin-dextromethorphan, metoCLOPramide (REGLAN) injection, oxyCODONE  DVT prophylaxis: Lovenox Code Status: Full code Family Communication: No family at bedside, discussed directly with patient  Disposition Plan: To be determined  Consultants:   None  Procedures:   None   Antimicrobials:  Hydroxy chloroquine 4/20-plan for 5 days, today day 4  Azithromycin 4/20-plan for 5 days, today day 4  Objective: Vitals:   10/16/18 0400 10/16/18 0500 10/16/18 0600 10/16/18 0839  BP:    134/72  Pulse: 83 81  92  Resp: (!) 37 (!) 35  19  Temp:   98.9 F (37.2 C) 98.9 F (37.2 C)  TempSrc:   Oral Oral  SpO2: 93% 94%  94%  Weight:      Height:        Intake/Output Summary (Last 24 hours) at 10/16/2018 1205 Last data filed at 10/16/2018 0800 Gross per 24 hour  Intake 708.81 ml  Output 0 ml  Net 708.81 ml   Filed Weights   10/12/18 2200 10/13/18 0200  Weight: 70.3 kg 70.7 kg    Examination:   Awake Alert, Oriented X 3, No new F.N deficits, Normal affect Symmetrical Chest wall movement, Good air movement bilaterally, CTAB RRR,No Gallops,Rubs or new Murmurs, No Parasternal Heave +ve B.Sounds, Abd Soft, No tenderness, No rebound - guarding or rigidity. No Cyanosis, Clubbing or edema, No new Rash or bruise     CBC: Recent Labs  Lab 10/12/18 2105 10/14/18 0525 10/15/18 0500 10/16/18 0500  WBC 5.9 5.2 7.7 6.6  NEUTROABS 4.2 3.6 6.4 5.5  HGB 12.1 10.7* 10.5* 11.1*  HCT 36.4 32.6* 31.6* 33.8*  MCV 85.0 85.8 83.4 83.7  PLT 128* 113* PLATELET CLUMPS NOTED ON SMEAR, COUNT APPEARS ADEQUATE 139*   Basic Metabolic Panel: Recent Labs  Lab 10/12/18 2105 10/14/18 0525 10/15/18 0500 10/16/18 0500  NA 130* 135 132* 135  K 3.3* 3.2* 3.3* 4.1  CL 95* 105 102 102  CO2 22 19* 21* 23  GLUCOSE 260* 186* 245* 155*  BUN 10 6* 6* 10  CREATININE 0.66 0.46 0.46 0.47  CALCIUM 8.8* 7.8* 8.1* 8.6*  MG  --   --  1.4*  --    GFR: Estimated Creatinine Clearance: 67 mL/min (by C-G formula based on SCr of 0.47 mg/dL). Liver Function Tests: Recent Labs  Lab 10/12/18 2105 10/14/18 0525 10/15/18 0500 10/16/18 0500  AST 26 26 28  42*  ALT 25 23 24 31   ALKPHOS 85 66 73 77   BILITOT 0.5 0.5 0.7 0.7  PROT 7.6 6.4* 7.0 7.4  ALBUMIN 3.9 3.1* 3.2* 3.3*   No results for input(s): LIPASE, AMYLASE in the last 168 hours. No results for input(s): AMMONIA in the last 168 hours. Coagulation Profile: Recent Labs  Lab 10/16/18 0500  INR 1.0   Cardiac Enzymes: Recent Labs  Lab 10/12/18 2105  TROPONINI <0.03   BNP (last 3 results) No results for input(s): PROBNP in the last 8760 hours. HbA1C: No results for input(s): HGBA1C in the last 72 hours. CBG: Recent Labs  Lab 10/14/18 2021 10/15/18 0735 10/15/18 1202 10/15/18 1648 10/15/18 2111  GLUCAP 213* 229* 256* 214* 165*   Lipid Profile: No results for input(s): CHOL, HDL, LDLCALC, TRIG, CHOLHDL, LDLDIRECT in the last 72 hours. Thyroid Function Tests: No results for input(s): TSH, T4TOTAL, FREET4, T3FREE, THYROIDAB in the last 72 hours. Anemia Panel: Recent Labs    10/16/18 0500  FERRITIN 916*   Urine analysis:    Component Value Date/Time   COLORURINE YELLOW 06/17/2018 1305   APPEARANCEUR CLEAR 06/17/2018  1305   LABSPEC 1.003 (L) 06/17/2018 1305   PHURINE 7.0 06/17/2018 1305   GLUCOSEU NEGATIVE 06/17/2018 1305   HGBUR NEGATIVE 06/17/2018 1305   BILIRUBINUR negative 06/26/2018 1529   KETONESUR NEGATIVE 06/17/2018 1305   PROTEINUR Positive (A) 06/26/2018 1529   PROTEINUR NEGATIVE 06/17/2018 1305   UROBILINOGEN 0.2 06/26/2018 1529   UROBILINOGEN 1.0 06/17/2018 1151   NITRITE negative 06/26/2018 1529   NITRITE NEGATIVE 06/17/2018 1305   LEUKOCYTESUR Negative 06/26/2018 1529   Sepsis Labs: Invalid input(s): PROCALCITONIN, LACTICIDVEN  Recent Results (from the past 240 hour(s))  SARS Coronavirus 2 [facility] order, Performed in [facility] hospital lab)     Status: Abnormal   Collection Time: 10/12/18  9:05 PM  Result Value Ref Range Status   SARS Coronavirus 2 POSITIVE (A) NEGATIVE Final    Comment: RESULT CALLED TO, READ BACK BY AND VERIFIED WITH: Z,WRIGHT AT 2359 ON 10/12/18 BY A,MOHAMED  (NOTE) If result is NEGATIVE SARS-CoV-2 target nucleic acids are NOT DETECTED. The SARS-CoV-2 RNA is generally detectable in upper and lower  respiratory specimens during the acute phase of infection. The lowest  concentration of SARS-CoV-2 viral copies this assay can detect is 250  copies / mL. A negative result does not preclude SARS-CoV-2 infection  and should not be used as the sole basis for treatment or other  patient management decisions.  A negative result may occur with  improper specimen collection / handling, submission of specimen other  than nasopharyngeal swab, presence of viral mutation(s) within the  areas targeted by this assay, and inadequate number of viral copies  (<250 copies / mL). A negative result must be combined with clinical  observations, patient history, and epidemiological information. If result is POSITIVE SARS-CoV-2 target nucleic acids are DETECTE D. The SARS-CoV-2 RNA is generally detectable in upper and lower  respiratory specimens during the acute phase of infection.  Positive  results are indicative of active infection with SARS-CoV-2.  Clinical  correlation with patient history and other diagnostic information is  necessary to determine patient infection status.  Positive results do  not rule out bacterial infection or co-infection with other viruses. If result is PRESUMPTIVE POSTIVE SARS-CoV-2 nucleic acids MAY BE PRESENT.   A presumptive positive result was obtained on the submitted specimen  and confirmed on repeat testing.  While 2019 novel coronavirus  (SARS-CoV-2) nucleic acids may be present in the submitted sample  additional confirmatory testing may be necessary for epidemiological  and / or clinical management purposes  to differentiate between  SARS-CoV-2 and other Sarbecovirus currently known to infect humans.  If clinically indicated additional testing with an alternate test  methodology (LAB745 3) is advised. The SARS-CoV-2 RNA is  generally  detectable in upper and lower respiratory specimens during the acute  phase of infection. The expected result is Negative. Fact Sheet for Patients:  BoilerBrush.com.cy Fact Sheet for Healthcare Providers: https://pope.com/ This test is not yet approved or cleared by the [place] FDA and has been authorized for detection and/or diagnosis of SARS-CoV-2 by FDA under an Emergency Use Authorization (EUA).  This EUA will remain in effect (meaning this test can be used) for the duration of the COVID-19 declaration under Section 564(b)(1) of the Act, 21 U.S.C. section 360bbb-3(b)(1), unless the authorization is terminated or revoked sooner. Performed at [facility], [address], [place], [place] [address]   Blood Culture (routine x 2)     Status: None (Preliminary result)   Collection Time: 10/12/18  9:05 PM  Result Value Ref Range Status   Specimen Description   Final    BLOOD RIGHT HAND Performed at [facility], [address], [place], [place] [address]    Special Requests   Final    BOTTLES DRAWN AEROBIC AND ANAEROBIC Blood Culture results may not be optimal due to an inadequate volume of blood received in culture bottles Performed at [facility], [address] [place], [place] [address]    Culture   Final    NO GROWTH 3 DAYS Performed at [facility], [address], [place], [place] [address]    Report Status PENDING  Incomplete  Blood Culture (routine x 2)     Status: None (Preliminary result)   Collection Time: 10/12/18  9:10 PM  Result Value Ref Range Status   Specimen Description   Final    BLOOD LEFT ANTECUBITAL Performed at [facility], [address], [place], [place] [address]    Special Requests   Final    BOTTLES DRAWN AEROBIC AND ANAEROBIC Blood Culture results may not be optimal due to an excessive volume of blood  received in culture bottles Performed at [facility], [address] [place], [place] [address]    Culture   Final    NO GROWTH 3 DAYS Performed at [facility], [address], [place], [place] [address]    Report Status PENDING  Incomplete      Radiology Studies: Dg Chest Port 1 View  Result Date: 10/15/2018 CLINICAL DATA:  Dyspnea EXAM: PORTABLE CHEST 1 VIEW COMPARISON:  10/12/2018 FINDINGS: Cardiac shadow is at the upper limits of normal in size but stable. The left lung is clear. Increasing peripheral infiltrates are noted on the right consistent with the given clinical history of COVID-19. No sizable effusion is noted. No bony abnormality is seen. IMPRESSION: Increasing peripheral infiltrates throughout the right lung consistent with the patient's given clinical history. Electronically Signed   By: Alcide Clever M.D.   On: 10/15/2018 08:17     Huey Bienenstock MD [facility]  Contact via  [email or web address]  [facility] Office Info P: [phone]  F: [phone]

## 2018-10-17 LAB — COMPREHENSIVE METABOLIC PANEL
ALT: 38 U/L (ref 0–44)
AST: 50 U/L — ABNORMAL HIGH (ref 15–41)
Albumin: 3.4 g/dL — ABNORMAL LOW (ref 3.5–5.0)
Alkaline Phosphatase: 77 U/L (ref 38–126)
Anion gap: 12 (ref 5–15)
BUN: 17 mg/dL (ref 8–23)
CO2: 23 mmol/L (ref 22–32)
Calcium: 8.6 mg/dL — ABNORMAL LOW (ref 8.9–10.3)
Chloride: 99 mmol/L (ref 98–111)
Creatinine, Ser: 0.5 mg/dL (ref 0.44–1.00)
GFR calc Af Amer: >60 mL/min (ref >60)
GFR calc non Af Amer: >60 mL/min (ref >60)
Glucose, Bld: 149 mg/dL — ABNORMAL HIGH (ref 70–99)
Potassium: 3.7 mmol/L (ref 3.5–5.1)
Sodium: 134 mmol/L — ABNORMAL LOW (ref 135–145)
Total Bilirubin: 0.6 mg/dL (ref 0.3–1.2)
Total Protein: 7.4 g/dL (ref 6.5–8.1)

## 2018-10-17 LAB — CBC WITH DIFFERENTIAL/PLATELET
Abs Immature Granulocytes: 0.38 10*3/uL — ABNORMAL HIGH (ref 0.00–0.07)
Basophils Absolute: 0 10*3/uL (ref 0.0–0.1)
Basophils Relative: 1 %
Eosinophils Absolute: 0 10*3/uL (ref 0.0–0.5)
Eosinophils Relative: 0 %
HCT: 35 % — ABNORMAL LOW (ref 36.0–46.0)
Hemoglobin: 11.7 g/dL — ABNORMAL LOW (ref 12.0–15.0)
Immature Granulocytes: 4 %
Lymphocytes Relative: 13 %
Lymphs Abs: 1.1 10*3/uL (ref 0.7–4.0)
MCH: 28 pg (ref 26.0–34.0)
MCHC: 33.4 g/dL (ref 30.0–36.0)
MCV: 83.7 fL (ref 80.0–100.0)
Monocytes Absolute: 0.2 10*3/uL (ref 0.1–1.0)
Monocytes Relative: 2 %
Neutro Abs: 7 10*3/uL (ref 1.7–7.7)
Neutrophils Relative %: 80 %
Platelets: 181 10*3/uL (ref 150–400)
RBC: 4.18 MIL/uL (ref 3.87–5.11)
RDW: 12.6 % (ref 11.5–15.5)
WBC: 8.7 10*3/uL (ref 4.0–10.5)
nRBC: 0 % (ref 0.0–0.2)

## 2018-10-17 LAB — GLUCOSE, CAPILLARY
Glucose-Capillary: 161 mg/dL — ABNORMAL HIGH (ref 70–99)
Glucose-Capillary: 163 mg/dL — ABNORMAL HIGH (ref 70–99)
Glucose-Capillary: 203 mg/dL — ABNORMAL HIGH (ref 70–99)

## 2018-10-17 LAB — FIBRINOGEN: Fibrinogen: >800 mg/dL — ABNORMAL HIGH (ref 210–475)

## 2018-10-17 LAB — APTT: aPTT: 30 seconds (ref 24–36)

## 2018-10-17 LAB — C-REACTIVE PROTEIN: CRP: 5 mg/dL — ABNORMAL HIGH (ref <1.0)

## 2018-10-17 LAB — PROTIME-INR
INR: 1 (ref 0.8–1.2)
Prothrombin Time: 12.8 seconds (ref 11.4–15.2)

## 2018-10-17 LAB — FERRITIN: Ferritin: 1468 ng/mL — ABNORMAL HIGH (ref 11–307)

## 2018-10-17 LAB — D-DIMER, QUANTITATIVE: D-Dimer, Quant: 1.42 ug/mL-FEU — ABNORMAL HIGH (ref 0.00–0.50)

## 2018-10-17 LAB — MAGNESIUM: Magnesium: 2 mg/dL (ref 1.7–2.4)

## 2018-10-17 MED ORDER — POTASSIUM CHLORIDE CRYS ER 20 MEQ PO TBCR
40.0000 meq | EXTENDED_RELEASE_TABLET | Freq: Once | ORAL | Status: AC
  Administered 2018-10-17: 40 meq via ORAL
  Filled 2018-10-17: qty 2

## 2018-10-17 MED ORDER — MAGNESIUM SULFATE IN D5W 1-5 GM/100ML-% IV SOLN
1.0000 g | Freq: Once | INTRAVENOUS | Status: AC
  Administered 2018-10-17: 13:00:00 1 g via INTRAVENOUS
  Filled 2018-10-17: qty 100

## 2018-10-17 NOTE — Progress Notes (Addendum)
1135  Patients O2 sat reading 86% on room air while speaking with MD.  Encouraged incentive spirometry.  Probe changed.  O2 sat 96% after probe change.   1330  Back to bed from chair and in prone position.  No distress noted or reported from patient.    1730  Remains in prone position.  Sleeping.  Awakens easily.

## 2018-10-17 NOTE — Progress Notes (Signed)
PROGRESS NOTE  STARLIN PANIK UUE:[id number] DOB: 12-Mar-1956 DOA: 10/12/2018 PCP: Dorothyann Peng, MD   LOS: 4 days   Brief Narrative / Interim history:  2 with a history of hypertension, type 2 diabetes mellitus on Tresiba, hyperlipidemia, who presented to the hospital and was admitted on 10/13/2018 after about 7 days of fever and malaise.  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  She was feeling bad on Tuesday and called out from work on Tuesday and Wednesday, and when her symptoms improved returned to work on Friday.  Her symptoms progressed, she has had progressive myalgias, malaise, fevers and appetite loss, along with a nonproductive dry cough for 3 days and came to the hospital. Patient reports that one of her co-workers at the facility had recently completed 14-day home quarantine and returned to work this week; and she also reports that a facility resident had positive COVID earlier this month and was hospitalized at [facility].  She was hospitalized on 10/12/2018 and transferred to [facility] on 4/21.   Subjective:  Reports she has been compliant compliant with prone positions, reports some dyspnea, but overall is improving   assessment & Plan: Active Problems:   Type 2 diabetes mellitus without complication, with long-term current use of insulin (HCC)   Essential hypertension   COVID-19 virus infection   Hypokalemia    Acute Covid 19 Viral Illness -Continue supportive treatment, patient was treated with hydroxychloroquine, and azithromycin, finished total of 5 days, QTC is 430 today, monitor magnesium and potassium closely, keep magnesium >2, and potassium >4. -Received Actemra 10/15/2018, given worsening inflammatory markers and lung opacity in chest x-ray.   she is a Research scientist (physical sciences) and has had vaccination for hep B and is regularly screened for TB per patient.  Her hepatitis B surface antibody was negative, and QuantiFERON is negative as well . the treatment plan and use  of medications and known side effects were discussed with patient/family, they were clearly explained that there is no proven definitive treatment for COVID-19 infection, any medications used here are based on published clinical articles/anecdotal data which are not peer-reviewed or randomized control trials.  Complete risks and long-term side effects are unknown, however in the best clinical judgment they seem to be of some clinical benefit rather than medical risks.  Patient agree with the treatment plan and want to receive the given medications. Hydroxychloroquine: The use of this medication is off-label, and its efficacy in this clinical situation is not definitively proven. The risks, including death by cardiac arrhythmia as well as side effects of rash, LFT elevations, and rarely retinopathy, were discussed and the patient chooses to proceed -Continue to follow inflammatory markers closely, CRP trending down, but D-dimers and ferritin are going up . -Continue with IV Lasix 60 mg IV daily . -Was saturating in the mid to high 80s while talking on room air today, this has resolved after she was encouraged to use incentive spirometry, continue to monitor closely, and will start on oxygen if her oxygen saturation drops less than 92% .  Thrombocytopenia -Continue to closely watch, improving today.  Hypokalemia -Continue to replete  Mild hyponatremia -Appears euvolemic, monitor closley  Type 2 diabetes mellitus -Continue sliding scale, better controlled after starting low-dose Lantus  Hypertension -Hold lisinopril  Hyperlipidemia -Hold home atorvastatin   Scheduled Meds: . azithromycin  250 mg Oral QAC breakfast  . enoxaparin (LOVENOX) injection  40 mg Subcutaneous Q24H  . furosemide  60 mg Intravenous Daily  .  hydroxychloroquine  200 mg Oral BID  . insulin aspart  0-9 Units Subcutaneous TID WC  . insulin glargine  5 Units Subcutaneous Daily   Continuous Infusions: . sodium chloride  Stopped (10/16/18 1448)   PRN Meds:.sodium chloride, acetaminophen, alum & mag hydroxide-simeth, guaiFENesin-dextromethorphan, metoCLOPramide (REGLAN) injection, oxyCODONE  DVT prophylaxis: Lovenox Code Status: Full code Family Communication: No family at bedside, discussed directly with patient(offered to call family members if patient is interested) Disposition Plan: To be determined  Consultants:   None  Procedures:   None   Antimicrobials:  Hydroxy chloroquine 4/20-4/25  Azithromycin 4/20-4/25  Objective: Vitals:   10/17/18 0558 10/17/18 0600 10/17/18 0804 10/17/18 1034  BP: 115/62 115/62  109/81  Pulse: 85 86  93  Resp: (!) 33 (!) 31  20  Temp: 98.8 F (37.1 C)  98.9 F (37.2 C) 98.2 F (36.8 C)  TempSrc: Oral  Oral Oral  SpO2: 91% 93%  98%  Weight:      Height:        Intake/Output Summary (Last 24 hours) at 10/17/2018 1137 Last data filed at 10/16/2018 1457 Gross per 24 hour  Intake 298.56 ml  Output -  Net 298.56 ml   Filed Weights   10/12/18 2200 10/13/18 0200  Weight: 70.3 kg 70.7 kg    Examination:  Awake Alert, Oriented X 3, No new F.N deficits, Normal affect Symmetrical Chest wall movement, Good air movement bilaterally, CTAB RRR,No Gallops,Rubs or new Murmurs, No Parasternal Heave +ve B.Sounds, Abd Soft, No tenderness, No rebound - guarding or rigidity. No Cyanosis, Clubbing or edema, No new Rash or bruise       CBC: Recent Labs  Lab 10/12/18 2105 10/14/18 0525 10/15/18 0500 10/16/18 0500 10/17/18 0430  WBC 5.9 5.2 7.7 6.6 8.7  NEUTROABS 4.2 3.6 6.4 5.5 7.0  HGB 12.1 10.7* 10.5* 11.1* 11.7*  HCT 36.4 32.6* 31.6* 33.8* 35.0*  MCV 85.0 85.8 83.4 83.7 83.7  PLT 128* 113* PLATELET CLUMPS NOTED ON SMEAR, COUNT APPEARS ADEQUATE 139* 181   Basic Metabolic Panel: Recent Labs  Lab 10/12/18 2105 10/14/18 0525 10/15/18 0500 10/16/18 0500 10/17/18 0430  NA 130* 135 132* 135 134*  K 3.3* 3.2* 3.3* 4.1 3.7  CL 95* 105 102 102 99   CO2 22 19* 21* 23 23  GLUCOSE 260* 186* 245* 155* 149*  BUN 10 6* 6* 10 17  CREATININE 0.66 0.46 0.46 0.47 0.50  CALCIUM 8.8* 7.8* 8.1* 8.6* 8.6*  MG  --   --  1.4* 1.5* 2.0   GFR: Estimated Creatinine Clearance: 67 mL/min (by C-G formula based on SCr of 0.5 mg/dL). Liver Function Tests: Recent Labs  Lab 10/12/18 2105 10/14/18 0525 10/15/18 0500 10/16/18 0500 10/17/18 0430  AST 26 26 28  42* 50*  ALT 25 23 24 31  38  ALKPHOS 85 66 73 77 77  BILITOT 0.5 0.5 0.7 0.7 0.6  PROT 7.6 6.4* 7.0 7.4 7.4  ALBUMIN 3.9 3.1* 3.2* 3.3* 3.4*   No results for input(s): LIPASE, AMYLASE in the last 168 hours. No results for input(s): AMMONIA in the last 168 hours. Coagulation Profile: Recent Labs  Lab 10/16/18 0500 10/17/18 0430  INR 1.0 1.0   Cardiac Enzymes: Recent Labs  Lab 10/12/18 2105  TROPONINI <0.03   BNP (last 3 results) No results for input(s): PROBNP in the last 8760 hours. HbA1C: No results for input(s): HGBA1C in the last 72 hours. CBG: Recent Labs  Lab 10/16/18 0810 10/16/18 1156 10/16/18 1652 10/16/18 2146  10/17/18 0900  GLUCAP 152* 143* 182* 155* 161*   Lipid Profile: No results for input(s): CHOL, HDL, LDLCALC, TRIG, CHOLHDL, LDLDIRECT in the last 72 hours. Thyroid Function Tests: No results for input(s): TSH, T4TOTAL, FREET4, T3FREE, THYROIDAB in the last 72 hours. Anemia Panel: Recent Labs    10/16/18 0500 10/17/18 0430  FERRITIN 916* 1,468*   Urine analysis:    Component Value Date/Time   COLORURINE YELLOW 06/17/2018 1305   APPEARANCEUR CLEAR 06/17/2018 1305   LABSPEC 1.003 (L) 06/17/2018 1305   PHURINE 7.0 06/17/2018 1305   GLUCOSEU NEGATIVE 06/17/2018 1305   HGBUR NEGATIVE 06/17/2018 1305   BILIRUBINUR negative 06/26/2018 1529   KETONESUR NEGATIVE 06/17/2018 1305   PROTEINUR Positive (A) 06/26/2018 1529   PROTEINUR NEGATIVE 06/17/2018 1305   UROBILINOGEN 0.2 06/26/2018 1529   UROBILINOGEN 1.0 06/17/2018 1151   NITRITE negative  06/26/2018 1529   NITRITE NEGATIVE 06/17/2018 1305   LEUKOCYTESUR Negative 06/26/2018 1529   Sepsis Labs: Invalid input(s): PROCALCITONIN, LACTICIDVEN  Recent Results (from the past 240 hour(s))  SARS Coronavirus 2 [facility] order, Performed in [facility] hospital lab)     Status: Abnormal   Collection Time: 10/12/18  9:05 PM  Result Value Ref Range Status   SARS Coronavirus 2 POSITIVE (A) NEGATIVE Final    Comment: RESULT CALLED TO, READ BACK BY AND VERIFIED WITH: Z,WRIGHT AT 2359 ON 10/12/18 BY A,MOHAMED (NOTE) If result is NEGATIVE SARS-CoV-2 target nucleic acids are NOT DETECTED. The SARS-CoV-2 RNA is generally detectable in upper and lower  respiratory specimens during the acute phase of infection. The lowest  concentration of SARS-CoV-2 viral copies this assay can detect is 250  copies / mL. A negative result does not preclude SARS-CoV-2 infection  and should not be used as the sole basis for treatment or other  patient management decisions.  A negative result may occur with  improper specimen collection / handling, submission of specimen other  than nasopharyngeal swab, presence of viral mutation(s) within the  areas targeted by this assay, and inadequate number of viral copies  (<250 copies / mL). A negative result must be combined with clinical  observations, patient history, and epidemiological information. If result is POSITIVE SARS-CoV-2 target nucleic acids are DETECTE D. The SARS-CoV-2 RNA is generally detectable in upper and lower  respiratory specimens during the acute phase of infection.  Positive  results are indicative of active infection with SARS-CoV-2.  Clinical  correlation with patient history and other diagnostic information is  necessary to determine patient infection status.  Positive results do  not rule out bacterial infection or co-infection with other viruses. If result is PRESUMPTIVE POSTIVE SARS-CoV-2 nucleic acids MAY BE PRESENT.   A  presumptive positive result was obtained on the submitted specimen  and confirmed on repeat testing.  While 2019 novel coronavirus  (SARS-CoV-2) nucleic acids may be present in the submitted sample  additional confirmatory testing may be necessary for epidemiological  and / or clinical management purposes  to differentiate between  SARS-CoV-2 and other Sarbecovirus currently known to infect humans.  If clinically indicated additional testing with an alternate test  methodology (LAB745 3) is advised. The SARS-CoV-2 RNA is generally  detectable in upper and lower respiratory specimens during the acute  phase of infection. The expected result is Negative. Fact Sheet for Patients:  BoilerBrush.com.cy Fact Sheet for Healthcare Providers: https://pope.com/ This test is not yet approved or cleared by the [place] FDA and has been authorized for detection and/or diagnosis of  SARS-CoV-2 by FDA under an Emergency Use Authorization (EUA).  This EUA will remain in effect (meaning this test can be used) for the duration of the COVID-19 declaration under Section 564(b)(1) of the Act, 21 U.S.C. section 360bbb-3(b)(1), unless the authorization is terminated or revoked sooner. Performed at [facility], [address], [place], [place] [address]   Blood Culture (routine x 2)     Status: None (Preliminary result)   Collection Time: 10/12/18  9:05 PM  Result Value Ref Range Status   Specimen Description   Final    BLOOD RIGHT HAND Performed at [facility], [address] [place], [place] [address]    Special Requests   Final    BOTTLES DRAWN AEROBIC AND ANAEROBIC Blood Culture results may not be optimal due to an inadequate volume of blood received in culture bottles Performed at [facility], [address], [place], [place] [address]    Culture   Final    NO GROWTH 4 DAYS Performed at  [facility], [address], [place], [place] [address]    Report Status PENDING  Incomplete  Blood Culture (routine x 2)     Status: None (Preliminary result)   Collection Time: 10/12/18  9:10 PM  Result Value Ref Range Status   Specimen Description   Final    BLOOD LEFT ANTECUBITAL Performed at [facility], [address] [place], [place] [address]    Special Requests   Final    BOTTLES DRAWN AEROBIC AND ANAEROBIC Blood Culture results may not be optimal due to an excessive volume of blood received in culture bottles Performed at [facility], [address], [place], [place] [address]    Culture   Final    NO GROWTH 4 DAYS Performed at [facility], [address] [place], [place] [address]    Report Status PENDING  Incomplete      Radiology Studies: No results found.   Huey Bienenstock MD [facility]  Contact via  [email or web address]  [facility] Office Info P: [phone]  F: [phone]

## 2018-10-17 NOTE — Plan of Care (Signed)
  Problem: Activity: Goal: Ability to tolerate increased activity will improve Outcome: Progressing   Problem: Clinical Measurements: Goal: Ability to maintain a body temperature in the normal range will improve Outcome: Progressing   Problem: Respiratory: Goal: Ability to maintain adequate ventilation will improve Outcome: Progressing Goal: Ability to maintain a clear airway will improve Outcome: Progressing   Problem: Education: Goal: Knowledge of General Education information will improve Description: Including pain rating scale, medication(s)/side effects and non-pharmacologic comfort measures Outcome: Progressing   Problem: Health Behavior/Discharge Planning: Goal: Ability to manage health-related needs will improve Outcome: Progressing   Problem: Clinical Measurements: Goal: Ability to maintain clinical measurements within normal limits will improve Outcome: Progressing Goal: Will remain free from infection Outcome: Progressing Goal: Diagnostic test results will improve Outcome: Progressing Goal: Respiratory complications will improve Outcome: Progressing Goal: Cardiovascular complication will be avoided Outcome: Progressing   Problem: Activity: Goal: Risk for activity intolerance will decrease Outcome: Progressing   Problem: Nutrition: Goal: Adequate nutrition will be maintained Outcome: Progressing   Problem: Coping: Goal: Level of anxiety will decrease Outcome: Progressing   Problem: Elimination: Goal: Will not experience complications related to bowel motility Outcome: Progressing Goal: Will not experience complications related to urinary retention Outcome: Progressing   Problem: Pain Managment: Goal: General experience of comfort will improve Outcome: Progressing   Problem: Safety: Goal: Ability to remain free from injury will improve Outcome: Progressing   Problem: Skin Integrity: Goal: Risk for impaired skin integrity will decrease Outcome:  Progressing   Problem: Education: Goal: Knowledge of risk factors and measures for prevention of condition will improve Outcome: Progressing   Problem: Coping: Goal: Psychosocial and spiritual needs will be supported Outcome: Progressing   Problem: Respiratory: Goal: Will maintain a patent airway Outcome: Progressing Goal: Complications related to the disease process, condition or treatment will be avoided or minimized Outcome: Progressing   

## 2018-10-18 LAB — COMPREHENSIVE METABOLIC PANEL
ALT: 39 U/L (ref 0–44)
AST: 53 U/L — ABNORMAL HIGH (ref 15–41)
Albumin: 3.2 g/dL — ABNORMAL LOW (ref 3.5–5.0)
Alkaline Phosphatase: 76 U/L (ref 38–126)
Anion gap: 11 (ref 5–15)
BUN: 16 mg/dL (ref 8–23)
CO2: 24 mmol/L (ref 22–32)
Calcium: 8.6 mg/dL — ABNORMAL LOW (ref 8.9–10.3)
Chloride: 98 mmol/L (ref 98–111)
Creatinine, Ser: 0.54 mg/dL (ref 0.44–1.00)
GFR calc Af Amer: >60 mL/min (ref >60)
GFR calc non Af Amer: >60 mL/min (ref >60)
Glucose, Bld: 183 mg/dL — ABNORMAL HIGH (ref 70–99)
Potassium: 3.7 mmol/L (ref 3.5–5.1)
Sodium: 133 mmol/L — ABNORMAL LOW (ref 135–145)
Total Bilirubin: 0.7 mg/dL (ref 0.3–1.2)
Total Protein: 7.1 g/dL (ref 6.5–8.1)

## 2018-10-18 LAB — CBC WITH DIFFERENTIAL/PLATELET
Abs Immature Granulocytes: 0.24 10*3/uL — ABNORMAL HIGH (ref 0.00–0.07)
Basophils Absolute: 0 10*3/uL (ref 0.0–0.1)
Basophils Relative: 1 %
Eosinophils Absolute: 0 10*3/uL (ref 0.0–0.5)
Eosinophils Relative: 1 %
HCT: 36 % (ref 36.0–46.0)
Hemoglobin: 11.7 g/dL — ABNORMAL LOW (ref 12.0–15.0)
Immature Granulocytes: 4 %
Lymphocytes Relative: 20 %
Lymphs Abs: 1.3 10*3/uL (ref 0.7–4.0)
MCH: 27.3 pg (ref 26.0–34.0)
MCHC: 32.5 g/dL (ref 30.0–36.0)
MCV: 83.9 fL (ref 80.0–100.0)
Monocytes Absolute: 0.2 10*3/uL (ref 0.1–1.0)
Monocytes Relative: 3 %
Neutro Abs: 4.7 10*3/uL (ref 1.7–7.7)
Neutrophils Relative %: 71 %
Platelets: 204 10*3/uL (ref 150–400)
RBC: 4.29 MIL/uL (ref 3.87–5.11)
RDW: 12.7 % (ref 11.5–15.5)
WBC: 6.5 10*3/uL (ref 4.0–10.5)
nRBC: 0 % (ref 0.0–0.2)

## 2018-10-18 LAB — C-REACTIVE PROTEIN: CRP: 1.1 mg/dL — ABNORMAL HIGH (ref <1.0)

## 2018-10-18 LAB — MAGNESIUM: Magnesium: 2.1 mg/dL (ref 1.7–2.4)

## 2018-10-18 LAB — D-DIMER, QUANTITATIVE: D-Dimer, Quant: 1.89 ug/mL-FEU — ABNORMAL HIGH (ref 0.00–0.50)

## 2018-10-18 LAB — CULTURE, BLOOD (ROUTINE X 2)
Culture: NO GROWTH
Culture: NO GROWTH

## 2018-10-18 LAB — PROTIME-INR
INR: 1 (ref 0.8–1.2)
Prothrombin Time: 13.1 seconds (ref 11.4–15.2)

## 2018-10-18 LAB — FIBRINOGEN: Fibrinogen: 762 mg/dL — ABNORMAL HIGH (ref 210–475)

## 2018-10-18 LAB — APTT: aPTT: 30 seconds (ref 24–36)

## 2018-10-18 LAB — GLUCOSE, CAPILLARY: Glucose-Capillary: 220 mg/dL — ABNORMAL HIGH (ref 70–99)

## 2018-10-18 LAB — FERRITIN: Ferritin: 1532 ng/mL — ABNORMAL HIGH (ref 11–307)

## 2018-10-18 MED ORDER — ZINC SULFATE 220 (50 ZN) MG PO CAPS: 220.0000 mg | ORAL_CAPSULE | Freq: Every day | ORAL | Status: DC

## 2018-10-18 MED ORDER — ATORVASTATIN CALCIUM 20 MG PO TABS: 20.0000 mg | ORAL_TABLET | Freq: Every day | ORAL | Status: DC

## 2018-10-18 MED ORDER — VITAMIN C 1000 MG PO TABS: 1000.0000 mg | ORAL_TABLET | Freq: Every day | ORAL | Status: DC

## 2018-10-18 MED ORDER — POTASSIUM CHLORIDE CRYS ER 20 MEQ PO TBCR
40.0000 meq | EXTENDED_RELEASE_TABLET | Freq: Once | ORAL | Status: AC
  Administered 2018-10-18: 14:00:00 40 meq via ORAL
  Filled 2018-10-18: qty 2

## 2018-10-18 NOTE — Plan of Care (Signed)
  Problem: Activity: Goal: Ability to tolerate increased activity will improve Outcome: Progressing   Problem: Clinical Measurements: Goal: Ability to maintain a body temperature in the normal range will improve Outcome: Progressing   Problem: Respiratory: Goal: Ability to maintain adequate ventilation will improve Outcome: Progressing Goal: Ability to maintain a clear airway will improve Outcome: Progressing   Problem: Education: Goal: Knowledge of General Education information will improve Description: Including pain rating scale, medication(s)/side effects and non-pharmacologic comfort measures Outcome: Progressing   Problem: Health Behavior/Discharge Planning: Goal: Ability to manage health-related needs will improve Outcome: Progressing   Problem: Clinical Measurements: Goal: Ability to maintain clinical measurements within normal limits will improve Outcome: Progressing Goal: Will remain free from infection Outcome: Progressing Goal: Diagnostic test results will improve Outcome: Progressing Goal: Respiratory complications will improve Outcome: Progressing Goal: Cardiovascular complication will be avoided Outcome: Progressing   Problem: Activity: Goal: Risk for activity intolerance will decrease Outcome: Progressing   Problem: Nutrition: Goal: Adequate nutrition will be maintained Outcome: Progressing   Problem: Coping: Goal: Level of anxiety will decrease Outcome: Progressing   Problem: Elimination: Goal: Will not experience complications related to bowel motility Outcome: Progressing Goal: Will not experience complications related to urinary retention Outcome: Progressing   Problem: Pain Managment: Goal: General experience of comfort will improve Outcome: Progressing   Problem: Safety: Goal: Ability to remain free from injury will improve Outcome: Progressing   Problem: Skin Integrity: Goal: Risk for impaired skin integrity will decrease Outcome:  Progressing   Problem: Education: Goal: Knowledge of risk factors and measures for prevention of condition will improve Outcome: Progressing   Problem: Coping: Goal: Psychosocial and spiritual needs will be supported Outcome: Progressing   Problem: Respiratory: Goal: Will maintain a patent airway Outcome: Progressing Goal: Complications related to the disease process, condition or treatment will be avoided or minimized Outcome: Progressing   

## 2018-10-18 NOTE — Plan of Care (Signed)
  Problem: Activity: Goal: Ability to tolerate increased activity will improve Outcome: Progressing   Problem: Clinical Measurements: Goal: Ability to maintain a body temperature in the normal range will improve Outcome: Progressing   Problem: Respiratory: Goal: Ability to maintain adequate ventilation will improve Outcome: Progressing Goal: Ability to maintain a clear airway will improve Outcome: Progressing   Problem: Education: Goal: Knowledge of General Education information will improve Description Including pain rating scale, medication(s)/side effects and non-pharmacologic comfort measures Outcome: Progressing   Problem: Health Behavior/Discharge Planning: Goal: Ability to manage health-related needs will improve Outcome: Progressing   Problem: Clinical Measurements: Goal: Ability to maintain clinical measurements within normal limits will improve Outcome: Progressing Goal: Will remain free from infection Outcome: Progressing Goal: Diagnostic test results will improve Outcome: Progressing Goal: Respiratory complications will improve Outcome: Progressing Goal: Cardiovascular complication will be avoided Outcome: Progressing   Problem: Nutrition: Goal: Adequate nutrition will be maintained Outcome: Progressing   Problem: Coping: Goal: Level of anxiety will decrease Outcome: Progressing   Problem: Elimination: Goal: Will not experience complications related to bowel motility Outcome: Progressing Goal: Will not experience complications related to urinary retention Outcome: Progressing   Problem: Pain Managment: Goal: General experience of comfort will improve Outcome: Progressing   Problem: Safety: Goal: Ability to remain free from injury will improve Outcome: Progressing   Problem: Skin Integrity: Goal: Risk for impaired skin integrity will decrease Outcome: Progressing   Problem: Education: Goal: Knowledge of risk factors and measures for prevention  of condition will improve Outcome: Progressing   Problem: Coping: Goal: Psychosocial and spiritual needs will be supported Outcome: Progressing   Problem: Respiratory: Goal: Will maintain a patent airway Outcome: Progressing Goal: Complications related to the disease process, condition or treatment will be avoided or minimized Outcome: Progressing

## 2018-10-18 NOTE — Plan of Care (Signed)
Problem: Activity: Goal: Ability to tolerate increased activity will improve 10/18/2018 1132 by Watt Climesucker, Trestan Vahle S, RN Outcome: Adequate for Discharge 10/18/2018 1012 by Watt Climesucker, Nicolo Tomko S, RN Outcome: Progressing   Problem: Clinical Measurements: Goal: Ability to maintain a body temperature in the normal range will improve 10/18/2018 1132 by Watt Climesucker, Nastassia Bazaldua S, RN Outcome: Adequate for Discharge 10/18/2018 1012 by Watt Climesucker, Ezabella Teska S, RN Outcome: Progressing   Problem: Respiratory: Goal: Ability to maintain adequate ventilation will improve 10/18/2018 1132 by Watt Climesucker, Colvin Blatt S, RN Outcome: Adequate for Discharge 10/18/2018 1012 by Watt Climesucker, Aleenah Homen S, RN Outcome: Progressing Goal: Ability to maintain a clear airway will improve 10/18/2018 1132 by Watt Climesucker, Richar Dunklee S, RN Outcome: Adequate for Discharge 10/18/2018 1012 by Watt Climesucker, Emmamae Mcnamara S, RN Outcome: Progressing   Problem: Education: Goal: Knowledge of General Education information will improve Description Including pain rating scale, medication(s)/side effects and non-pharmacologic comfort measures 10/18/2018 1132 by Watt Climesucker, Isella Slatten S, RN Outcome: Adequate for Discharge 10/18/2018 1012 by Watt Climesucker, Prabhav Faulkenberry S, RN Outcome: Progressing   Problem: Health Behavior/Discharge Planning: Goal: Ability to manage health-related needs will improve 10/18/2018 1132 by Watt Climesucker, Tytus Strahle S, RN Outcome: Adequate for Discharge 10/18/2018 1012 by Watt Climesucker, Zamyiah Tino S, RN Outcome: Progressing   Problem: Clinical Measurements: Goal: Ability to maintain clinical measurements within normal limits will improve 10/18/2018 1132 by Watt Climesucker, Annaleigh Steinmeyer S, RN Outcome: Adequate for Discharge 10/18/2018 1012 by Watt Climesucker, Bernadene Garside S, RN Outcome: Progressing Goal: Will remain free from infection 10/18/2018 1132 by Watt Climesucker, Chrys Landgrebe S, RN Outcome: Adequate for Discharge 10/18/2018 1012 by Watt Climesucker, Koda Defrank S, RN Outcome: Progressing Goal: Diagnostic test results will improve 10/18/2018 1132 by Watt Climesucker, Lewanda Perea S,  RN Outcome: Adequate for Discharge 10/18/2018 1012 by Watt Climesucker, Matina Rodier S, RN Outcome: Progressing Goal: Respiratory complications will improve 10/18/2018 1132 by Watt Climesucker, Brody Kump S, RN Outcome: Adequate for Discharge 10/18/2018 1012 by Watt Climesucker, Branden Vine S, RN Outcome: Progressing Goal: Cardiovascular complication will be avoided 10/18/2018 1132 by Watt Climesucker, Jull Harral S, RN Outcome: Adequate for Discharge 10/18/2018 1012 by Watt Climesucker, Jarissa Sheriff S, RN Outcome: Progressing   Problem: Activity: Goal: Risk for activity intolerance will decrease 10/18/2018 1132 by Watt Climesucker, Deseray Daponte S, RN Outcome: Adequate for Discharge 10/18/2018 1012 by Watt Climesucker, Mikhia Dusek S, RN Outcome: Progressing   Problem: Nutrition: Goal: Adequate nutrition will be maintained 10/18/2018 1132 by Watt Climesucker, Katee Wentland S, RN Outcome: Adequate for Discharge 10/18/2018 1012 by Watt Climesucker, Thomson Herbers S, RN Outcome: Progressing   Problem: Coping: Goal: Level of anxiety will decrease 10/18/2018 1132 by Watt Climesucker, Andrez Lieurance S, RN Outcome: Adequate for Discharge 10/18/2018 1012 by Watt Climesucker, Arleigh Dicola S, RN Outcome: Progressing   Problem: Elimination: Goal: Will not experience complications related to bowel motility 10/18/2018 1132 by Watt Climesucker, Ovie Cornelio S, RN Outcome: Adequate for Discharge 10/18/2018 1012 by Watt Climesucker, Shakeyla Giebler S, RN Outcome: Progressing Goal: Will not experience complications related to urinary retention 10/18/2018 1132 by Watt Climesucker, Ules Marsala S, RN Outcome: Adequate for Discharge 10/18/2018 1012 by Watt Climesucker, Auther Lyerly S, RN Outcome: Progressing   Problem: Pain Managment: Goal: General experience of comfort will improve 10/18/2018 1132 by Watt Climesucker, Tawnee Clegg S, RN Outcome: Adequate for Discharge 10/18/2018 1012 by Watt Climesucker, Tequilla Cousineau S, RN Outcome: Progressing   Problem: Safety: Goal: Ability to remain free from injury will improve 10/18/2018 1132 by Watt Climesucker, Delbert Vu S, RN Outcome: Adequate for Discharge 10/18/2018 1012 by Watt Climesucker, Mariene Dickerman S, RN Outcome: Progressing   Problem: Skin Integrity: Goal:  Risk for impaired skin integrity will decrease 10/18/2018 1132 by Watt Climesucker, Briannon Boggio S, RN Outcome: Adequate for Discharge 10/18/2018 1012 by Watt Climesucker, Laurie Penado S, RN Outcome: Progressing  Problem: Education: Goal: Knowledge of risk factors and measures for prevention of condition will improve 10/18/2018 1132 by Watt Climes, RN Outcome: Adequate for Discharge 10/18/2018 1012 by Watt Climes, RN Outcome: Progressing   Problem: Coping: Goal: Psychosocial and spiritual needs will be supported 10/18/2018 1132 by Watt Climes, RN Outcome: Adequate for Discharge 10/18/2018 1012 by Watt Climes, RN Outcome: Progressing   Problem: Respiratory: Goal: Will maintain a patent airway 10/18/2018 1132 by Watt Climes, RN Outcome: Adequate for Discharge 10/18/2018 1012 by Watt Climes, RN Outcome: Progressing Goal: Complications related to the disease process, condition or treatment will be avoided or minimized 10/18/2018 1132 by Watt Climes, RN Outcome: Adequate for Discharge 10/18/2018 1012 by Watt Climes, RN Outcome: Progressing

## 2018-10-18 NOTE — Progress Notes (Signed)
Discharge instructions and medications reviewed with patient.  Voiced understanding.  All questions answered.  IV removed intact.  Transported via w/c to exit and released to family.

## 2018-10-18 NOTE — Discharge Summary (Signed)
Shannon [name], is a 63 y.o. female  DOB 1955-09-20  MRN [id number].  Admission date:  10/12/2018  Admitting Physician  Ike Bene, MD  Discharge Date:  10/18/2018   Primary MD  Dorothyann Peng, MD  Recommendations for primary care physician for things to follow:  - please check CBC, CMP during next visit -Patient was given [facility] Department of Health infection prevention recommendation for COVID-19   Admission Diagnosis  Community acquired pneumonia of right upper lobe of lung (HCC) [J18.1] Suspected Covid-19 Virus Infection [R68.89] COVID-19 virus infection [U07.1]   Discharge Diagnosis  Community acquired pneumonia of right upper lobe of lung (HCC) [J18.1] Suspected Covid-19 Virus Infection [R68.89] COVID-19 virus infection [U07.1]    Active Problems:   Type 2 diabetes mellitus without complication, with long-term current use of insulin (HCC)   Essential hypertension   COVID-19 virus infection   Hypokalemia      Past Medical History:  Diagnosis Date  . Diabetes mellitus without complication (HCC)   . Hypercholesteremia   . Hypertension     Past Surgical History:  Procedure Laterality Date  . ABDOMINAL HYSTERECTOMY    . CESAREAN SECTION    . MYOMECTOMY         History of present illness and  Hospital Course:     Kindly see H&P for history of present illness and admission details, please review complete Labs, Consult reports and Test reports for all details in brief  HPI  from the history and physical done on the day of admission  HPI: Shannon [name] is a 63 y.o. female with hx of HTN, DMT2 on tresiba, HLD who presented after fever and malaise x 1 week. 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 Initially had sore throat and mild sinus drainage. Patient reports that she called  out from work on Tuesday and Wednesday and then when her symptoms improved, returned to work on Friday. However over the past 3 days, had progressive myalgias, malaise, subjective fevers, and loss of appetite. Reported new non productive dry cough x 3 days. Denies significant dyspnea. Overall feels very weak. Patient reports that one of her co-workers at the facility had recently completed 14-day home quarantine and returned to work this week; and she also reports that a facility resident had positive COVID earlier this month and was hospitalized at [facility]. Patient denies history of chronic respiratory illness or issues. Non-smoker.   Review of Systems:  + malaise, subjective fevers, generalized weakness + dry cough with associated chest tightness + mild dyspnea  - no edema, PND, orthopnea - no nausea/vomiting; no tarry, melanotic or bloody stools - no dysuria, increased urinary frequency - no weight changes Rest of systems reviewed are negative, except as per above history.   ED course:  Vitals Blood pressure (!) 153/72, pulse 93, temperature (!) 103 F (39.4 C), temperature source Oral, resp. rate (!) 22, height 5\' 3"  (1.6 m),  weight 70.3 kg, SpO2 99 %. Received tylenol  x 1; azithromycin IV  x 1; NS continuous at 125 cc/hr   Hospital Course   62 with a history of hypertension, type 2 diabetes mellitus on Tresiba, hyperlipidemia, who presented to the hospital and was admitted on 10/13/2018 after about7 daysof fever and malaise. 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 She was feeling badon Tuesday and called out from work on Tuesday and Wednesday, and when her symptoms improved returnedto work on Friday. Her symptoms progressed, she has had progressive myalgias, malaise, fevers and appetite loss, along with a nonproductive dry cough for 3 days and came to the hospital. Patient reports that one of her co-workers at the facility had recently completed 14-day home quarantine and  returned to work this week; and she also reports that a facility resident had positive COVID earlier this month and was hospitalized at [facility].  She was hospitalized on 10/12/2018 and transferred to [facility] on 4/21.  Acute Covid 19 Viral Illness - patient was treated with hydroxychloroquine, and azithromycin, finished total of 5 days, her QTC was monitored daily, as well magnesium and potassium were monitored closely and repleted to keep magnesium >2, and potassium >4. -Received Actemra 10/15/2018, given worsening inflammatory markers and lung opacity in chest x-ray.   she is a Research scientist (physical sciences) and has had vaccination for hep B and is regularly screened for TB per patient.  Her hepatitis B surface antibody was negative, and QuantiFERON is negative as well . - treated with IV Lasix 60 mg IV daily  during hospital stay, no Lasix required on discharge -Saturating 95 to 97% on room air this morning -Patient was instructed to continue with awake pruning at home, to monitor her oxygen saturation closely via pulse ox, and take Tylenol for fever and avoid NSAIDs  Thrombocytopenia -Most likely in the setting of COVID-19 infection, resolved by time of discharge  Hypokalemia -repleted  Mild hyponatremia -Appears euvolemic, monitor closley  Type 2 diabetes mellitus -Resume home meds  Hypertension -Resume home meds  Hyperlipidemia -Dr. to hold atorvastatin for 2 weeks on discharge given mildly elevated AST at 53.   Discharge Condition:  stable  Follow UP  Follow-up Information    Dorothyann Peng, MD Follow up in 10 day(s).   Specialty:  Internal Medicine Contact information: [address] [place] [address] [phone]             Discharge Instructions  and  Discharge Medications    Discharge Instructions    Discharge instructions   Complete by:  As directed    Follow with Primary MD Dorothyann Peng, MD in 10 days   Get CBC, CMP,  checked  by  Primary MD next visit.    Activity: As tolerated with Full fall precautions use walker/cane & assistance as needed   Disposition Home    Diet: Heart Healthy ,Carb modified , with feeding assistance and aspiration precautions.  For Heart failure patients - Check your Weight same time everyday, if you gain over 2 pounds, or you develop in leg swelling, experience more shortness of breath or chest pain, call your Primary MD immediately. Follow Cardiac Low Salt Diet and 1.5 lit/day fluid restriction.   On your next visit with your primary care physician please Get Medicines reviewed and adjusted.   Please request your Prim.MD to go over all Hospital Tests and Procedure/Radiological results at the follow up, please get all Hospital records sent to your  Prim MD by signing hospital release before you go home.   If you experience worsening of your admission symptoms, develop shortness of breath, life threatening emergency, suicidal or homicidal thoughts you must seek medical attention immediately by calling 911 or calling your MD immediately  if symptoms less severe.  You Must read complete instructions/literature along with all the possible adverse reactions/side effects for all the Medicines you take and that have been prescribed to you. Take any new Medicines after you have completely understood and accpet all the possible adverse reactions/side effects.   Do not drive, operating heavy machinery, perform activities at heights, swimming or participation in water activities or provide baby sitting services if your were admitted for syncope or siezures until you have seen by Primary MD or a Neurologist and advised to do so again.  Do not drive when taking Pain medications.    Do not take more than prescribed Pain, Sleep and Anxiety Medications  Special Instructions: If you have smoked or chewed Tobacco  in the last 2 yrs please stop smoking, stop any regular Alcohol  and or any Recreational  drug use.  Wear Seat belts while driving.   Please note  You were cared for by a hospitalist during your hospital stay. If you have any questions about your discharge medications or the care you received while you were in the hospital after you are discharged, you can call the unit and asked to speak with the hospitalist on call if the hospitalist that took care of you is not available. Once you are discharged, your primary care physician will handle any further medical issues. Please note that NO REFILLS for any discharge medications will be authorized once you are discharged, as it is imperative that you return to your primary care physician (or establish a relationship with a primary care physician if you do not have one) for your aftercare needs so that they can reassess your need for medications and monitor your lab values.   Increase activity slowly   Complete by:  As directed    MyChart COVID-19 home monitoring program   Complete by:  Oct 18, 2018    Is the patient willing to use a smartphone for remote monitoring via the MyChart app?:  Yes   Temperature monitoring   Complete by:  Oct 18, 2018    After how many days would you like to receive a notification of this patient's flowsheet entries?:  1     Allergies as of 10/18/2018   No Known Allergies     Medication List    STOP taking these medications   methocarbamol 500 MG tablet Commonly known as:  ROBAXIN   polyethylene glycol 17 g packet Commonly known as:  MIRALAX / GLYCOLAX     TAKE these medications   acetaminophen 500 MG tablet Commonly known as:  TYLENOL Take 500-1,000 mg by mouth every 6 (six) hours as needed for headache (or pain).   atorvastatin 20 MG tablet Commonly known as:  LIPITOR Take 1 tablet (20 mg total) by mouth daily. Hold for next 2 weeks What changed:  additional instructions   lisinopril 10 MG tablet Commonly known as:  ZESTRIL Take 10 mg by mouth daily.   metFORMIN 500 MG tablet Commonly  known as:  GLUCOPHAGE Take 500 mg by mouth daily with breakfast.   Semaglutide(0.25 or 0.5MG /DOS) 2 MG/1.5ML Sopn Commonly known as:  Ozempic (0.25 or 0.5 MG/DOSE) Inject 0.25 mg into the skin once a week.  Evaristo Bury FlexTouch 100 UNIT/ML Sopn FlexTouch Pen Generic drug:  insulin degludec INJECT 21 UNITS SUBCUTANEOUSLY AS PER INSULIN PROTOCOL What changed:  See the new instructions.   vitamin C 1000 MG tablet Take 1 tablet (1,000 mg total) by mouth daily. Please take for 2 weeks.   VITAMIN D-3 PO Take 1 capsule by mouth daily.   zinc sulfate 220 (50 Zn) MG capsule Commonly known as:  Zinc-220 Take 1 capsule (220 mg total) by mouth daily. Please take for 2 weeks         Diet and Activity recommendation: See Discharge Instructions above   Consults obtained -  none   Major procedures and Radiology Reports - PLEASE review detailed and final reports for all details, in brief -     Dg Chest Port 1 View  Result Date: 10/15/2018 CLINICAL DATA:  Dyspnea EXAM: PORTABLE CHEST 1 VIEW COMPARISON:  10/12/2018 FINDINGS: Cardiac shadow is at the upper limits of normal in size but stable. The left lung is clear. Increasing peripheral infiltrates are noted on the right consistent with the given clinical history of COVID-19. No sizable effusion is noted. No bony abnormality is seen. IMPRESSION: Increasing peripheral infiltrates throughout the right lung consistent with the patient's given clinical history. Electronically Signed   By: Alcide Clever M.D.   On: 10/15/2018 08:17   Dg Chest Portable 1 View  Result Date: 10/12/2018 CLINICAL DATA:  Cough. EXAM: PORTABLE CHEST 1 VIEW COMPARISON:  None. FINDINGS: Heart is normal size. Left lung is clear. Peripheral opacities noted in the right upper lobe. No effusions or acute bony abnormality. IMPRESSION: Peripheral airspace opacities in the right upper lobe concerning for pneumonia. Atypical/viral infection possible. Electronically Signed   By: Charlett Nose M.D.   On: 10/12/2018 20:29    Micro Results    Recent Results (from the past 240 hour(s))  SARS Coronavirus 2 [facility] order, Performed in [facility] hospital lab)     Status: Abnormal   Collection Time: 10/12/18  9:05 PM  Result Value Ref Range Status   SARS Coronavirus 2 POSITIVE (A) NEGATIVE Final    Comment: RESULT CALLED TO, READ BACK BY AND VERIFIED WITH: Z,WRIGHT AT 2359 ON 10/12/18 BY A,MOHAMED (NOTE) If result is NEGATIVE SARS-CoV-2 target nucleic acids are NOT DETECTED. The SARS-CoV-2 RNA is generally detectable in upper and lower  respiratory specimens during the acute phase of infection. The lowest  concentration of SARS-CoV-2 viral copies this assay can detect is 250  copies / mL. A negative result does not preclude SARS-CoV-2 infection  and should not be used as the sole basis for treatment or other  patient management decisions.  A negative result may occur with  improper specimen collection / handling, submission of specimen other  than nasopharyngeal swab, presence of viral mutation(s) within the  areas targeted by this assay, and inadequate number of viral copies  (<250 copies / mL). A negative result must be combined with clinical  observations, patient history, and epidemiological information. If result is POSITIVE SARS-CoV-2 target nucleic acids are DETECTE D. The SARS-CoV-2 RNA is generally detectable in upper and lower  respiratory specimens during the acute phase of infection.  Positive  results are indicative of active infection with SARS-CoV-2.  Clinical  correlation with patient history and other diagnostic information is  necessary to determine patient infection status.  Positive results do  not rule out bacterial infection or co-infection with other viruses. If result is PRESUMPTIVE POSTIVE SARS-CoV-2 nucleic acids MAY BE  PRESENT.   A presumptive positive result was obtained on the submitted specimen  and confirmed on repeat testing.  While  2019 novel coronavirus  (SARS-CoV-2) nucleic acids may be present in the submitted sample  additional confirmatory testing may be necessary for epidemiological  and / or clinical management purposes  to differentiate between  SARS-CoV-2 and other Sarbecovirus currently known to infect humans.  If clinically indicated additional testing with an alternate test  methodology (LAB745 3) is advised. The SARS-CoV-2 RNA is generally  detectable in upper and lower respiratory specimens during the acute  phase of infection. The expected result is Negative. Fact Sheet for Patients:  BoilerBrush.com.cy Fact Sheet for Healthcare Providers: https://pope.com/ This test is not yet approved or cleared by the [place] FDA and has been authorized for detection and/or diagnosis of SARS-CoV-2 by FDA under an Emergency Use Authorization (EUA).  This EUA will remain in effect (meaning this test can be used) for the duration of the COVID-19 declaration under Section 564(b)(1) of the Act, 21 U.S.C. section 360bbb-3(b)(1), unless the authorization is terminated or revoked sooner. Performed at [facility], [address] [place], [place] [address]   Blood Culture (routine x 2)     Status: None (Preliminary result)   Collection Time: 10/12/18  9:05 PM  Result Value Ref Range Status   Specimen Description   Final    BLOOD RIGHT HAND Performed at [facility], [address] [place], [place] [address]    Special Requests   Final    BOTTLES DRAWN AEROBIC AND ANAEROBIC Blood Culture results may not be optimal due to an inadequate volume of blood received in culture bottles Performed at [facility], [address], [place], [place] [address]    Culture   Final    NO GROWTH 4 DAYS Performed at [facility], [address] [place], [place] [address]    Report Status PENDING  Incomplete  Blood  Culture (routine x 2)     Status: None (Preliminary result)   Collection Time: 10/12/18  9:10 PM  Result Value Ref Range Status   Specimen Description   Final    BLOOD LEFT ANTECUBITAL Performed at [facility], [address], [place], [place] [address]    Special Requests   Final    BOTTLES DRAWN AEROBIC AND ANAEROBIC Blood Culture results may not be optimal due to an excessive volume of blood received in culture bottles Performed at [facility], [address], [place], [place] [address]    Culture   Final    NO GROWTH 4 DAYS Performed at [facility], [address], [place], [place] [address]    Report Status PENDING  Incomplete       Today   Subjective:   Shannon [name] today has no headache,no chest or abdominal pain,no new weakness tingling or numbness, feels much better wants to go home today.   Objective:   Blood pressure 111/63, pulse 78, temperature 99.2 F (37.3 C), temperature source Oral, resp. rate (!) 31, height  (1.6 m), weight 70.7 kg, SpO2 93 %.   Intake/Output Summary (Last 24 hours) at 10/18/2018 1035 Last data filed at 10/18/2018 0600 Gross per 24 hour  Intake 360 ml  Output -  Net 360 ml    Exam Awake Alert, Oriented x 3, No new F.N deficits, Normal affect Symmetrical Chest wall movement, Good air movement bilaterally, CTAB RRR,No Gallops,Rubs  or new Murmurs, No Parasternal Heave +ve B.Sounds, Abd Soft, Non tender, No rebound -guarding or rigidity. No Cyanosis, Clubbing or edema, No new Rash or bruise  Data Review   CBC w Diff:  Lab Results  Component Value Date   WBC 6.5 10/18/2018   HGB 11.7 (L) 10/18/2018   HCT 36.0 10/18/2018   PLT 204 10/18/2018   LYMPHOPCT 20 10/18/2018   MONOPCT 3 10/18/2018   EOSPCT 1 10/18/2018   BASOPCT 1 10/18/2018    CMP:  Lab Results  Component Value Date   NA 133 (L) 10/18/2018   NA 138 08/29/2018   K 3.7 10/18/2018   CL 98 10/18/2018   CO2 24  10/18/2018   BUN 16 10/18/2018   BUN 15 08/29/2018   CREATININE 0.54 10/18/2018   PROT 7.1 10/18/2018   PROT 7.3 08/29/2018   ALBUMIN 3.2 (L) 10/18/2018   ALBUMIN 4.6 08/29/2018   BILITOT 0.7 10/18/2018   BILITOT <0.2 08/29/2018   ALKPHOS 76 10/18/2018   AST 53 (H) 10/18/2018   ALT 39 10/18/2018  .   Total Time in preparing paper work, data evaluation and todays exam - 35 minutes  Huey Bienenstockawood Mandela Bello M.D on 10/18/2018 at 10:35 AM  [facility]   Office  [phone]

## 2018-10-18 NOTE — Discharge Instructions (Signed)
-Please prone yourself up to 16 hours every day -Monitor your pulse ox intermittently, call ED if saturation is less than 90%. - continue to use Incentive spirometry.     Person Under Monitoring Name: Shannon [name]  Location: [address] [place] [address]   Infection Prevention Recommendations for Individuals Confirmed to have, or Being Evaluated for, 2019 Novel Coronavirus (COVID-19) Infection Who Receive Care at Home  Individuals who are confirmed to have, or are being evaluated for, COVID-19 should follow the prevention steps below until a healthcare provider or local or state health department says they can return to normal activities.  Stay home except to get medical care You should restrict activities outside your home, except for getting medical care. Do not go to work, school, or public areas, and do not use public transportation or taxis.  Call ahead before visiting your doctor Before your medical appointment, call the healthcare provider and tell them that you have, or are being evaluated for, COVID-19 infection. This will help the healthcare providers office take steps to keep other people from getting infected. Ask your healthcare provider to call the local or state health department.  Monitor your symptoms Seek prompt medical attention if your illness is worsening (e.g., difficulty breathing). Before going to your medical appointment, call the healthcare provider and tell them that you have, or are being evaluated for, COVID-19 infection. Ask your healthcare provider to call the local or state health department.  Wear a facemask You should wear a facemask that covers your nose and mouth when you are in the same room with other people and when you visit a healthcare provider. People who live with or visit you should also wear a facemask while they are in the same room with you.  Separate yourself from other people in your home As much as possible, you should  stay in a different room from other people in your home. Also, you should use a separate bathroom, if available.  Avoid sharing household items You should not share dishes, drinking glasses, cups, eating utensils, towels, bedding, or other items with other people in your home. After using these items, you should wash them thoroughly with soap and water.  Cover your coughs and sneezes Cover your mouth and nose with a tissue when you cough or sneeze, or you can cough or sneeze into your sleeve. Throw used tissues in a lined trash can, and immediately wash your hands with soap and water for at least 20 seconds or use an alcohol-based hand rub.  Wash your [employer] your hands often and thoroughly with soap and water for at least 20 seconds. You can use an alcohol-based hand sanitizer if soap and water are not available and if your hands are not visibly dirty. Avoid touching your eyes, nose, and mouth with unwashed hands.   Prevention Steps for Caregivers and Household Members of Individuals Confirmed to have, or Being Evaluated for, COVID-19 Infection Being Cared for in the Home  If you live with, or provide care at home for, a person confirmed to have, or being evaluated for, COVID-19 infection please follow these guidelines to prevent infection:  Follow healthcare providers instructions Make sure that you understand and can help the patient follow any healthcare provider instructions for all care.  Provide for the patients basic needs You should help the patient with basic needs in the home and provide support for getting groceries, prescriptions, and other personal needs.  Monitor the patients symptoms If they  are getting sicker, call his or her medical provider and tell them that the patient has, or is being evaluated for, COVID-19 infection. This will help the healthcare providers office take steps to keep other people from getting infected. Ask the healthcare provider to call  the local or state health department.  Limit the number of people who have contact with the patient  If possible, have only one caregiver for the patient.  Other household members should stay in another home or place of residence. If this is not possible, they should stay  in another room, or be separated from the patient as much as possible. Use a separate bathroom, if available.  Restrict visitors who do not have an essential need to be in the home.  Keep older adults, very young children, and other sick people away from the patient Keep older adults, very young children, and those who have compromised immune systems or chronic health conditions away from the patient. This includes people with chronic heart, lung, or kidney conditions, diabetes, and cancer.  Ensure good ventilation Make sure that shared spaces in the home have good air flow, such as from an air conditioner or an opened window, weather permitting.  Wash your hands often  Wash your hands often and thoroughly with soap and water for at least 20 seconds. You can use an alcohol based hand sanitizer if soap and water are not available and if your hands are not visibly dirty.  Avoid touching your eyes, nose, and mouth with unwashed hands.  Use disposable paper towels to dry your hands. If not available, use dedicated cloth towels and replace them when they become wet.  Wear a facemask and gloves  Wear a disposable facemask at all times in the room and gloves when you touch or have contact with the patients blood, body fluids, and/or secretions or excretions, such as sweat, saliva, sputum, nasal mucus, vomit, urine, or feces.  Ensure the mask fits over your nose and mouth tightly, and do not touch it during use.  Throw out disposable facemasks and gloves after using them. Do not reuse.  Wash your hands immediately after removing your facemask and gloves.  If your personal clothing becomes contaminated, carefully remove  clothing and launder. Wash your hands after handling contaminated clothing.  Place all used disposable facemasks, gloves, and other waste in a lined container before disposing them with other household waste.  Remove gloves and wash your hands immediately after handling these items.  Do not share dishes, glasses, or other household items with the patient  Avoid sharing household items. You should not share dishes, drinking glasses, cups, eating utensils, towels, bedding, or other items with a patient who is confirmed to have, or being evaluated for, COVID-19 infection.  After the person uses these items, you should wash them thoroughly with soap and water.  Wash laundry thoroughly  Immediately remove and wash clothes or bedding that have blood, body fluids, and/or secretions or excretions, such as sweat, saliva, sputum, nasal mucus, vomit, urine, or feces, on them.  Wear gloves when handling laundry from the patient.  Read and follow directions on labels of laundry or clothing items and detergent. In general, wash and dry with the warmest temperatures recommended on the label.  Clean all areas the individual has used often  Clean all touchable surfaces, such as counters, tabletops, doorknobs, bathroom fixtures, toilets, phones, keyboards, tablets, and bedside tables, every day. Also, clean any surfaces that may have blood, body fluids, and/or  secretions or excretions on them.  Wear gloves when cleaning surfaces the patient has come in contact with.  Use a diluted bleach solution (e.g., dilute bleach with 1 part bleach and 10 parts water) or a household disinfectant with a label that says EPA-registered for coronaviruses. To make a bleach solution at home, add 1 tablespoon of bleach to 1 quart (4 cups) of water. For a larger supply, add  cup of bleach to 1 gallon (16 cups) of water.  Read labels of cleaning products and follow recommendations provided on product labels. Labels contain  instructions for safe and effective use of the cleaning product including precautions you should take when applying the product, such as wearing gloves or eye protection and making sure you have good ventilation during use of the product.  Remove gloves and wash hands immediately after cleaning.  Monitor yourself for signs and symptoms of illness Caregivers and household members are considered close contacts, should monitor their health, and will be asked to limit movement outside of the home to the extent possible. Follow the monitoring steps for close contacts listed on the symptom monitoring form.   ? If you have additional questions, contact your local health department or call the epidemiologist on call at [phone] (available 24/7). ? This guidance is subject to change. For the most up-to-date guidance from [facility], please refer to their website: TripMetro.hu                        Person Under Monitoring Name: Shannon [name]  Location: [address] [place] [address]   CORONAVIRUS DISEASE 2019 (COVID-19) Guidance for Persons Under Investigation You are being tested for the virus that causes coronavirus disease 2019 (COVID-19). Public health actions are necessary to ensure protection of your health and the health of others, and to prevent further spread of infection. COVID-19 is caused by a virus that can cause symptoms, such as fever, cough, and shortness of breath. The primary transmission from person to person is by coughing or sneezing. On July 24, 2018, the World Health Organization announced a [employer] Emergency of International Concern and on July 25, 2018 the U.S. Department of Health and Human Services declared a public health emergency. If the virus that causesCOVID-19 spreads in the community, it could have severe public health consequences.  As a person under investigation for  COVID-19, the [employer] of Health and [employer], Division of [employer] advises you to adhere to the following guidance until your test results are reported to you. If your test result is positive, you will receive additional information from your provider and your local health department at that time.   Remain at home until you are cleared by your health provider or public health authorities.   Keep a log of visitors to your home using the form provided. Any visitors to your home must be aware of your isolation status.  If you plan to move to a new address or leave the county, notify the local health department in your county.  Call a doctor or seek care if you have an urgent medical need. Before seeking medical care, call ahead and get instructions from the provider before arriving at the medical office, clinic or hospital. Notify them that you are being tested for the virus that causes COVID-19 so arrangements can be made, as necessary, to prevent transmission to others in the healthcare setting. Next, notify the local health department in your county.  If a medical emergency arises and you need to call 911, inform the first responders that you are being tested for the virus that causes COVID-19. Next, notify the local health department in your county.  Adhere to all guidance set forth by the [facility] [employer] for [facility] of patients that is based on guidance from the Center for Disease Control and Prevention with suspected or confirmed COVID-19. It is provided with this guidance for Persons Under Investigation.  Your health and the health of our community are our top priorities. Public Health officials remain available to provide assistance and counseling to you about COVID-19 and compliance with this guidance.  Provider: ____________________________________________________________ Date: ______/_____/_________  By signing below, you acknowledge  that you have read and agree to comply with this Guidance for Persons Under Investigation. ______________________________________________________________ Date: ______/_____/_________  WHO DO I CALL? You can find a list of local health departments here: http://dean.org/https://www.ncdhhs.gov/divisions/public-health/county-healthdepartments Health Department: ____________________________________________________________________ Contact Name: ________________________________________________________________________ Telephone: ___________________________________________________________________________  Nedra HaiNorth  DHHS, Division of Public Health, Communicable Disease Branch COVID-19 Guidance for Persons Under Investigation August 30, 2018

## 2018-10-18 NOTE — TOC Transition Note (Signed)
Transition of Care [facility]) - CM/SW Discharge Note   Patient Details  Name: Shannon [name] MRN: [id number] Date of Birth: April 10, 1956  [facility] CM/SW Contact:  Colleen Can RN, BSN, NCM-BC, ACM-RN [phone] (working remotely) Phone Number: 10/18/2018, 11:43 AM   Clinical Narrative:    CM spoke to the patient via phone to discuss any dispositional needs. Patient states that she lives at home alone, but feels she will be "ok". Patient indicated that her son will provide transportation home. Patient confirmed having a PCP and will f/u post transition. No further needs from CM.    Final next level of care: Home/Self Care Barriers to Discharge: No Barriers Identified   Patient Goals and CMS Choice Patient states their goals for this hospitalization and ongoing recovery are:: To get better.   Choice offered to / list presented to : NA

## 2018-10-20 ENCOUNTER — Telehealth: Payer: Self-pay

## 2018-10-20 NOTE — Telephone Encounter (Signed)
PT STATES THAT SHE IS FEELING BETTER. ONLY SYMPTOM SHE IS HAVING IS WEAKNESS. BREATHING IS FINE.

## 2018-10-20 NOTE — Telephone Encounter (Signed)
PT GIVES PERMISSION TO DO VIRTUAL VISIT 

## 2018-10-21 ENCOUNTER — Encounter (INDEPENDENT_AMBULATORY_CARE_PROVIDER_SITE_OTHER): Payer: Self-pay

## 2018-10-22 ENCOUNTER — Other Ambulatory Visit: Payer: Self-pay

## 2018-10-22 ENCOUNTER — Encounter (INDEPENDENT_AMBULATORY_CARE_PROVIDER_SITE_OTHER): Payer: Self-pay

## 2018-10-22 ENCOUNTER — Ambulatory Visit (INDEPENDENT_AMBULATORY_CARE_PROVIDER_SITE_OTHER): Payer: PRIVATE HEALTH INSURANCE | Admitting: Nurse Practitioner

## 2018-10-22 DIAGNOSIS — R059 Cough, unspecified: Secondary | ICD-10-CM

## 2018-10-22 DIAGNOSIS — R05 Cough: Secondary | ICD-10-CM | POA: Diagnosis not present

## 2018-10-22 DIAGNOSIS — R5383 Other fatigue: Secondary | ICD-10-CM

## 2018-10-22 NOTE — Progress Notes (Addendum)
Virtual Visit via Video ([email or web address].me)      This visit type was conducted due to national recommendations for restrictions regarding the COVID-19 Pandemic (e.g. social distancing) in an effort to limit this patient's exposure and mitigate transmission in our community.  Patients identity confirmed using two different identifiers.  This format is felt to be most appropriate for this patient at this time.  All issues noted in this document were discussed and addressed.  No physical exam was performed (except for noted visual exam findings with Video Visits).    Date:  11/17/2018   ID:  Shannon, [name] 1955-10-19, MRN [id number]  Patient Location:  Home - [place]  Provider location:   Office    Chief Complaint:  Follow up hospital admission COVID-19  History of Present Illness:    Shannon [name] is a 63 y.o. female who presents via video conferencing for a telehealth visit today.    The patient does have symptoms concerning for COVID-19 infection (fever, chills, cough, or new shortness of breath).   Hospital follow up - she was admitted from 4/19-4/25 to the hospital after having general malaise for about 7 days. 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  She was then diagnosed with acute viral illness related COVID-19. Since being home she continues to be extremely fatigued.  She was treated with azithromycin and plaquenil.  She was also given lasix IV.      Past Medical History:  Diagnosis Date  . Diabetes mellitus without complication (HCC)   . Hypercholesteremia   . Hypertension    Past Surgical History:  Procedure Laterality Date  . ABDOMINAL HYSTERECTOMY    . CESAREAN SECTION    . MYOMECTOMY       Current Meds  Medication Sig  . acetaminophen (TYLENOL) 500 MG tablet Take 500-1,000 mg by mouth every 6  (six) hours as needed for headache (or pain).  . Ascorbic Acid (VITAMIN C) 1000 MG tablet Take 1 tablet (1,000 mg total) by mouth daily. Please take for 2 weeks.  Marland Kitchen atorvastatin (LIPITOR) 20 MG tablet Take 1 tablet (20 mg total) by mouth daily. Hold for next 2 weeks  . Cholecalciferol (VITAMIN D-3 PO) Take 1 capsule by mouth daily.  . metFORMIN (GLUCOPHAGE) 500 MG tablet Take 500 mg by mouth daily with breakfast.   . Semaglutide,0.25 or 0.5MG /DOS, (OZEMPIC, 0.25 OR 0.5 MG/DOSE,) 2 MG/1.5ML SOPN Inject 0.25 mg into the skin once a week.  . TRESIBA FLEXTOUCH 100 UNIT/ML SOPN FlexTouch Pen INJECT 21 UNITS SUBCUTANEOUSLY AS PER INSULIN PROTOCOL (Patient taking differently: Inject 21 Units into the skin daily after breakfast. )  . zinc sulfate (ZINC-220) 220 (50 Zn) MG capsule Take 1 capsule (220 mg total) by mouth daily. Please take for 2 weeks  . [DISCONTINUED] lisinopril (PRINIVIL,ZESTRIL) 10 MG tablet Take 10 mg by mouth daily.     Allergies:   Patient has no known allergies.   Social History   Tobacco Use  . Smoking status: Former Games developer  . Smokeless tobacco: Never Used  . Tobacco comment: quit 10 yrs  Substance Use Topics  . Alcohol use: Yes    Comment: rarely  . Drug use: Never     Family Hx: The patient's family history includes Diabetes in her father, mother, and son; Hypertension in her father,  mother, and son. There is no history of Breast cancer.  ROS:   Please see the history of present illness.    ROS  All other systems reviewed and are negative.   Labs/Other Tests and Data Reviewed:    Recent Labs: 08/29/2018: TSH 0.797 10/12/2018: B Natriuretic Peptide 19.7 10/18/2018: ALT 39; BUN 16; Creatinine, Ser 0.54; Hemoglobin 11.7; Magnesium 2.1; Platelets 204; Potassium 3.7; Sodium 133   Recent Lipid Panel Lab Results  Component Value Date/Time   CHOL 210 (H) 08/29/2018 10:37 AM   TRIG 205 (H) 10/12/2018 09:05 PM   HDL 51 08/29/2018 10:37 AM   CHOLHDL 4.1 08/29/2018  10:37 AM   CHOLHDL 5.6 09/20/2008 01:30 AM   LDLCALC 89 08/29/2018 10:37 AM    Wt Readings from Last 3 Encounters:  10/13/18 155 lb 13.8 oz (70.7 kg)  08/29/18 162 lb 12.8 oz (73.8 kg)  06/26/18 151 lb 9.6 oz (68.8 kg)     Exam:    Vital Signs:  There were no vitals taken for this visit.    Physical Exam  Constitutional: She is oriented to person, place, and time and well-developed, well-nourished, and in no distress.  She appears fatigued  Pulmonary/Chest:  Taking several deep breaths  Neurological: She is alert and oriented to person, place, and time.  Psychiatric: Mood, memory, affect and judgment normal.    ASSESSMENT & PLAN:     1. COVID-19 virus infection  Hospital follow up admitted 4/19-4/25 for COVID-19 viral illness  Continues to have an Intermittent cough but not as bad as initial diagnosis.  TCM Performed. A member of the clinical team spoke with the patient upon dischare. Discharge summary was reviewed in full detail during the visit. Meds reconciled and compared to discharge meds. Medication list is updated and reviewed with the patient.  Greater than 50% face to face time was spent in counseling an coordination of care.  All questions were answered to the satisfaction of the patient.    2. Other fatigue  Advised this will take time to improve  3. Cough  Dry cough is getting better  If worsens return call to office     COVID-19 Education: The signs and symptoms of COVID-19 were discussed with the patient and how to seek care for testing (follow up with PCP or arrange E-visit).  The importance of social distancing was discussed today.  Patient Risk:   After full review of this patients clinical status, I feel that they are at least moderate risk at this time.  Time:   Today, I have spent 25 minutes/ seconds with the patient with telehealth technology discussing above diagnoses.     Medication Adjustments/Labs and Tests Ordered: Current medicines  are reviewed at length with the patient today.  Concerns regarding medicines are outlined above.   Tests Ordered: No orders of the defined types were placed in this encounter.   Medication Changes: No orders of the defined types were placed in this encounter.   Disposition:  Follow up in PRN and 3 months for diabetes year(s)  Signed, Shannon FeltsJanece Gila Lauf, FNP

## 2018-10-23 ENCOUNTER — Telehealth: Payer: Self-pay

## 2018-10-23 NOTE — Telephone Encounter (Signed)
Called do check on pt and how she is doing. Left message no answer

## 2018-10-24 ENCOUNTER — Encounter (INDEPENDENT_AMBULATORY_CARE_PROVIDER_SITE_OTHER): Payer: Self-pay

## 2018-10-26 ENCOUNTER — Encounter (INDEPENDENT_AMBULATORY_CARE_PROVIDER_SITE_OTHER): Payer: Self-pay

## 2018-10-27 ENCOUNTER — Encounter (INDEPENDENT_AMBULATORY_CARE_PROVIDER_SITE_OTHER): Payer: Self-pay

## 2018-10-28 ENCOUNTER — Other Ambulatory Visit: Payer: Self-pay | Admitting: Nurse Practitioner

## 2018-10-28 ENCOUNTER — Encounter (INDEPENDENT_AMBULATORY_CARE_PROVIDER_SITE_OTHER): Payer: Self-pay

## 2018-10-28 ENCOUNTER — Other Ambulatory Visit: Payer: Self-pay | Admitting: Internal Medicine

## 2018-10-28 ENCOUNTER — Telehealth: Payer: Self-pay

## 2018-10-28 DIAGNOSIS — Z1231 Encounter for screening mammogram for malignant neoplasm of breast: Secondary | ICD-10-CM

## 2018-10-28 NOTE — Telephone Encounter (Signed)
Decreased shortness of breath. Still very weak. Cough is better. Pt encouraged to call the office if she needs anything.

## 2018-10-29 ENCOUNTER — Encounter (INDEPENDENT_AMBULATORY_CARE_PROVIDER_SITE_OTHER): Payer: Self-pay

## 2018-10-29 ENCOUNTER — Telehealth: Payer: Self-pay

## 2018-10-29 NOTE — Telephone Encounter (Signed)
Pt stated that she went back to her place of employment ([employer]) to be retested. She stated that it took a lot out of her and that she is extremely winded today. She repeatedly stated that she was not going to let this virus take her out. Pt encouraged to call the office with any concerns.

## 2018-10-30 ENCOUNTER — Telehealth: Payer: Self-pay

## 2018-10-30 ENCOUNTER — Encounter (INDEPENDENT_AMBULATORY_CARE_PROVIDER_SITE_OTHER): Payer: Self-pay

## 2018-10-30 NOTE — Telephone Encounter (Signed)
Spoke with pt to tell her about her appt changes and to see how she is doing. Pt was at the grocery store getting bread. She state that she is doing ok no cough just have trouble breathing and her weakness still gets the best of her. Encouraged to call the office if she needs anything.

## 2018-10-31 ENCOUNTER — Encounter (INDEPENDENT_AMBULATORY_CARE_PROVIDER_SITE_OTHER): Payer: Self-pay

## 2018-11-04 ENCOUNTER — Other Ambulatory Visit: Payer: Self-pay

## 2018-11-04 ENCOUNTER — Ambulatory Visit
Admission: RE | Admit: 2018-11-04 | Discharge: 2018-11-04 | Disposition: A | Discharge status: Home / Self Care | Payer: PRIVATE HEALTH INSURANCE | Source: Ambulatory Visit | Attending: Internal Medicine | Admitting: Internal Medicine

## 2018-11-04 ENCOUNTER — Telehealth: Payer: Self-pay

## 2018-11-04 DIAGNOSIS — Z1231 Encounter for screening mammogram for malignant neoplasm of breast: Secondary | ICD-10-CM

## 2018-11-04 NOTE — Telephone Encounter (Signed)
Called to check on pt. She is doing fine. Went for retest on today at her job. She also went to have a mammogram done today as well. States she is going back to work as soon as she gets results back.

## 2018-11-07 ENCOUNTER — Telehealth: Payer: Self-pay

## 2018-11-07 NOTE — Telephone Encounter (Signed)
Pt states that her covid 19 test came back negative. She has to have a 2nd negative before she returns to work. Pt is in good spirits and states that she is starting to feel like her normal self again. Encouraged pt to call the office for any needs.

## 2018-11-13 ENCOUNTER — Telehealth: Payer: Self-pay

## 2018-11-13 NOTE — Telephone Encounter (Signed)
Pts 2nd covid19 test came back inconclusive so she retested on Tuesday and should get results back later today. Pt encouraged to call the office if she needs anything

## 2018-11-17 ENCOUNTER — Encounter: Payer: Self-pay | Admitting: Nurse Practitioner

## 2018-11-18 ENCOUNTER — Encounter: Payer: Self-pay | Admitting: Nurse Practitioner

## 2018-11-20 ENCOUNTER — Telehealth: Payer: Self-pay | Admitting: *Deleted

## 2018-11-20 DIAGNOSIS — R5383 Other fatigue: Secondary | ICD-10-CM | POA: Insufficient documentation

## 2018-11-20 DIAGNOSIS — R05 Cough: Secondary | ICD-10-CM | POA: Insufficient documentation

## 2018-11-20 DIAGNOSIS — R059 Cough, unspecified: Secondary | ICD-10-CM | POA: Insufficient documentation

## 2018-11-20 NOTE — Telephone Encounter (Signed)
Called pt and gave her the information pertaining to donating plasma so her antibodies could be used to help other people with COVID-19 recover from the virus. She was willing to donate her plasma.  I gave her the [email or web address] web site with instructions on where to go on the web site for information and the donor registration link.   She is greater than 28 days out from symptoms.  I thanked her for her willingness to donate and let her know someone from [employer] will be contacting her regarding setting up a time for the donation. She thanked me for calling.

## 2018-11-25 ENCOUNTER — Encounter: Payer: Self-pay | Admitting: Nurse Practitioner

## 2018-11-26 ENCOUNTER — Telehealth: Payer: Self-pay

## 2018-11-26 NOTE — Telephone Encounter (Signed)
Patient notified her forms are ready to be picked up pt stated she will come today to get them I advised her to call me when she is outside and I will bring them to her. YRL,RMA

## 2018-12-03 ENCOUNTER — Encounter: Payer: PRIVATE HEALTH INSURANCE | Admitting: Nurse Practitioner

## 2018-12-05 ENCOUNTER — Encounter: Payer: PRIVATE HEALTH INSURANCE | Admitting: Nurse Practitioner

## 2018-12-09 ENCOUNTER — Encounter: Payer: PRIVATE HEALTH INSURANCE | Admitting: Nurse Practitioner

## 2018-12-11 ENCOUNTER — Other Ambulatory Visit: Payer: Self-pay | Admitting: Nurse Practitioner

## 2019-01-20 ENCOUNTER — Encounter: Payer: PRIVATE HEALTH INSURANCE | Admitting: Nurse Practitioner

## 2019-01-21 ENCOUNTER — Telehealth: Payer: Self-pay

## 2019-01-21 NOTE — Telephone Encounter (Signed)
LVM for pt to call to reschedule missed physical appt 01/21/2019

## 2019-06-01 ENCOUNTER — Encounter: Payer: Self-pay | Admitting: Nurse Practitioner

## 2019-06-01 ENCOUNTER — Ambulatory Visit: Payer: Self-pay | Admitting: Nurse Practitioner

## 2019-06-01 ENCOUNTER — Other Ambulatory Visit: Payer: Self-pay

## 2019-06-01 ENCOUNTER — Ambulatory Visit (INDEPENDENT_AMBULATORY_CARE_PROVIDER_SITE_OTHER): Payer: HMO | Admitting: Nurse Practitioner

## 2019-06-01 VITALS — BP 140/78 | HR 67 | Temp 97.7°F | Ht 62.4 in | Wt 167.6 lb

## 2019-06-01 DIAGNOSIS — E1165 Type 2 diabetes mellitus with hyperglycemia: Secondary | ICD-10-CM

## 2019-06-01 DIAGNOSIS — L02422 Furuncle of left axilla: Secondary | ICD-10-CM

## 2019-06-01 DIAGNOSIS — Z8616 Personal history of COVID-19: Secondary | ICD-10-CM

## 2019-06-01 DIAGNOSIS — I10 Essential (primary) hypertension: Secondary | ICD-10-CM | POA: Diagnosis not present

## 2019-06-01 DIAGNOSIS — Z8619 Personal history of other infectious and parasitic diseases: Secondary | ICD-10-CM

## 2019-06-01 DIAGNOSIS — E119 Type 2 diabetes mellitus without complications: Secondary | ICD-10-CM

## 2019-06-01 DIAGNOSIS — Z794 Long term (current) use of insulin: Secondary | ICD-10-CM

## 2019-06-01 DIAGNOSIS — L0292 Furuncle, unspecified: Secondary | ICD-10-CM

## 2019-06-01 DIAGNOSIS — L02421 Furuncle of right axilla: Secondary | ICD-10-CM

## 2019-06-01 DIAGNOSIS — Z1159 Encounter for screening for other viral diseases: Secondary | ICD-10-CM

## 2019-06-01 DIAGNOSIS — Z113 Encounter for screening for infections with a predominantly sexual mode of transmission: Secondary | ICD-10-CM

## 2019-06-01 MED ORDER — OZEMPIC (0.25 OR 0.5 MG/DOSE) 2 MG/1.5ML ~~LOC~~ SOPN: 0.5000 mg | PEN_INJECTOR | SUBCUTANEOUS | 1 refills | Status: DC

## 2019-06-01 MED ORDER — LISINOPRIL 10 MG PO TABS: 10.0000 mg | ORAL_TABLET | Freq: Every day | ORAL | 1 refills | Status: DC

## 2019-06-01 MED ORDER — METFORMIN HCL 500 MG PO TABS: 500.0000 mg | ORAL_TABLET | Freq: Two times a day (BID) | ORAL | 1 refills | Status: DC

## 2019-06-01 MED ORDER — TRESIBA FLEXTOUCH 100 UNIT/ML ~~LOC~~ SOPN: 21.0000 [IU] | PEN_INJECTOR | Freq: Every day | SUBCUTANEOUS | 1 refills | Status: DC

## 2019-06-01 NOTE — Progress Notes (Signed)
Subjective:     Patient ID: Shannon [name] , female    DOB: 08-20-1955 , 63 y.o.   MRN: [id number]   Chief Complaint  Patient presents with  . Diabetes  . Hypertension    HPI  She is now working at [employer] as an Therapist, sports night shift.  She continues to take Ozempic 0.25 mg, Tresiba 21 units daily and metformin. Continues to work [employer].    Wt Readings from Last 3 Encounters: 06/01/19 : 167 lb 9.6 oz (76 kg) 10/13/18 : 155 lb 13.8 oz (70.7 kg) 08/29/18 : 162 lb 12.8 oz (73.8 kg)   Diabetes She presents for her follow-up diabetic visit. She has type 2 diabetes mellitus. Her disease course has been stable. Pertinent negatives for hypoglycemia include no confusion, dizziness, headaches or nervousness/anxiousness. Pertinent negatives for diabetes include no blurred vision, no chest pain, no fatigue, no polydipsia, no polyphagia and no polyuria. There are no hypoglycemic complications. Symptoms are stable. There are no diabetic complications. Risk factors for coronary artery disease include diabetes mellitus, hypertension and obesity. Current diabetic treatment includes oral agent (dual therapy). She is compliant with treatment all of the time (she has only been taking 0.25 mg weekly, trying to spread out her medication when she did not have insurance). She is following a generally healthy diet. When asked about meal planning, she reported none. She has not had a previous visit with a dietitian. She participates in exercise every other day. Her home blood glucose trend is increasing steadily. Her overall blood glucose range is 140-180 mg/dl. (Blood sugars have been up to 368.  ) An ACE inhibitor/angiotensin II receptor blocker is being taken. She does not see a podiatrist.Eye exam current: not done at this time, needs a new opthamologist.  Hypertension This is a chronic problem. The current episode started more than 1 year ago. The problem is unchanged. The problem is controlled. Pertinent  negatives include no blurred vision, chest pain, headaches or palpitations. Risk factors for coronary artery disease include diabetes mellitus, obesity and sedentary lifestyle. Past treatments include ACE inhibitors. The current treatment provides significant improvement. There are no compliance problems.  There is no history of angina. There is no history of chronic renal disease.     Past Medical History:  Diagnosis Date  . Diabetes mellitus without complication ([place])   . Hypercholesteremia   . Hypertension      Family History  Problem Relation Age of Onset  . Hypertension Mother   . Diabetes Mother   . Diabetes Father   . Hypertension Father   . Hypertension Son   . Diabetes Son   . Breast cancer Neg Hx      Current Outpatient Medications:  .  acetaminophen (TYLENOL) 500 MG tablet, Take 500-1,000 mg by mouth every 6 (six) hours as needed for headache (or pain)., Disp: , Rfl:  .  Ascorbic Acid (VITAMIN C) 1000 MG tablet, Take 1 tablet (1,000 mg total) by mouth daily. Please take for 2 weeks., Disp: , Rfl:  .  atorvastatin (LIPITOR) 20 MG tablet, Take 1 tablet (20 mg total) by mouth daily. Hold for next 2 weeks, Disp: , Rfl:  .  Cholecalciferol (VITAMIN D-3 PO), Take 1 capsule by mouth daily., Disp: , Rfl:  .  lisinopril (ZESTRIL) 10 MG tablet, Take 1 tablet by mouth once daily, Disp: 90 tablet, Rfl: 0 .  metFORMIN (GLUCOPHAGE) 500 MG tablet, TAKE ONE TABLET BY MOUTH EVERY DAY WITH MORNING AND EVENING MEALS., Disp:  90 tablet, Rfl: 0 .  Semaglutide,0.25 or 0.5MG/DOS, (OZEMPIC, 0.25 OR 0.5 MG/DOSE,) 2 MG/1.5ML SOPN, Inject 0.25 mg into the skin once a week., Disp: 1 pen, Rfl: 3 .  TRESIBA FLEXTOUCH 100 UNIT/ML SOPN FlexTouch Pen, INJECT 21 UNITS SUBCUTANEOUSLY  AS PER INSULIN PROTOCOL, Disp: 5 pen, Rfl: 1 .  zinc sulfate (ZINC-220) 220 (50 Zn) MG capsule, Take 1 capsule (220 mg total) by mouth daily. Please take for 2 weeks, Disp: , Rfl:    No Known Allergies   Review of Systems   Constitutional: Negative.  Negative for fatigue.  Eyes: Negative for blurred vision and visual disturbance.  Respiratory: Negative.  Negative for cough.   Cardiovascular: Negative.  Negative for chest pain, palpitations and leg swelling.  Gastrointestinal: Negative.   Endocrine: Negative.  Negative for polydipsia, polyphagia and polyuria.  Skin: Negative.   Neurological: Negative.  Negative for dizziness and headaches.  Psychiatric/Behavioral: Negative for confusion. The patient is not nervous/anxious.      Today's Vitals   06/01/19 0852  BP: 140/78  Pulse: 67  Temp: 97.7 F (36.5 C)  TempSrc: Oral  Weight: 167 lb 9.6 oz (76 kg)  Height: 5' 2.4" (1.585 m)  PainSc: 0-No pain   Body mass index is 30.26 kg/m.   Objective:  Physical Exam Vitals signs reviewed.  Constitutional:      General: She is not in acute distress.    Appearance: Normal appearance. She is well-developed. She is obese.  Neck:     Musculoskeletal: Normal range of motion and neck supple.  Cardiovascular:     Rate and Rhythm: Normal rate and regular rhythm.     Pulses: Normal pulses.     Heart sounds: Normal heart sounds. No murmur.  Pulmonary:     Effort: Pulmonary effort is normal.     Breath sounds: Normal breath sounds.  Chest:     Chest wall: No tenderness.  Musculoskeletal: Normal range of motion.  Skin:    General: Skin is warm and dry.     Capillary Refill: Capillary refill takes less than 2 seconds.     Comments: Healing boils to right axilla and one to left axilla  Neurological:     General: No focal deficit present.     Mental Status: She is alert and oriented to person, place, and time.     Cranial Nerves: No cranial nerve deficit.  Psychiatric:        Mood and Affect: Mood normal.        Behavior: Behavior normal.        Thought Content: Thought content normal.        Judgment: Judgment normal.         Assessment And Plan:     1. Type 2 diabetes mellitus without complication,  with long-term current use of insulin (HCC)  Chronic, poorly controlled  She has not been seen since March for her diabetes due to not having insurance and being hospitalized for Covid19 in April  Diabetic foot exam done with decreased sensation bilateral feet with monofilament - Ambulatory referral to Ophthalmology - Hemoglobin A1c - CMP14+EGFR - Semaglutide,0.25 or 0.5MG/DOS, (OZEMPIC, 0.25 OR 0.5 MG/DOSE,) 2 MG/1.5ML SOPN; Inject 0.5 mg into the skin once a week.  Dispense: 9.5 mL; Refill: 1 - metFORMIN (GLUCOPHAGE) 500 MG tablet; Take 1 tablet (500 mg total) by mouth 2 (two) times daily with a meal.  Dispense: 90 tablet; Refill: 1 - lisinopril (ZESTRIL) 10 MG tablet; Take 1 tablet (10  mg total) by mouth daily.  Dispense: 90 tablet; Refill: 1 - insulin degludec (TRESIBA FLEXTOUCH) 100 UNIT/ML SOPN FlexTouch Pen; Inject 0.21 mLs (21 Units total) into the skin daily.  Dispense: 15 pen; Refill: 1  2. History of 2019 novel coronavirus disease (COVID-19)  Overall she is doing well  3. Encounter for hepatitis C screening test for low risk patient  Will check for Hepatitis C screening due to being born between the years 72-1967 - Hepatitis C antibody  4. Screening examination for STD (sexually transmitted disease)  - HIV antibody (with reflex)  5. Boils of multiple sites  She has healing boils with darkened areas to right axilla and one area to left axilla.  Advised when her blood sugar is improved this may help the boils.    [name], FNP

## 2019-06-02 LAB — CMP14+EGFR
ALT: 26 IU/L (ref 0–32)
AST: 16 IU/L (ref 0–40)
Albumin/Globulin Ratio: 1.9 (ref 1.2–2.2)
Albumin: 4.9 g/dL — ABNORMAL HIGH (ref 3.8–4.8)
Alkaline Phosphatase: 108 IU/L (ref 39–117)
BUN/Creatinine Ratio: 17 (ref 12–28)
BUN: 12 mg/dL (ref 8–27)
Bilirubin Total: 0.3 mg/dL (ref 0.0–1.2)
CO2: 24 mmol/L (ref 20–29)
Calcium: 10.2 mg/dL (ref 8.7–10.3)
Chloride: 96 mmol/L (ref 96–106)
Creatinine, Ser: 0.69 mg/dL (ref 0.57–1.00)
GFR calc Af Amer: 106 mL/min/{1.73_m2} (ref >59)
GFR calc non Af Amer: 92 mL/min/{1.73_m2} (ref >59)
Globulin, Total: 2.6 g/dL (ref 1.5–4.5)
Glucose: 341 mg/dL — ABNORMAL HIGH (ref 65–99)
Potassium: 3.8 mmol/L (ref 3.5–5.2)
Sodium: 137 mmol/L (ref 134–144)
Total Protein: 7.5 g/dL (ref 6.0–8.5)

## 2019-06-02 LAB — HEPATITIS C ANTIBODY: Hep C Virus Ab: <0.1 s/co ratio (ref 0.0–0.9)

## 2019-06-02 LAB — HIV ANTIBODY (ROUTINE TESTING W REFLEX): HIV Screen 4th Generation wRfx: NONREACTIVE

## 2019-06-02 LAB — HEMOGLOBIN A1C
Est. average glucose Bld gHb Est-mCnc: 278 mg/dL
Hgb A1c MFr Bld: 11.3 % — ABNORMAL HIGH (ref 4.8–5.6)

## 2019-07-20 ENCOUNTER — Other Ambulatory Visit: Payer: Self-pay

## 2019-07-20 NOTE — Patient Outreach (Signed)
  [facility]) Care Management Chronic Special Needs Program    07/20/2019  Name: Shannon [name], DOB: [birth year]  MRN: [id number]   Shannon [name] is enrolled in a chronic special needs plan for Diabetes. Telephone call to client to compete initial telephone assessment / review Health risk assessment. Unable to reach. HIPAA compliant voice message left with call back phone number.   PLAN: RNCM will attempt 2nd telephone call to patient within 1 week.   George Ina RN,BSN,CCM [facility] Telephonic  [phone]

## 2019-07-22 ENCOUNTER — Other Ambulatory Visit: Payer: Self-pay

## 2019-07-22 NOTE — Patient Outreach (Addendum)
  [facility]) Care Management Chronic Special Needs Program    07/22/2019  Name: Tanis Hensarling, DOB: Mar 29, 1956  MRN: [id number]   Ms. Rechelle Niebla is enrolled in a chronic special needs plan for Diabetes.  Voice mail message received from client. Return call attempted to compete initial telephone assessment/ Health risk assessment review.  Unable to reach. HIPAA compliant voice message left with call back phone number.   PLAN: RNCM will attempt 3rd telephone call to client within 1 week.   George Ina RN,BSN,CCM [facility] Telephonic  [phone]

## 2019-07-23 ENCOUNTER — Other Ambulatory Visit: Payer: Self-pay

## 2019-07-23 NOTE — Patient Outreach (Signed)
[facility]) Care Management Chronic Special Needs Program  07/23/2019  Name: Shannon [name] DOB: [birth year]  MRN: [id number]  Shannon [name] is enrolled in a chronic special needs plan for Diabetes. Chronic Care Management Coordinator telephoned client to review health risk assessment and to develop individualized care plan.  Introduced the chronic care management program, importance of client participation, and taking their care plan to all provider appointments and inpatient facilities.    Subjective: Telephone call to client. HIPAA verified. RNCM explained reason for call. Client states she received notification that [employer] had not received her paperwork from the doctor confirming her medical conditions therefore she may be dis enrolled from her health plan.  RNCM advised client to contact her primary MD office and request attestation form be completed.  Advised client to either have primary care office fax to HTA office or  Client pick up completed form from the office and hand carry to HTA office. RNCM advised client to contact her [employer] concierge to confirm attestation form can be delivered to [employer] office.  Client verbalized understanding. Client states her most recent blood sugar is 11.3. She reports her blood sugars range from 147 to 257. Client reports she is taking her medications as prescribed now. Client states prior to her Medicare insurance starting on 05/26/19 she went several months without insurance. Client states during that time she was only taking half of her diabetes medication/ insulin to conserve. Client states since she's been taking her medication consistently and as prescribed her A1c level will come down.  She states she has a follow up appointment with her primary MD next week for a recheck of her A1c.  Client states she is working third shift which also makes it difficult at times to manage her diabetes.  Client reports she did not check her blood sugars this morning when she got off from work. She reports yesterday's fasting blood sugar was 156.  Client reports she is not exercising at this time. RNCM discussed strategies to improve Hgb A1c. Discussed carbohydrate controlled meal planning.  RNCM addressed provider listed hypertension diagnosis with client. Client states she does not have high blood pressure. Client states she is taking the high blood pressure medication lisinopril to protect her kidneys. Client states she needs to get a new eye doctor and dentist. RNCM advised client to contact her HTA concierge for a list of in plan providers. Client verbalized understanding.   Goals Addressed            This Visit's Progress   . Client understands the importance of follow-up with providers by attending scheduled visits       RNCM discussed importance of keeping follow visits with providers.     . Diabetes Patient stated goal: " I want to find a new eye doctor" (pt-stated)       Advised client to contact her [employer] concierge and request a list of her health plan participating eye doctors. Advised client to contact new eye doctor once obtained and schedule appointment.     Marland Kitchen HEMOGLOBIN A1C < 7.0       Reviewed Hbg A1c targets and result of A1c done 06/01/19. Discussed strategies to improve Hgb A1c:  good medication taking behavior, Carbohydrate controlled meal planning, being active, and blood sugar monitoring    . Maintain timely refills of diabetic medication as prescribed within the year .       Take medications as  prescribed by physician    . Obtain annual  Lipid Profile, LDL-C       Advised client to schedule annual physical with primary MD for 2021    . Obtain Annual Eye (retinal)  Exam        Contact [employer] concierge to obtain in plan eye doctor    . Obtain Annual Foot Exam       Advised client to schedule annual physical with primary care provider  for 2021    . Obtain annual screen for micro albuminuria (urine) , nephropathy (kidney problems)       Advised client to schedule annual physical with primary MD for 2021    . Obtain Hemoglobin A1C at least 2 times per year       Advised client to keep every 3-4 month follow up visits with provider    . Visit Primary Care Provider or Endocrinologist at least 2 times per year        [facility] discussed importance of keeping follow up appointments with providers.       Assessment: Client is not meeting diabetes self-management goal of hemoglobin A1C of <7.0% with most recent reading of 11.3 % on 12/7 /20 without reports of hypoglycemia . Client has good understanding of:  COVID-19 cause, symptoms, precautions (social distancing, stay at home order, hand washing, wear face covering when unable to maintain or ensure 6 foot social distancing), and symptoms requiring provider notification.  Plan:  Send successful outreach letter with a copy of their individualized care plan and Send individual care plan to provider  Chronic care management coordination will outreach in:  9 Months    Quinn Plowman RN,BSN,CCM [place] [facility] [phone]

## 2019-07-23 NOTE — Patient Outreach (Signed)
  [facility]) Care Management Chronic Special Needs Program    07/23/2019  Name: Shannon [name], DOB: 12-16-1955  MRN: [id number]   [name] is enrolled in a chronic special needs plan for Diabetes. Telephone call attempted to client to compete initial telephone assessment/ Health risk assessment review.  Unable to reach. HIPAA compliant voice message left with call back phone number.   PLAN: RNCM will attempt follow up call to patient within 1 week.   George Ina RN,BSN,CCM [facility] Telephonic  [phone]

## 2019-07-27 ENCOUNTER — Ambulatory Visit: Payer: Self-pay

## 2019-08-04 ENCOUNTER — Other Ambulatory Visit: Payer: Self-pay | Admitting: *Deleted

## 2019-08-04 ENCOUNTER — Telehealth: Payer: Self-pay

## 2019-08-04 NOTE — Patient Outreach (Signed)
  [facility]) Care Management Chronic Special Needs Program    08/04/2019  Name: Shannon [name], DOB: Dec 18, 1955  MRN: [id number]   [name] is enrolled in a chronic special needs plan for Diabetes. Returned call to client  after she left message for [facility] stating she received a letter from [employer] that she has been disenrolled from the special needs plan because her provider did not provide documentation of her diabetes diagnosis to the plan.  Plan: After consulting with [employer] concierge services, left message for Ms. Schleich to contact her personal concierge at [phone] so they can assist her.   Cranford Mon RN, CCM, CDCES Chronic Care Management Coordinator [facility] [phone]

## 2019-08-04 NOTE — Telephone Encounter (Signed)
Spoke with Shannon [name] with [employer]. Confirmed that I spoke with them last week to give diagnosis to keep benefits. Lequita Halt confirmed this and explained to me that she would send a task to enrolments to make sure that her benefits were not cancelled for this reason.   Spoke with Shannon [name] to update her with this info

## 2019-08-07 ENCOUNTER — Ambulatory Visit: Payer: Self-pay

## 2019-08-11 ENCOUNTER — Encounter: Payer: Self-pay | Admitting: Nurse Practitioner

## 2019-08-11 ENCOUNTER — Ambulatory Visit (INDEPENDENT_AMBULATORY_CARE_PROVIDER_SITE_OTHER): Payer: HMO | Admitting: Nurse Practitioner

## 2019-08-11 ENCOUNTER — Other Ambulatory Visit: Payer: Self-pay

## 2019-08-11 VITALS — BP 126/80 | HR 88 | Temp 98.5°F | Ht 62.4 in | Wt 162.6 lb

## 2019-08-11 DIAGNOSIS — E2839 Other primary ovarian failure: Secondary | ICD-10-CM

## 2019-08-11 DIAGNOSIS — E119 Type 2 diabetes mellitus without complications: Secondary | ICD-10-CM

## 2019-08-11 DIAGNOSIS — N760 Acute vaginitis: Secondary | ICD-10-CM | POA: Diagnosis not present

## 2019-08-11 DIAGNOSIS — Z23 Encounter for immunization: Secondary | ICD-10-CM

## 2019-08-11 DIAGNOSIS — Z794 Long term (current) use of insulin: Secondary | ICD-10-CM

## 2019-08-11 LAB — POCT UA - MICROALBUMIN
Albumin/Creatinine Ratio, Urine, POC: 300
Creatinine, POC: 300 mg/dL
Microalbumin Ur, POC: 80 mg/L

## 2019-08-11 LAB — POCT URINALYSIS DIPSTICK
Glucose, UA: NEGATIVE
Ketones, UA: NEGATIVE
Nitrite, UA: NEGATIVE
Protein, UA: POSITIVE — AB
Spec Grav, UA: >=1.03 — AB (ref 1.010–1.025)
Urobilinogen, UA: 0.2 E.U./dL
pH, UA: 5.5 (ref 5.0–8.0)

## 2019-08-11 MED ORDER — LISINOPRIL 10 MG PO TABS: 10.0000 mg | ORAL_TABLET | Freq: Every day | ORAL | 1 refills | Status: DC

## 2019-08-11 MED ORDER — TRESIBA FLEXTOUCH 100 UNIT/ML ~~LOC~~ SOPN: 21.0000 [IU] | PEN_INJECTOR | Freq: Every day | SUBCUTANEOUS | 1 refills | Status: DC

## 2019-08-11 MED ORDER — PNEUMOCOCCAL 13-VAL CONJ VACC IM SUSP: 0.5000 mL | INTRAMUSCULAR | 0 refills | Status: AC

## 2019-08-11 MED ORDER — METFORMIN HCL 500 MG PO TABS: 500.0000 mg | ORAL_TABLET | Freq: Two times a day (BID) | ORAL | 1 refills | Status: DC

## 2019-08-11 MED ORDER — ATORVASTATIN CALCIUM 20 MG PO TABS: 20.0000 mg | ORAL_TABLET | Freq: Every day | ORAL | Status: DC

## 2019-08-11 NOTE — Progress Notes (Signed)
Subjective:     Patient ID: Shannon [name] , female    DOB: 06-21-1956 , 64 y.o.   MRN: [id number]   Chief Complaint  Patient presents with  . Diabetes    HPI  She is now working at [employer] as an Therapist, sports night shift.  She continues to take Ozempic 0.25 mg, Tresiba 21 units daily and metformin. Continues to work [employer].    Wt Readings from Last 3 Encounters: 08/11/19 : 162 lb 9.6 oz (73.8 kg) 06/01/19 : 167 lb 9.6 oz (76 kg) 10/13/18 : 155 lb 13.8 oz (70.7 kg)    Diabetes She presents for her follow-up diabetic visit. She has type 2 diabetes mellitus. Her disease course has been stable. Pertinent negatives for hypoglycemia include no confusion, dizziness, headaches or nervousness/anxiousness. Pertinent negatives for diabetes include no blurred vision, no chest pain, no fatigue, no polydipsia, no polyphagia and no polyuria. There are no hypoglycemic complications. Symptoms are stable. There are no diabetic complications. Risk factors for coronary artery disease include diabetes mellitus, hypertension and obesity. Current diabetic treatment includes oral agent (dual therapy) (Ozempic 0.20mand 21 units [place]. She is compliant with treatment all of the time (she has only been taking 0.25 mg weekly, trying to spread out her medication when she did not have insurance). Her weight is decreasing steadily. She is following a generally healthy diet. When asked about meal planning, she reported none. She has not had a previous visit with a dietitian. She participates in exercise every other day. Her home blood glucose trend is increasing steadily. Her overall blood glucose range is 140-180 mg/dl. (Blood sugars have been up to 178.  The highest has been 217 and lowest 117 ) An ACE inhibitor/angiotensin II receptor blocker is being taken. She does not see a podiatrist.Eye exam is not current (she has not made an appt.  ).  Hypertension This is a chronic problem. The current episode started  more than 1 year ago. The problem is unchanged. The problem is controlled. Pertinent negatives include no blurred vision, chest pain, headaches or palpitations. Risk factors for coronary artery disease include diabetes mellitus, obesity and sedentary lifestyle. Past treatments include ACE inhibitors. The current treatment provides significant improvement. There are no compliance problems.  There is no history of angina. There is no history of chronic renal disease.  Vaginal Itching The patient's pertinent negatives include no vaginal discharge. This is a new problem. The current episode started in the past 7 days. The problem occurs constantly. She is not pregnant. Pertinent negatives include no anorexia, constipation, dysuria or headaches. Nothing aggravates the symptoms. She uses hysterectomy for contraception.     Past Medical History:  Diagnosis Date  . Diabetes mellitus without complication ([place]   . Hypercholesteremia   . Hypertension      Family History  Problem Relation Age of Onset  . Hypertension Mother   . Diabetes Mother   . Diabetes Father   . Hypertension Father   . Hypertension Son   . Diabetes Son   . Breast cancer Neg Hx      Current Outpatient Medications:  .  acetaminophen (TYLENOL) 500 MG tablet, Take 500-1,000 mg by mouth every 6 (six) hours as needed for headache (or pain)., Disp: , Rfl:  .  Ascorbic Acid (VITAMIN C) 1000 MG tablet, Take 1 tablet (1,000 mg total) by mouth daily. Please take for 2 weeks., Disp: , Rfl:  .  atorvastatin (LIPITOR) 20 MG tablet, Take 1 tablet (20  mg total) by mouth daily. Hold for next 2 weeks, Disp: , Rfl:  .  Cholecalciferol (VITAMIN D-3 PO), Take 1 capsule by mouth daily., Disp: , Rfl:  .  insulin degludec (TRESIBA FLEXTOUCH) 100 UNIT/ML SOPN FlexTouch Pen, Inject 0.21 mLs (21 Units total) into the skin daily., Disp: 15 pen, Rfl: 1 .  lisinopril (ZESTRIL) 10 MG tablet, Take 1 tablet (10 mg total) by mouth daily., Disp: 90 tablet,  Rfl: 1 .  metFORMIN (GLUCOPHAGE) 500 MG tablet, Take 1 tablet (500 mg total) by mouth 2 (two) times daily with a meal., Disp: 90 tablet, Rfl: 1 .  Semaglutide,0.25 or 0.5MG/DOS, (OZEMPIC, 0.25 OR 0.5 MG/DOSE,) 2 MG/1.5ML SOPN, Inject 0.5 mg into the skin once a week., Disp: 9.5 mL, Rfl: 1 .  zinc sulfate (ZINC-220) 220 (50 Zn) MG capsule, Take 1 capsule (220 mg total) by mouth daily. Please take for 2 weeks, Disp: , Rfl:    No Known Allergies   Review of Systems  Constitutional: Negative.  Negative for fatigue.  Eyes: Negative for blurred vision and visual disturbance.  Respiratory: Negative.  Negative for cough.   Cardiovascular: Negative.  Negative for chest pain, palpitations and leg swelling.  Gastrointestinal: Negative for anorexia and constipation.  Endocrine: Negative.  Negative for polydipsia, polyphagia and polyuria.  Genitourinary: Negative for dysuria and vaginal discharge.  Skin: Negative.   Neurological: Negative.  Negative for dizziness and headaches.  Psychiatric/Behavioral: Negative for confusion. The patient is not nervous/anxious.      Today's Vitals   08/11/19 0836  BP: 126/80  Pulse: 88  Temp: 98.5 F (36.9 C)  TempSrc: Oral  Weight: 162 lb 9.6 oz (73.8 kg)  Height: 5' 2.4" (1.585 m)  PainSc: 0-No pain   Body mass index is 29.36 kg/m.   Objective:  Physical Exam Vitals reviewed.  Constitutional:      General: She is not in acute distress.    Appearance: Normal appearance. She is well-developed.  Cardiovascular:     Rate and Rhythm: Normal rate and regular rhythm.     Pulses: Normal pulses.     Heart sounds: Normal heart sounds. No murmur.  Pulmonary:     Effort: Pulmonary effort is normal.     Breath sounds: Normal breath sounds.  Chest:     Chest wall: No tenderness.  Musculoskeletal:        General: Normal range of motion.     Cervical back: Normal range of motion and neck supple.  Skin:    General: Skin is warm and dry.     Capillary  Refill: Capillary refill takes less than 2 seconds.  Neurological:     General: No focal deficit present.     Mental Status: She is alert and oriented to person, place, and time.     Cranial Nerves: No cranial nerve deficit.  Psychiatric:        Mood and Affect: Mood normal.        Behavior: Behavior normal.        Thought Content: Thought content normal.        Judgment: Judgment normal.         Assessment And Plan:     1. Type 2 diabetes mellitus without complication, with long-term current use of insulin (HCC)  Chronic, poorly controlled  She is doing well with the Ozempic, will recheck HgbA1c.   She needs a new glucometer per her insurance  She is to call Dr. Idolina Primer for an appt if he  does not do diabetic eye exams she will need to call to office for a new referral - atorvastatin (LIPITOR) 20 MG tablet; Take 1 tablet (20 mg total) by mouth daily. Hold for next 2 weeks  Dispense:   - insulin degludec (TRESIBA FLEXTOUCH) 100 UNIT/ML SOPN FlexTouch Pen; Inject 0.21 mLs (21 Units total) into the skin daily.  Dispense: 15 pen; Refill: 1 - lisinopril (ZESTRIL) 10 MG tablet; Take 1 tablet (10 mg total) by mouth daily.  Dispense: 90 tablet; Refill: 1 - metFORMIN (GLUCOPHAGE) 500 MG tablet; Take 1 tablet (500 mg total) by mouth 2 (two) times daily with a meal.  Dispense: 90 tablet; Refill: 1 - POCT Urinalysis Dipstick (81002) - CMP14+EGFR - Hemoglobin A1c - Lipid panel  2. Acute vaginitis  Will check urinalysis to see if she has glucose in her urine  3. Decreased estrogen level  - DG Bone Density; Future  4. Encounter for immunization  Rx sent to pharmacy - pneumococcal 13-valent conjugate vaccine (PREVNAR 13) SUSP injection; Inject 0.5 mLs into the muscle tomorrow at 10 am for 1 dose.  Dispense: 0.5 mL; Refill: 0        [name], FNP

## 2019-08-12 LAB — CMP14+EGFR
ALT: 18 IU/L (ref 0–32)
AST: 12 IU/L (ref 0–40)
Albumin/Globulin Ratio: 1.8 (ref 1.2–2.2)
Albumin: 4.6 g/dL (ref 3.8–4.8)
Alkaline Phosphatase: 88 IU/L (ref 39–117)
BUN/Creatinine Ratio: 22 (ref 12–28)
BUN: 14 mg/dL (ref 8–27)
Bilirubin Total: 0.5 mg/dL (ref 0.0–1.2)
CO2: 24 mmol/L (ref 20–29)
Calcium: 9.8 mg/dL (ref 8.7–10.3)
Chloride: 102 mmol/L (ref 96–106)
Creatinine, Ser: 0.63 mg/dL (ref 0.57–1.00)
GFR calc Af Amer: 109 mL/min/{1.73_m2} (ref >59)
GFR calc non Af Amer: 94 mL/min/{1.73_m2} (ref >59)
Globulin, Total: 2.6 g/dL (ref 1.5–4.5)
Glucose: 137 mg/dL — ABNORMAL HIGH (ref 65–99)
Potassium: 3.8 mmol/L (ref 3.5–5.2)
Sodium: 141 mmol/L (ref 134–144)
Total Protein: 7.2 g/dL (ref 6.0–8.5)

## 2019-08-12 LAB — LIPID PANEL
Chol/HDL Ratio: 4.1 ratio (ref 0.0–4.4)
Cholesterol, Total: 193 mg/dL (ref 100–199)
HDL: 47 mg/dL (ref >39)
LDL Chol Calc (NIH): 101 mg/dL — ABNORMAL HIGH (ref 0–99)
Triglycerides: 263 mg/dL — ABNORMAL HIGH (ref 0–149)
VLDL Cholesterol Cal: 45 mg/dL — ABNORMAL HIGH (ref 5–40)

## 2019-08-12 LAB — HEMOGLOBIN A1C
Est. average glucose Bld gHb Est-mCnc: 223 mg/dL
Hgb A1c MFr Bld: 9.4 % — ABNORMAL HIGH (ref 4.8–5.6)

## 2019-08-18 ENCOUNTER — Other Ambulatory Visit: Payer: Self-pay | Admitting: Nurse Practitioner

## 2019-08-18 ENCOUNTER — Encounter: Payer: Self-pay | Admitting: Internal Medicine

## 2019-08-18 DIAGNOSIS — E119 Type 2 diabetes mellitus without complications: Secondary | ICD-10-CM

## 2019-08-18 DIAGNOSIS — Z794 Long term (current) use of insulin: Secondary | ICD-10-CM

## 2019-08-31 ENCOUNTER — Ambulatory Visit: Payer: Self-pay | Admitting: Nurse Practitioner

## 2019-09-04 ENCOUNTER — Other Ambulatory Visit: Payer: Self-pay | Admitting: Nurse Practitioner

## 2019-09-04 DIAGNOSIS — Z1231 Encounter for screening mammogram for malignant neoplasm of breast: Secondary | ICD-10-CM

## 2019-09-04 DIAGNOSIS — E2839 Other primary ovarian failure: Secondary | ICD-10-CM

## 2019-09-14 DIAGNOSIS — Z961 Presence of intraocular lens: Secondary | ICD-10-CM | POA: Diagnosis not present

## 2019-09-14 DIAGNOSIS — H35373 Puckering of macula, bilateral: Secondary | ICD-10-CM | POA: Diagnosis not present

## 2019-09-14 DIAGNOSIS — E11319 Type 2 diabetes mellitus with unspecified diabetic retinopathy without macular edema: Secondary | ICD-10-CM | POA: Diagnosis not present

## 2019-09-14 LAB — HM DIABETES EYE EXAM

## 2019-09-16 ENCOUNTER — Encounter: Payer: Self-pay | Admitting: Nurse Practitioner

## 2019-09-18 ENCOUNTER — Ambulatory Visit: Payer: HMO | Attending: Internal Medicine

## 2019-09-18 DIAGNOSIS — Z23 Encounter for immunization: Secondary | ICD-10-CM

## 2019-09-18 NOTE — Progress Notes (Signed)
   Covid-19 Vaccination Clinic  Name:  Shannon [name]    MRN: [id number] DOB: 03/17/1956  09/18/2019  Shannon [name] was observed post Covid-19 immunization for 15 minutes without incident. She was provided with Vaccine Information Sheet and instruction to access the V-Safe system.   Shannon [name] was instructed to call 911 with any severe reactions post vaccine: Marland Kitchen Difficulty breathing  . Swelling of face and throat  . A fast heartbeat  . A bad rash all over body  . Dizziness and weakness   Immunizations Administered    Name Date Dose VIS Date Route   Pfizer COVID-19 Vaccine 09/18/2019  9:08 AM 0.3 mL 06/05/2019 Intramuscular   Manufacturer: [employer], [employer]   Lot: [id number]   NDC: [address]-3

## 2019-09-23 DIAGNOSIS — E113311 Type 2 diabetes mellitus with moderate nonproliferative diabetic retinopathy with macular edema, right eye: Secondary | ICD-10-CM | POA: Diagnosis not present

## 2019-09-23 DIAGNOSIS — E113312 Type 2 diabetes mellitus with moderate nonproliferative diabetic retinopathy with macular edema, left eye: Secondary | ICD-10-CM | POA: Diagnosis not present

## 2019-09-23 DIAGNOSIS — H359 Unspecified retinal disorder: Secondary | ICD-10-CM | POA: Diagnosis not present

## 2019-09-28 ENCOUNTER — Other Ambulatory Visit: Payer: Self-pay | Admitting: Nurse Practitioner

## 2019-09-28 ENCOUNTER — Other Ambulatory Visit: Payer: Self-pay

## 2019-09-28 DIAGNOSIS — Z794 Long term (current) use of insulin: Secondary | ICD-10-CM

## 2019-09-28 DIAGNOSIS — E119 Type 2 diabetes mellitus without complications: Secondary | ICD-10-CM

## 2019-09-28 MED ORDER — LISINOPRIL 10 MG PO TABS: 10.0000 mg | ORAL_TABLET | Freq: Every day | ORAL | 0 refills | Status: DC

## 2019-09-28 MED ORDER — METFORMIN HCL 500 MG PO TABS: ORAL_TABLET | ORAL | 0 refills | Status: DC

## 2019-09-29 ENCOUNTER — Encounter (INDEPENDENT_AMBULATORY_CARE_PROVIDER_SITE_OTHER): Payer: Self-pay | Admitting: Ophthalmology

## 2019-09-29 ENCOUNTER — Ambulatory Visit (INDEPENDENT_AMBULATORY_CARE_PROVIDER_SITE_OTHER): Payer: HMO | Admitting: Ophthalmology

## 2019-09-29 ENCOUNTER — Other Ambulatory Visit: Payer: Self-pay

## 2019-09-29 DIAGNOSIS — E113312 Type 2 diabetes mellitus with moderate nonproliferative diabetic retinopathy with macular edema, left eye: Secondary | ICD-10-CM

## 2019-09-29 MED ORDER — BEVACIZUMAB CHEMO INJECTION 1.25MG/0.05ML SYRINGE FOR KALEIDOSCOPE
1.2500 mg | INTRAVITREAL | Status: AC | PRN
  Administered 2019-09-29: 1.25 mg via INTRAVITREAL

## 2019-10-06 ENCOUNTER — Ambulatory Visit (INDEPENDENT_AMBULATORY_CARE_PROVIDER_SITE_OTHER): Payer: HMO | Admitting: Ophthalmology

## 2019-10-06 ENCOUNTER — Other Ambulatory Visit: Payer: Self-pay

## 2019-10-06 ENCOUNTER — Encounter (INDEPENDENT_AMBULATORY_CARE_PROVIDER_SITE_OTHER): Payer: Self-pay | Admitting: Ophthalmology

## 2019-10-06 DIAGNOSIS — E113311 Type 2 diabetes mellitus with moderate nonproliferative diabetic retinopathy with macular edema, right eye: Secondary | ICD-10-CM | POA: Diagnosis not present

## 2019-10-06 DIAGNOSIS — E113411 Type 2 diabetes mellitus with severe nonproliferative diabetic retinopathy with macular edema, right eye: Secondary | ICD-10-CM | POA: Insufficient documentation

## 2019-10-06 DIAGNOSIS — E113492 Type 2 diabetes mellitus with severe nonproliferative diabetic retinopathy without macular edema, left eye: Secondary | ICD-10-CM | POA: Insufficient documentation

## 2019-10-06 DIAGNOSIS — E113491 Type 2 diabetes mellitus with severe nonproliferative diabetic retinopathy without macular edema, right eye: Secondary | ICD-10-CM | POA: Insufficient documentation

## 2019-10-06 DIAGNOSIS — E113412 Type 2 diabetes mellitus with severe nonproliferative diabetic retinopathy with macular edema, left eye: Secondary | ICD-10-CM | POA: Insufficient documentation

## 2019-10-06 DIAGNOSIS — H359 Unspecified retinal disorder: Secondary | ICD-10-CM

## 2019-10-06 DIAGNOSIS — E113312 Type 2 diabetes mellitus with moderate nonproliferative diabetic retinopathy with macular edema, left eye: Secondary | ICD-10-CM

## 2019-10-06 MED ORDER — BEVACIZUMAB CHEMO INJECTION 1.25MG/0.05ML SYRINGE FOR KALEIDOSCOPE
1.2500 mg | INTRAVITREAL | Status: AC | PRN
  Administered 2019-10-06: 1.25 mg via INTRAVITREAL

## 2019-10-06 NOTE — Progress Notes (Signed)
10/06/2019     CHIEF COMPLAINT Patient presents for Eye Exam   HISTORY OF PRESENT ILLNESS: Shannon [name] is a 64 y.o. female who presents to the clinic today for:   HPI    Eye Exam    In right eye.  Characterized as no change in [place].  Severity is mild.  I, the attending physician,  performed the HPI with the patient and updated documentation appropriately.          Comments    Possible Avastin OD and OCT  Pt states no changes in vision. Pt did well after last inj of OS.  BGL: 134 yesterday       Last edited by Tilda Franco on 10/06/2019  9:04 AM. (History)      Referring physician: Glendale Chard, [address] [place],  [place] [address]  HISTORICAL INFORMATION:   Selected notes from the MEDICAL RECORD NUMBER    Lab Results  Component Value Date   HGBA1C 9.4 (H) 08/11/2019     CURRENT MEDICATIONS: No current outpatient medications on file. (Ophthalmic Drugs)   No current facility-administered medications for this visit. (Ophthalmic Drugs)   Current Outpatient Medications (Other)  Medication Sig  . acetaminophen (TYLENOL) 500 MG tablet Take 500-1,000 mg by mouth every 6 (six) hours as needed for headache (or pain).  . Ascorbic Acid (VITAMIN C) 1000 MG tablet Take 1 tablet (1,000 mg total) by mouth daily. Please take for 2 weeks.  Marland Kitchen atorvastatin (LIPITOR) 20 MG tablet Take 1 tablet (20 mg total) by mouth daily. Hold for next 2 weeks  . Cholecalciferol (VITAMIN D-3 PO) Take 1 capsule by mouth daily.  . insulin degludec (TRESIBA FLEXTOUCH) 100 UNIT/ML SOPN FlexTouch Pen Inject 0.21 mLs (21 Units total) into the skin daily.  Marland Kitchen lisinopril (ZESTRIL) 10 MG tablet Take 1 tablet (10 mg total) by mouth daily.  . metFORMIN (GLUCOPHAGE) 500 MG tablet TAKE ONE TABLET BY MOUTH EVERY DAY WITH MORNING AND EVENING MEALS  . Semaglutide,0.25 or 0.5MG /DOS, (OZEMPIC, 0.25 OR 0.5 MG/DOSE,) 2 MG/1.5ML SOPN Inject 0.5 mg into the skin once a week.  .  SODIUM FLUORIDE 5000 SENSITIVE 1.1-5 % PSTE USE 3 4 TIMES PER WEEK AT NIGHT BEFORE BED  . zinc sulfate (ZINC-220) 220 (50 Zn) MG capsule Take 1 capsule (220 mg total) by mouth daily. Please take for 2 weeks  . polyethylene glycol powder (GLYCOLAX/MIRALAX) 17 GM/SCOOP powder polyethylene glycol 3350 17 gram oral powder packet  TAKE 1 PACKET (17G) BY MOUTH DAILY   No current facility-administered medications for this visit. (Other)      REVIEW OF SYSTEMS:    ALLERGIES No Known Allergies  PAST MEDICAL HISTORY Past Medical History:  Diagnosis Date  . Diabetes mellitus without complication ([place])   . Hypercholesteremia   . Hypertension    Past Surgical History:  Procedure Laterality Date  . ABDOMINAL HYSTERECTOMY    . CATARACT EXTRACTION Bilateral   . CESAREAN SECTION    . MYOMECTOMY      FAMILY HISTORY Family History  Problem Relation Age of Onset  . Hypertension Mother   . Diabetes Mother   . Diabetes Father   . Hypertension Father   . Hypertension Son   . Diabetes Son   . Breast cancer Neg Hx     SOCIAL HISTORY Social History   Tobacco Use  . Smoking status: Former Research scientist (life sciences)  . Smokeless tobacco: Never Used  . Tobacco comment: quit 10 yrs  Substance Use Topics  . Alcohol use: Yes    Comment: rarely  . Drug use: Never         OPHTHALMIC EXAM: Base Eye Exam    Visual Acuity (Snellen - Linear)      Right Left   Dist [place] 20/25 -1 20/20 -1       Tonometry (Tonopen, 9:09 AM)      Right Left   Pressure 15 13       Pupils      Dark Light Shape React APD   Right 2 2 Round Minimal None   Left 2 2 Round Minimal None       Visual Fields (Counting fingers)      Left Right    Full Full       Neuro/Psych    Oriented x3: Yes   Mood/Affect: Normal        Slit Lamp and Fundus Exam    External Exam      Right Left   External Normal Normal       Slit Lamp Exam      Right Left   Lids/Lashes Normal Normal   Conjunctiva/Sclera White and quiet White  and quiet   Cornea Clear Clear   Anterior Chamber Deep and quiet Deep and quiet   Iris Round and reactive Round and reactive   Lens Posterior chamber intraocular lens Posterior chamber intraocular lens   Vitreous Normal Normal          IMAGING AND PROCEDURES  Imaging and Procedures for 10/06/19  Intravitreal Injection, Pharmacologic Agent - OD - Right Eye       Time Out 10/06/2019. 10:21 AM. Confirmed correct patient, procedure, site, and patient consented.   Anesthesia Topical anesthesia was used. Anesthetic medications included Akten 3.5%.   Procedure Preparation included Ofloxacin . A 30 gauge needle was used.   Injection:  1.25 mg Bevacizumab (AVASTIN) SOLN   NDC: [address]-8, Lot: [id number]   Route: Intravitreal, Site: Right Eye, Waste: 0 mg  Post-op Post injection exam found visual acuity of at least counting fingers. The patient tolerated the procedure well. There were no complications. The patient received written and verbal post procedure care education. Post injection medications were not given.                 ASSESSMENT/PLAN:  @PROBAPNOTE @    ICD-10-CM   1. Moderate nonproliferative diabetic retinopathy of right eye with macular edema associated with type 2 diabetes mellitus (HCC)  E11.3311 OCT, Retina - OU - Both Eyes    Intravitreal Injection, Pharmacologic Agent - OD - Right Eye  2. Moderate nonproliferative diabetic retinopathy of left eye with macular edema associated with type 2 diabetes mellitus (HCC)  E11.3312 OCT, Retina - OU - Both Eyes  3. Retinal exudates and deposits  H35.9     1.  Vitreal Avastin OD performed today  2.  OS now 1 week status post Avastin, with minor change in CSN me  3.  OU, dilated next visit and consider repeat Avastin in 1 eye  4.     Ophthalmic Meds Ordered this visit:  No orders of the defined types were placed in this encounter.      No follow-ups on file.  There are no Patient Instructions on file for  this visit.   Explained the diagnoses, plan, and follow up with the patient and they expressed understanding.  Patient expressed understanding of the importance of proper follow up care.   05-19-1972  Sabino Snipes M.D. Diseases & Surgery of the Retina and Vitreous [facility] 10/06/19     Abbreviations: M myopia (nearsighted); A astigmatism; H hyperopia (farsighted); P presbyopia; Mrx spectacle prescription;  CTL contact lenses; OD right eye; OS left eye; OU both eyes  XT exotropia; ET esotropia; PEK punctate epithelial keratitis; PEE punctate epithelial erosions; DES dry eye syndrome; MGD meibomian gland dysfunction; ATs artificial tears; PFAT's preservative free artificial tears; NSC nuclear sclerotic cataract; PSC posterior subcapsular cataract; ERM epi-retinal membrane; PVD posterior vitreous detachment; RD retinal detachment; DM diabetes mellitus; DR diabetic retinopathy; NPDR non-proliferative diabetic retinopathy; PDR proliferative diabetic retinopathy; CSME clinically significant macular edema; DME diabetic macular edema; dbh dot blot hemorrhages; CWS cotton wool spot; POAG primary open angle glaucoma; C/D cup-to-disc ratio; HVF humphrey visual field; GVF goldmann visual field; OCT optical coherence tomography; IOP intraocular pressure; BRVO Branch retinal vein occlusion; CRVO central retinal vein occlusion; CRAO central retinal artery occlusion; BRAO branch retinal artery occlusion; RT retinal tear; SB scleral buckle; PPV pars plana vitrectomy; VH Vitreous hemorrhage; PRP panretinal laser photocoagulation; IVK intravitreal kenalog; VMT vitreomacular traction; MH Macular hole;  NVD neovascularization of the disc; NVE neovascularization elsewhere; AREDS age related eye disease study; ARMD age related macular degeneration; POAG primary open angle glaucoma; EBMD epithelial/anterior basement membrane dystrophy; ACIOL anterior chamber intraocular lens; IOL intraocular lens; PCIOL posterior  chamber intraocular lens; Phaco/IOL phacoemulsification with intraocular lens placement; PRK photorefractive keratectomy; LASIK laser assisted in situ keratomileusis; HTN hypertension; DM diabetes mellitus; COPD chronic obstructive pulmonary disease

## 2019-10-07 ENCOUNTER — Encounter: Payer: Self-pay | Admitting: Internal Medicine

## 2019-10-14 ENCOUNTER — Ambulatory Visit: Payer: HMO | Attending: Internal Medicine

## 2019-10-14 DIAGNOSIS — Z23 Encounter for immunization: Secondary | ICD-10-CM

## 2019-10-14 NOTE — Progress Notes (Signed)
   Covid-19 Vaccination Clinic  Name:  Shannon [name]    MRN: [id number] DOB: 04/16/1956  10/14/2019  [name] was observed post Covid-19 immunization for 15 minutes without incident. She was provided with Vaccine Information Sheet and instruction to access the V-Safe system.   [name] was instructed to call 911 with any severe reactions post vaccine: Marland Kitchen Difficulty breathing  . Swelling of face and throat  . A fast heartbeat  . A bad rash all over body  . Dizziness and weakness   Immunizations Administered    Name Date Dose VIS Date Route   Pfizer COVID-19 Vaccine 10/14/2019  8:58 AM 0.3 mL 08/19/2018 Intramuscular   Manufacturer: [employer], [employer]   Lot: [id number]   NDC: [address]-5

## 2019-10-27 ENCOUNTER — Other Ambulatory Visit: Payer: Self-pay

## 2019-10-27 ENCOUNTER — Ambulatory Visit (INDEPENDENT_AMBULATORY_CARE_PROVIDER_SITE_OTHER): Payer: HMO | Admitting: Ophthalmology

## 2019-10-27 ENCOUNTER — Encounter (INDEPENDENT_AMBULATORY_CARE_PROVIDER_SITE_OTHER): Payer: Self-pay | Admitting: Ophthalmology

## 2019-10-27 DIAGNOSIS — E113311 Type 2 diabetes mellitus with moderate nonproliferative diabetic retinopathy with macular edema, right eye: Secondary | ICD-10-CM | POA: Diagnosis not present

## 2019-10-27 DIAGNOSIS — R0683 Snoring: Secondary | ICD-10-CM

## 2019-10-27 DIAGNOSIS — E113312 Type 2 diabetes mellitus with moderate nonproliferative diabetic retinopathy with macular edema, left eye: Secondary | ICD-10-CM

## 2019-10-27 MED ORDER — BEVACIZUMAB CHEMO INJECTION 1.25MG/0.05ML SYRINGE FOR KALEIDOSCOPE
1.2500 mg | INTRAVITREAL | Status: AC | PRN
  Administered 2019-10-27: 11:00:00 1.25 mg via INTRAVITREAL

## 2019-10-27 NOTE — Progress Notes (Signed)
10/27/2019     CHIEF COMPLAINT Patient presents for Retina Follow Up   HISTORY OF PRESENT ILLNESS: Shannon [name] is a 64 y.o. female who presents to the clinic today for:   HPI    Retina Follow Up    Patient presents with  Diabetic Retinopathy.  In both eyes.  This started 4 weeks ago.  Severity is mild.  Duration of 4 weeks.  Since onset it is stable.          Comments    4 Week Diabetic Exam OU, poss Avastin OS  Pt denies noticeable changes to [place] OU since last visit. No flashes or floaters OU. Pt reports scratchiness OS. LBS: 124 yesterday       Last edited by Ileana Roup, COA on 10/27/2019  9:42 AM. (History)      Referring physician: Dorothyann Peng, MD [address] [place],  [place] [address]  HISTORICAL INFORMATION:   Selected notes from the MEDICAL RECORD NUMBER    Lab Results  Component Value Date   HGBA1C 9.4 (H) 08/11/2019     CURRENT MEDICATIONS: No current outpatient medications on file. (Ophthalmic Drugs)   No current facility-administered medications for this visit. (Ophthalmic Drugs)   Current Outpatient Medications (Other)  Medication Sig  . acetaminophen (TYLENOL) 500 MG tablet Take 500-1,000 mg by mouth every 6 (six) hours as needed for headache (or pain).  . Ascorbic Acid (VITAMIN C) 1000 MG tablet Take 1 tablet (1,000 mg total) by mouth daily. Please take for 2 weeks.  Marland Kitchen atorvastatin (LIPITOR) 20 MG tablet Take 1 tablet (20 mg total) by mouth daily. Hold for next 2 weeks  . Cholecalciferol (VITAMIN D-3 PO) Take 1 capsule by mouth daily.  . insulin degludec (TRESIBA FLEXTOUCH) 100 UNIT/ML SOPN FlexTouch Pen Inject 0.21 mLs (21 Units total) into the skin daily.  Marland Kitchen lisinopril (ZESTRIL) 10 MG tablet Take 1 tablet (10 mg total) by mouth daily.  . metFORMIN (GLUCOPHAGE) 500 MG tablet TAKE ONE TABLET BY MOUTH EVERY DAY WITH MORNING AND EVENING MEALS  . polyethylene glycol powder (GLYCOLAX/MIRALAX) 17 GM/SCOOP powder polyethylene  glycol 3350 17 gram oral powder packet  TAKE 1 PACKET (17G) BY MOUTH DAILY  . Semaglutide,0.25 or 0.5MG /DOS, (OZEMPIC, 0.25 OR 0.5 MG/DOSE,) 2 MG/1.5ML SOPN Inject 0.5 mg into the skin once a week.  . SODIUM FLUORIDE 5000 SENSITIVE 1.1-5 % PSTE USE 3 4 TIMES PER WEEK AT NIGHT BEFORE BED  . zinc sulfate (ZINC-220) 220 (50 Zn) MG capsule Take 1 capsule (220 mg total) by mouth daily. Please take for 2 weeks   No current facility-administered medications for this visit. (Other)      REVIEW OF SYSTEMS:    ALLERGIES No Known Allergies  PAST MEDICAL HISTORY Past Medical History:  Diagnosis Date  . Diabetes mellitus without complication (HCC)   . Hypercholesteremia   . Hypertension    Past Surgical History:  Procedure Laterality Date  . ABDOMINAL HYSTERECTOMY    . CATARACT EXTRACTION Bilateral   . CESAREAN SECTION    . MYOMECTOMY      FAMILY HISTORY Family History  Problem Relation Age of Onset  . Hypertension Mother   . Diabetes Mother   . Diabetes Father   . Hypertension Father   . Hypertension Son   . Diabetes Son   . Breast cancer Neg Hx     SOCIAL HISTORY Social History   Tobacco Use  . Smoking status: Former Games developer  . Smokeless tobacco:  Never Used  . Tobacco comment: quit 10 yrs  Substance Use Topics  . Alcohol use: Yes    Comment: rarely  . Drug use: Never         OPHTHALMIC EXAM:  Base Eye Exam    Visual Acuity (ETDRS)      Right Left   Dist [place] 20/40 +2 20/25 -1   Dist ph [place] 20/25 +2        Tonometry (Tonopen, 9:47 AM)      Right Left   Pressure 16 18       Pupils      Pupils Dark Light Shape React APD   Right PERRL 4 3 Round Brisk None   Left PERRL 4 3 Round Brisk None       Visual Fields (Counting fingers)      Left Right    Full Full       Extraocular Movement      Right Left    Full Full       Neuro/Psych    Oriented x3: Yes   Mood/Affect: Normal       Dilation    Both eyes: 1.0% Mydriacyl, 2.5% Phenylephrine @ 9:47  AM        Slit Lamp and Fundus Exam    External Exam      Right Left   External Normal Normal       Slit Lamp Exam      Right Left   Lids/Lashes Normal Normal   Conjunctiva/Sclera White and quiet White and quiet   Cornea Clear Clear   Anterior Chamber Deep and quiet Deep and quiet   Iris Round and reactive Round and reactive   Lens Posterior chamber intraocular lens Posterior chamber intraocular lens   Anterior Vitreous Normal Normal       Fundus Exam      Right Left   Posterior Vitreous Normal Normal   Disc Normal Normal   C/D Ratio 0.1 0.1   Macula Clinically significant macular edema, Exudates, Macular thickening Microaneurysms, Mild clinically significant macular edema   Vessels NPDR- Moderate NPDR- Moderate   Periphery Normal Normal          IMAGING AND PROCEDURES  Imaging and Procedures for 10/27/19  OCT, Retina - OU - Both Eyes       Right Eye Quality was good. Scan locations included subfoveal. Central Foveal Thickness: 319. Progression has been stable.   Left Eye Quality was good. Scan locations included subfoveal. Central Foveal Thickness: 276. Progression has improved. Findings include abnormal foveal contour.   Notes OD, with active region of CSME temporal and superotemporal.  Little to no improvement on Avastin No. 1.  At 2-week interval today.   tThis does raise the specter of possible MACTEL.  OS, with more modest CSME localized particularly temporal to the foveal avascular zone which did improve post Avastin No. 1, at 4 weeks today will repeat Avastin OS today       Intravitreal Injection, Pharmacologic Agent - OS - Left Eye       Time Out 10/27/2019. 10:53 AM. Confirmed correct patient, procedure, site, and patient consented.   Anesthesia Topical anesthesia was used. Anesthetic medications included Akten 3.5%.   Procedure Preparation included Ofloxacin , 10% betadine to eyelids. A 30 gauge needle was used.   Injection:  1.25 mg  Bevacizumab (AVASTIN) SOLN   NDC: [address]-8, Lot: [id number]   Route: Intravitreal, Site: Left Eye, Waste: 0 mg  Post-op Post injection exam  found visual acuity of at least counting fingers. The patient tolerated the procedure well. There were no complications. The patient received written and verbal post procedure care education. Post injection medications were not given.                 ASSESSMENT/PLAN:  Moderate nonproliferative diabetic retinopathy of left eye with macular edema associated with type 2 diabetes mellitus (HCC)  The nature of diabetic macular edema was discussed with the patient. Treatment options were outlined including medical therapy, laser & vitrectomy. The use of injectable medications reviewed, including Avastin, Lucentis, and Eylea. Periodic injections into the eye are likely to resolve diabetic macular edema (swelling in the center of vision). Initially, injections are delivered are delivered every 4-6 weeks, and the interval extended as the condition improves. On average, 8-9 injections the first year, and 5 in year 2. Improvement in the condition most often improves on medical therapy. Occasional use of focal laser is also recommended for residual macular edema (swelling). Excellent control of blood glucose and blood pressure are encouraged under the care of a primary physician or endocrinologist. Similarly, attempts to maintain serum cholesterol, low density lipoproteins, and high-density lipoproteins in a favorable range were recommended.   OS improved post Avastin No. 1 at 4-week interval  Moderate nonproliferative diabetic retinopathy of right eye with macular edema (HCC) CSME OD, stable but not improved at 2-week interval post Avastin.      ICD-10-CM   1. Moderate nonproliferative diabetic retinopathy of left eye with macular edema associated with type 2 diabetes mellitus (HCC)  E11.3312 OCT, Retina - OU - Both Eyes    Intravitreal Injection, Pharmacologic  Agent - OS - Left Eye    Bevacizumab (AVASTIN) SOLN 1.25 mg  2. Snores  R06.83   3. Moderate nonproliferative diabetic retinopathy of right eye with macular edema associated with type 2 diabetes mellitus (HCC)  E11.3311     1.  OS, repeat examination possible intravitreal Avastin in 6 weeks  2.  OD, repeat examination and possible intravitreal Avastin as scheduled in 4 weeks  3.  Ophthalmic Meds Ordered this visit:  Meds ordered this encounter  Medications  . Bevacizumab (AVASTIN) SOLN 1.25 mg       Return in about 6 weeks (around 12/08/2019) for AVASTIN OCT, OS.  There are no Patient Instructions on file for this visit.   Explained the diagnoses, plan, and follow up with the patient and they expressed understanding.  Patient expressed understanding of the importance of proper follow up care.   Alford Highland Alberto Schoch M.D. Diseases & Surgery of the Retina and Vitreous [facility] 10/27/19     Abbreviations: M myopia (nearsighted); A astigmatism; H hyperopia (farsighted); P presbyopia; Mrx spectacle prescription;  CTL contact lenses; OD right eye; OS left eye; OU both eyes  XT exotropia; ET esotropia; PEK punctate epithelial keratitis; PEE punctate epithelial erosions; DES dry eye syndrome; MGD meibomian gland dysfunction; ATs artificial tears; PFAT's preservative free artificial tears; NSC nuclear sclerotic cataract; PSC posterior subcapsular cataract; ERM epi-retinal membrane; PVD posterior vitreous detachment; RD retinal detachment; DM diabetes mellitus; DR diabetic retinopathy; NPDR non-proliferative diabetic retinopathy; PDR proliferative diabetic retinopathy; CSME clinically significant macular edema; DME diabetic macular edema; dbh dot blot hemorrhages; CWS cotton wool spot; POAG primary open angle glaucoma; C/D cup-to-disc ratio; HVF humphrey visual field; GVF goldmann visual field; OCT optical coherence tomography; IOP intraocular pressure; BRVO Branch retinal vein  occlusion; CRVO central retinal vein occlusion; CRAO central retinal  artery occlusion; BRAO branch retinal artery occlusion; RT retinal tear; SB scleral buckle; PPV pars plana vitrectomy; VH Vitreous hemorrhage; PRP panretinal laser photocoagulation; IVK intravitreal kenalog; VMT vitreomacular traction; MH Macular hole;  NVD neovascularization of the disc; NVE neovascularization elsewhere; AREDS age related eye disease study; ARMD age related macular degeneration; POAG primary open angle glaucoma; EBMD epithelial/anterior basement membrane dystrophy; ACIOL anterior chamber intraocular lens; IOL intraocular lens; PCIOL posterior chamber intraocular lens; Phaco/IOL phacoemulsification with intraocular lens placement; [place] photorefractive keratectomy; LASIK laser assisted in situ keratomileusis; HTN hypertension; DM diabetes mellitus; COPD chronic obstructive pulmonary disease

## 2019-10-27 NOTE — Assessment & Plan Note (Signed)
The nature of diabetic macular edema was discussed with the patient. Treatment options were outlined including medical therapy, laser & vitrectomy. The use of injectable medications reviewed, including Avastin, Lucentis, and Eylea. Periodic injections into the eye are likely to resolve diabetic macular edema (swelling in the center of vision). Initially, injections are delivered are delivered every 4-6 weeks, and the interval extended as the condition improves. On average, 8-9 injections the first year, and 5 in year 2. Improvement in the condition most often improves on medical therapy. Occasional use of focal laser is also recommended for residual macular edema (swelling). Excellent control of blood glucose and blood pressure are encouraged under the care of a primary physician or endocrinologist. Similarly, attempts to maintain serum cholesterol, low density lipoproteins, and high-density lipoproteins in a favorable range were recommended.   OS improved post Avastin No. 1 at 4-week interval

## 2019-10-27 NOTE — Assessment & Plan Note (Signed)
CSME OD, stable but not improved at 2-week interval post Avastin.

## 2019-11-09 ENCOUNTER — Ambulatory Visit: Payer: Self-pay | Admitting: Nurse Practitioner

## 2019-11-10 ENCOUNTER — Encounter: Payer: Self-pay | Admitting: Nurse Practitioner

## 2019-11-16 ENCOUNTER — Encounter: Payer: Self-pay | Admitting: Nurse Practitioner

## 2019-11-16 ENCOUNTER — Ambulatory Visit (INDEPENDENT_AMBULATORY_CARE_PROVIDER_SITE_OTHER): Payer: HMO | Admitting: Nurse Practitioner

## 2019-11-16 ENCOUNTER — Other Ambulatory Visit: Payer: Self-pay

## 2019-11-16 VITALS — BP 130/86 | HR 79 | Temp 98.2°F | Ht 62.4 in | Wt 156.0 lb

## 2019-11-16 DIAGNOSIS — Z0000 Encounter for general adult medical examination without abnormal findings: Secondary | ICD-10-CM | POA: Diagnosis not present

## 2019-11-16 DIAGNOSIS — Z79899 Other long term (current) drug therapy: Secondary | ICD-10-CM

## 2019-11-16 DIAGNOSIS — Z794 Long term (current) use of insulin: Secondary | ICD-10-CM | POA: Diagnosis not present

## 2019-11-16 DIAGNOSIS — E119 Type 2 diabetes mellitus without complications: Secondary | ICD-10-CM

## 2019-11-16 MED ORDER — ONETOUCH ULTRA MINI W/DEVICE KIT: 1.0000 | PACK | Freq: Three times a day (TID) | 0 refills | Status: AC

## 2019-11-16 MED ORDER — ONETOUCH ULTRASOFT LANCETS MISC: 3 refills | Status: DC

## 2019-11-16 MED ORDER — ONETOUCH VERIO VI STRP: ORAL_STRIP | 3 refills | Status: DC

## 2019-11-16 NOTE — Patient Instructions (Signed)
  Shannon [name] , Thank you for taking time to come for your Medicare Wellness Visit. I appreciate your ongoing commitment to your health goals. Please review the following plan we discussed and let me know if I can assist you in the future.   These are the goals we discussed: Goals    . Client understands the importance of follow-up with providers by attending scheduled visits     RNCM discussed importance of keeping follow visits with providers.     . Diabetes Patient stated goal: " I want to find a new eye doctor" (pt-stated)     Advised client to contact her health team advantage concierge and request a list of her health plan participating eye doctors. Advised client to contact new eye doctor once obtained and schedule appointment.     Marland Kitchen DIET - EAT MORE FRUITS AND VEGETABLES     "I plan to eat right and stay active"    . HEMOGLOBIN A1C < 7.0     Reviewed Hbg A1c targets and result of A1c done 06/01/19. Discussed strategies to improve Hgb A1c:  good medication taking behavior, Carbohydrate controlled meal planning, being active, and blood sugar monitoring    . Maintain timely refills of diabetic medication as prescribed within the year .     Take medications as prescribed by physician    . Obtain annual  Lipid Profile, LDL-C     Advised client to schedule annual physical with primary MD for 2021    . Obtain Annual Eye (retinal)  Exam      Contact health team advantage concierge to obtain in plan eye doctor    . Obtain Annual Foot Exam     Advised client to schedule annual physical with primary care provider for 2021    . Obtain annual screen for micro albuminuria (urine) , nephropathy (kidney problems)     Advised client to schedule annual physical with primary MD for 2021    . Obtain Hemoglobin A1C at least 2 times per year     Advised client to keep every 3-4 month follow up visits with provider    . Visit Primary Care Provider or Endocrinologist at least 2 times per year     [facility] discussed importance of keeping follow up appointments with providers.        This is a list of the screening recommended for you and due dates:  Health Maintenance  Topic Date Due  . DEXA scan (bone density measurement)  Never done  . Pneumonia vaccines (1 of 2 - PCV13) 05/24/2020*  . Flu Shot  01/24/2020  . Hemoglobin A1C  02/08/2020  . Complete foot exam   05/31/2020  . Eye exam for diabetics  10/26/2020  . Mammogram  11/03/2020  . Colon Cancer Screening  04/11/2023  . Tetanus Vaccine  11/29/2027  . COVID-19 Vaccine  Completed  .  Hepatitis C: One time screening is recommended by Center for Disease Control  (CDC) for  adults born from [birth year] through 1967.   Completed  . HIV Screening  Completed  . Pap Smear  Discontinued  *Topic was postponed. The date shown is not the original due date.

## 2019-11-16 NOTE — Progress Notes (Signed)
This visit occurred during the SARS-CoV-2 public health emergency.  Safety protocols were in place, including screening questions prior to the visit, additional usage of staff PPE, and extensive cleaning of exam room while observing appropriate contact time as indicated for disinfecting solutions.  Subjective:     Patient ID: Shannon [name] , female    DOB: 1955/09/12 , 64 y.o.   MRN: [id number]   Chief Complaint  Patient presents with  . Medicare Wellness    HPI  Wt Readings from Last 3 Encounters: 11/16/19 : 156 lb (70.8 kg) 08/11/19 : 162 lb 9.6 oz (73.8 kg) 06/01/19 : 167 lb 9.6 oz (76 kg)   Diabetes She presents for her follow-up diabetic visit. She has type 2 diabetes mellitus. Her disease course has been improving. There are no hypoglycemic associated symptoms. There are no diabetic associated symptoms. Symptoms are stable. Current diabetic treatment includes oral agent (dual therapy). She is compliant with treatment most of the time. She is following a diabetic diet. When asked about meal planning, she reported none. She has not had a previous visit with a dietitian. She participates in exercise daily (except for Sunday). Her home blood glucose trend is decreasing steadily. (Blood sugar highest has been 194 otherwise 120-130.  ) An ACE inhibitor/angiotensin II receptor blocker is being taken. She does not see a podiatrist.Eye exam is current.     Past Medical History:  Diagnosis Date  . Diabetes mellitus without complication ([place])   . Hypercholesteremia   . Hypertension      Family History  Problem Relation Age of Onset  . Hypertension Mother   . Diabetes Mother   . Diabetes Father   . Hypertension Father   . Hypertension Son   . Diabetes Son   . Breast cancer Neg Hx      Current Outpatient Medications:  .  acetaminophen (TYLENOL) 500 MG tablet, Take 500-1,000 mg by mouth every 6 (six) hours as needed for headache (or pain)., Disp: , Rfl:  .  Ascorbic Acid  (VITAMIN C) 1000 MG tablet, Take 1 tablet (1,000 mg total) by mouth daily. Please take for 2 weeks., Disp: , Rfl:  .  atorvastatin (LIPITOR) 20 MG tablet, Take 1 tablet (20 mg total) by mouth daily. Hold for next 2 weeks, Disp:  , Rfl:  .  Cholecalciferol (VITAMIN D-3 PO), Take 1 capsule by mouth daily., Disp: , Rfl:  .  insulin degludec (TRESIBA FLEXTOUCH) 100 UNIT/ML SOPN FlexTouch Pen, Inject 0.21 mLs (21 Units total) into the skin daily., Disp: 15 pen, Rfl: 1 .  lisinopril (ZESTRIL) 10 MG tablet, Take 1 tablet (10 mg total) by mouth daily., Disp: 90 tablet, Rfl: 0 .  metFORMIN (GLUCOPHAGE) 500 MG tablet, TAKE ONE TABLET BY MOUTH EVERY DAY WITH MORNING AND EVENING MEALS, Disp: 180 tablet, Rfl: 0 .  polyethylene glycol powder (GLYCOLAX/MIRALAX) 17 GM/SCOOP powder, polyethylene glycol 3350 17 gram oral powder packet  TAKE 1 PACKET (17G) BY MOUTH DAILY, Disp: , Rfl:  .  Semaglutide,0.25 or 0.5MG/DOS, (OZEMPIC, 0.25 OR 0.5 MG/DOSE,) 2 MG/1.5ML SOPN, Inject 0.5 mg into the skin once a week., Disp: 9.5 mL, Rfl: 1 .  SODIUM FLUORIDE 5000 SENSITIVE 1.1-5 % PSTE, USE 3 4 TIMES PER WEEK AT NIGHT BEFORE BED, Disp: , Rfl:  .  zinc sulfate (ZINC-220) 220 (50 Zn) MG capsule, Take 1 capsule (220 mg total) by mouth daily. Please take for 2 weeks, Disp: , Rfl:    No Known Allergies   Review  of Systems   Today's Vitals   11/16/19 1446  BP: 130/86  Pulse: 79  Temp: 98.2 F (36.8 C)  Weight: 156 lb (70.8 kg)  Height: 5' 2.4" (1.585 m)   Body mass index is 28.17 kg/m.   Objective:  Physical Exam Constitutional:      General: She is not in acute distress.    Appearance: Normal appearance. She is well-developed. She is obese.  Cardiovascular:     Rate and Rhythm: Normal rate and regular rhythm.     Pulses: Normal pulses.     Heart sounds: Normal heart sounds. No murmur.  Pulmonary:     Effort: Pulmonary effort is normal.     Breath sounds: Normal breath sounds.  Chest:     Chest wall: No  tenderness.  Musculoskeletal:        General: Normal range of motion.  Skin:    General: Skin is warm and dry.     Capillary Refill: Capillary refill takes less than 2 seconds.  Neurological:     General: No focal deficit present.     Mental Status: She is alert and oriented to person, place, and time.  Psychiatric:        Mood and Affect: Mood normal.        Behavior: Behavior normal.        Thought Content: Thought content normal.        Judgment: Judgment normal.         Assessment And Plan:     1. Encounter for Medicare annual wellness exam  See below for medicare annual wellness exam  2. Type 2 diabetes mellitus without complication, with long-term current use of insulin (HCC)  Chronic, improving  Discussed medication coverage, insurance states she is in doughnut hole and her Ozempic is expensive.  Will refer to [facility] with [place] with exercise and diet low in sugar and starches - Blood Glucose Monitoring Suppl (ONE TOUCH ULTRA MINI) w/Device KIT; 1 each by Does not apply route 4 (four) times daily -  before meals and at bedtime.  Dispense: 1 kit; Refill: 0 - glucose blood (ONETOUCH VERIO) test strip; Use as instructed  Dispense: 300 each; Refill: 3 - Lancets (ONETOUCH ULTRASOFT) lancets; Use as instructed  Dispense: 300 each; Refill: 3 - Lipid panel - CMP14+EGFR - Hemoglobin A1c  3. Other long term (current) drug therapy  - CBC - TSH   [name], FNP       THE PATIENT IS ENCOURAGED TO PRACTICE SOCIAL DISTANCING DUE TO THE COVID-19 PANDEMIC.     Subjective:    Shannon [name] is a 64 y.o. female who presents for a Welcome to Medicare exam.   Review of Systems  Cardiac Risk Factors include: diabetes mellitus      Objective:    Today's Vitals   11/16/19 1446  BP: 130/86  Pulse: 79  Temp: 98.2 F (36.8 C)  Weight: 156 lb (70.8 kg)  Height: 5' 2.4" (1.585 m)  Body mass index is 28.17 kg/m.  Medications Outpatient  Encounter Medications as of 11/16/2019  Medication Sig  . acetaminophen (TYLENOL) 500 MG tablet Take 500-1,000 mg by mouth every 6 (six) hours as needed for headache (or pain).  . Ascorbic Acid (VITAMIN C) 1000 MG tablet Take 1 tablet (1,000 mg total) by mouth daily. Please take for 2 weeks.  Marland Kitchen atorvastatin (LIPITOR) 20 MG tablet Take 1 tablet (20 mg total) by mouth daily. Hold for next 2 weeks  .  Cholecalciferol (VITAMIN D-3 PO) Take 1 capsule by mouth daily.  . insulin degludec (TRESIBA FLEXTOUCH) 100 UNIT/ML SOPN FlexTouch Pen Inject 0.21 mLs (21 Units total) into the skin daily.  Marland Kitchen lisinopril (ZESTRIL) 10 MG tablet Take 1 tablet (10 mg total) by mouth daily.  . metFORMIN (GLUCOPHAGE) 500 MG tablet TAKE ONE TABLET BY MOUTH EVERY DAY WITH MORNING AND EVENING MEALS  . polyethylene glycol powder (GLYCOLAX/MIRALAX) 17 GM/SCOOP powder polyethylene glycol 3350 17 gram oral powder packet  TAKE 1 PACKET (17G) BY MOUTH DAILY  . Semaglutide,0.25 or 0.5MG/DOS, (OZEMPIC, 0.25 OR 0.5 MG/DOSE,) 2 MG/1.5ML SOPN Inject 0.5 mg into the skin once a week.  . SODIUM FLUORIDE 5000 SENSITIVE 1.1-5 % PSTE USE 3 4 TIMES PER WEEK AT NIGHT BEFORE BED  . zinc sulfate (ZINC-220) 220 (50 Zn) MG capsule Take 1 capsule (220 mg total) by mouth daily. Please take for 2 weeks   No facility-administered encounter medications on file as of 11/16/2019.     History: Past Medical History:  Diagnosis Date  . Diabetes mellitus without complication ([place])   . Hypercholesteremia   . Hypertension    Past Surgical History:  Procedure Laterality Date  . ABDOMINAL HYSTERECTOMY    . CATARACT EXTRACTION Bilateral   . CESAREAN SECTION    . MYOMECTOMY      Family History  Problem Relation Age of Onset  . Hypertension Mother   . Diabetes Mother   . Diabetes Father   . Hypertension Father   . Hypertension Son   . Diabetes Son   . Breast cancer Neg Hx    Social History   Occupational History  . Not on file  Tobacco Use  .  Smoking status: Former Research scientist (life sciences)  . Smokeless tobacco: Never Used  . Tobacco comment: quit 10 yrs  Substance and Sexual Activity  . Alcohol use: Yes    Comment: rarely  . Drug use: Never  . Sexual activity: Not on file    Tobacco Counseling Counseling given: No Comment: quit 10 yrs   Immunizations and Health Maintenance Immunization History  Administered Date(s) Administered  . Influenza,inj,Quad PF,6+ Mos 04/18/2018  . Influenza-Unspecified 05/01/2019  . PFIZER SARS-COV-2 Vaccination 09/18/2019, 10/14/2019  . Pneumococcal Polysaccharide-23 05/30/2018  . Tdap 11/28/2017   Health Maintenance Due  Topic Date Due  . DEXA SCAN  Never done    Activities of Daily Living In your present state of health, do you have any difficulty performing the following activities: 11/16/2019 07/23/2019  Hearing? N N  Vision? N N  Difficulty concentrating or making decisions? N N  Walking or climbing stairs? N N  Dressing or bathing? N N  Doing errands, shopping? N N  Preparing Food and eating ? N N  Using the Toilet? N N  In the past six months, have you accidently leaked urine? N N  Do you have problems with loss of bowel control? N N  Managing your Medications? N N  Managing your Finances? N N  Housekeeping or managing your Housekeeping? N N  Some recent data might be hidden    Physical Exam  (optional), or other factors deemed appropriate based on the beneficiary's medical and social history and current clinical standards.  Advanced Directives: Does Patient Have a Medical Advance Directive?: No Would patient like information on creating a medical advance directive?: Yes (MAU/Ambulatory/Procedural Areas - Information given)    Assessment:    This is a routine wellness examination for this patient .  Vision/Hearing screen  Hearing Screening   125Hz  250Hz  500Hz  1000Hz  2000Hz  3000Hz  4000Hz  6000Hz  8000Hz   Right ear:   25 25 40  40    Left ear:   40 40 40  40      Visual Acuity  Screening   Right eye Left eye Both eyes  Without correction: 20/20 20/50 20/50   With correction:       Dietary issues and exercise activities discussed:  Current Exercise Habits: Home exercise routine(Tai Chi), Type of exercise: yoga;walking, Time (Minutes): > 60, Frequency (Times/Week): 5, Weekly Exercise (Minutes/Week): 0, Intensity: Mild, Exercise limited by: None identified  Goals    . Client understands the importance of follow-up with providers by attending scheduled visits     RNCM discussed importance of keeping follow visits with providers.     . Diabetes Patient stated goal: " I want to find a new eye doctor" (pt-stated)     Advised client to contact her health team advantage concierge and request a list of her health plan participating eye doctors. Advised client to contact new eye doctor once obtained and schedule appointment.     Marland Kitchen DIET - EAT MORE FRUITS AND VEGETABLES     "I plan to eat right and stay active"    . HEMOGLOBIN A1C < 7.0     Reviewed Hbg A1c targets and result of A1c done 06/01/19. Discussed strategies to improve Hgb A1c:  good medication taking behavior, Carbohydrate controlled meal planning, being active, and blood sugar monitoring    . Maintain timely refills of diabetic medication as prescribed within the year .     Take medications as prescribed by physician    . Obtain annual  Lipid Profile, LDL-C     Advised client to schedule annual physical with primary MD for 2021    . Obtain Annual Eye (retinal)  Exam      Contact health team advantage concierge to obtain in plan eye doctor    . Obtain Annual Foot Exam     Advised client to schedule annual physical with primary care provider for 2021    . Obtain annual screen for micro albuminuria (urine) , nephropathy (kidney problems)     Advised client to schedule annual physical with primary MD for 2021    . Obtain Hemoglobin A1C at least 2 times per year     Advised client to keep every 3-4 month  follow up visits with provider    . Visit Primary Care Provider or Endocrinologist at least 2 times per year      [facility] discussed importance of keeping follow up appointments with providers.       Depression Screen PHQ 2/9 Scores 11/16/2019 07/23/2019 06/01/2019 10/22/2018  PHQ - 2 Score 0 0 0 0  PHQ- 9 Score 0 - - -     Fall Risk Fall Risk  11/16/2019  Falls in the past year? 0  Number falls in past yr: 0  Injury with Fall? 0    Cognitive Function:     6CIT Screen 11/16/2019  What Year? 0 points  What month? 0 points  What time? 0 points  Count back from 20 0 points  Months in reverse 0 points  Repeat phrase 0 points  Total Score 0    Patient Care Team: Glendale Chard, MD as PCP - General (Internal Medicine) Dannielle Karvonen, RN as [place]:   Medicare Annual Wellness Initial  Pt's annual wellness exam was  performed and geriatric assessment reviewed.   Pt has no new identifiable wellness concerns at this time.   WIll obtain routine labs.    Behavior modifications discussed and diet history reviewed. Pt will continue to exercise regularly and modify diet, with low GI, plant based foods and decrease food intake of processed foods.   Recommend intake of daily multivitamin, Vitamin D, and calcium.  She is scheduled for a mammogram and bone density for preventive screenings, as well as recommend immunizations that include influenza (up to date) and TDAP (up to date)   I have personally reviewed and noted the following in the patient's chart:   . Medical and social history . Use of alcohol, tobacco or illicit drugs  . Current medications and supplements . Functional ability and status . Nutritional status . Physical activity . Advanced directives . List of other physicians . Hospitalizations, surgeries, and ER visits in previous 12 months . Vitals . Screenings to include cognitive, depression, and falls . Referrals and  appointments  In addition, I have reviewed and discussed with patient certain preventive protocols, quality metrics, and best practice recommendations. A written personalized care plan for preventive services as well as general preventive health recommendations were provided to patient.     [name], FNP 11/16/2019

## 2019-11-17 ENCOUNTER — Ambulatory Visit (INDEPENDENT_AMBULATORY_CARE_PROVIDER_SITE_OTHER): Payer: HMO | Admitting: Ophthalmology

## 2019-11-17 ENCOUNTER — Encounter (INDEPENDENT_AMBULATORY_CARE_PROVIDER_SITE_OTHER): Payer: Self-pay | Admitting: Ophthalmology

## 2019-11-17 DIAGNOSIS — E113311 Type 2 diabetes mellitus with moderate nonproliferative diabetic retinopathy with macular edema, right eye: Secondary | ICD-10-CM

## 2019-11-17 LAB — CMP14+EGFR
ALT: 17 IU/L (ref 0–32)
AST: 13 IU/L (ref 0–40)
Albumin/Globulin Ratio: 2 (ref 1.2–2.2)
Albumin: 4.7 g/dL (ref 3.8–4.8)
Alkaline Phosphatase: 91 IU/L (ref 48–121)
BUN/Creatinine Ratio: 32 — ABNORMAL HIGH (ref 12–28)
BUN: 20 mg/dL (ref 8–27)
Bilirubin Total: 0.5 mg/dL (ref 0.0–1.2)
CO2: 26 mmol/L (ref 20–29)
Calcium: 9.2 mg/dL (ref 8.7–10.3)
Chloride: 104 mmol/L (ref 96–106)
Creatinine, Ser: 0.62 mg/dL (ref 0.57–1.00)
GFR calc Af Amer: 109 mL/min/{1.73_m2} (ref >59)
GFR calc non Af Amer: 95 mL/min/{1.73_m2} (ref >59)
Globulin, Total: 2.4 g/dL (ref 1.5–4.5)
Glucose: 100 mg/dL — ABNORMAL HIGH (ref 65–99)
Potassium: 3.6 mmol/L (ref 3.5–5.2)
Sodium: 145 mmol/L — ABNORMAL HIGH (ref 134–144)
Total Protein: 7.1 g/dL (ref 6.0–8.5)

## 2019-11-17 LAB — LIPID PANEL
Chol/HDL Ratio: 3.7 ratio (ref 0.0–4.4)
Cholesterol, Total: 189 mg/dL (ref 100–199)
HDL: 51 mg/dL (ref >39)
LDL Chol Calc (NIH): 102 mg/dL — ABNORMAL HIGH (ref 0–99)
Triglycerides: 211 mg/dL — ABNORMAL HIGH (ref 0–149)
VLDL Cholesterol Cal: 36 mg/dL (ref 5–40)

## 2019-11-17 LAB — CBC
Hematocrit: 33.9 % — ABNORMAL LOW (ref 34.0–46.6)
Hemoglobin: 11.2 g/dL (ref 11.1–15.9)
MCH: 28.2 pg (ref 26.6–33.0)
MCHC: 33 g/dL (ref 31.5–35.7)
MCV: 85 fL (ref 79–97)
Platelets: 213 10*3/uL (ref 150–450)
RBC: 3.97 x10E6/uL (ref 3.77–5.28)
RDW: 13.1 % (ref 11.7–15.4)
WBC: 5.8 10*3/uL (ref 3.4–10.8)

## 2019-11-17 LAB — HEMOGLOBIN A1C
Est. average glucose Bld gHb Est-mCnc: 166 mg/dL
Hgb A1c MFr Bld: 7.4 % — ABNORMAL HIGH (ref 4.8–5.6)

## 2019-11-17 LAB — TSH: TSH: 1.7 u[IU]/mL (ref 0.450–4.500)

## 2019-11-17 MED ORDER — BEVACIZUMAB CHEMO INJECTION 1.25MG/0.05ML SYRINGE FOR KALEIDOSCOPE
1.2500 mg | INTRAVITREAL | Status: AC | PRN
  Administered 2019-11-17: 1.25 mg via INTRAVITREAL

## 2019-11-17 NOTE — Assessment & Plan Note (Signed)
Slight improvement on intravitreal Avastin, will repeat.  My impression that this may have some relationship also to MAC-TEL as the patient has some review of systems positive for sleep apnea. She reports interest in still being tested for sleep apnea just has not completed this to date.

## 2019-11-17 NOTE — Progress Notes (Signed)
11/17/2019     CHIEF COMPLAINT Patient presents for Retina Follow Up   HISTORY OF PRESENT ILLNESS: Shannon [name] is a 64 y.o. female who presents to the clinic today for:   HPI    Retina Follow Up    Patient presents with  Diabetic Retinopathy.  In right eye.  This started 6 weeks ago.  Severity is mild.  Duration of 6 weeks.  Since onset it is stable.          Comments    6 Week Diabetic Exam OD, poss Avastin OD  Pt reports fluctuating VA OD, but overall stable. Stable VA OS. No new symptoms reported.  LBS: 121 yesterday       Last edited by Rockie Neighbours, [place] on 11/17/2019 10:37 AM. (History)      Referring physician: Glendale Chard, [address] [place],  [place] [id number]  HISTORICAL INFORMATION:   Selected notes from the MEDICAL RECORD NUMBER    Lab Results  Component Value Date   HGBA1C 7.4 (H) 11/16/2019     CURRENT MEDICATIONS: No current outpatient medications on file. (Ophthalmic Drugs)   No current facility-administered medications for this visit. (Ophthalmic Drugs)   Current Outpatient Medications (Other)  Medication Sig  . acetaminophen (TYLENOL) 500 MG tablet Take 500-1,000 mg by mouth every 6 (six) hours as needed for headache (or pain).  . Ascorbic Acid (VITAMIN C) 1000 MG tablet Take 1 tablet (1,000 mg total) by mouth daily. Please take for 2 weeks.  Marland Kitchen atorvastatin (LIPITOR) 20 MG tablet Take 1 tablet (20 mg total) by mouth daily. Hold for next 2 weeks  . Blood Glucose Monitoring Suppl (ONE TOUCH ULTRA MINI) w/Device KIT 1 each by Does not apply route 4 (four) times daily -  before meals and at bedtime.  . Cholecalciferol (VITAMIN D-3 PO) Take 1 capsule by mouth daily.  Marland Kitchen glucose blood (ONETOUCH VERIO) test strip Use as instructed  . insulin degludec (TRESIBA FLEXTOUCH) 100 UNIT/ML SOPN FlexTouch Pen Inject 0.21 mLs (21 Units total) into the skin daily.  . Lancets (ONETOUCH ULTRASOFT) lancets Use as instructed  .  lisinopril (ZESTRIL) 10 MG tablet Take 1 tablet (10 mg total) by mouth daily.  . metFORMIN (GLUCOPHAGE) 500 MG tablet TAKE ONE TABLET BY MOUTH EVERY DAY WITH MORNING AND EVENING MEALS  . polyethylene glycol powder (GLYCOLAX/MIRALAX) 17 GM/SCOOP powder polyethylene glycol 3350 17 gram oral powder packet  TAKE 1 PACKET (17G) BY MOUTH DAILY  . Semaglutide,0.25 or 0.5MG/DOS, (OZEMPIC, 0.25 OR 0.5 MG/DOSE,) 2 MG/1.5ML SOPN Inject 0.5 mg into the skin once a week.  . SODIUM FLUORIDE 5000 SENSITIVE 1.1-5 % PSTE USE 3 4 TIMES PER WEEK AT NIGHT BEFORE BED  . zinc sulfate (ZINC-220) 220 (50 Zn) MG capsule Take 1 capsule (220 mg total) by mouth daily. Please take for 2 weeks   No current facility-administered medications for this visit. (Other)      REVIEW OF SYSTEMS:    ALLERGIES No Known Allergies  PAST MEDICAL HISTORY Past Medical History:  Diagnosis Date  . Diabetes mellitus without complication ([place])   . Hypercholesteremia   . Hypertension    Past Surgical History:  Procedure Laterality Date  . ABDOMINAL HYSTERECTOMY    . CATARACT EXTRACTION Bilateral   . CESAREAN SECTION    . MYOMECTOMY      FAMILY HISTORY Family History  Problem Relation Age of Onset  . Hypertension Mother   . Diabetes Mother   .  Diabetes Father   . Hypertension Father   . Hypertension Son   . Diabetes Son   . Breast cancer Neg Hx     SOCIAL HISTORY Social History   Tobacco Use  . Smoking status: Former Research scientist (life sciences)  . Smokeless tobacco: Never Used  . Tobacco comment: quit 10 yrs  Substance Use Topics  . Alcohol use: Yes    Comment: rarely  . Drug use: Never         OPHTHALMIC EXAM:  Base Eye Exam    Visual Acuity (ETDRS)      Right Left   [place] 20/25 +1 20/20 -1       Tonometry (Tonopen, 10:40 AM)      Right Left   Pressure 17 14       Pupils      Pupils Dark Light Shape React APD   Right PERRL 4 3 Round Brisk None   Left PERRL 4 3 Round Brisk None       Visual Fields  (Counting fingers)      Left Right    Full Full       Extraocular Movement      Right Left    Full Full       Neuro/Psych    Oriented x3: Yes   Mood/Affect: Normal       Dilation    Right eye: 1.0% Mydriacyl, 2.5% Phenylephrine @ 10:40 AM        Slit Lamp and Fundus Exam    External Exam      Right Left   External Normal Normal       Slit Lamp Exam      Right Left   Lids/Lashes Normal Normal   Conjunctiva/Sclera White and quiet White and quiet   Cornea Clear Clear   Anterior Chamber Deep and quiet Deep and quiet   Iris Round and reactive Round and reactive   Lens Posterior chamber intraocular lens Posterior chamber intraocular lens   Anterior Vitreous Normal Normal       Fundus Exam      Right Left   Posterior Vitreous Normal    Disc Normal    C/D Ratio 0.2    Macula Clinically significant macular edema, Exudates, Macular thickening    Vessels NPDR- Moderate    Periphery Normal           IMAGING AND PROCEDURES  Imaging and Procedures for 11/17/19  OCT, Retina - OU - Both Eyes       Right Eye Quality was good. Scan locations included subfoveal. Central Foveal Thickness: 312. Progression has improved. Findings include abnormal foveal contour.   Left Eye Quality was good. Scan locations included subfoveal. Central Foveal Thickness: 270. Findings include normal observations.   Notes OD, focal CSME superotemporal to the foveal avascular zone. Slight improvement in retinal thickening on intravitreal Avastin. We will repeat Avastin intravitreal OD today       Intravitreal Injection, Pharmacologic Agent - OD - Right Eye       Time Out 11/17/2019. 11:55 AM. Confirmed correct patient, procedure, site, and patient consented.   Anesthesia Topical anesthesia was used. Anesthetic medications included Akten 3.5%.   Procedure Preparation included Tobramycin 0.3%, 10% betadine to eyelids. A 30 gauge needle was used.   Injection:  1.25 mg Bevacizumab  (AVASTIN) SOLN   NDC: [address]-0, Lot: [id number]   Route: Intravitreal, Site: Right Eye, Waste: 0 mg  Post-op Post injection exam found visual acuity of at least counting  fingers. The patient tolerated the procedure well. There were no complications. The patient received written and verbal post procedure care education. Post injection medications were not given.                 ASSESSMENT/PLAN:  Moderate nonproliferative diabetic retinopathy of right eye with macular edema (HCC) Slight improvement on intravitreal Avastin, will repeat.  My impression that this may have some relationship also to MAC-TEL as the patient has some review of systems positive for sleep apnea. She reports interest in still being tested for sleep apnea just has not completed this to date.      ICD-10-CM   1. Moderate nonproliferative diabetic retinopathy of right eye with macular edema associated with type 2 diabetes mellitus (HCC)  E11.3311 OCT, Retina - OU - Both Eyes    Intravitreal Injection, Pharmacologic Agent - OD - Right Eye    Bevacizumab (AVASTIN) SOLN 1.25 mg    1.OD, focal CSME superotemporal to the foveal avascular zone. Slight improvement in retinal thickening on intravitreal Avastin. We will repeat Avastin intravitreal OD today  2.  3.  Ophthalmic Meds Ordered this visit:  Meds ordered this encounter  Medications  . Bevacizumab (AVASTIN) SOLN 1.25 mg       Return in about 6 weeks (around 12/29/2019) for AVASTIN OCT, OD.  There are no Patient Instructions on file for this visit.   Explained the diagnoses, plan, and follow up with the patient and they expressed understanding.  Patient expressed understanding of the importance of proper follow up care.   Clent Demark Rohaan Durnil M.D. Diseases & Surgery of the Retina and Vitreous Retina & Diabetic [place] 11/17/19     Abbreviations: M myopia (nearsighted); A astigmatism; H hyperopia (farsighted); P presbyopia; Mrx spectacle  prescription;  CTL contact lenses; OD right eye; OS left eye; OU both eyes  XT exotropia; ET esotropia; PEK punctate epithelial keratitis; PEE punctate epithelial erosions; DES dry eye syndrome; MGD meibomian gland dysfunction; ATs artificial tears; PFAT's preservative free artificial tears; [place] nuclear sclerotic cataract; PSC posterior subcapsular cataract; ERM epi-retinal membrane; PVD posterior vitreous detachment; RD retinal detachment; DM diabetes mellitus; DR diabetic retinopathy; NPDR non-proliferative diabetic retinopathy; PDR proliferative diabetic retinopathy; CSME clinically significant macular edema; DME diabetic macular edema; dbh dot blot hemorrhages; CWS cotton wool spot; POAG primary open angle glaucoma; C/D cup-to-disc ratio; HVF humphrey visual field; GVF goldmann visual field; OCT optical coherence tomography; IOP intraocular pressure; BRVO Branch retinal vein occlusion; CRVO central retinal vein occlusion; CRAO central retinal artery occlusion; BRAO branch retinal artery occlusion; RT retinal tear; SB scleral buckle; PPV pars plana vitrectomy; VH Vitreous hemorrhage; PRP panretinal laser photocoagulation; IVK intravitreal kenalog; VMT vitreomacular traction; MH Macular hole;  NVD neovascularization of the disc; NVE neovascularization elsewhere; AREDS age related eye disease study; ARMD age related macular degeneration; POAG primary open angle glaucoma; EBMD epithelial/anterior basement membrane dystrophy; ACIOL anterior chamber intraocular lens; IOL intraocular lens; PCIOL posterior chamber intraocular lens; Phaco/IOL phacoemulsification with intraocular lens placement; [place] photorefractive keratectomy; LASIK laser assisted in situ keratomileusis; HTN hypertension; DM diabetes mellitus; COPD chronic obstructive pulmonary disease

## 2019-11-20 ENCOUNTER — Other Ambulatory Visit: Payer: HMO

## 2019-11-20 ENCOUNTER — Ambulatory Visit: Payer: HMO

## 2019-11-24 ENCOUNTER — Other Ambulatory Visit: Payer: Self-pay

## 2019-11-24 DIAGNOSIS — E119 Type 2 diabetes mellitus without complications: Secondary | ICD-10-CM

## 2019-11-24 MED ORDER — ONETOUCH ULTRASOFT LANCETS MISC: 3 refills | Status: DC

## 2019-12-07 ENCOUNTER — Encounter: Payer: Self-pay | Admitting: Nurse Practitioner

## 2019-12-08 ENCOUNTER — Ambulatory Visit (INDEPENDENT_AMBULATORY_CARE_PROVIDER_SITE_OTHER): Payer: HMO | Admitting: Ophthalmology

## 2019-12-08 ENCOUNTER — Other Ambulatory Visit: Payer: Self-pay

## 2019-12-08 ENCOUNTER — Encounter (INDEPENDENT_AMBULATORY_CARE_PROVIDER_SITE_OTHER): Payer: Self-pay | Admitting: Ophthalmology

## 2019-12-08 DIAGNOSIS — E113312 Type 2 diabetes mellitus with moderate nonproliferative diabetic retinopathy with macular edema, left eye: Secondary | ICD-10-CM | POA: Diagnosis not present

## 2019-12-08 DIAGNOSIS — E113311 Type 2 diabetes mellitus with moderate nonproliferative diabetic retinopathy with macular edema, right eye: Secondary | ICD-10-CM | POA: Diagnosis not present

## 2019-12-08 MED ORDER — BEVACIZUMAB CHEMO INJECTION 1.25MG/0.05ML SYRINGE FOR KALEIDOSCOPE
1.2500 mg | INTRAVITREAL | Status: AC | PRN
  Administered 2019-12-08: 1.25 mg via INTRAVITREAL

## 2019-12-08 NOTE — Assessment & Plan Note (Signed)
OS improved overall with diabetic macular edema on intravitreal Avastin.

## 2019-12-08 NOTE — Assessment & Plan Note (Signed)
IMProved some 2 weeks after recent intravitreal Avastin in the temporal MAC-TEL, CME may still be present

## 2019-12-08 NOTE — Progress Notes (Signed)
12/08/2019     CHIEF COMPLAINT Patient presents for Retina Follow Up   HISTORY OF PRESENT ILLNESS: Shannon [name] is a 64 y.o. female who presents to the clinic today for:   HPI    Retina Follow Up    Patient presents with  Diabetic Retinopathy.  In left eye.  Duration of 6 weeks.  Since onset it is stable.          Comments    6 wk follow up - OCT OU, Poss Avastin OS Patient denies change in vision and overall has no complaints.        Last edited by Gerda Diss on 12/08/2019  9:54 AM. (History)      Referring physician: Glendale Chard, MD [address] [place],  [place] [address]  HISTORICAL INFORMATION:   Selected notes from the MEDICAL RECORD NUMBER    Lab Results  Component Value Date   HGBA1C 7.4 (H) 11/16/2019     CURRENT MEDICATIONS: No current outpatient medications on file. (Ophthalmic Drugs)   No current facility-administered medications for this visit. (Ophthalmic Drugs)   Current Outpatient Medications (Other)  Medication Sig  . acetaminophen (TYLENOL) 500 MG tablet Take 500-1,000 mg by mouth every 6 (six) hours as needed for headache (or pain).  . Ascorbic Acid (VITAMIN C) 1000 MG tablet Take 1 tablet (1,000 mg total) by mouth daily. Please take for 2 weeks.  Marland Kitchen atorvastatin (LIPITOR) 20 MG tablet Take 1 tablet (20 mg total) by mouth daily. Hold for next 2 weeks  . Blood Glucose Monitoring Suppl (ONE TOUCH ULTRA MINI) w/Device KIT 1 each by Does not apply route 4 (four) times daily -  before meals and at bedtime.  . Cholecalciferol (VITAMIN D-3 PO) Take 1 capsule by mouth daily.  Marland Kitchen glucose blood (ONETOUCH VERIO) test strip Use as instructed  . insulin degludec (TRESIBA FLEXTOUCH) 100 UNIT/ML SOPN FlexTouch Pen Inject 0.21 mLs (21 Units total) into the skin daily.  . Lancets (ONETOUCH ULTRASOFT) lancets Use as instructed to check blood sugars 4 times daily E11.9  . lisinopril (ZESTRIL) 10 MG tablet Take 1 tablet (10 mg total) by  mouth daily.  . metFORMIN (GLUCOPHAGE) 500 MG tablet TAKE ONE TABLET BY MOUTH EVERY DAY WITH MORNING AND EVENING MEALS  . polyethylene glycol powder (GLYCOLAX/MIRALAX) 17 GM/SCOOP powder polyethylene glycol 3350 17 gram oral powder packet  TAKE 1 PACKET (17G) BY MOUTH DAILY  . Semaglutide,0.25 or 0.5MG/DOS, (OZEMPIC, 0.25 OR 0.5 MG/DOSE,) 2 MG/1.5ML SOPN Inject 0.5 mg into the skin once a week.  . SODIUM FLUORIDE 5000 SENSITIVE 1.1-5 % PSTE USE 3 4 TIMES PER WEEK AT NIGHT BEFORE BED  . zinc sulfate (ZINC-220) 220 (50 Zn) MG capsule Take 1 capsule (220 mg total) by mouth daily. Please take for 2 weeks   No current facility-administered medications for this visit. (Other)      REVIEW OF SYSTEMS:    ALLERGIES No Known Allergies  PAST MEDICAL HISTORY Past Medical History:  Diagnosis Date  . Diabetes mellitus without complication ([place])   . Hypercholesteremia   . Hypertension    Past Surgical History:  Procedure Laterality Date  . ABDOMINAL HYSTERECTOMY    . CATARACT EXTRACTION Bilateral   . CESAREAN SECTION    . MYOMECTOMY      FAMILY HISTORY Family History  Problem Relation Age of Onset  . Hypertension Mother   . Diabetes Mother   . Diabetes Father   . Hypertension Father   .  Hypertension Son   . Diabetes Son   . Breast cancer Neg Hx     SOCIAL HISTORY Social History   Tobacco Use  . Smoking status: Former Research scientist (life sciences)  . Smokeless tobacco: Never Used  . Tobacco comment: quit 10 yrs  Vaping Use  . Vaping Use: Never used  Substance Use Topics  . Alcohol use: Yes    Comment: rarely  . Drug use: Never         OPHTHALMIC EXAM:  Base Eye Exam    Visual Acuity (Snellen - Linear)      Right Left   Dist [place] 20/25-2 20/25+2       Tonometry (Tonopen, 10:04 AM)      Right Left   Pressure 14 13       Pupils      Pupils Dark Light Shape React APD   Right PERRL 4 3 Round Brisk None   Left PERRL 4 3 Round Brisk None       Visual Fields (Counting fingers)       Left Right    Full Full       Extraocular Movement      Right Left    Full Full       Neuro/Psych    Oriented x3: Yes   Mood/Affect: Normal       Dilation    Left eye: 1.0% Mydriacyl, 2.5% Phenylephrine @ 10:04 AM        Slit Lamp and Fundus Exam    External Exam      Right Left   External Normal Normal       Slit Lamp Exam      Right Left   Lids/Lashes Normal Normal   Conjunctiva/Sclera White and quiet White and quiet   Cornea Clear Clear   Anterior Chamber Deep and quiet Deep and quiet   Iris Round and reactive Round and reactive   Lens Posterior chamber intraocular lens Posterior chamber intraocular lens   Anterior Vitreous Normal Normal       Fundus Exam      Right Left   Posterior Vitreous  Normal   Disc  Normal   C/D Ratio  0.2   Macula  Microaneurysms, Mild clinically significant macular edema   Vessels  NPDR- Moderate   Periphery  Normal          IMAGING AND PROCEDURES  Imaging and Procedures for 12/08/19  OCT, Retina - OU - Both Eyes       Right Eye Quality was good. Scan locations included subfoveal. Central Foveal Thickness: 293. Progression has improved.   Left Eye Quality was good. Scan locations included subfoveal. Central Foveal Thickness: 276. Progression has improved.   Notes OD improved CSME some  2 weeks after Avastin.  Will continue to monitor for for resolution.  Persistent CME on the temporal aspect does suggest possible MAC-TEL component       Intravitreal Injection, Pharmacologic Agent - OS - Left Eye       Time Out 12/08/2019. 11:01 AM. Confirmed correct patient, procedure, site, and patient consented.   Anesthesia Topical anesthesia was used. Anesthetic medications included Ak[place] 3.5%.   Procedure Preparation included 10% betadine to eyelids, Tobramycin 0.3%. A 30 gauge needle was used.   Injection:  1.25 mg Bevacizumab (AVASTIN) SOLN   NDC: [address]-9, Lot: [id number]   Route: Intravitreal, Site: Left Eye,  Waste: 0 mg  Post-op Post injection exam found visual acuity of at least counting fingers. The patient  tolerated the procedure well. There were no complications. The patient received written and verbal post procedure care education. Post injection medications were not given.                 ASSESSMENT/PLAN:  Moderate nonproliferative diabetic retinopathy of left eye with macular edema associated with type 2 diabetes mellitus (HCC) OS improved overall with diabetic macular edema on intravitreal Avastin.  Moderate nonproliferative diabetic retinopathy of right eye with macular edema (HCC) IMProved some 2 weeks after recent intravitreal Avastin in the temporal MAC-TEL, CME may still be present      ICD-10-CM   1. Moderate nonproliferative diabetic retinopathy of left eye with macular edema associated with type 2 diabetes mellitus (HCC)  M27.0786 OCT, Retina - OU - Both Eyes    Intravitreal Injection, Pharmacologic Agent - OS - Left Eye    Bevacizumab (AVASTIN) SOLN 1.25 mg  2. Moderate nonproliferative diabetic retinopathy of right eye with macular edema associated with type 2 diabetes mellitus ([place])  E11.3311     1.  2.  3.  Ophthalmic Meds Ordered this visit:  Meds ordered this encounter  Medications  . Bevacizumab (AVASTIN) SOLN 1.25 mg       Return in about 6 weeks (around 01/19/2020) for AVASTIN OCT, OS.  There are no Patient Instructions on file for this visit.   Explained the diagnoses, plan, and follow up with the patient and they expressed understanding.  Patient expressed understanding of the importance of proper follow up care.   Clent Demark Krishana Lutze M.D. Diseases & Surgery of the Retina and Vitreous [facility]ina [facility] Diabetic [place] 12/08/19     Abbreviations: M myopia (nearsighted); A astigmatism; H hyperopia (farsighted); P presbyopia; Mrx spectacle prescription;  CTL contact lenses; OD right eye; OS left eye; OU both eyes  XT exotropia; ET esotropia; PEK  punctate epithelial keratitis; PEE punctate epithelial erosions; DES dry eye syndrome; MGD meibomian gland dysfunction; ATs artificial tears; PFAT's preservative free artificial tears; [place] nuclear sclerotic cataract; PSC posterior subcapsular cataract; ERM epi-retinal membrane; PVD posterior vitreous detachment; RD retinal detachment; DM diabetes mellitus; DR diabetic retinopathy; NPDR non-proliferative diabetic retinopathy; PDR proliferative diabetic retinopathy; CSME clinically significant macular edema; DME diabetic macular edema; dbh dot blot hemorrhages; CWS cotton wool spot; POAG primary open angle glaucoma; C/D cup-to-disc ratio; HVF humphrey visual field; GVF goldmann visual field; OCT optical coherence tomography; IOP intraocular pressure; BRVO Branch retinal vein occlusion; CRVO central retinal vein occlusion; CRAO central retinal artery occlusion; BRAO branch retinal artery occlusion; RT retinal tear; SB scleral buckle; PPV pars plana vitrectomy; VH Vitreous hemorrhage; PRP panretinal laser photocoagulation; IVK intravitreal kenalog; VMT vitreomacular traction; MH Macular hole;  NVD neovascularization of the disc; NVE neovascularization elsewhere; AREDS age related eye disease study; ARMD age related macular degeneration; POAG primary open angle glaucoma; EBMD epithelial/anterior basement membrane dystrophy; ACIOL anterior chamber intraocular lens; IOL intraocular lens; PCIOL posterior chamber intraocular lens; Phaco/IOL phacoemulsification with intraocular lens placement; [place] photorefractive keratectomy; LASIK laser assisted in situ keratomileusis; HTN hypertension; DM diabetes mellitus; COPD chronic obstructive pulmonary disease

## 2019-12-09 ENCOUNTER — Ambulatory Visit
Admission: RE | Admit: 2019-12-09 | Discharge: 2019-12-09 | Disposition: A | Discharge status: Home / Self Care | Payer: HMO | Source: Ambulatory Visit | Attending: Nurse Practitioner | Admitting: Nurse Practitioner

## 2019-12-09 ENCOUNTER — Telehealth: Payer: Self-pay | Admitting: Nurse Practitioner

## 2019-12-09 DIAGNOSIS — Z1231 Encounter for screening mammogram for malignant neoplasm of breast: Secondary | ICD-10-CM

## 2019-12-09 DIAGNOSIS — M85851 Other specified disorders of bone density and structure, right thigh: Secondary | ICD-10-CM | POA: Diagnosis not present

## 2019-12-09 DIAGNOSIS — E2839 Other primary ovarian failure: Secondary | ICD-10-CM

## 2019-12-09 DIAGNOSIS — Z780 Asymptomatic menopausal state: Secondary | ICD-10-CM | POA: Diagnosis not present

## 2019-12-09 NOTE — Chronic Care Management (AMB) (Signed)
  Chronic Care Management   Note  12/09/2019 Name: Shannon [name] MRN: [id number] DOB: 1956-04-11  Shannon [name] is a 64 y.o. year old female who is a primary care patient of [name], [facility]. I reached out to Oneal Deputy by phone today in response to a referral sent by Ms. Ulyssa Ann-Giles [name]'s health plan.     Shannon [name] was given information about Chronic Care Management services today including:  1. CCM service includes personalized support from designated clinical staff supervised by her physician, including individualized plan of care and coordination with other care providers 2. 24/7 contact phone numbers for assistance for urgent and routine care needs. 3. Service will only be billed when office clinical staff spend 20 minutes or more in a month to coordinate care. 4. Only one practitioner may furnish and bill the service in a calendar month. 5. The patient may stop CCM services at any time (effective at the end of the month) by phone call to the office staff. 6. The patient will be responsible for cost sharing (co-pay) of up to 20% of the service fee (after annual deductible is met).  Patient agreed to services and verbal consent obtained.   Follow up plan: Telephone appointment with care management team member scheduled for:01/06/2020  [place], [place], [place] [address] Direct Dial: [email or web address] Website: [email or web address]

## 2019-12-15 ENCOUNTER — Ambulatory Visit (INDEPENDENT_AMBULATORY_CARE_PROVIDER_SITE_OTHER): Payer: HMO | Admitting: Nurse Practitioner

## 2019-12-15 ENCOUNTER — Other Ambulatory Visit: Payer: Self-pay

## 2019-12-15 VITALS — BP 130/72 | HR 68 | Temp 98.2°F | Ht 62.4 in | Wt 153.6 lb

## 2019-12-15 DIAGNOSIS — R0683 Snoring: Secondary | ICD-10-CM | POA: Diagnosis not present

## 2019-12-15 DIAGNOSIS — E119 Type 2 diabetes mellitus without complications: Secondary | ICD-10-CM

## 2019-12-15 DIAGNOSIS — Z794 Long term (current) use of insulin: Secondary | ICD-10-CM | POA: Diagnosis not present

## 2019-12-15 DIAGNOSIS — E113311 Type 2 diabetes mellitus with moderate nonproliferative diabetic retinopathy with macular edema, right eye: Secondary | ICD-10-CM | POA: Diagnosis not present

## 2019-12-15 NOTE — Progress Notes (Signed)
This visit occurred during the SARS-CoV-2 public health emergency.  Safety protocols were in place, including screening questions prior to the visit, additional usage of staff PPE, and extensive cleaning of exam room while observing appropriate contact time as indicated for disinfecting solutions.  Subjective:     Patient ID: Shannon [name] , female    DOB: 1955-07-02 , 64 y.o.   MRN: [id number]   Chief Complaint  Patient presents with  . Diabetes  . consultation    patient stated she needs a sleep study     HPI  Here for referral for a sleep study.  Dr. Zadie Rhine is concern that she has sleep apnea because the treatment he is providing is not improving her eyes.  Snores when she is tired. Does fall asleep easily when watching tv.  She does work 3rd shift - she is not working more than 40 hours per week.  Does not recall awakening at night coughing or choking.      Past Medical History:  Diagnosis Date  . Diabetes mellitus without complication ([place])   . Hypercholesteremia   . Hypertension      Family History  Problem Relation Age of Onset  . Hypertension Mother   . Diabetes Mother   . Diabetes Father   . Hypertension Father   . Hypertension Son   . Diabetes Son   . Breast cancer Neg Hx      Current Outpatient Medications:  .  acetaminophen (TYLENOL) 500 MG tablet, Take 500-1,000 mg by mouth every 6 (six) hours as needed for headache (or pain)., Disp: , Rfl:  .  Ascorbic Acid (VITAMIN C) 1000 MG tablet, Take 1 tablet (1,000 mg total) by mouth daily. Please take for 2 weeks., Disp: , Rfl:  .  atorvastatin (LIPITOR) 20 MG tablet, Take 1 tablet (20 mg total) by mouth daily. Hold for next 2 weeks, Disp:  , Rfl:  .  Blood Glucose Monitoring Suppl (ONE TOUCH ULTRA MINI) w/Device KIT, 1 each by Does not apply route 4 (four) times daily -  before meals and at bedtime., Disp: 1 kit, Rfl: 0 .  Cholecalciferol (VITAMIN D-3 PO), Take 1 capsule by mouth daily., Disp: , Rfl:  .   glucose blood (ONETOUCH VERIO) test strip, Use as instructed, Disp: 300 each, Rfl: 3 .  insulin degludec (TRESIBA FLEXTOUCH) 100 UNIT/ML SOPN FlexTouch Pen, Inject 0.21 mLs (21 Units total) into the skin daily., Disp: 15 pen, Rfl: 1 .  Lancets (ONETOUCH ULTRASOFT) lancets, Use as instructed to check blood sugars 4 times daily E11.9, Disp: 300 each, Rfl: 3 .  lisinopril (ZESTRIL) 10 MG tablet, Take 1 tablet (10 mg total) by mouth daily., Disp: 90 tablet, Rfl: 0 .  metFORMIN (GLUCOPHAGE) 500 MG tablet, TAKE ONE TABLET BY MOUTH EVERY DAY WITH MORNING AND EVENING MEALS, Disp: 180 tablet, Rfl: 0 .  polyethylene glycol powder (GLYCOLAX/MIRALAX) 17 GM/SCOOP powder, polyethylene glycol 3350 17 gram oral powder packet  TAKE 1 PACKET (17G) BY MOUTH DAILY, Disp: , Rfl:  .  Semaglutide,0.25 or 0.5MG/DOS, (OZEMPIC, 0.25 OR 0.5 MG/DOSE,) 2 MG/1.5ML SOPN, Inject 0.5 mg into the skin once a week., Disp: 9.5 mL, Rfl: 1 .  zinc sulfate (ZINC-220) 220 (50 Zn) MG capsule, Take 1 capsule (220 mg total) by mouth daily. Please take for 2 weeks, Disp: , Rfl:    No Known Allergies   Review of Systems  Constitutional: Negative.   Respiratory: Negative.  Negative for cough.   Cardiovascular: Negative.  Neurological: Negative for dizziness and headaches.  Psychiatric/Behavioral: Negative.      Today's Vitals   12/15/19 1453  BP: 130/72  Pulse: 68  Temp: 98.2 F (36.8 C)  TempSrc: Oral  Weight: 153 lb 9.6 oz (69.7 kg)  Height: 5' 2.4" (1.585 m)  PainSc: 0-No pain   Body mass index is 27.73 kg/m.   Objective:  Physical Exam Constitutional:      General: She is not in acute distress.    Appearance: Normal appearance. She is obese.  Skin:    Capillary Refill: Capillary refill takes less than 2 seconds.  Neurological:     General: No focal deficit present.     Mental Status: She is alert and oriented to person, place, and time.  Psychiatric:        Mood and Affect: Mood normal.        Behavior: Behavior  normal.        Thought Content: Thought content normal.        Judgment: Judgment normal.         Assessment And Plan:      1. Type 2 diabetes mellitus without complication, with long-term current use of insulin (HCC)  Chronic, she is improving  Continue with current medications  Will check with pharmacist to see if can do patient assistance - Ambulatory referral to Sleep Studies  2. Moderate nonproliferative diabetic retinopathy of right eye with macular edema associated with type 2 diabetes mellitus ([place])  Continue visits with opthalmology - Ambulatory referral to Sleep Studies  3. Snoring  Will refer her for a sleep study  Discussed risk for snoring and sleep apnea and increase risk for cardiac visits and diabetes.       [name], FNP    THE PATIENT IS ENCOURAGED TO PRACTICE SOCIAL DISTANCING DUE TO THE COVID-19 PANDEMIC.

## 2019-12-18 ENCOUNTER — Encounter: Payer: Self-pay | Admitting: Nurse Practitioner

## 2019-12-29 ENCOUNTER — Other Ambulatory Visit: Payer: Self-pay

## 2019-12-29 ENCOUNTER — Ambulatory Visit (INDEPENDENT_AMBULATORY_CARE_PROVIDER_SITE_OTHER): Payer: HMO | Admitting: Ophthalmology

## 2019-12-29 ENCOUNTER — Encounter (INDEPENDENT_AMBULATORY_CARE_PROVIDER_SITE_OTHER): Payer: Self-pay | Admitting: Ophthalmology

## 2019-12-29 DIAGNOSIS — E113312 Type 2 diabetes mellitus with moderate nonproliferative diabetic retinopathy with macular edema, left eye: Secondary | ICD-10-CM

## 2019-12-29 DIAGNOSIS — E113311 Type 2 diabetes mellitus with moderate nonproliferative diabetic retinopathy with macular edema, right eye: Secondary | ICD-10-CM

## 2019-12-29 DIAGNOSIS — H43392 Other vitreous opacities, left eye: Secondary | ICD-10-CM

## 2019-12-29 MED ORDER — BEVACIZUMAB CHEMO INJECTION 1.25MG/0.05ML SYRINGE FOR KALEIDOSCOPE
1.2500 mg | INTRAVITREAL | Status: AC | PRN
  Administered 2019-12-29: 1.25 mg via INTRAVITREAL

## 2019-12-29 NOTE — Assessment & Plan Note (Signed)
CSME OD persists and will be retreated today.

## 2019-12-29 NOTE — Progress Notes (Signed)
12/29/2019     CHIEF COMPLAINT Patient presents for Retina Follow Up   HISTORY OF PRESENT ILLNESS: Shannon [name] is a 64 y.o. female who presents to the clinic today for:   HPI    Retina Follow Up    Patient presents with  Diabetic Retinopathy.  In right eye.  This started 6 weeks ago.  Severity is mild.  Duration of 6 weeks.  Since onset it is stable.          Comments    6 Week Diabetic F/U OD, poss Avastin OD  Pt reports stable VA OU. Pt c/o spiderweb OS since last injection. LBS: 177 this AM       Last edited by Rockie Neighbours, [place] on 12/29/2019  9:55 AM. (History)      Referring physician: Glendale Chard, [address] [place],  [place] [address]  HISTORICAL INFORMATION:   Selected notes from the MEDICAL RECORD NUMBER    Lab Results  Component Value Date   HGBA1C 7.4 (H) 11/16/2019     CURRENT MEDICATIONS: No current outpatient medications on file. (Ophthalmic Drugs)   No current facility-administered medications for this visit. (Ophthalmic Drugs)   Current Outpatient Medications (Other)  Medication Sig  . acetaminophen (TYLENOL) 500 MG tablet Take 500-1,000 mg by mouth every 6 (six) hours as needed for headache (or pain).  . Ascorbic Acid (VITAMIN C) 1000 MG tablet Take 1 tablet (1,000 mg total) by mouth daily. Please take for 2 weeks.  Marland Kitchen atorvastatin (LIPITOR) 20 MG tablet Take 1 tablet (20 mg total) by mouth daily. Hold for next 2 weeks  . Blood Glucose Monitoring Suppl (ONE TOUCH ULTRA MINI) w/Device KIT 1 each by Does not apply route 4 (four) times daily -  before meals and at bedtime.  . Cholecalciferol (VITAMIN D-3 PO) Take 1 capsule by mouth daily.  Marland Kitchen glucose blood (ONETOUCH VERIO) test strip Use as instructed  . insulin degludec (TRESIBA FLEXTOUCH) 100 UNIT/ML SOPN FlexTouch Pen Inject 0.21 mLs (21 Units total) into the skin daily.  . Lancets (ONETOUCH ULTRASOFT) lancets Use as instructed to check blood sugars 4 times daily  E11.9  . lisinopril (ZESTRIL) 10 MG tablet Take 1 tablet (10 mg total) by mouth daily.  . metFORMIN (GLUCOPHAGE) 500 MG tablet TAKE ONE TABLET BY MOUTH EVERY DAY WITH MORNING AND EVENING MEALS  . polyethylene glycol powder (GLYCOLAX/MIRALAX) 17 GM/SCOOP powder polyethylene glycol 3350 17 gram oral powder packet  TAKE 1 PACKET (17G) BY MOUTH DAILY  . Semaglutide,0.25 or 0.5MG/DOS, (OZEMPIC, 0.25 OR 0.5 MG/DOSE,) 2 MG/1.5ML SOPN Inject 0.5 mg into the skin once a week.  . zinc sulfate (ZINC-220) 220 (50 Zn) MG capsule Take 1 capsule (220 mg total) by mouth daily. Please take for 2 weeks   No current facility-administered medications for this visit. (Other)      REVIEW OF SYSTEMS:    ALLERGIES No Known Allergies  PAST MEDICAL HISTORY Past Medical History:  Diagnosis Date  . Diabetes mellitus without complication ([place])   . Hypercholesteremia   . Hypertension    Past Surgical History:  Procedure Laterality Date  . ABDOMINAL HYSTERECTOMY    . CATARACT EXTRACTION Bilateral   . CESAREAN SECTION    . MYOMECTOMY      FAMILY HISTORY Family History  Problem Relation Age of Onset  . Hypertension Mother   . Diabetes Mother   . Diabetes Father   . Hypertension Father   . Hypertension Son   .  Diabetes Son   . Breast cancer Neg Hx     SOCIAL HISTORY Social History   Tobacco Use  . Smoking status: Former Research scientist (life sciences)  . Smokeless tobacco: Never Used  . Tobacco comment: quit 10 yrs  Vaping Use  . Vaping Use: Never used  Substance Use Topics  . Alcohol use: Yes    Comment: rarely  . Drug use: Never         OPHTHALMIC EXAM:  Base Eye Exam    Visual Acuity (ETDRS)      Right Left   Dist [place] 20/20 -2 20/20 -1       Tonometry (Tonopen, 9:58 AM)      Right Left   Pressure 18 13       Pupils      Pupils Dark Light Shape React APD   Right PERRL 4 3 Round Brisk None   Left PERRL 4 3 Round Brisk None       Visual Fields (Counting fingers)      Left Right    Full Full        Extraocular Movement      Right Left    Full Full       Neuro/Psych    Oriented x3: Yes   Mood/Affect: Normal       Dilation    Both eyes: 1.0% Mydriacyl, 2.5% Phenylephrine @ 9:58 AM        Slit Lamp and Fundus Exam    External Exam      Right Left   External Normal Normal       Slit Lamp Exam      Right Left   Lids/Lashes Normal Normal   Conjunctiva/Sclera White and quiet White and quiet   Cornea Clear Clear   Anterior Chamber Deep and quiet Deep and quiet   Iris Round and reactive Round and reactive   Lens Posterior chamber intraocular lens Posterior chamber intraocular lens   Anterior Vitreous Normal Normal       Fundus Exam      Right Left   Posterior Vitreous Normal Normal, small vitreous debris, no cells, no overt PVD   Disc Normal Normal   C/D Ratio 0.2 0.2   Macula Clinically significant macular edema, Exudates, Macular thickening Microaneurysms, no detectable clinically significant macular edema   Vessels NPDR- Moderate NPDR- Moderate   Periphery Normal Normal          IMAGING AND PROCEDURES  Imaging and Procedures for 12/29/19  OCT, Retina - OU - Both Eyes       Right Eye Quality was good. Scan locations included subfoveal. Central Foveal Thickness: 315. Progression has been stable. Findings include abnormal foveal contour, cystoid macular edema.   Left Eye Quality was good. Scan locations included subfoveal. Central Foveal Thickness: 277. Progression has improved. Findings include normal observations.   Notes CSME OD, stable overall.  We will repeat intravitreal Avastin today       Intravitreal Injection, Pharmacologic Agent - OD - Right Eye       Time Out 12/29/2019. 10:41 AM. Confirmed correct patient, procedure, site, and patient consented.   Anesthesia Topical anesthesia was used. Anesthetic medications included [place] 3.5%.   Procedure Preparation included Tobramycin 0.3%, 10% betadine to eyelids. A 30 gauge needle was  used.   Injection:  1.25 mg Bevacizumab (AVASTIN) SOLN   NDC: [address]-6, Lot: [id number]   Route: Intravitreal, Site: Right Eye, Waste: 0 mg  Post-op Post injection exam found visual acuity of  at least counting fingers. The patient tolerated the procedure well. There were no complications. The patient received written and verbal post procedure care education. Post injection medications were not given.                 ASSESSMENT/PLAN:  Moderate nonproliferative diabetic retinopathy of left eye with macular edema associated with type 2 diabetes mellitus (HCC) OS, condition appears resolved on today's examination will observe henceforth  Moderate nonproliferative diabetic retinopathy of right eye with macular edema (HCC) CSME OD persists and will be retreated today.      ICD-10-CM   1. Moderate nonproliferative diabetic retinopathy of right eye with macular edema associated with type 2 diabetes mellitus (HCC)  E11.3311 OCT, Retina - OU - Both Eyes    Intravitreal Injection, Pharmacologic Agent - OD - Right Eye    Bevacizumab (AVASTIN) SOLN 1.25 mg  2. Moderate nonproliferative diabetic retinopathy of left eye with macular edema associated with type 2 diabetes mellitus ([place])  R91.6384   3. Vitreous floaters of left eye  H43.392     1.  Intravitreal Avastin OD today repeat  2.  OS with minor floaters, no overt history of vitreous detachment.  No vitreous hemorrhage.  Furthermore, CSME OS has resolved  3.  Patient has pending sleep apnea evaluation appointment  Ophthalmic Meds Ordered this visit:  Meds ordered this encounter  Medications  . Bevacizumab (AVASTIN) SOLN 1.25 mg       Return in about 6 weeks (around 02/09/2020) for dilate, OD, AVASTIN OCT.  There are no Patient Instructions on file for this visit.   Explained the diagnoses, plan, and follow up with the patient and they expressed understanding.  Patient expressed understanding of the importance of proper  follow up care.   Clent Demark Feven Alderfer M.D. Diseases & Surgery of the Retina and Vitreous [facility] [place] 12/29/19     Abbreviations: M myopia (nearsighted); A astigmatism; H hyperopia (farsighted); P presbyopia; Mrx spectacle prescription;  CTL contact lenses; OD right eye; OS left eye; OU both eyes  XT exotropia; ET esotropia; PEK punctate epithelial keratitis; PEE punctate epithelial erosions; DES dry eye syndrome; MGD meibomian gland dysfunction; ATs artificial tears; PFAT's preservative free artificial tears; [place] nuclear sclerotic cataract; PSC posterior subcapsular cataract; ERM epi-retinal membrane; PVD posterior vitreous detachment; RD retinal detachment; DM diabetes mellitus; DR diabetic retinopathy; NPDR non-proliferative diabetic retinopathy; PDR proliferative diabetic retinopathy; CSME clinically significant macular edema; DME diabetic macular edema; dbh dot blot hemorrhages; CWS cotton wool spot; POAG primary open angle glaucoma; C/D cup-to-disc ratio; HVF humphrey visual field; GVF goldmann visual field; OCT optical coherence tomography; IOP intraocular pressure; BRVO Branch retinal vein occlusion; CRVO central retinal vein occlusion; CRAO central retinal artery occlusion; BRAO branch retinal artery occlusion; RT retinal tear; SB scleral buckle; PPV pars plana vitrectomy; VH Vitreous hemorrhage; PRP panretinal laser photocoagulation; IVK intravitreal kenalog; VMT vitreomacular traction; MH Macular hole;  NVD neovascularization of the disc; NVE neovascularization elsewhere; AREDS age related eye disease study; ARMD age related macular degeneration; POAG primary open angle glaucoma; EBMD epithelial/anterior basement membrane dystrophy; ACIOL anterior chamber intraocular lens; IOL intraocular lens; PCIOL posterior chamber intraocular lens; Phaco/IOL phacoemulsification with intraocular lens placement; [place] photorefractive keratectomy; LASIK laser assisted in situ keratomileusis; HTN  hypertension; DM diabetes mellitus; COPD chronic obstructive pulmonary disease

## 2019-12-29 NOTE — Assessment & Plan Note (Signed)
OS, condition appears resolved on today's examination will observe henceforth

## 2020-01-06 ENCOUNTER — Encounter: Payer: Self-pay | Admitting: Neurology

## 2020-01-06 ENCOUNTER — Other Ambulatory Visit: Payer: Self-pay

## 2020-01-06 ENCOUNTER — Ambulatory Visit: Payer: HMO | Admitting: Neurology

## 2020-01-06 ENCOUNTER — Ambulatory Visit: Payer: HMO

## 2020-01-06 VITALS — BP 112/60 | HR 82 | Ht 63.0 in | Wt 154.3 lb

## 2020-01-06 DIAGNOSIS — I10 Essential (primary) hypertension: Secondary | ICD-10-CM

## 2020-01-06 DIAGNOSIS — G4719 Other hypersomnia: Secondary | ICD-10-CM | POA: Diagnosis not present

## 2020-01-06 DIAGNOSIS — E119 Type 2 diabetes mellitus without complications: Secondary | ICD-10-CM

## 2020-01-06 DIAGNOSIS — G4726 Circadian rhythm sleep disorder, shift work type: Secondary | ICD-10-CM | POA: Diagnosis not present

## 2020-01-06 DIAGNOSIS — R0683 Snoring: Secondary | ICD-10-CM

## 2020-01-06 DIAGNOSIS — H3581 Retinal edema: Secondary | ICD-10-CM

## 2020-01-06 DIAGNOSIS — E663 Overweight: Secondary | ICD-10-CM | POA: Diagnosis not present

## 2020-01-06 MED ORDER — ATORVASTATIN CALCIUM 20 MG PO TABS: 20.0000 mg | ORAL_TABLET | Freq: Every day | ORAL | 1 refills | Status: DC

## 2020-01-06 MED ORDER — TRESIBA FLEXTOUCH 100 UNIT/ML ~~LOC~~ SOPN: 21.0000 [IU] | PEN_INJECTOR | Freq: Every day | SUBCUTANEOUS | 1 refills | Status: DC

## 2020-01-06 MED ORDER — LISINOPRIL 10 MG PO TABS: 10.0000 mg | ORAL_TABLET | Freq: Every day | ORAL | 0 refills | Status: DC

## 2020-01-06 NOTE — Progress Notes (Signed)
Subjective:    Patient ID: Shannon [name] is a 64 y.o. female.  HPI     Star Age, MD, PhD [facility] [address].O. [place], [place] [address]  Dear Doreene Burke,   I saw your patient, Shannon [name], upon your kind request in my sleep clinic today for initial consultation of her sleep disorder, in particular, concern for underlying obstructive sleep apnea.  The patient is unaccompanied today.  As you know, Shannon [name] is a 64 year old right-handed woman with an underlying medical history of hypertension, hyperlipidemia, diabetes, diabetic retinopathy and overweight state, who reports snoring and excessive daytime somnolence.  She works third shift.  I reviewed your office note from 12/15/2019.  She was advised to seek evaluation for sleep apnea by her retina specialist who was concerned that she may have underlying sleep apnea contributing to her macular edema. Her Epworth sleepiness score is 15/24, fatigue severity score is 19 out of 63.  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  She works day shift for many years.  She is planning to retire in December.  She lives alone, she has 2 grandchildren, 72 year old son and 13 year old daughter.  She has woken up from snoring.  She tries to be in bed around noon or 1 PM and sleeps typically till 6 PM.  It is difficult for her to get more than 4 or 5 hours of sleep.  She does not have to get up to use the bathroom and denies morning headaches.  She limits her caffeine and often only drinks on her day off.  When she does not have to work the next day she sleeps at night, she tends to sleep a little better when she sleeps at night.  She has no pets in the household.  She quit smoking some 15 years ago, drinks alcohol in the form of wine, up to 1/day, admits that it helps her sleep.  She has tried melatonin but does not like to take  it.  Her Past Medical History Is Significant For: Past Medical History:  Diagnosis Date  . Diabetes mellitus without complication ([place])   . Hypercholesteremia   . Hypertension     Her Past Surgical History Is Significant For: Past Surgical History:  Procedure Laterality Date  . ABDOMINAL HYSTERECTOMY    . CATARACT EXTRACTION Bilateral   . CESAREAN SECTION    . MYOMECTOMY      Her Family History Is Significant For: Family History  Problem Relation Age of Onset  . Hypertension Mother   . Diabetes Mother   . Stroke Mother   . Diabetes Father   . Hypertension Father   . Stroke Father   . Hypertension Son   . Diabetes Son   . Breast cancer Neg Hx     Her Social History Is Significant For: Social History   Socioeconomic History  . Marital status: Single    Spouse name: Not on file  . Number of children: Not on file  . Years of education: Not on file  . Highest education level: Not on file  Occupational History  . Not on file  Tobacco Use  . Smoking status: Former Smoker    Quit date: 2006    Years since quitting: 15.5  . Smokeless tobacco: Never Used  . Tobacco comment: quit 10 yrs  Vaping Use  .  Vaping Use: Never used  Substance and Sexual Activity  . Alcohol use: Yes    Comment: 1 glass of wine sometimes daily   . Drug use: Never  . Sexual activity: Not on file  Other Topics Concern  . Not on file  Social History Narrative  . Not on file   Social Determinants of Health   Financial Resource Strain:   . Difficulty of Paying Living Expenses:   Food Insecurity: No Food Insecurity  . Worried About Charity fundraiser in the Last Year: Never true  . Ran Out of Food in the Last Year: Never true  Transportation Needs: No Transportation Needs  . Lack of Transportation (Medical): No  . Lack of Transportation (Non-Medical): No  Physical Activity:   . Days of Exercise per Week:   . Minutes of Exercise per Session:   Stress:   . Feeling of Stress :   Social  Connections:   . Frequency of Communication with Friends and Family:   . Frequency of Social Gatherings with Friends and Family:   . Attends Religious Services:   . Active Member of Clubs or Organizations:   . Attends Archivist Meetings:   Marland Kitchen Marital Status:     Her Allergies Are:  No Known Allergies:   Her Current Medications Are:  Outpatient Encounter Medications as of 01/06/2020  Medication Sig  . acetaminophen (TYLENOL) 500 MG tablet Take 500-1,000 mg by mouth every 6 (six) hours as needed for headache (or pain).  Marland Kitchen atorvastatin (LIPITOR) 20 MG tablet Take 1 tablet (20 mg total) by mouth daily. Hold for next 2 weeks  . Blood Glucose Monitoring Suppl (ONE TOUCH ULTRA MINI) w/Device KIT 1 each by Does not apply route 4 (four) times daily -  before meals and at bedtime.  . Cholecalciferol (VITAMIN D-3 PO) Take 1 capsule by mouth daily.  Marland Kitchen glucose blood (ONETOUCH VERIO) test strip Use as instructed  . insulin degludec (TRESIBA FLEXTOUCH) 100 UNIT/ML SOPN FlexTouch Pen Inject 0.21 mLs (21 Units total) into the skin daily.  . Lancets (ONETOUCH ULTRASOFT) lancets Use as instructed to check blood sugars 4 times daily E11.9  . lisinopril (ZESTRIL) 10 MG tablet Take 1 tablet (10 mg total) by mouth daily.  . metFORMIN (GLUCOPHAGE) 500 MG tablet TAKE ONE TABLET BY MOUTH EVERY DAY WITH MORNING AND EVENING MEALS  . Semaglutide,0.25 or 0.5MG/DOS, (OZEMPIC, 0.25 OR 0.5 MG/DOSE,) 2 MG/1.5ML SOPN Inject 0.5 mg into the skin once a week.  . [DISCONTINUED] Ascorbic Acid (VITAMIN C) 1000 MG tablet Take 1 tablet (1,000 mg total) by mouth daily. Please take for 2 weeks.  . [DISCONTINUED] polyethylene glycol powder (GLYCOLAX/MIRALAX) 17 GM/SCOOP powder polyethylene glycol 3350 17 gram oral powder packet  TAKE 1 PACKET (17G) BY MOUTH DAILY  . [DISCONTINUED] zinc sulfate (ZINC-220) 220 (50 Zn) MG capsule Take 1 capsule (220 mg total) by mouth daily. Please take for 2 weeks   No facility-administered  encounter medications on file as of 01/06/2020.  :  Review of Systems:  Out of a complete 14 point review of systems, all are reviewed and negative with the exception of these symptoms as listed below: Review of Systems  Neurological:       Here for sleep consult. No prior sleep study- Reports she occasionally snores. 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   Epworth Sleepiness Scale 0= would never doze 1= slight chance of dozing 2= moderate chance of dozing 3= high chance of dozing  Sitting and reading: 0 Watching TV:3 Sitting inactive in a public place (ex. Theater or meeting):3 As a passenger in a car for an hour without a break:2 Lying down to rest in the afternoon:3 Sitting and talking to someone:1 Sitting quietly after lunch (no alcohol):3 In a car, while stopped in traffic:0 Total:15     Objective:  Neurological Exam  Physical Exam Physical Examination:   Vitals:   01/06/20 1320  BP: 112/60  Pulse: 82  SpO2: 94%    General Examination: The patient is a very pleasant 64 y.o. female in no acute distress. She appears well-developed and well-nourished and well groomed.   HEENT: Normocephalic, atraumatic, pupils are equal, round and reactive to light, extraocular tracking is good without limitation to gaze excursion or nystagmus noted. Hearing is grossly intact. Face is symmetric with normal facial animation. Speech is clear with no dysarthria noted. There is no hypophonia. There is no lip, neck/head, jaw or voice tremor. Neck is supple with full range of passive and active motion. There are no carotid bruits on auscultation. Oropharynx exam reveals: mild mouth dryness, adequate dental hygiene and moderate airway crowding, due to tonsillar size of 2-3+, slightly wider uvula, Mallampati class II, neck circumference of 14-3/8 inches.  She has a mild overbite.  Tongue protrudes centrally and palate elevates symmetrically.  Chest: Clear to auscultation without  wheezing, rhonchi or crackles noted.  Heart: S1+S2+0, regular and normal without murmurs, rubs or gallops noted.   Abdomen: Soft, non-tender and non-distended with normal bowel sounds appreciated on auscultation.  Extremities: There is no pitting edema in the distal lower extremities bilaterally.   Skin: Warm and dry without trophic changes noted.   Musculoskeletal: exam reveals no obvious joint deformities, tenderness or joint swelling or erythema.   Neurologically:  Mental status: The patient is awake, alert and oriented in all 4 spheres. Her immediate and remote memory, attention, language skills and fund of knowledge are appropriate. There is no evidence of aphasia, agnosia, apraxia or anomia. Speech is clear with normal prosody and enunciation. Thought process is linear. Mood is normal and affect is normal.  Cranial nerves II - XII are as described above under HEENT exam.  Motor exam: Normal bulk, strength and tone is noted. There is no tremor, Romberg is negative. Fine motor skills and coordination: grossly intact.  Cerebellar testing: No dysmetria or intention tremor. There is no truncal or gait ataxia.  Sensory exam: intact to light touch in the upper and lower extremities.  Gait, station and balance: She stands easily. No veering to one side is noted. No leaning to one side is noted. Posture is age-appropriate and stance is narrow based. Gait shows normal stride length and normal pace. No problems turning are noted. Tandem walk is unremarkable.                Assessment and Plan:  In summary, Shannon [name] is a very pleasant 64 y.o.-year old female  with an underlying medical history of hypertension, hyperlipidemia, diabetes, diabetic retinopathy and overweight state, whose history and physical exam are concerning for obstructive sleep apnea (OSA).  Her sleep disturbance and daytime somnolence are also exacerbated likely by her shift work and achieving enough consolidated sleep.   I had a long chat with the patient about my findings and the diagnosis of OSA, its prognosis and treatment options. We talked about medical treatments, surgical interventions and non-pharmacological approaches. I explained in particular the risks and ramifications of untreated moderate to severe  OSA, especially with respect to developing cardiovascular disease down the Road, including congestive heart failure, difficult to treat hypertension, cardiac arrhythmias, or stroke. Even type 2 diabetes has, in part, been linked to untreated OSA. Symptoms of untreated OSA include daytime sleepiness, memory problems, mood irritability and mood disorder such as depression and anxiety, lack of energy, as well as recurrent headaches, especially morning headaches. We talked about trying to maintain a  healthy lifestyle in general, as well as the importance of weight control. We also talked about the importance of good sleep hygiene. I recommended the following at this time: sleep study.   I explained the sleep test procedure to the patient and also outlined possible surgical and non-surgical treatment options of OSA, including the use of a custom-made dental device (which would require a referral to a specialist dentist or oral surgeon), upper airway surgical options, such as traditional UPPP or a novel less invasive surgical option in the form of Inspire hypoglossal nerve stimulation (which would involve a referral to an ENT surgeon). I also explained the CPAP treatment option to the patient, who indicated that she would be willing to try CPAP if the need arises. I explained the importance of being compliant with PAP treatment, not only for insurance purposes but primarily to improve Her symptoms, and for the patient's long term health benefit, including to reduce Her cardiovascular risks. I answered all her questions today and the patien was in agreement. I plan to see her back after the sleep study is completed and  encouraged her to call with any interim questions, concerns, problems or updates.   Thank you very much for allowing me to participate in the care of this nice patient. If I can be of any further assistance to you please do not hesitate to call me at [phone].  Sincerely,   Star Age, MD, PhD

## 2020-01-06 NOTE — Chronic Care Management (AMB) (Signed)
Chronic Care Management Pharmacy  Name: Shannon [name]  MRN: [id number] DOB: 05-27-1956  Chief Complaint/ HPI  Shannon [name],  64 y.o. , female presents for their Initial CCM visit with the clinical pharmacist In office.  PCP : [name], FNP  Their chronic conditions include: HTN, DM, HLD.   Office Visits: 12/15/2019 - Referral for sleep study.   11/16/2019 - AWV and f/u Diabetes visit - recommend Vitamin D, Calcium and Multivitamin. Referral to PharmD due to cost of Ozempic. Ordered Lipid panel, CMP14+EGFR, A1c, CBC, TSH.   08/11/2019 - recommended annual diabetic eye exam. Patient needs new glucometer for insurance. Recheck A1c. Ordered POCT urinalysis, CMP14+EGFR, Lipid panel. Ordered DEXA scan. Prevnar 13 rx sent to pharmacy.   Consult Visits: 12/29/2019 - retreating right eye with Avastin. Left eye appears resolved and will continue to observe.   12/08/2019 - [facility] - improved macular edema with Avastin therapy.  [facility]17/2019 [facility] - Slight improvement on intravitreal Avastin, will repeat. Patient has symptoms positive for sleep apnea. Reports interested in being tested for sleep apnea.   10/27/2019 - [facility] - stable but not improved macular edema at 2 week follow-up after Avastin.    09/29/2019 - [facility] - Vitreal Avastin OD performed. 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   CCM Visits: 07/23/2019 [facility] RN care management call. Patient reports A1c is elevated due to conserving medicine prior to getting Medicare 05/26/2019. Blood sugars range from 147-257.   Medications: Outpatient Encounter Medications as of 01/06/2020  Medication Sig  . atorvastatin (LIPITOR) 20 MG tablet Take 1 tablet (20 mg total) by mouth daily. Hold for next 2 weeks  . Blood Glucose Monitoring Suppl (ONE TOUCH  ULTRA MINI) w/Device KIT 1 each by Does not apply route 4 (four) times daily -  before meals and at bedtime.  . Cholecalciferol (VITAMIN D-3 PO) Take 1 capsule by mouth daily.  Marland Kitchen glucose blood (ONETOUCH VERIO) test strip Use as instructed  . glucose blood test strip Use as instructed  . insulin degludec (TRESIBA FLEXTOUCH) 100 UNIT/ML FlexTouch Pen Inject 0.21 mLs (21 Units total) into the skin daily.  . Lancets (ONETOUCH ULTRASOFT) lancets Use as instructed to check blood sugars 4 times daily E11.9  . lisinopril (ZESTRIL) 10 MG tablet Take 1 tablet (10 mg total) by mouth daily.  . metFORMIN (GLUCOPHAGE) 500 MG tablet TAKE ONE TABLET BY MOUTH EVERY DAY WITH MORNING AND EVENING MEALS (Patient taking differently: Take 500 mg by mouth daily with breakfast. TAKE ONE TABLET BY MOUTH EVERY DAY WITH MORNING AND EVENING MEALS)  . Semaglutide,0.25 or 0.5MG/DOS, (OZEMPIC, 0.25 OR 0.5 MG/DOSE,) 2 MG/1.5ML SOPN Inject 0.5 mg into the skin once a week.  Marland Kitchen acetaminophen (TYLENOL) 500 MG tablet Take 500-1,000 mg by mouth every 6 (six) hours as needed for headache (or pain). (Patient not taking: Reported on 01/24/2020)  . [DISCONTINUED] Ascorbic Acid (VITAMIN C) 1000 MG tablet Take 1 tablet (1,000 mg total) by mouth daily. Please take for 2 weeks.  . [DISCONTINUED] atorvastatin (LIPITOR) 20 MG tablet Take 1 tablet (20 mg total) by mouth daily. Hold for next 2 weeks  . [DISCONTINUED] insulin degludec (TRESIBA FLEXTOUCH) 100 UNIT/ML SOPN FlexTouch Pen Inject 0.21 mLs (21 Units total) into the skin daily.  . [DISCONTINUED] lisinopril (ZESTRIL) 10 MG tablet Take 1 tablet (10 mg total) by  mouth daily.  . [DISCONTINUED] polyethylene glycol powder (GLYCOLAX/MIRALAX) 17 GM/SCOOP powder polyethylene glycol 3350 17 gram oral powder packet  TAKE 1 PACKET (17G) BY MOUTH DAILY  . [DISCONTINUED] zinc sulfate (ZINC-220) 220 (50 Zn) MG capsule Take 1 capsule (220 mg total) by mouth daily. Please take for 2 weeks   No  facility-administered encounter medications on file as of 01/06/2020.   No Known Allergies  Current Diagnosis/Assessment:  SDOH Interventions     Most Recent Value  SDOH Interventions  Financial Strain Interventions Other (Comment)  [Assisted patient with completing patient portion of Ozempic patient assistance application through [employer]            This Visit's Progress   . Pharmacy Care Plan       CARE PLAN ENTRY (see longitudinal plan of care for additional care plan information)  Current Barriers:  . Chronic Disease Management support, education, and care coordination needs related to Hypertension, Hyperlipidemia, Diabetes, and Osteopenia   Hypertension BP Readings from Last 3 Encounters:  01/06/20 112/60  12/15/19 130/72  11/16/19 130/86   . Pharmacist Clinical Goal(s): o Over the next 180 days, patient will work with PharmD and providers to maintain BP goal <130/80 . Current regimen:  o Lisinopril 48m daily . Interventions: o Provided dietary and exercise recommendations . Patient self care activities - Over the next 180 days, patient will: o Check BP if symptomatic, document, and provide at future appointments o Ensure daily salt intake < 2300 mg/day  Hyperlipidemia Lab Results  Component Value Date/Time   LDLCALC 102 (H) 11/16/2019 03:50 PM   . Pharmacist Clinical Goal(s): o Over the next 90 days, patient will work with PharmD and providers to achieve LDL goal < 70 . Current regimen:  o Atorvastatin 270mdaily . Interventions: o Provided dietary and exercise recommendations o Discussed appropriate goals for LDL, HDL and triglycerides . Patient self care activities - Over the next 90 days, patient will: o Exercise for at least 30 minutes daily 5 times per week o Limits fried foods and fatty foods o Use PLATE method for planning a well-balanced diet  Diabetes Lab Results  Component Value Date/Time   HGBA1C 7.4 (H) 11/16/2019  03:50 PM   HGBA1C 9.4 (H) 08/11/2019 09:16 AM   . Pharmacist Clinical Goal(s): o Over the next 90 days, patient will work with PharmD and providers to achieve A1c goal <7% . Current regimen:   Metformin 500 mg twice daily with meals  Ozempic 0.5 mg into the skin once a week (Fridays)  Tresiba 100 units/mL 21 units into the skin daily . Interventions: o Provided dietary and exercise recommendations o Recommend patient drink 64 ounces of water daily o Assisted patient with completing patient portion of Ozempic patient assistance application. Will provide to PCP to sign and will fax completed application to [employer] formulary and suggested alternative metformin (Metformin XR 75067maily) to PCP  o Collaborate with PCP staff to send in prescription for blood glucose meter to [facility] . Patient self care activities - Over the next 90 days, patient will: o Check blood sugar once daily, document, and provide at future appointments o Contact provider with any episodes of hypoglycemia o Exercise for at least 30 minutes daily 5 times per week  Osteopenia . Pharmacist Clinical Goal(s) o Over the next 180 days, patient will work with PharmD and providers to take appropriate supplementation to strengthen bones . Current regimen:  o  N/A . Interventions: o Provided patient education about osteopenia and recommended supplements  o Encouraged weight bearing exercises 3 days per week . Patient self care activities - Over the next 90 days, patient will: o Start taking calcium 1242m daily o Start taking Cholecalciferol 3000 units daily o Start weight-bearing exercises 3 days per week  Medication management . Pharmacist Clinical Goal(s): o Over the next 180 days, patient will work with PharmD and providers to achieve optimal medication adherence . Current pharmacy: [facility] . Interventions o Comprehensive medication review performed. o Utilize [facility] for  medication synchronization, packaging and delivery . Patient self care activities - Over the next 180 days, patient will: o Focus on medication adherence by utilization adherence packaging and medication synchronization o Take medications as prescribed o Report any questions or concerns to PharmD and/or provider(s)  Initial goal documentation       Hyperlipidemia   LDL goal < 70  Lipid Panel     Component Value Date/Time   CHOL 189 11/16/2019 1550   TRIG 211 (H) 11/16/2019 1550   HDL 51 11/16/2019 1550   LDLCALC 102 (H) 11/16/2019 1550    Hepatic Function Latest Ref Rng & Units 11/16/2019 08/11/2019 06/01/2019  Total Protein 6.0 - 8.5 g/dL 7.1 7.2 7.5  Albumin 3.8 - 4.8 g/dL 4.7 4.6 4.9(H)  AST 0 - 40 IU/L 13 12 16   ALT 0 - 32 IU/L 17 18 26   Alk Phosphatase 48 - 121 IU/L 91 88 108  Total Bilirubin 0.0 - 1.2 mg/dL 0.5 0.5 0.3    The 10-year ASCVD risk score (Mikey BussingDC Jr., et al., 2013) is: 14.8%   Values used to calculate the score:     Age: [age]years     Sex: Female     Is Non-Hispanic African American: Yes     Diabetic: Yes     Tobacco smoker: No     Systolic Blood Pressure: 1469mmHg     Is BP treated: Yes     HDL Cholesterol: 51 mg/dL     Total Cholesterol: 189 mg/dL   Patient has failed these meds in past: N/A Patient is currently uncontrolled on the following medications:  . Atorvastatin 20 mg daily  We discussed:  Diet extensively Pt states that she tries to minimize sweet and starches She eats chicken and fish, not much red meat Snacks on fruits, nuts and raisins Eats some pasta (not a lot) and sometimes has fried foods Pt works night shift and that causes a problem with her diet Recommend pt limit fried foods and foods high in saturated fats Recommend PLATE method for planning a well-balanced diet Drinks 16oz of water daily Recommend pt drink 64 ounces of water daily Pt drinks a lot of sweet tea, but uses a sugar substitute, drinks juice every now and  then Has one glass of wine daily or every other day Exercise extensively Pt does water aerobics Mon/Tues/Thurs/Fri for 45 minutes Pt also is a [employer] and participates in workout classes Mon/Fri for 45 minutes  Plan Continue current medications  Diabetes   Recent Relevant Labs: Lab Results  Component Value Date/Time   HGBA1C 7.4 (H) 11/16/2019 03:50 PM   HGBA1C 9.4 (H) 08/11/2019 09:16 AM   MICROALBUR 80 08/11/2019 10:29 AM    Checking BG: Daily  Recent FBG Readings: 162 this morning, usually 129-142 Recent pre-meal BG readings:  Recent 2hr PP BG readings:   Recent HS BG readings:  Patient has  failed these meds in past: Victoza, Actos Patient is currently uncontrolled on the following medications:   Metformin 500 mg twice daily with meals  Ozempic 0.5 mg into the skin once a week (Fridays)  Tyler Aas 100 units/mL 21 units into the skin daily  Last diabetic Foot exam: 06/01/19 Last diabetic Eye exam:  Lab Results  Component Value Date/Time   HMDIABEYEEXA Retinopathy (A) 09/14/2019 12:00 AM    We discussed:   BG was elevated this morning, but pt said that was due to something she ate yesterday  BG has been coming down  Goal A1c <7%  Goal FBG 80-130  Pt denies side effects from Ozempic injection  Ozempic is too expensive for pt  Reviewed insurance formulary and determined that during the Medicare coverage gap, this medication is treated like any other name brand medication (which is why the copay is so high)  Pt provided proof of income documents at today's appointment and reviewed signed patient assistance application for Ozempic through [employer].  Metformin causes pt some diarrhea, but that helps with her constipation  Due to this side effect, pt is only taking Metformin 533m once daily in the morning  Discussed possible option of Metformin XR as an alternative to metformin IR  Reviewed pt's formulary and it appears that metformin XR  7563mis covered, but metformin XR 500108mnd XR 1000m36me not covered  Pt states that [facility] received prescription for BG testing strips and lancets, but did not receive a prescription for the meter  Plan Continue current medications  Provide Ozempic PAP application to PCP to sign and the fax to [employer] PCP to discuss possibility of changing pt's metformin to metformin XR 750mg27me daily  Coordinate with PCP staff to send in prescription for OneTouch Ultra mini meter to check  Hypertension   Office blood pressures are  BP Readings from Last 3 Encounters:  01/06/20 112/60  12/15/19 130/72  11/16/19 130/86   Patient has failed these meds in the past: N/A Patient is currently controlled on the following medications:   Lisinopril 10 mg daily  Patient checks BP at home infrequently  Patient home BP readings are ranging: None to provide  We discussed:  Pt states that she is on her lisinopril for kidneys, but has never really had an issue with her blood pressure  Denies andy headaches or dizziness  Plan Continue current medications   Osteopenia / Osteoporosis   Last DEXA Scan: 12/09/2019   T-Score femoral neck: -1.7  T-Score forearm radius: -0.3  10-year probability of major osteoporotic fracture: 4.3%  10-year probability of hip fracture: 0.6%  No results found for: VD25OH   Patient is not a candidate for pharmacologic treatment  Patient has failed these meds in past: N/A Patient is currently uncontrolled on the following medications: N/A  We discussed:  Recommend 1200 mg of calcium daily from dietary and supplemental sources. Recommend weight-bearing and muscle strengthening exercises for building and maintaining bone density.  Pt has cholecalciferol 3000 units and states she will start taking daily  Plan Continue current medications  Recommend Vitamin D at next PCP office visit  Health Maintenance   Patient is currently on the following  medications:   Tylenol 500 mg-1000 mg q6h prn pain . Vitamin C 1000 mg daily  . Fish Oil daily  We discussed:    Pt takes Tylenol very rarely as needed for pain  Pt has been puncturing fish oil capsules because they are  too large to swallow.   Recommend pt try smaller capsules, such as krill oil capsules  Plan Continue current medications  Vaccines   Reviewed and discussed patient's vaccination history.    Immunization History  Administered Date(s) Administered  . Influenza,inj,Quad PF,6+ Mos 04/18/2018  . Influenza-Unspecified 05/01/2019  . PFIZER SARS-COV-2 Vaccination 09/18/2019, 10/14/2019  . Pneumococcal Polysaccharide-23 05/30/2018  . Tdap 11/28/2017    Plan Discuss at follow up  Medication Management   Pt uses [facility] for all medications Uses pill box? Yes Pt endorses 84% compliance  We discussed:   Pt states she missed 4/5 doses of medication a month Importance of taking all medications as directed every day  Benefits of medication synchronization and adherence packaging  Plan Utilize [facility] for medication synchronization, packaging and delivery  Verbal consent obtained for [facility] enhanced pharmacy services (medication synchronization, adherence packaging, delivery coordination). A medication sync plan was created to allow patient to get all medications delivered once every 30 to 90 days per patient preference. Patient understands they have freedom to choose pharmacy and clinical pharmacist will coordinate care between all prescribers and [facility].   Follow up: 2 month phone visit  [name], PharmD Clinical Pharmacist [facility] [phone]

## 2020-01-06 NOTE — Patient Instructions (Addendum)
Thank you for choosing [facility] for your sleep related care! It was nice to meet you today! I appreciate that you entrust me with your sleep related healthcare concerns. I hope, I was able to address at least some of your concerns today, and that I can help you feel reassured and also get better.    Here is what we discussed today and what we came up with as our plan for you:    Based on your symptoms and your exam I believe you are at risk for obstructive sleep apnea (aka OSA), and I think we should proceed with a sleep study to determine whether you do or do not have OSA and how severe it is. Even, if you have mild OSA, I may want you to consider treatment with CPAP, as treatment of even borderline or mild sleep apnea can result and improvement of symptoms such as sleep disruption, daytime sleepiness, nighttime bathroom breaks, restless leg symptoms, improvement of headache syndromes, even improved mood disorder.   Please remember, the long-term risks and ramifications of untreated moderate to severe obstructive sleep apnea are: increased Cardiovascular disease, including congestive heart failure, stroke, difficult to control hypertension, treatment resistant obesity, arrhythmias, especially irregular heartbeat commonly known as A. Fib. (atrial fibrillation); even type 2 diabetes has been linked to untreated OSA.   Sleep apnea can cause disruption of sleep and sleep deprivation in most cases, which, in turn, can cause recurrent headaches, problems with memory, mood, concentration, focus, and vigilance. Most people with untreated sleep apnea report excessive daytime sleepiness, which can affect their ability to drive. Please do not drive if you feel sleepy. Patients with sleep apnea developed difficulty initiating and maintaining sleep (aka insomnia).   Having sleep apnea may increase your risk for other sleep disorders, including involuntary behaviors sleep such as sleep terrors,  sleep talking, sleepwalking.    Having sleep apnea can also increase your risk for restless leg syndrome and leg movements at night.   Please note that untreated obstructive sleep apnea may carry additional perioperative morbidity. Patients with significant obstructive sleep apnea (typically, in the moderate to severe degree) should receive, if possible, perioperative PAP (positive airway pressure) therapy and the surgeons and particularly the anesthesiologists should be informed of the diagnosis and the severity of the sleep disordered breathing.   I will likely see you back after your sleep study to go over the test results and where to go from there. We will call you after your sleep study to advise about the results (most likely, you will hear from Megan, my nurse) and to set up an appointment at the time, as necessary.    Our sleep lab administrative assistant will call you to schedule your sleep study and give you further instructions, regarding the check in process for the sleep study, arrival time, what to bring, when you can expect to leave after the study, etc., and to answer any other logistical questions you may have. If you don't hear back from her by about 2 weeks from now, please feel free to call her direct line at [phone] or you can call our general clinic number, or email us through My Chart.   

## 2020-01-11 ENCOUNTER — Encounter: Payer: Self-pay | Admitting: Nurse Practitioner

## 2020-01-11 ENCOUNTER — Other Ambulatory Visit: Payer: Self-pay

## 2020-01-11 MED ORDER — GLUCOSE BLOOD VI STRP: ORAL_STRIP | 5 refills | Status: DC

## 2020-01-19 ENCOUNTER — Encounter (INDEPENDENT_AMBULATORY_CARE_PROVIDER_SITE_OTHER): Payer: HMO | Admitting: Ophthalmology

## 2020-01-21 ENCOUNTER — Ambulatory Visit: Payer: Self-pay

## 2020-01-21 DIAGNOSIS — I10 Essential (primary) hypertension: Secondary | ICD-10-CM

## 2020-01-21 DIAGNOSIS — E119 Type 2 diabetes mellitus without complications: Secondary | ICD-10-CM

## 2020-01-21 DIAGNOSIS — Z794 Long term (current) use of insulin: Secondary | ICD-10-CM

## 2020-01-21 NOTE — Chronic Care Management (AMB) (Signed)
   Chronic Care Management Pharmacy  Name: Shannon [name]  MRN: [id number] DOB: 1955-08-25  PCP : Arnette Felts, FNP  [employer] patient assistance application for Ozempic completed and faxed today, 01/21/20. Will follow up within 5 business days to check on status of application. Once approved, medication will be delivered to the office in 10-14 days. HIPAA compliant voicemail left to notify patient of the status of PAP application.  SDOH Interventions     Most Recent Value  SDOH Interventions  Financial Strain Interventions Other (Comment)  [Assisted with completion of Ozempic patient assistance application and faxed to [employer]]     Goals Addressed            This Visit's Progress   . Ozempic Patient Assistance       CARE PLAN ENTRY (see longitudinal plan of care for additional care plan information)  Current Barriers:  . Financial Barriers: patient has [employer] insurance and reports copay for Ozempic is cost prohibitive at this time .  Pharmacist Clinical Goal(s):  Marland Kitchen Over the next 30 days, patient will work with PharmD and providers to relieve medication access concerns  Interventions: . Comprehensive medication review completed; medication list updated in electronic medical record.  Rene Paci care team collaboration (see longitudinal plan of care) . Ozempic by [employer]: Patient meets income criteria for this medication's patient assistance program. Reviewed application process. Patient provided proof of income, and signed application. Primary care provider Arnette Felts, FNP completed their portion of application. . Complete application faxed to [employer] on 01/21/20  Patient Self Care Activities:  . Patient will use samples until medication can be obtained through [employer]  Initial goal documentation       Beryle Flock, PharmD Clinical Pharmacist [facility] [phone]

## 2020-01-21 NOTE — Patient Instructions (Signed)
Visit Information  Goals Addressed            This Visit's Progress   . Ozempic Patient Assistance       CARE PLAN ENTRY (see longitudinal plan of care for additional care plan information)  Current Barriers:  . Financial Barriers: patient has [employer] insurance and reports copay for Ozempic is cost prohibitive at this time .  Pharmacist Clinical Goal(s):  Marland Kitchen Over the next 30 days, patient will work with PharmD and providers to relieve medication access concerns  Interventions: . Comprehensive medication review completed; medication list updated in electronic medical record.  Rene Paci care team collaboration (see longitudinal plan of care) . Ozempic by [employer]: Patient meets income criteria for this medication's patient assistance program. Reviewed application process. Patient provided proof of income, and signed application. Primary care provider Arnette Felts, FNP completed their portion of application. . Complete application faxed to [employer] on 01/21/20  Patient Self Care Activities:  . Patient will use samples until medication can be obtained through [employer]  Initial goal documentation        Print copy of patient instructions provided.   Telephone follow up appointment with pharmacy team member scheduled for: 03/09/20 @ 4 PM  Beryle Flock, PharmD Clinical Pharmacist [facility] [phone]

## 2020-01-24 NOTE — Patient Instructions (Addendum)
Visit Information  Goals Addressed            This Visit's Progress   . Pharmacy Care Plan       CARE PLAN ENTRY (see longitudinal plan of care for additional care plan information)  Current Barriers:  . Chronic Disease Management support, education, and care coordination needs related to Hypertension, Hyperlipidemia, Diabetes, and Osteopenia   Hypertension BP Readings from Last 3 Encounters:  01/06/20 112/60  12/15/19 130/72  11/16/19 130/86   . Pharmacist Clinical Goal(s): o Over the next 180 days, patient will work with PharmD and providers to maintain BP goal <130/80 . Current regimen:  o Lisinopril 10mg  daily . Interventions: o Provided dietary and exercise recommendations . Patient self care activities - Over the next 180 days, patient will: o Check BP if symptomatic, document, and provide at future appointments o Ensure daily salt intake < 2300 mg/day  Hyperlipidemia Lab Results  Component Value Date/Time   LDLCALC 102 (H) 11/16/2019 03:50 PM   . Pharmacist Clinical Goal(s): o Over the next 90 days, patient will work with PharmD and providers to achieve LDL goal < 70 . Current regimen:  o Atorvastatin 20mg  daily . Interventions: o Provided dietary and exercise recommendations o Discussed appropriate goals for LDL, HDL and triglycerides . Patient self care activities - Over the next 90 days, patient will: o Exercise for at least 30 minutes daily 5 times per week o Limits fried foods and fatty foods o Use PLATE method for planning a well-balanced diet  Diabetes Lab Results  Component Value Date/Time   HGBA1C 7.4 (H) 11/16/2019 03:50 PM   HGBA1C 9.4 (H) 08/11/2019 09:16 AM   . Pharmacist Clinical Goal(s): o Over the next 90 days, patient will work with PharmD and providers to achieve A1c goal <7% . Current regimen:   Metformin 500 mg twice daily with meals  Ozempic 0.5 mg into the skin once a week (Fridays)  Tresiba 100 units/mL 21 units into the skin  daily . Interventions: o Provided dietary and exercise recommendations o Recommend patient drink 64 ounces of water daily o Assisted patient with completing patient portion of Ozempic patient assistance application. Will provide to PCP to sign and will fax completed application to 08/13/2019 o Reviewed patient's formulary and suggested alternative metformin (Metformin XR 750mg  daily) to PCP  o Collaborate with PCP staff to send in prescription for blood glucose meter to [facility] . Patient self care activities - Over the next 90 days, patient will: o Check blood sugar once daily, document, and provide at future appointments o Contact provider with any episodes of hypoglycemia o Exercise for at least 30 minutes daily 5 times per week  Osteopenia . Pharmacist Clinical Goal(s) o Over the next 180 days, patient will work with PharmD and providers to take appropriate supplementation to strengthen bones . Current regimen:  o  N/A . Interventions: o Provided patient education about osteopenia and recommended supplements  o Encouraged weight bearing exercises 3 days per week . Patient self care activities - Over the next 90 days, patient will: o Start taking calcium 1200mg  daily o Start taking Cholecalciferol 3000 units daily o Start weight-bearing exercises 3 days per week  Medication management . Pharmacist Clinical Goal(s): o Over the next 180 days, patient will work with PharmD and providers to achieve optimal medication adherence . Current pharmacy: [facility] . Interventions o Comprehensive medication review performed. o Utilize [facility] pharmacy for medication synchronization, packaging and delivery . Patient self care  activities - Over the next 180 days, patient will: o Focus on medication adherence by utilization adherence packaging and medication synchronization o Take medications as prescribed o Report any questions or concerns to PharmD and/or provider(s)  Initial goal  documentation        Shannon [name] was given information about Chronic Care Management services today including:  1. CCM service includes personalized support from designated clinical staff supervised by her physician, including individualized plan of care and coordination with other care providers 2. 24/7 contact phone numbers for assistance for urgent and routine care needs. 3. Standard insurance, coinsurance, copays and deductibles apply for chronic care management only during months in which we provide at least 20 minutes of these services. Most insurances cover these services at 100%, however patients may be responsible for any copay, coinsurance and/or deductible if applicable. This service may help you avoid the need for more expensive face-to-face services. 4. Only one practitioner may furnish and bill the service in a calendar month. 5. The patient may stop CCM services at any time (effective at the end of the month) by phone call to the office staff.  Patient agreed to services and verbal consent obtained.   The patient verbalized understanding of instructions provided today and agreed to receive a mailed copy of patient instruction and/or educational materials. Telephone follow up appointment with pharmacy team member scheduled for: 03/09/20 @ 4:00 PM  Beryle Flock, PharmD Clinical Pharmacist [facility] [phone]   Diabetes Mellitus and Nutrition, Adult When you have diabetes (diabetes mellitus), it is very important to have healthy eating habits because your blood sugar (glucose) levels are greatly affected by what you eat and drink. Eating healthy foods in the appropriate amounts, at about the same times every day, can help you:  Control your blood glucose.  Lower your risk of heart disease.  Improve your blood pressure.  Reach or maintain a healthy weight. Every person with diabetes is different, and each person has different needs for a meal  plan. Your health care provider may recommend that you work with a diet and nutrition specialist (dietitian) to make a meal plan that is best for you. Your meal plan may vary depending on factors such as:  The calories you need.  The medicines you take.  Your weight.  Your blood glucose, blood pressure, and cholesterol levels.  Your activity level.  Other health conditions you have, such as heart or kidney disease. How do carbohydrates affect me? Carbohydrates, also called carbs, affect your blood glucose level more than any other type of food. Eating carbs naturally raises the amount of glucose in your blood. Carb counting is a method for keeping track of how many carbs you eat. Counting carbs is important to keep your blood glucose at a healthy level, especially if you use insulin or take certain oral diabetes medicines. It is important to know how many carbs you can safely have in each meal. This is different for every person. Your dietitian can help you calculate how many carbs you should have at each meal and for each snack. Foods that contain carbs include:  Bread, cereal, rice, pasta, and crackers.  Potatoes and corn.  Peas, beans, and lentils.  Milk and yogurt.  Fruit and juice.  Desserts, such as cakes, cookies, ice cream, and candy. How does alcohol affect me? Alcohol can cause a sudden decrease in blood glucose (hypoglycemia), especially if you use insulin or take certain oral diabetes medicines. Hypoglycemia can be  a life-threatening condition. Symptoms of hypoglycemia (sleepiness, dizziness, and confusion) are similar to symptoms of having too much alcohol. If your health care provider says that alcohol is safe for you, follow these guidelines:  Limit alcohol intake to no more than 1 drink per day for nonpregnant women and 2 drinks per day for men. One drink equals 12 oz of beer, 5 oz of wine, or 1 oz of hard liquor.  Do not drink on an empty stomach.  Keep yourself  hydrated with water, diet soda, or unsweetened iced tea.  Keep in mind that regular soda, juice, and other mixers may contain a lot of sugar and must be counted as carbs. What are tips for following this plan?  Reading food labels  Start by checking the serving size on the "Nutrition Facts" label of packaged foods and drinks. The amount of calories, carbs, fats, and other nutrients listed on the label is based on one serving of the item. Many items contain more than one serving per package.  Check the total grams (g) of carbs in one serving. You can calculate the number of servings of carbs in one serving by dividing the total carbs by 15. For example, if a food has 30 g of total carbs, it would be equal to 2 servings of carbs.  Check the number of grams (g) of saturated and trans fats in one serving. Choose foods that have low or no amount of these fats.  Check the number of milligrams (mg) of salt (sodium) in one serving. Most people should limit total sodium intake to less than 2,300 mg per day.  Always check the nutrition information of foods labeled as "low-fat" or "nonfat". These foods may be higher in added sugar or refined carbs and should be avoided.  Talk to your dietitian to identify your daily goals for nutrients listed on the label. Shopping  Avoid buying canned, premade, or processed foods. These foods tend to be high in fat, sodium, and added sugar.  Shop around the outside edge of the grocery store. This includes fresh fruits and vegetables, bulk grains, fresh meats, and fresh dairy. Cooking  Use low-heat cooking methods, such as baking, instead of high-heat cooking methods like deep frying.  Cook using healthy oils, such as olive, canola, or sunflower oil.  Avoid cooking with butter, cream, or high-fat meats. Meal planning  Eat meals and snacks regularly, preferably at the same times every day. Avoid going long periods of time without eating.  Eat foods high in  fiber, such as fresh fruits, vegetables, beans, and whole grains. Talk to your dietitian about how many servings of carbs you can eat at each meal.  Eat 4-6 ounces (oz) of lean protein each day, such as lean meat, chicken, fish, eggs, or tofu. One oz of lean protein is equal to: ? 1 oz of meat, chicken, or fish. ? 1 egg. ?  cup of tofu.  Eat some foods each day that contain healthy fats, such as avocado, nuts, seeds, and fish. Lifestyle  Check your blood glucose regularly.  Exercise regularly as told by your health care provider. This may include: ? 150 minutes of moderate-intensity or vigorous-intensity exercise each week. This could be brisk walking, biking, or water aerobics. ? Stretching and doing strength exercises, such as yoga or weightlifting, at least 2 times a week.  Take medicines as told by your health care provider.  Do not use any products that contain nicotine or tobacco, such as cigarettes and  e-cigarettes. If you need help quitting, ask your health care provider.  Work with a Veterinary surgeon or diabetes educator to identify strategies to manage stress and any emotional and social challenges. Questions to ask a health care provider  Do I need to meet with a diabetes educator?  Do I need to meet with a dietitian?  What number can I call if I have questions?  When are the best times to check my blood glucose? Where to find more information:  American Diabetes Association: diabetes.org  Academy of Nutrition and Dietetics: www.eatright.[employer] of Diabetes and Digestive and Kidney Diseases (NIH): CarFlippers.tn Summary  A healthy meal plan will help you control your blood glucose and maintain a healthy lifestyle.  Working with a diet and nutrition specialist (dietitian) can help you make a meal plan that is best for you.  Keep in mind that carbohydrates (carbs) and alcohol have immediate effects on your blood glucose levels. It is important to count  carbs and to use alcohol carefully. This information is not intended to replace advice given to you by your health care provider. Make sure you discuss any questions you have with your health care provider. Document Revised: 05/24/2017 Document Reviewed: 07/16/2016 Elsevier Patient Education  2020 [employer].

## 2020-01-28 ENCOUNTER — Other Ambulatory Visit: Payer: Self-pay

## 2020-01-28 MED ORDER — METFORMIN HCL ER 750 MG PO TB24: 750.0000 mg | ORAL_TABLET | Freq: Every day | ORAL | 0 refills | Status: DC

## 2020-02-01 ENCOUNTER — Ambulatory Visit: Payer: Self-pay

## 2020-02-01 DIAGNOSIS — I10 Essential (primary) hypertension: Secondary | ICD-10-CM

## 2020-02-01 DIAGNOSIS — E119 Type 2 diabetes mellitus without complications: Secondary | ICD-10-CM

## 2020-02-01 NOTE — Chronic Care Management (AMB) (Signed)
Chronic Care Management Pharmacy  Name: Shannon [name]  MRN: [id number] DOB: April 19, 1956  Chief Complaint/ HPI  Outgoing call with Carlton Adam,  64 y.o. , female to review medications needed and coordinate delivery date. Patient assistance for Ozempic discussed. Patient also expressed still having difficulty obtaining blood glucose meter from [facility] pharmacy. Will reach out to [facility] to try and obtain OneTouch Verio meter and lancets.   PCP : Arnette Felts, FNP  Reviewed chart for medication changes ahead of medication coordination call.  No OVs, Consults, or hospital visits since last care coordination call/Pharmacist visit.   New medications: Metformin 500mg  twice daily changed to Metformin XR 750mg  daily with breakfast at recommendation of PharmD. Discussed change with patient during today's phone call. Pt voiced understanding, but would like to complete current fill of metformin before changing to the new strength.   BP Readings from Last 3 Encounters:  01/06/20 112/60  12/15/19 130/72  11/16/19 130/86    Lab Results  Component Value Date   HGBA1C 7.4 (H) 11/16/2019    Patient obtains medications through Adherence Packaging  90 Days   Patient is due for first adherence delivery on: 03/25/20 Called patient and reviewed medications and coordinated delivery.  This delivery to include: Atorvastatin 20mg  daily Metformin XR 750mg  daily at breakfast Lisinopril 10mg  daily Tresiba Flextouch 100 units/ml 21 units daily  New prescriptions have already been sent to [facility] pharmacy by PCP.  Confirmed delivery date of 03/25/20, advised patient that pharmacy will contact them the morning of delivery.  Discussed status of patient assistance application for Ozempic through . is placing phone call to to determine status and application and delivery. Will notify patient. Sample available for pt to pickup at the office until  Ozempic is delivered from 07-29-1976.  SDOH Interventions     Most Recent Value  SDOH Interventions  Financial Strain Interventions Other (Comment)  [Assisted patient with completing patient assistance application for Ozempic through 05/25/20. Providing samples while awaiting determination]     Goals Addressed            This Visit's Progress   . Pharmacy Care Plan       CARE PLAN ENTRY (see longitudinal plan of care for additional care plan information)  Current Barriers:  . Chronic Disease Management support, education, and care coordination needs related to Hypertension, Hyperlipidemia, Diabetes, and Osteopenia   Hypertension BP Readings from Last 3 Encounters:  01/06/20 112/60  12/15/19 130/72  11/16/19 130/86   . Pharmacist Clinical Goal(s): o Over the next 180 days, patient will work with PharmD and providers to maintain BP goal <130/80 . Current regimen:  o Lisinopril 10mg  daily . Interventions: o Provided dietary and exercise recommendations . Patient self care activities - Over the next 180 days, patient will: o Check BP if symptomatic, document, and provide at future appointments o Ensure daily salt intake < 2300 mg/day  Hyperlipidemia Lab Results  Component Value Date/Time   LDLCALC 102 (H) 11/16/2019 03:50 PM   . Pharmacist Clinical Goal(s): o Over the next 90 days, patient will work with PharmD and providers to achieve LDL goal < 70 . Current regimen:  o Atorvastatin 20mg  daily . Interventions: o Provided dietary and exercise recommendations o Discussed appropriate goals for LDL, HDL and triglycerides . Patient self care activities - Over the next 90 days, patient will: o Exercise for at least 30 minutes daily 5 times per week o Limits fried  foods and fatty foods o Use PLATE method for planning a well-balanced diet  Diabetes Lab Results  Component Value Date/Time   HGBA1C 7.4 (H) 11/16/2019 03:50 PM   HGBA1C 9.4 (H) 08/11/2019 09:16 AM    . Pharmacist Clinical Goal(s): o Over the next 90 days, patient will work with PharmD and providers to achieve A1c goal <7% . Current regimen:   Metformin 500 mg twice daily with meals  Ozempic 0.5 mg into the skin once a week (Fridays)  Tresiba 100 units/mL 21 units into the skin daily . Interventions: o Provided dietary and exercise recommendations o Recommend patient drink 64 ounces of water daily o Assisted patient with completing patient portion of Ozempic patient assistance application. Will provide to PCP to sign and will fax completed application to [employer] with PCP after formulary review to change patient to Metformin XR 750mg  once daily. PCP agreed and new prescription sent.  o Collaborate with PCP staff to send in prescription for blood glucose meter to [facility] o Contact [facility] for assistance with obtaining OneTouch blood glucose meter and lancets.  o Provided Ozempic sample at office while patient is waiting to hear back from patient assistance program. . Patient self care activities - Over the next 90 days, patient will: o Check blood sugar once daily, document, and provide at future appointments o Contact provider with any episodes of hypoglycemia o Exercise for at least 30 minutes daily 5 times per week o Start metformin XR 750mg  once daily after completing current fill of metformin 500mg  o Pick up Ozempic 0.5mg  sample from office  Osteopenia . Pharmacist Clinical Goal(s) o Over the next 180 days, patient will work with PharmD and providers to take appropriate supplementation to strengthen bones . Current regimen:  o  N/A . Interventions: o Provided patient education about osteopenia and recommended supplements  o Encouraged weight bearing exercises 3 days per week . Patient self care activities - Over the next 90 days, patient will: o Start taking calcium 1200mg  daily o Start taking Cholecalciferol 3000 units daily o Start  weight-bearing exercises 3 days per week  Medication management . Pharmacist Clinical Goal(s): o Over the next 180 days, patient will work with PharmD and providers to achieve optimal medication adherence . Current pharmacy: [facility] . Interventions o Comprehensive medication review performed. o Verified last fill dates and when patient will need mediations filled again o Utilize [facility] pharmacy for medication synchronization, packaging and delivery . Patient self care activities - Over the next 180 days, patient will: o Focus on medication adherence by utilization adherence packaging and medication synchronization o Take medications as prescribed o Report any questions or concerns to PharmD and/or provider(s)  Please see past updates related to this goal by clicking on the "Past Updates" button in the selected goal        Follow up: 1 month phone visit  [employer], PharmD Clinical Pharmacist [facility] [phone]

## 2020-02-01 NOTE — Patient Instructions (Addendum)
Visit Information  Goals Addressed            This Visit's Progress   . Pharmacy Care Plan       CARE PLAN ENTRY (see longitudinal plan of care for additional care plan information)  Current Barriers:  . Chronic Disease Management support, education, and care coordination needs related to Hypertension, Hyperlipidemia, Diabetes, and Osteopenia   Hypertension BP Readings from Last 3 Encounters:  01/06/20 112/60  12/15/19 130/72  11/16/19 130/86   . Pharmacist Clinical Goal(s): o Over the next 180 days, patient will work with PharmD and providers to maintain BP goal <130/80 . Current regimen:  o Lisinopril 10mg  daily . Interventions: o Provided dietary and exercise recommendations . Patient self care activities - Over the next 180 days, patient will: o Check BP if symptomatic, document, and provide at future appointments o Ensure daily salt intake < 2300 mg/day  Hyperlipidemia Lab Results  Component Value Date/Time   LDLCALC 102 (H) 11/16/2019 03:50 PM   . Pharmacist Clinical Goal(s): o Over the next 90 days, patient will work with PharmD and providers to achieve LDL goal < 70 . Current regimen:  o Atorvastatin 20mg  daily . Interventions: o Provided dietary and exercise recommendations o Discussed appropriate goals for LDL, HDL and triglycerides . Patient self care activities - Over the next 90 days, patient will: o Exercise for at least 30 minutes daily 5 times per week o Limits fried foods and fatty foods o Use PLATE method for planning a well-balanced diet  Diabetes Lab Results  Component Value Date/Time   HGBA1C 7.4 (H) 11/16/2019 03:50 PM   HGBA1C 9.4 (H) 08/11/2019 09:16 AM   . Pharmacist Clinical Goal(s): o Over the next 90 days, patient will work with PharmD and providers to achieve A1c goal <7% . Current regimen:   Metformin 500 mg twice daily with meals  Ozempic 0.5 mg into the skin once a week (Fridays)  Tresiba 100 units/mL 21 units into the skin  daily . Interventions: o Provided dietary and exercise recommendations o Recommend patient drink 64 ounces of water daily o Assisted patient with completing patient portion of Ozempic patient assistance application. Will provide to PCP to sign and will fax completed application to 08/13/2019 with PCP after formulary review to change patient to Metformin XR 750mg  once daily. PCP agreed and new prescription sent.  o Collaborate with PCP staff to send in prescription for blood glucose meter to [facility] o Contact [facility] [employer] for assistance with obtaining OneTouch blood glucose meter and lancets.  o Provided Ozempic sample at office while patient is waiting to hear back from 09-07-2005 patient assistance program. . Patient self care activities - Over the next 90 days, patient will: o Check blood sugar once daily, document, and provide at future appointments o Contact provider with any episodes of hypoglycemia o Exercise for at least 30 minutes daily 5 times per week o Start metformin XR 750mg  once daily after completing current fill of metformin 500mg  o Pick up Ozempic 0.5mg  sample from office  Osteopenia . Pharmacist Clinical Goal(s) o Over the next 180 days, patient will work with PharmD and providers to take appropriate supplementation to strengthen bones . Current regimen:  o  N/A . Interventions: o Provided patient education about osteopenia and recommended supplements  o Encouraged weight bearing exercises 3 days per week . Patient self care activities - Over the next 90 days, patient will: o Start taking calcium 1200mg  daily o  Start taking Cholecalciferol 3000 units daily o Start weight-bearing exercises 3 days per week  Medication management . Pharmacist Clinical Goal(s): o Over the next 180 days, patient will work with PharmD and providers to achieve optimal medication adherence . Current pharmacy: [facility] . Interventions o Comprehensive medication review  performed. o Verified last fill dates and when patient will need mediations filled again o Utilize [facility] pharmacy for medication synchronization, packaging and delivery . Patient self care activities - Over the next 180 days, patient will: o Focus on medication adherence by utilization adherence packaging and medication synchronization o Take medications as prescribed o Report any questions or concerns to PharmD and/or provider(s)  Please see past updates related to this goal by clicking on the "Past Updates" button in the selected goal         Patient verbalizes understanding of instructions provided today.   Telephone follow up appointment with pharmacy team member scheduled for: 03/09/20 @ 4:00 PM  Beryle Flock, PharmD Clinical Pharmacist [facility] [phone]  Diabetes Mellitus and Nutrition, Adult When you have diabetes (diabetes mellitus), it is very important to have healthy eating habits because your blood sugar (glucose) levels are greatly affected by what you eat and drink. Eating healthy foods in the appropriate amounts, at about the same times every day, can help you:  Control your blood glucose.  Lower your risk of heart disease.  Improve your blood pressure.  Reach or maintain a healthy weight. Every person with diabetes is different, and each person has different needs for a meal plan. Your health care provider may recommend that you work with a diet and nutrition specialist (dietitian) to make a meal plan that is best for you. Your meal plan may vary depending on factors such as:  The calories you need.  The medicines you take.  Your weight.  Your blood glucose, blood pressure, and cholesterol levels.  Your activity level.  Other health conditions you have, such as heart or kidney disease. How do carbohydrates affect me? Carbohydrates, also called carbs, affect your blood glucose level more than any other type of food.  Eating carbs naturally raises the amount of glucose in your blood. Carb counting is a method for keeping track of how many carbs you eat. Counting carbs is important to keep your blood glucose at a healthy level, especially if you use insulin or take certain oral diabetes medicines. It is important to know how many carbs you can safely have in each meal. This is different for every person. Your dietitian can help you calculate how many carbs you should have at each meal and for each snack. Foods that contain carbs include:  Bread, cereal, rice, pasta, and crackers.  Potatoes and corn.  Peas, beans, and lentils.  Milk and yogurt.  Fruit and juice.  Desserts, such as cakes, cookies, ice cream, and candy. How does alcohol affect me? Alcohol can cause a sudden decrease in blood glucose (hypoglycemia), especially if you use insulin or take certain oral diabetes medicines. Hypoglycemia can be a life-threatening condition. Symptoms of hypoglycemia (sleepiness, dizziness, and confusion) are similar to symptoms of having too much alcohol. If your health care provider says that alcohol is safe for you, follow these guidelines:  Limit alcohol intake to no more than 1 drink per day for nonpregnant women and 2 drinks per day for men. One drink equals 12 oz of beer, 5 oz of wine, or 1 oz of hard liquor.  Do not drink on  an empty stomach.  Keep yourself hydrated with water, diet soda, or unsweetened iced tea.  Keep in mind that regular soda, juice, and other mixers may contain a lot of sugar and must be counted as carbs. What are tips for following this plan?  Reading food labels  Start by checking the serving size on the "Nutrition Facts" label of packaged foods and drinks. The amount of calories, carbs, fats, and other nutrients listed on the label is based on one serving of the item. Many items contain more than one serving per package.  Check the total grams (g) of carbs in one serving. You can  calculate the number of servings of carbs in one serving by dividing the total carbs by 15. For example, if a food has 30 g of total carbs, it would be equal to 2 servings of carbs.  Check the number of grams (g) of saturated and trans fats in one serving. Choose foods that have low or no amount of these fats.  Check the number of milligrams (mg) of salt (sodium) in one serving. Most people should limit total sodium intake to less than 2,300 mg per day.  Always check the nutrition information of foods labeled as "low-fat" or "nonfat". These foods may be higher in added sugar or refined carbs and should be avoided.  Talk to your dietitian to identify your daily goals for nutrients listed on the label. Shopping  Avoid buying canned, premade, or processed foods. These foods tend to be high in fat, sodium, and added sugar.  Shop around the outside edge of the grocery store. This includes fresh fruits and vegetables, bulk grains, fresh meats, and fresh dairy. Cooking  Use low-heat cooking methods, such as baking, instead of high-heat cooking methods like deep frying.  Cook using healthy oils, such as olive, canola, or sunflower oil.  Avoid cooking with butter, cream, or high-fat meats. Meal planning  Eat meals and snacks regularly, preferably at the same times every day. Avoid going long periods of time without eating.  Eat foods high in fiber, such as fresh fruits, vegetables, beans, and whole grains. Talk to your dietitian about how many servings of carbs you can eat at each meal.  Eat 4-6 ounces (oz) of lean protein each day, such as lean meat, chicken, fish, eggs, or tofu. One oz of lean protein is equal to: ? 1 oz of meat, chicken, or fish. ? 1 egg. ?  cup of tofu.  Eat some foods each day that contain healthy fats, such as avocado, nuts, seeds, and fish. Lifestyle  Check your blood glucose regularly.  Exercise regularly as told by your health care provider. This may  include: ? 150 minutes of moderate-intensity or vigorous-intensity exercise each week. This could be brisk walking, biking, or water aerobics. ? Stretching and doing strength exercises, such as yoga or weightlifting, at least 2 times a week.  Take medicines as told by your health care provider.  Do not use any products that contain nicotine or tobacco, such as cigarettes and e-cigarettes. If you need help quitting, ask your health care provider.  Work with a Veterinary surgeoncounselor or diabetes educator to identify strategies to manage stress and any emotional and social challenges. Questions to ask a health care provider  Do I need to meet with a diabetes educator?  Do I need to meet with a dietitian?  What number can I call if I have questions?  When are the best times to check my blood glucose?  Where to find more information:  American Diabetes Association: diabetes.org  Academy of Nutrition and Dietetics: www.eatright.[employer] of Diabetes and Digestive and Kidney Diseases (NIH): CarFlippers.tn Summary  A healthy meal plan will help you control your blood glucose and maintain a healthy lifestyle.  Working with a diet and nutrition specialist (dietitian) can help you make a meal plan that is best for you.  Keep in mind that carbohydrates (carbs) and alcohol have immediate effects on your blood glucose levels. It is important to count carbs and to use alcohol carefully. This information is not intended to replace advice given to you by your health care provider. Make sure you discuss any questions you have with your health care provider. Document Revised: 05/24/2017 Document Reviewed: 07/16/2016 Elsevier Patient Education  2020 [employer].

## 2020-02-02 ENCOUNTER — Telehealth: Payer: Self-pay

## 2020-02-04 NOTE — Chronic Care Management (AMB) (Signed)
    Chronic Care Management Pharmacy Assistant   Name: Shannon [name]  MRN: [id number] DOB: May 20, 1956  Reason for Encounter: Medication Review/ Patient Assistance Follow up  PCP : [name], FNP  Allergies:  No Known Allergies  Medications: Outpatient Encounter Medications as of 02/02/2020  Medication Sig  . acetaminophen (TYLENOL) 500 MG tablet Take 500-1,000 mg by mouth every 6 (six) hours as needed for headache (or pain). (Patient not taking: Reported on 01/24/2020)  . atorvastatin (LIPITOR) 20 MG tablet Take 1 tablet (20 mg total) by mouth daily. Hold for next 2 weeks  . Blood Glucose Monitoring Suppl (ONE TOUCH ULTRA MINI) w/Device KIT 1 each by Does not apply route 4 (four) times daily -  before meals and at bedtime.  . Cholecalciferol (VITAMIN D-3 PO) Take 1 capsule by mouth daily.  Marland Kitchen glucose blood (ONETOUCH VERIO) test strip Use as instructed  . glucose blood test strip Use as instructed  . insulin degludec (TRESIBA FLEXTOUCH) 100 UNIT/ML FlexTouch Pen Inject 0.21 mLs (21 Units total) into the skin daily.  . Lancets (ONETOUCH ULTRASOFT) lancets Use as instructed to check blood sugars 4 times daily E11.9  . lisinopril (ZESTRIL) 10 MG tablet Take 1 tablet (10 mg total) by mouth daily.  . metFORMIN (GLUCOPHAGE XR) 750 MG 24 hr tablet Take 1 tablet (750 mg total) by mouth daily with breakfast.  . Semaglutide,0.25 or 0.'5MG'$ /DOS, (OZEMPIC, 0.25 OR 0.5 MG/DOSE,) 2 MG/1.5ML SOPN Inject 0.5 mg into the skin once a week.   No facility-administered encounter medications on file as of 02/02/2020.    Current Diagnosis: Patient Active Problem List   Diagnosis Date Noted  . Vitreous floaters of left eye 12/29/2019  . Other long term (current) drug therapy 11/16/2019  . Snores 10/27/2019  . Moderate nonproliferative diabetic retinopathy of right eye with macular edema ([place]) 10/06/2019  . Moderate nonproliferative diabetic retinopathy of left eye with macular edema associated with  type 2 diabetes mellitus ([place]) 10/06/2019  . Retinal exudates and deposits 10/06/2019  . Cough 11/20/2018  . Other fatigue 11/20/2018  . COVID-19 virus infection 10/13/2018  . Hypokalemia 10/13/2018  . Pyelonephritis 06/17/2018  . Essential hypertension 05/30/2018     Follow-Up:  Patient [name] to follow up on patient assistance medication Ozempic 0.5 mg. Spoke with [place], there were questions on application that needed to be clarified on income status. Clarification received, application approved and processed 02/02/2020, will take 10-14 days for delivery, medication should be delivered to office on or between 02/16/2020- 02/22/2020. If medication is not received by then we can call on 02/17/2020 for a 30 day free voucher for patient to take to their local pharmacy. Pharmacist [name] is aware, patient is also aware.  Faxing application to [facility] for patient assistance in diabetes supplies for [employer] meter and lancets. Faxing demographics, prescription and notes to [phone]. Patient was also notified by pharmacist that we will contact patent with delivery status.  [name], [place] Pharmacist Assistant [phone]

## 2020-02-09 ENCOUNTER — Other Ambulatory Visit: Payer: Self-pay

## 2020-02-09 ENCOUNTER — Ambulatory Visit (INDEPENDENT_AMBULATORY_CARE_PROVIDER_SITE_OTHER): Payer: HMO | Admitting: Ophthalmology

## 2020-02-09 ENCOUNTER — Encounter (INDEPENDENT_AMBULATORY_CARE_PROVIDER_SITE_OTHER): Payer: HMO | Admitting: Ophthalmology

## 2020-02-09 ENCOUNTER — Encounter (INDEPENDENT_AMBULATORY_CARE_PROVIDER_SITE_OTHER): Payer: Self-pay | Admitting: Ophthalmology

## 2020-02-09 DIAGNOSIS — E113311 Type 2 diabetes mellitus with moderate nonproliferative diabetic retinopathy with macular edema, right eye: Secondary | ICD-10-CM

## 2020-02-09 DIAGNOSIS — E113312 Type 2 diabetes mellitus with moderate nonproliferative diabetic retinopathy with macular edema, left eye: Secondary | ICD-10-CM

## 2020-02-09 MED ORDER — BEVACIZUMAB CHEMO INJECTION 1.25MG/0.05ML SYRINGE FOR KALEIDOSCOPE
1.2500 mg | INTRAVITREAL | Status: AC | PRN
  Administered 2020-02-09: 1.25 mg via INTRAVITREAL

## 2020-02-09 NOTE — Assessment & Plan Note (Signed)
This, CSME has improved and in fact resolved will monitor by observation clinically and by OCT hereafter

## 2020-02-09 NOTE — Progress Notes (Signed)
02/09/2020     CHIEF COMPLAINT Patient presents for Retina Follow Up   HISTORY OF PRESENT ILLNESS: Shannon [name] is a 64 y.o. female who presents to the clinic today for:   HPI    Retina Follow Up    Patient presents with  Diabetic Retinopathy.  In right eye.  This started 6 weeks ago.  Severity is mild.  Duration of 6 weeks.  Since onset it is stable.          Comments    6 Week Diabetic F/U OU, poss Avastin OD   Patient is scheduled for sleep testing September 8  Pt denies noticeable changes to [place] since last visit. Pt denies ocular pain, flashes of light, or floaters OU.  LBS: 128 yesterday       Last edited by [name], MD on 02/09/2020 10:44 AM. (History)      Referring physician: [name], [address] [place],  [place] [address]  HISTORICAL INFORMATION:   Selected notes from the [facility]    Lab Results  Component Value Date   HGBA1C 7.4 (H) 11/16/2019     CURRENT MEDICATIONS: No current outpatient medications on file. (Ophthalmic Drugs)   No current facility-administered medications for this visit. (Ophthalmic Drugs)   Current Outpatient Medications (Other)  Medication Sig  . acetaminophen (TYLENOL) 500 MG tablet Take 500-1,000 mg by mouth every 6 (six) hours as needed for headache (or pain). (Patient not taking: Reported on 01/24/2020)  . atorvastatin (LIPITOR) 20 MG tablet Take 1 tablet (20 mg total) by mouth daily. Hold for next 2 weeks  . Blood Glucose Monitoring Suppl (ONE TOUCH ULTRA MINI) w/Device KIT 1 each by Does not apply route 4 (four) times daily -  before meals and at bedtime.  . Cholecalciferol (VITAMIN D-3 PO) Take 1 capsule by mouth daily.  Marland Kitchen glucose blood (ONETOUCH VERIO) test strip Use as instructed  . glucose blood test strip Use as instructed  . insulin degludec (TRESIBA FLEXTOUCH) 100 UNIT/ML FlexTouch Pen Inject 0.21 mLs (21 Units total) into the skin daily.  . Lancets (ONETOUCH  ULTRASOFT) lancets Use as instructed to check blood sugars 4 times daily E11.9  . lisinopril (ZESTRIL) 10 MG tablet Take 1 tablet (10 mg total) by mouth daily.  . metFORMIN (GLUCOPHAGE XR) 750 MG 24 hr tablet Take 1 tablet (750 mg total) by mouth daily with breakfast.  . Semaglutide,0.25 or 0.5MG/DOS, (OZEMPIC, 0.25 OR 0.5 MG/DOSE,) 2 MG/1.5ML SOPN Inject 0.5 mg into the skin once a week.   No current facility-administered medications for this visit. (Other)      REVIEW OF SYSTEMS:    ALLERGIES No Known Allergies  PAST MEDICAL HISTORY Past Medical History:  Diagnosis Date  . Diabetes mellitus without complication ([place])   . Hypercholesteremia   . Hypertension    Past Surgical History:  Procedure Laterality Date  . ABDOMINAL HYSTERECTOMY    . CATARACT EXTRACTION Bilateral   . CESAREAN SECTION    . MYOMECTOMY      FAMILY HISTORY Family History  Problem Relation Age of Onset  . Hypertension Mother   . Diabetes Mother   . Stroke Mother   . Diabetes Father   . Hypertension Father   . Stroke Father   . Hypertension Son   . Diabetes Son   . Breast cancer Neg Hx     SOCIAL HISTORY Social History   Tobacco Use  . Smoking status: Former  Smoker    Quit date: 2006    Years since quitting: 15.6  . Smokeless tobacco: Never Used  . Tobacco comment: quit 10 yrs  Vaping Use  . Vaping Use: Never used  Substance Use Topics  . Alcohol use: Yes    Comment: 1 glass of wine sometimes daily   . Drug use: Never         OPHTHALMIC EXAM:  Base Eye Exam    Visual Acuity (ETDRS)      Right Left   Dist [place] 20/30 +2 20/25 +2   Dist ph [place] NI        Tonometry (Tonopen, 9:55 AM)      Right Left   Pressure 09 12       Pupils      Pupils Dark Light Shape React APD   Right PERRL 4 3 Round Brisk None   Left PERRL 4 3 Round Brisk None       Visual Fields (Counting fingers)      Left Right    Full Full       Extraocular Movement      Right Left    Full Full        Neuro/Psych    Oriented x3: Yes   Mood/Affect: Normal       Dilation    Both eyes: 1.0% Mydriacyl, 2.5% Phenylephrine @ 9:55 AM        Slit Lamp and Fundus Exam    External Exam      Right Left   External Normal Normal       Slit Lamp Exam      Right Left   Lids/Lashes Normal Normal   Conjunctiva/Sclera White and quiet White and quiet   Cornea Clear Clear   Anterior Chamber Deep and quiet Deep and quiet   Iris Round and reactive Round and reactive   Lens Posterior chamber intraocular lens Posterior chamber intraocular lens   Anterior Vitreous Normal Normal       Fundus Exam      Right Left   Posterior Vitreous Normal Normal, small vitreous debris, no cells, no overt PVD   Disc Normal Normal   C/D Ratio 0.2 0.2   Macula Clinically significant macular edema, Exudates, Macular thickening, Mild clinically significant macular edema Microaneurysms, no detectable clinically significant macular edema   Vessels NPDR- Moderate NPDR- Moderate   Periphery Normal Normal          IMAGING AND PROCEDURES  Imaging and Procedures for 02/09/20  OCT, Retina - OU - Both Eyes       Right Eye Quality was good. Scan locations included subfoveal. Central Foveal Thickness: 357. Progression has worsened.   Left Eye Quality was good. Scan locations included subfoveal. Central Foveal Thickness: 267. Progression has improved. Findings include normal foveal contour.   Notes CSME OD has increased superotemporal margin of the fovea.  This corresponds with exudates seen on clinical examination  OS CSME has improved and even stabilized and most likely we can observe henceforth       Intravitreal Injection, Pharmacologic Agent - OD - Right Eye       Time Out 02/09/2020. 10:46 AM. Confirmed correct patient, procedure, site, and patient consented.   Anesthesia Topical anesthesia was used. Anesthetic medications included [place] 3.5%.   Procedure Preparation included Ofloxacin , 10%  betadine to eyelids, 5% betadine to ocular surface. A supplied needle was used.   Injection:  1.25 mg Bevacizumab (AVASTIN) SOLN  NDC: [phone], Lot: [id number]   Route: Intravitreal, Site: Right Eye, Waste: 0 mg  Post-op Post injection exam found visual acuity of at least counting fingers. The patient tolerated the procedure well. There were no complications. The patient received written and verbal post procedure care education. Post injection medications were not given.                 ASSESSMENT/PLAN:  Moderate nonproliferative diabetic retinopathy of left eye with macular edema associated with type 2 diabetes mellitus (HCC) This, CSME has improved and in fact resolved will monitor by observation clinically and by OCT hereafter  Moderate nonproliferative diabetic retinopathy of right eye with macular edema (HCC) CSME OD did not worsen at 6-week follow-up examination, will repeat injection today and exam in 5 weeks.  This does raise the specter of possible worsening of macular perfusion oxygenation if undetected sleep apnea is present.  Sleep testing is scheduled September 8      ICD-10-CM   1. Moderate nonproliferative diabetic retinopathy of right eye with macular edema associated with type 2 diabetes mellitus (HCC)  E11.3311 OCT, Retina - OU - Both Eyes    Intravitreal Injection, Pharmacologic Agent - OD - Right Eye    Bevacizumab (AVASTIN) SOLN 1.25 mg  2. Moderate nonproliferative diabetic retinopathy of left eye with macular edema associated with type 2 diabetes mellitus (HCC)  H41.9379 OCT, Retina - OU - Both Eyes    1.  2.  3.  Ophthalmic Meds Ordered this visit:  Meds ordered this encounter  Medications  . Bevacizumab (AVASTIN) SOLN 1.25 mg       Return in about 5 weeks (around 03/15/2020) for dilate, OD, AVASTIN OCT.  There are no Patient Instructions on file for this visit.   Explained the diagnoses, plan, and follow up with the patient and they  expressed understanding.  Patient expressed understanding of the importance of proper follow up care.   Clent Demark Marquette Piontek M.D. Diseases & Surgery of the Retina and Vitreous [facility] [place] 02/09/20     Abbreviations: M myopia (nearsighted); A astigmatism; H hyperopia (farsighted); P presbyopia; Mrx spectacle prescription;  CTL contact lenses; OD right eye; OS left eye; OU both eyes  XT exotropia; ET esotropia; PEK punctate epithelial keratitis; PEE punctate epithelial erosions; DES dry eye syndrome; MGD meibomian gland dysfunction; ATs artificial tears; PFAT's preservative free artificial tears; [place] nuclear sclerotic cataract; PSC posterior subcapsular cataract; ERM epi-retinal membrane; PVD posterior vitreous detachment; RD retinal detachment; DM diabetes mellitus; DR diabetic retinopathy; NPDR non-proliferative diabetic retinopathy; PDR proliferative diabetic retinopathy; CSME clinically significant macular edema; DME diabetic macular edema; dbh dot blot hemorrhages; CWS cotton wool spot; POAG primary open angle glaucoma; C/D cup-to-disc ratio; HVF humphrey visual field; GVF goldmann visual field; OCT optical coherence tomography; IOP intraocular pressure; BRVO Branch retinal vein occlusion; CRVO central retinal vein occlusion; CRAO central retinal artery occlusion; BRAO branch retinal artery occlusion; RT retinal tear; SB scleral buckle; PPV pars plana vitrectomy; VH Vitreous hemorrhage; PRP panretinal laser photocoagulation; IVK intravitreal kenalog; VMT vitreomacular traction; MH Macular hole;  NVD neovascularization of the disc; NVE neovascularization elsewhere; AREDS age related eye disease study; ARMD age related macular degeneration; POAG primary open angle glaucoma; EBMD epithelial/anterior basement membrane dystrophy; ACIOL anterior chamber intraocular lens; IOL intraocular lens; PCIOL posterior chamber intraocular lens; Phaco/IOL phacoemulsification with intraocular lens placement;  [place] photorefractive keratectomy; LASIK laser assisted in situ keratomileusis; HTN hypertension; DM diabetes mellitus; COPD chronic obstructive pulmonary disease

## 2020-02-09 NOTE — Assessment & Plan Note (Signed)
CSME OD did not worsen at 6-week follow-up examination, will repeat injection today and exam in 5 weeks.  This does raise the specter of possible worsening of macular perfusion oxygenation if undetected sleep apnea is present.  Sleep testing is scheduled September 8

## 2020-02-16 ENCOUNTER — Encounter: Payer: Self-pay | Admitting: Nurse Practitioner

## 2020-02-16 ENCOUNTER — Other Ambulatory Visit: Payer: Self-pay

## 2020-02-16 ENCOUNTER — Ambulatory Visit (INDEPENDENT_AMBULATORY_CARE_PROVIDER_SITE_OTHER): Payer: HMO | Admitting: Nurse Practitioner

## 2020-02-16 VITALS — BP 130/70 | HR 72 | Temp 97.9°F | Ht 62.4 in | Wt 154.4 lb

## 2020-02-16 DIAGNOSIS — Z1159 Encounter for screening for other viral diseases: Secondary | ICD-10-CM | POA: Diagnosis not present

## 2020-02-16 DIAGNOSIS — Z794 Long term (current) use of insulin: Secondary | ICD-10-CM

## 2020-02-16 DIAGNOSIS — E119 Type 2 diabetes mellitus without complications: Secondary | ICD-10-CM | POA: Diagnosis not present

## 2020-02-16 NOTE — Progress Notes (Signed)
I,Yamilka Roman [employer] as a Education administrator for [employer], FNP.,have documented all relevant documentation on the behalf of [name], FNP,as directed by  [name], FNP while in the presence of [name], [place]. This visit occurred during the SARS-CoV-2 public health emergency.  Safety protocols were in place, including screening questions prior to the visit, additional usage of staff PPE, and extensive cleaning of exam room while observing appropriate contact time as indicated for disinfecting solutions.  Subjective:     Patient ID: Shannon [name] , female    DOB: Dec 27, 1955 , 64 y.o.   MRN: [id number]   Chief Complaint  Patient presents with  . Diabetes    HPI  Wt Readings from Last 3 Encounters: 02/16/20 : 154 lb 6.4 oz (70 kg) 01/06/20 : 154 lb 5 oz (70 kg) 12/15/19 : 153 lb 9.6 oz (69.7 kg)  She is having injections to right eye every 5 weeks until she is improving.   Diabetes She presents for her follow-up diabetic visit. She has type 2 diabetes mellitus. Her disease course has been stable. There are no hypoglycemic associated symptoms. Pertinent negatives for hypoglycemia include no confusion, dizziness or nervousness/anxiousness. There are no diabetic associated symptoms. Pertinent negatives for diabetes include no chest pain, no fatigue, no polydipsia, no polyphagia and no polyuria. Symptoms are stable. Risk factors for coronary artery disease include sedentary lifestyle and obesity. Current diabetic treatment includes oral agent (dual therapy). She is compliant with treatment all of the time. She has not had a previous visit with a dietitian. She participates in exercise every other day. (Blood sugars ranging 120 - 170 (once) ) An ACE inhibitor/angiotensin II receptor blocker is being taken. She does not see a podiatrist.Eye exam is current.     Past Medical History:  Diagnosis Date  . Diabetes mellitus without complication ([place])   . Hypercholesteremia   .  Hypertension      Family History  Problem Relation Age of Onset  . Hypertension Mother   . Diabetes Mother   . Stroke Mother   . Diabetes Father   . Hypertension Father   . Stroke Father   . Hypertension Son   . Diabetes Son   . Breast cancer Neg Hx      Current Outpatient Medications:  .  atorvastatin (LIPITOR) 20 MG tablet, Take 1 tablet (20 mg total) by mouth daily. Hold for next 2 weeks, Disp: 90 tablet, Rfl: 1 .  Blood Glucose Monitoring Suppl (ONE TOUCH ULTRA MINI) w/Device KIT, 1 each by Does not apply route 4 (four) times daily -  before meals and at bedtime., Disp: 1 kit, Rfl: 0 .  Cholecalciferol (VITAMIN D-3 PO), Take 1 capsule by mouth daily., Disp: , Rfl:  .  glucose blood test strip, Use as instructed, Disp: 100 each, Rfl: 5 .  insulin degludec (TRESIBA FLEXTOUCH) 100 UNIT/ML FlexTouch Pen, Inject 0.21 mLs (21 Units total) into the skin daily., Disp: 15 pen, Rfl: 1 .  Lancets (ONETOUCH ULTRASOFT) lancets, Use as instructed to check blood sugars 4 times daily E11.9, Disp: 300 each, Rfl: 3 .  lisinopril (ZESTRIL) 10 MG tablet, Take 1 tablet (10 mg total) by mouth daily., Disp: 90 tablet, Rfl: 0 .  metFORMIN (GLUCOPHAGE XR) 750 MG 24 hr tablet, Take 1 tablet (750 mg total) by mouth daily with breakfast., Disp: 90 tablet, Rfl: 0 .  blood glucose meter kit and supplies KIT, Dispense based on patient and insurance preference. Use 3 times daily as directed  to check blood sugar. Dx code e11.65, Disp: 1 each, Rfl: 9 .  glucose blood test strip, Use as instructed to check blood sugar 3 times a day. Dx code e11.65, Disp: 100 each, Rfl: 12 .  Semaglutide,0.25 or 0.5MG/DOS, (OZEMPIC, 0.25 OR 0.5 MG/DOSE,) 2 MG/1.5ML SOPN, Inject 0.5 mg into the skin once a week., Disp: 9.5 mL, Rfl: 1   No Known Allergies   Review of Systems  Constitutional: Negative.  Negative for fatigue.  Respiratory: Negative.   Cardiovascular: Negative.  Negative for chest pain, palpitations and leg swelling.   Gastrointestinal: Negative.   Endocrine: Negative.  Negative for polydipsia, polyphagia and polyuria.  Skin: Negative.   Neurological: Negative.  Negative for dizziness.  Psychiatric/Behavioral: Negative for confusion. The patient is not nervous/anxious.      Today's Vitals   02/16/20 0933  BP: 130/70  Pulse: 72  Temp: 97.9 F (36.6 C)  TempSrc: Oral  Weight: 154 lb 6.4 oz (70 kg)  Height: 5' 2.4" (1.585 m)  PainSc: 0-No pain   Body mass index is 27.88 kg/m.   Objective:  Physical Exam Vitals reviewed.  Constitutional:      General: She is not in acute distress.    Appearance: Normal appearance. She is well-developed.  Cardiovascular:     Rate and Rhythm: Normal rate and regular rhythm.     Pulses: Normal pulses.     Heart sounds: Normal heart sounds. No murmur heard.   Pulmonary:     Effort: Pulmonary effort is normal. No respiratory distress.     Breath sounds: Normal breath sounds. No wheezing.  Musculoskeletal:        General: Normal range of motion.  Skin:    General: Skin is warm and dry.     Capillary Refill: Capillary refill takes less than 2 seconds.  Neurological:     General: No focal deficit present.     Mental Status: She is alert and oriented to person, place, and time.     Cranial Nerves: No cranial nerve deficit.  Psychiatric:        Mood and Affect: Mood normal.        Behavior: Behavior normal.        Thought Content: Thought content normal.        Judgment: Judgment normal.         Assessment And Plan:     1. Type 2 diabetes mellitus without complication, with long-term current use of insulin ([place])  She is tolerating her Ozempic well in which she is getting with patient assistance  Continue checking your blood sugary 4 times a day  She is on a statin due to her diabetes management - Lipid panel - CMP14+EGFR  2. Encounter for hepatitis C screening test for low risk patient Will check Hepatitis C screening due to recent  recommendations to screen all adults 18 years and older - Hepatitis C antibody     Patient was given opportunity to ask questions. Patient verbalized understanding of the plan and was able to repeat key elements of the plan. All questions were answered to their satisfaction.   Teola Bradley, FNP, have reviewed all documentation for this visit. The documentation on 03/16/20 for the exam, diagnosis, procedures, and orders are all accurate and complete.   THE PATIENT IS ENCOURAGED TO PRACTICE SOCIAL DISTANCING DUE TO THE COVID-19 PANDEMIC.

## 2020-02-16 NOTE — Patient Instructions (Signed)
Diabetes Mellitus and Exercise Exercising regularly is important for your overall health, especially when you have diabetes (diabetes mellitus). Exercising is not only about losing weight. It has many other health benefits, such as increasing muscle strength and bone density and reducing body fat and stress. This leads to improved fitness, flexibility, and endurance, all of which result in better overall health. Exercise has additional benefits for people with diabetes, including:  Reducing appetite.  Helping to lower and control blood glucose.  Lowering blood pressure.  Helping to control amounts of fatty substances (lipids) in the blood, such as cholesterol and triglycerides.  Helping the body to respond better to insulin (improving insulin sensitivity).  Reducing how much insulin the body needs.  Decreasing the risk for heart disease by: ? Lowering cholesterol and triglyceride levels. ? Increasing the levels of good cholesterol. ? Lowering blood glucose levels. What is my activity plan? Your health care provider or certified diabetes educator can help you make a plan for the type and frequency of exercise (activity plan) that works for you. Make sure that you:  Do at least 150 minutes of moderate-intensity or vigorous-intensity exercise each week. This could be brisk walking, biking, or water aerobics. ? Do stretching and strength exercises, such as yoga or weightlifting, at least 2 times a week. ? Spread out your activity over at least 3 days of the week.  Get some form of physical activity every day. ? Do not go more than 2 days in a row without some kind of physical activity. ? Avoid being inactive for more than 30 minutes at a time. Take frequent breaks to walk or stretch.  Choose a type of exercise or activity that you enjoy, and set realistic goals.  Start slowly, and gradually increase the intensity of your exercise over time. What do I need to know about managing my  diabetes?   Check your blood glucose before and after exercising. ? If your blood glucose is 240 mg/dL (13.3 mmol/L) or higher before you exercise, check your urine for ketones. If you have ketones in your urine, do not exercise until your blood glucose returns to normal. ? If your blood glucose is 100 mg/dL (5.6 mmol/L) or lower, eat a snack containing 15-20 grams of carbohydrate. Check your blood glucose 15 minutes after the snack to make sure that your level is above 100 mg/dL (5.6 mmol/L) before you start your exercise.  Know the symptoms of low blood glucose (hypoglycemia) and how to treat it. Your risk for hypoglycemia increases during and after exercise. Common symptoms of hypoglycemia can include: ? Hunger. ? Anxiety. ? Sweating and feeling clammy. ? Confusion. ? Dizziness or feeling light-headed. ? Increased heart rate or palpitations. ? Blurry vision. ? Tingling or numbness around the mouth, lips, or tongue. ? Tremors or shakes. ? Irritability.  Keep a rapid-acting carbohydrate snack available before, during, and after exercise to help prevent or treat hypoglycemia.  Avoid injecting insulin into areas of the body that are going to be exercised. For example, avoid injecting insulin into: ? The arms, when playing tennis. ? The legs, when jogging.  Keep records of your exercise habits. Doing this can help you and your health care provider adjust your diabetes management plan as needed. Write down: ? Food that you eat before and after you exercise. ? Blood glucose levels before and after you exercise. ? The type and amount of exercise you have done. ? When your insulin is expected to peak, if you use   insulin. Avoid exercising at times when your insulin is peaking.  When you start a new exercise or activity, work with your health care provider to make sure the activity is safe for you, and to adjust your insulin, medicines, or food intake as needed.  Drink plenty of water while  you exercise to prevent dehydration or heat stroke. Drink enough fluid to keep your urine clear or pale yellow. Summary  Exercising regularly is important for your overall health, especially when you have diabetes (diabetes mellitus).  Exercising has many health benefits, such as increasing muscle strength and bone density and reducing body fat and stress.  Your health care provider or certified diabetes educator can help you make a plan for the type and frequency of exercise (activity plan) that works for you.  When you start a new exercise or activity, work with your health care provider to make sure the activity is safe for you, and to adjust your insulin, medicines, or food intake as needed. This information is not intended to replace advice given to you by your health care provider. Make sure you discuss any questions you have with your health care provider. Document Revised: 01/03/2017 Document Reviewed: 11/21/2015 Elsevier Patient Education  2020 [employer]  

## 2020-02-17 ENCOUNTER — Telehealth: Payer: Self-pay

## 2020-02-17 LAB — LIPID PANEL
Chol/HDL Ratio: 3.5 ratio (ref 0.0–4.4)
Cholesterol, Total: 192 mg/dL (ref 100–199)
HDL: 55 mg/dL (ref >39)
LDL Chol Calc (NIH): 102 mg/dL — ABNORMAL HIGH (ref 0–99)
Triglycerides: 202 mg/dL — ABNORMAL HIGH (ref 0–149)
VLDL Cholesterol Cal: 35 mg/dL (ref 5–40)

## 2020-02-17 LAB — CMP14+EGFR
ALT: 21 IU/L (ref 0–32)
AST: 18 IU/L (ref 0–40)
Albumin/Globulin Ratio: 2.1 (ref 1.2–2.2)
Albumin: 4.9 g/dL — ABNORMAL HIGH (ref 3.8–4.8)
Alkaline Phosphatase: 98 IU/L (ref 48–121)
BUN/Creatinine Ratio: 33 — ABNORMAL HIGH (ref 12–28)
BUN: 18 mg/dL (ref 8–27)
Bilirubin Total: 0.3 mg/dL (ref 0.0–1.2)
CO2: 23 mmol/L (ref 20–29)
Calcium: 9.9 mg/dL (ref 8.7–10.3)
Chloride: 102 mmol/L (ref 96–106)
Creatinine, Ser: 0.55 mg/dL — ABNORMAL LOW (ref 0.57–1.00)
GFR calc Af Amer: 114 mL/min/{1.73_m2} (ref >59)
GFR calc non Af Amer: 99 mL/min/{1.73_m2} (ref >59)
Globulin, Total: 2.3 g/dL (ref 1.5–4.5)
Glucose: 153 mg/dL — ABNORMAL HIGH (ref 65–99)
Potassium: 4.4 mmol/L (ref 3.5–5.2)
Sodium: 141 mmol/L (ref 134–144)
Total Protein: 7.2 g/dL (ref 6.0–8.5)

## 2020-02-17 LAB — HEPATITIS C ANTIBODY: Hep C Virus Ab: <0.1 s/co ratio (ref 0.0–0.9)

## 2020-02-17 NOTE — Chronic Care Management (AMB) (Signed)
    Chronic Care Management Pharmacy Assistant   Name: Shannon [name]  MRN: [id number] DOB: September 19, 1955  Reason for Encounter: Patient Assistance Coordination  PCP : Shannon Brine, FNP  Allergies:  No Known Allergies  Medications: Outpatient Encounter Medications as of 02/17/2020  Medication Sig  . atorvastatin (LIPITOR) 20 MG tablet Take 1 tablet (20 mg total) by mouth daily. Hold for next 2 weeks  . Blood Glucose Monitoring Suppl (ONE TOUCH ULTRA MINI) w/Device KIT 1 each by Does not apply route 4 (four) times daily -  before meals and at bedtime.  . Cholecalciferol (VITAMIN D-3 PO) Take 1 capsule by mouth daily.  Marland Kitchen glucose blood (ONETOUCH VERIO) test strip Use as instructed  . glucose blood test strip Use as instructed  . insulin degludec (TRESIBA FLEXTOUCH) 100 UNIT/ML FlexTouch Pen Inject 0.21 mLs (21 Units total) into the skin daily.  . Lancets (ONETOUCH ULTRASOFT) lancets Use as instructed to check blood sugars 4 times daily E11.9  . lisinopril (ZESTRIL) 10 MG tablet Take 1 tablet (10 mg total) by mouth daily.  . metFORMIN (GLUCOPHAGE XR) 750 MG 24 hr tablet Take 1 tablet (750 mg total) by mouth daily with breakfast.  . Semaglutide,0.25 or 0.5MG/DOS, (OZEMPIC, 0.25 OR 0.5 MG/DOSE,) 2 MG/1.5ML SOPN Inject 0.5 mg into the skin once a week.   No facility-administered encounter medications on file as of 02/17/2020.    Current Diagnosis: Patient Active Problem List   Diagnosis Date Noted  . Vitreous floaters of left eye 12/29/2019  . Other long term (current) drug therapy 11/16/2019  . Snores 10/27/2019  . Moderate nonproliferative diabetic retinopathy of right eye with macular edema ([place]) 10/06/2019  . Moderate nonproliferative diabetic retinopathy of left eye with macular edema associated with type 2 diabetes mellitus ([place]) 10/06/2019  . Retinal exudates and deposits 10/06/2019  . Cough 11/20/2018  . Other fatigue 11/20/2018  . COVID-19 virus infection 10/13/2018  .  Hypokalemia 10/13/2018  . Pyelonephritis 06/17/2018     Follow-Up:  Patient Assistance Coordination- Called [employer] checking on patients DM supplies order, spoke with Shannon [name], she stated forms were received but they are waiting on patient to return call to answer some questions. Representative informed me that they called her on 02/12/20 and 02/15/20, both times leaving a vm to return call. Advised that I will follow up with patient and due to her not being aware she was going to get a call from this company and/or not being familiar with the number that comes up she may answer. Left message for patient to return my call, she will need to call [phone] and ask for the Document collection group. Shannon [name], CPP aware.   02/23/20- Called patient again to follow up on call regarding [employer] call needed to get DM supplies delivered. No answer, left message to return call.   Shannon [name], [place] Pharmacist Assistant [phone]

## 2020-02-24 ENCOUNTER — Encounter: Payer: Self-pay | Admitting: Nurse Practitioner

## 2020-02-25 ENCOUNTER — Other Ambulatory Visit: Payer: Self-pay

## 2020-02-25 ENCOUNTER — Other Ambulatory Visit: Payer: HMO

## 2020-02-25 ENCOUNTER — Telehealth: Payer: Self-pay | Admitting: *Deleted

## 2020-02-25 DIAGNOSIS — Z794 Long term (current) use of insulin: Secondary | ICD-10-CM

## 2020-02-25 DIAGNOSIS — E119 Type 2 diabetes mellitus without complications: Secondary | ICD-10-CM

## 2020-02-25 NOTE — Chronic Care Management (AMB) (Signed)
  Care Management   Note  02/25/2020 Name: Shannon [name] MRN: [id number] DOB: [birth year]  Shannon [name] is a 64 y.o. year old female who is a primary care patient of Arnette Felts, FNP and is actively engaged with the care management team. I reached out to Carlton Adam by phone today to assist with re-scheduling a follow up visit with the Pharmacist  Follow up plan: Telephone appointment with care management team member scheduled for: 03/09/2020 @ 1300  [facility] Guide, Embedded Care Coordination [facility]  Care Management

## 2020-02-25 NOTE — Chronic Care Management (AMB) (Signed)
  Care Management   Note  02/25/2020 Name: Regla Fitzgibbon MRN: [id number] DOB: 1955/11/11  Keesha Pellum is a 64 y.o. year old female who is a primary care patient of Arnette Felts, FNP and is actively engaged with the care management team. I reached out to Carlton Adam by phone today to assist with re-scheduling a follow up visit with the Pharmacist.  Follow up plan: Unsuccessful telephone outreach attempt made. A HIPPA compliant phone message was left for the patient providing contact information and requesting a return call.  The care management team will reach out to the patient again over the next 7 days.  If patient returns call to provider office, please advise to call Embedded Care Management Care Guide Gwenevere Ghazi at [phone].  Gwenevere Ghazi  Care Guide, Embedded Care Coordination [facility] Management

## 2020-02-26 LAB — HEMOGLOBIN A1C
Est. average glucose Bld gHb Est-mCnc: 169 mg/dL
Hgb A1c MFr Bld: 7.5 % — ABNORMAL HIGH (ref 4.8–5.6)

## 2020-03-02 ENCOUNTER — Ambulatory Visit (INDEPENDENT_AMBULATORY_CARE_PROVIDER_SITE_OTHER): Payer: HMO | Admitting: Neurology

## 2020-03-02 ENCOUNTER — Other Ambulatory Visit: Payer: Self-pay

## 2020-03-02 ENCOUNTER — Encounter: Payer: Self-pay | Admitting: Nurse Practitioner

## 2020-03-02 DIAGNOSIS — H3581 Retinal edema: Secondary | ICD-10-CM

## 2020-03-02 DIAGNOSIS — G4726 Circadian rhythm sleep disorder, shift work type: Secondary | ICD-10-CM

## 2020-03-02 DIAGNOSIS — G4719 Other hypersomnia: Secondary | ICD-10-CM

## 2020-03-02 DIAGNOSIS — E663 Overweight: Secondary | ICD-10-CM

## 2020-03-02 DIAGNOSIS — R0683 Snoring: Secondary | ICD-10-CM

## 2020-03-02 DIAGNOSIS — G4733 Obstructive sleep apnea (adult) (pediatric): Secondary | ICD-10-CM

## 2020-03-03 ENCOUNTER — Other Ambulatory Visit: Payer: Self-pay

## 2020-03-07 NOTE — Procedures (Signed)
Sleep Study Report   Patient Information     First Name: Shannon Last Name: [name] ID: [id number]  Birth Date: Feb 12, 2056 Age: 64 Gender: Female  Referring Provider: Arnette Felts, FNP BMI: 27.3 (W=154 lb, H=5' 3'')  Neck Circ.:  14 '' Epworth:  15/24   Sleep Study Information    Study Date: 03/02/20 S/H/A Version: [id number] / [id number] / 34  History:    64 year old woman with a history of hypertension, hyperlipidemia, diabetes, diabetic retinopathy and overweight state, who reports snoring and excessive daytime somnolence.  She works third shift. She was advised to seek evaluation for sleep apnea by her retina specialist who was concerned that she may have underlying sleep apnea contributing to her macular edema. Summary & Diagnosis:    Mild OSA (obstructive sleep apnea)  Recommendations:     This home sleep test demonstrates overall mild obstructive sleep apnea with a total AHI of 7.7/hour and O2 nadir of 89%. Given the patient's medical history and concern for changes in her retinal exam, treatment with positive airway pressure is recommended. This can be achieved in the form of autoPAP trial/titration at home. A full night CPAP titration study can help with proper treatment settings and mask fitting if needed down the road. Alternative treatments include weight loss along with avoidance of the supine sleep position, or an oral appliance in appropriate candidates.   Please note that untreated obstructive sleep apnea may carry additional perioperative morbidity. Patients with significant obstructive sleep apnea should receive perioperative PAP therapy and the surgeons and particularly the anesthesiologist should be informed of the diagnosis and the severity of the sleep disordered breathing. The patient should be cautioned not to drive, work at heights, or operate dangerous or heavy equipment when tired or sleepy. Review and reiteration of good sleep hygiene measures should be pursued with any  patient. Other causes of the patient's symptoms, including circadian rhythm disturbances, an underlying mood disorder, medication effect and/or an underlying medical problem cannot be ruled out based on this test. Clinical correlation is recommended.   The patient and her referring provider will be notified of the test results. The patient will be seen in follow up in sleep clinic at [facility].  I certify that I have reviewed the raw data recording prior to the issuance of this report in accordance with the standards of the American Academy of Sleep Medicine (AASM).  Huston Foley, MD, PhD [facility]) Diplomat, ABPN (Neurology and Sleep)         Sleep Summary  Oxygen Saturation Statistics   Start Study Time: End Study Time: Total Recording Time:          10:29:09 PM 8:05:12 AM   9 h, 36 min  Total Sleep Time % REM of Sleep Time:  8 h, 25 min  18.7    Mean: 94 Minimum: 89 Maximum: 99  Mean of Desaturations Nadirs (%):   91  Oxygen Desaturation. %: 4-9 10-20 >20 Total  Events Number Total  29 100.0  0 0.0  0 0.0  29 100.0  Oxygen Saturation: <90 <=88 <85 <80 <70  Duration (minutes): Sleep % 0.6 0.0 0.1 0.0 0.0 0.0 0.0 0.0 0.0 0.0     Respiratory Indices      Total Events REM NREM All Night  pRDI: pAHI 3%: ODI 4%: pAHIc 3%: % CSR: pAHI 4%:  67  65  29  0 0.0 29 21.0 21.0 10.8 0.0 5.0 4.7 1.8 0.0 8.0 7.7 3.5  0.0 3.5       Pulse Rate Statistics during Sleep (BPM)      Mean: 82 Minimum: 70 Maximum: 102    Indices are calculated using technically valid sleep time of  8 hrs, 23 min.         5              15                    30             pAHI=7.7                                                         Mild              Moderate                    Severe                Body Position Statistics  Position Supine Prone Right Left Non-Supine  Sleep (min) 111.0 76.0 8.0 3.0 87.0  Sleep % 22.0 15.0 1.6 0.6 17.2    pRDI 8.7 1.6 N/A N/A 4.2  pAHI 3% 8.7 1.6 N/A N/A 4.2  ODI 4% 3.8 0.0 N/A N/A 0.7               Left   Prone  Right  Supine    Snoring Statistics Snoring Level (dB) >40 >50 >60 >70 >80 >Threshold (45)  Sleep (min) 42.9 1.4 0.3 0.0 0.0 2.3  Sleep % 8.5 0.3 0.1 0.0 0.0 0.5    Mean: 40 dB Sleep Stages Chart                       Wake  Sleep      Wake  12.24  %    Sleep  87.76  %   Total:  100.00  %                                                       REM  Light  Deep      REM  18.69  %    Light  64.79  %    Deep  16.52  %   Total:  100.00  %                                 Sleep/Wake States  Sleep Stages  Sleep Latency (min):  REM Latency (min):  Number of Wakes:   38   71   4

## 2020-03-07 NOTE — Addendum Note (Signed)
Addended by: Huston Foley on: 03/07/2020 01:55 PM   Modules accepted: Orders

## 2020-03-07 NOTE — Progress Notes (Signed)
Patient referred by Arnette Felts, NP, seen by me on 01/06/20, HST on 03/02/20.    Please call and notify the patient that the recent home sleep test showed obstructive sleep apnea. OSA is overall mild, but worth treating to see if she feels better after treatment. Her ophthalmologist had encouraged her to be checked of OSA, due to changes in her retina (macular edema). To that end, I recommend treatment in the form of autoPAP, which means, that we don't have to bring her in for a sleep study with CPAP, but will let her try an autoPAP machine at home, through a DME company (of her choice, or as per insurance requirement). The DME representative will educate her on how to use the machine, how to put the mask on, etc. I have placed an order in the chart. Please send referral, talk to patient, send report to referring MD. We will need a FU in sleep clinic for 10 weeks post-PAP set up, please arrange that with me or one of our NPs. Thanks,   Huston Foley, MD, PhD [facility])

## 2020-03-08 ENCOUNTER — Telehealth: Payer: Self-pay

## 2020-03-08 NOTE — Telephone Encounter (Signed)
-----   Message from Huston Foley, MD sent at 03/07/2020  1:55 PM EDT ----- Patient referred by Arnette Felts, NP, seen by me on 01/06/20, HST on 03/02/20.    Please call and notify the patient that the recent home sleep test showed obstructive sleep apnea. OSA is overall mild, but worth treating to see if she feels better after treatment. Her ophthalmologist had encouraged her to be checked of OSA, due to changes in her retina (macular edema). To that end, I recommend treatment in the form of autoPAP, which means, that we don't have to bring her in for a sleep study with CPAP, but will let her try an autoPAP machine at home, through a DME company (of her choice, or as per insurance requirement). The DME representative will educate her on how to use the machine, how to put the mask on, etc. I have placed an order in the chart. Please send referral, talk to patient, send report to referring MD. We will need a FU in sleep clinic for 10 weeks post-PAP set up, please arrange that with me or one of our NPs. Thanks,   Huston Foley, MD, PhD [facility])

## 2020-03-08 NOTE — Telephone Encounter (Signed)
I called pt. I advised pt that Dr. Frances Furbish reviewed their sleep study results and found that pt has mild osa. Dr. Frances Furbish recommends that pt start an autopap at treatment at home. I reviewed PAP compliance expectations with the pt. Pt is agreeable to starting an auto-PAP. I advised pt that an order will be sent to a DME, [facility], and [facility] will call the pt within about one week after they file with the pt's insurance. [facility] will show the pt how to use the machine, fit for masks, and troubleshoot the auto-PAP if needed. A follow up appt was made for insurance purposes with Dr. Frances Furbish on 05/12/2020 at 2 pm. Pt verbalized understanding to arrive 15 minutes early and bring their auto-PAP. A letter with all of this information in it will be mailed to the pt as a reminder. I verified with the pt that the address we have on file is correct. Pt verbalized understanding of results. Pt had no questions at this time but was encouraged to call back if questions arise. I have sent the order to [employer] and have received confirmation that they have received the order.

## 2020-03-09 ENCOUNTER — Other Ambulatory Visit: Payer: Self-pay

## 2020-03-09 ENCOUNTER — Telehealth: Payer: Self-pay

## 2020-03-09 ENCOUNTER — Ambulatory Visit: Payer: Self-pay

## 2020-03-09 DIAGNOSIS — E119 Type 2 diabetes mellitus without complications: Secondary | ICD-10-CM

## 2020-03-09 DIAGNOSIS — Z794 Long term (current) use of insulin: Secondary | ICD-10-CM

## 2020-03-09 DIAGNOSIS — I10 Essential (primary) hypertension: Secondary | ICD-10-CM

## 2020-03-09 MED ORDER — GLUCOSE BLOOD VI STRP: ORAL_STRIP | 12 refills | Status: DC

## 2020-03-09 MED ORDER — BLOOD GLUCOSE MONITOR KIT: PACK | 9 refills | Status: DC

## 2020-03-09 NOTE — Chronic Care Management (AMB) (Signed)
Chronic Care Management Pharmacy  Name: Justin Buechner  MRN: [id number] DOB: 1956/01/06  Chief Complaint/ HPI  Received incoming call from Oneal Deputy,  64 y.o. , female regarding blood glucose testing supplies. Pt reports she is going out of town on Friday and needs testing supplies.   PCP : [name], FNP  Their chronic conditions include: HTN, DM, HLD.   Office Visits: 02/16/20 OV: Note unavailable  01/28/20: Metformin changed to XR 742m once daily after PharmD recommendation  12/15/2019: Referral for sleep study.   11/16/2019 - AWV and f/u Diabetes visit - recommend Vitamin D, Calcium and Multivitamin. Referral to PharmD due to cost of Ozempic. Ordered Lipid panel, CMP14+EGFR, A1c, CBC, TSH.   08/11/2019 - recommended annual diabetic eye exam. Patient needs new glucometer for insurance. Recheck A1c. Ordered POCT urinalysis, CMP14+EGFR, Lipid panel. Ordered DEXA scan. Prevnar 13 rx sent to pharmacy.   Consult Visits: 02/09/20 Ophthalmology w/ Dr. RZadie Rhine   01/06/20 Neurology OV w/ Dr. ARexene Alberts Presented for sleep consult.   12/29/2019 - retreating right eye with Avastin. Left eye appears resolved and will continue to observe.   12/08/2019 - [facility]- improved macular edema with Avastin therapy.  [facility]17/2019 [facility] - Slight improvement on intravitreal Avastin, will repeat. Patient has symptoms positive for sleep apnea. Reports interested in being tested for sleep apnea.   10/27/2019 - [facility]- stable but not improved macular edema at 2 week follow-up after Avastin.    09/29/2019 - [facility] - Vitreal Avastin OD performed. 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   CC[facility]its: [facility] Patient reports A1c is elevated due to conserving medicine prior to getting  Medicare 05/26/2019. Blood sugars range from 147-257.   Medications: Outpatient Encounter Medications as of 03/09/2020  Medication Sig  . atorvastatin (LIPITOR) 20 MG tablet Take 1 tablet (20 mg total) by mouth daily. Hold for next 2 weeks  . Blood Glucose Monitoring Suppl (ONE TOUCH ULTRA MINI) w/Device KIT 1 each by Does not apply route 4 (four) times daily -  before meals and at bedtime.  . Cholecalciferol (VITAMIN D-3 PO) Take 1 capsule by mouth daily.  .Marland Kitchenglucose blood test strip Use as instructed  . insulin degludec (TRESIBA FLEXTOUCH) 100 UNIT/ML FlexTouch Pen Inject 0.21 mLs (21 Units total) into the skin daily.  . Lancets (ONETOUCH ULTRASOFT) lancets Use as instructed to check blood sugars 4 times daily E11.9  . lisinopril (ZESTRIL) 10 MG tablet Take 1 tablet (10 mg total) by mouth daily.  . metFORMIN (GLUCOPHAGE XR) 750 MG 24 hr tablet Take 1 tablet (750 mg total) by mouth daily with breakfast.  . Semaglutide,0.25 or 0.5MG/DOS, (OZEMPIC, 0.25 OR 0.5 MG/DOSE,) 2 MG/1.5ML SOPN Inject 0.5 mg into the skin once a week.  . [DISCONTINUED] glucose blood (ONETOUCH VERIO) test strip Use as instructed   No facility-administered encounter medications on file as of 03/09/2020.   No Known Allergies  Current Diagnosis/Assessment:   Goals Addressed            This Visit's Progress   . Pharmacy Care Plan       CARE PLAN ENTRY (see longitudinal plan of care for additional care plan information)  Current Barriers:  . Chronic Disease Management support, education, and care coordination needs related to Hypertension, Hyperlipidemia, Diabetes, and Osteopenia  Hypertension BP Readings from Last 3 Encounters:  01/06/20 112/60  12/15/19 130/72  11/16/19 130/86   . Pharmacist Clinical Goal(s): o Over the next 180 days, patient will work with PharmD and providers to maintain BP goal <130/80 . Current regimen:  o Lisinopril 64m daily . Interventions: o Provided dietary and exercise  recommendations . Patient self care activities - Over the next 180 days, patient will: o Check BP if symptomatic, document, and provide at future appointments o Ensure daily salt intake < 2300 mg/day  Hyperlipidemia Lab Results  Component Value Date/Time   LDLCALC 102 (H) 11/16/2019 03:50 PM   . Pharmacist Clinical Goal(s): o Over the next 90 days, patient will work with PharmD and providers to achieve LDL goal < 70 . Current regimen:  o Atorvastatin 260mdaily . Interventions: o Provided dietary and exercise recommendations o Discussed appropriate goals for LDL, HDL and triglycerides . Patient self care activities - Over the next 90 days, patient will: o Exercise for at least 30 minutes daily 5 times per week o Limits fried foods and fatty foods o Use PLATE method for planning a well-balanced diet  Diabetes Lab Results  Component Value Date/Time   HGBA1C 7.4 (H) 11/16/2019 03:50 PM   HGBA1C 9.4 (H) 08/11/2019 09:16 AM   . Pharmacist Clinical Goal(s): o Over the next 90 days, patient will work with PharmD and providers to achieve A1c goal <7% . Current regimen:   Metformin XR 75051mnce daily  Ozempic 0.5 mg into the skin once a week (Fridays)  Tresiba 100 units/mL 21 units into the skin daily . Interventions: o Provided dietary and exercise recommendations o Recommend patient drink 64 ounces of water daily o Determined patient has Accu Check Guide Meter but OneTouch Verio test strips o Coordinated with PCP staff new glucometer sent to pharmacy for delivery prior to patient leaving on vacation o Will have prescription sent to [facility] for FreeStyle LibCobrepplies due [facility] activities - Over the next 90 days, patient will: o Check blood sugar once daily, document, and provide at future appointments o Contact provider with any episodes of hypoglycemia o Exercise for at least 30 minutes daily 5 times per  week o Start metformin XR 750m30mce daily after completing current fill of metformin 500mg3mteopenia . Pharmacist Clinical Goal(s) o Over the next 180 days, patient will work with PharmD and providers to take appropriate supplementation to strengthen bones . Current regimen:  o  N/A . Interventions: o Provided patient education about osteopenia and recommended supplements  o Encouraged weight bearing exercises 3 days per week . Patient self care activities - Over the next 90 days, patient will: o Start taking calcium 1200mg 74my o Start taking Cholecalciferol 3000 units daily o Start weight-bearing exercises 3 days per week  Medication management . Pharmacist Clinical Goal(s): o Over the next 180 days, patient will work with PharmD and providers to achieve optimal medication adherence . Current pharmacy: [facility] . Interventions o Comprehensive medication review performed. o Verified last fill dates and when patient will need mediations filled again o Utilize [facility] pharmacy for medication synchronization, packaging and delivery . Patient self care activities - Over the next 180 days, patient will: o Focus on medication adherence by utilization adherence packaging and medication synchronization o Take medications as prescribed o Report any questions or concerns to PharmD and/or provider(s)  Please see past updates related to this goal by clicking on  the "Past Updates" button in the selected goal        Hyperlipidemia   LDL goal < 70  Lipid Panel     Component Value Date/Time   CHOL 192 02/16/2020 1040   TRIG 202 (H) 02/16/2020 1040   HDL 55 02/16/2020 1040   LDLCALC 102 (H) 02/16/2020 1040    Hepatic Function Latest Ref Rng & Units 02/16/2020 11/16/2019 08/11/2019  Total Protein 6.0 - 8.5 g/dL 7.2 7.1 7.2  Albumin 3.8 - 4.8 g/dL 4.9(H) 4.7 4.6  AST 0 - 40 IU/L 18 13 12   ALT 0 - 32 IU/L 21 17 18   Alk Phosphatase 48 - 121 IU/L 98 91 88  Total Bilirubin 0.0 - 1.2  mg/dL 0.3 0.5 0.5    The 10-year ASCVD risk score Mikey Bussing DC Jr., et al., 2013) is: 20.5%   Values used to calculate the score:     Age: 70 years     Sex: Female     Is Non-Hispanic African American: Yes     Diabetic: Yes     Tobacco smoker: No     Systolic Blood Pressure: 681 mmHg     Is BP treated: Yes     HDL Cholesterol: 55 mg/dL     Total Cholesterol: 192 mg/dL   Patient has failed these meds in past: N/A Patient is currently uncontrolled on the following medications:  . Atorvastatin 20 mg daily  Plan Continue current medications  Diabetes   Recent Relevant Labs: Lab Results  Component Value Date/Time   HGBA1C 7.5 (H) 02/25/2020 04:32 PM   HGBA1C 7.4 (H) 11/16/2019 03:50 PM   MICROALBUR 80 08/11/2019 10:29 AM    Checking BG: Daily  Recent FBG Readings: Never above 145, 123 Recent pre-meal BG readings:  Recent 2hr PP BG readings:   Recent HS BG readings:  Patient has failed these meds in past: Victoza, Actos Patient is currently uncontrolled on the following medications:   Metformin XR 74m once daily  Ozempic 0.5 mg into the skin once a week (Fridays)  TTyler Aas100 units/mL 21 units into the skin daily  Last diabetic Foot exam: 06/01/19 Last diabetic Eye exam:  Lab Results  Component Value Date/Time   HMDIABEYEEXA Retinopathy (A) 09/14/2019 12:00 AM    We discussed:  . Pt is going out of town and has not received BG testing supplies from [facility]. Determined that pt has not called [facility] regarding [facility] supplies o [facility] left messages trying to reach pt regarding delivery of FreeStyle Libre supplies o Provided pt with contact number for [facility] ([phone] and advised her to call . Pt states that she has Accu Check Guide Meter and OneTouch Verio test strips at home . Determined that [employer] insurance prefers/covers One[employer] supplies  Plan Continue current medications  Coordinated with PCP to send in OneTouch meter to go with the strips pt currently  has Verified that [facility] pharmacy can deliver meter on Thursday, before pt leaves for her trip Will have prescription for FreeStyle [place]lies sent to [facility] to see if [employer] will cover at local pharmacy  Hypertension   Office blood pressures are  BP Readings from Last 3 Encounters:  02/16/20 130/70  01/06/20 112/60  12/15/19 130/72   Patient has failed these meds in the past: N/A Patient is currently controlled on the following medications:   Lisinopril 10 mg daily  Patient checks BP at home infrequently  Patient home BP readings are ranging: None to provide  Plan Continue  current medications   Osteopenia / Osteoporosis   Last DEXA Scan: 12/09/2019   T-Score femoral neck: -1.7  T-Score forearm radius: -0.3  10-year probability of major osteoporotic fracture: 4.3%  10-year probability of hip fracture: 0.6%  No results found for: VD25OH   Patient is not a candidate for pharmacologic treatment  Patient has failed these meds in past: N/A Patient is currently uncontrolled on the following medications: N/A  We discussed:  Recommend 1200 mg of calcium daily from dietary and supplemental sources. Recommend weight-bearing and muscle strengthening exercises for building and maintaining bone density.  Pt has cholecalciferol 3000 units and states she will start taking daily  Plan Continue current medications  Recommend Vitamin D at next PCP office visit  Health Maintenance   Patient is currently on the following medications:   Tylenol 500 mg-1000 mg q6h prn pain . Vitamin C 1000 mg daily  . Fish Oil daily  Plan Continue current medications  Vaccines   Reviewed and discussed patient's vaccination history.    Immunization History  Administered Date(s) Administered  . Influenza,inj,Quad PF,6+ Mos 04/18/2018  . Influenza-Unspecified 05/01/2019  . PFIZER SARS-COV-2 Vaccination 09/18/2019, 10/14/2019  . Pneumococcal Polysaccharide-23 05/30/2018  . Tdap  11/28/2017   Plan Discuss at follow up  Medication Management   Pt uses [facility] for all medications Uses pill box? Yes Pt endorses 84% compliance  Plan Utilize [facility] for medication synchronization, packaging and delivery  Verbal consent obtained for [facility] enhanced pharmacy services (medication synchronization, adherence packaging, delivery coordination). A medication sync plan was created to allow patient to get all medications delivered once every 30 to 90 days per patient preference. Patient understands they have freedom to choose pharmacy and clinical pharmacist will coordinate care between all prescribers and [facility].   Follow up: 2 month phone visit  [name], PharmD Clinical Pharmacist [facility] [phone]

## 2020-03-16 ENCOUNTER — Telehealth: Payer: Self-pay

## 2020-03-16 ENCOUNTER — Encounter (INDEPENDENT_AMBULATORY_CARE_PROVIDER_SITE_OTHER): Payer: Self-pay | Admitting: Ophthalmology

## 2020-03-16 ENCOUNTER — Other Ambulatory Visit: Payer: Self-pay

## 2020-03-16 ENCOUNTER — Ambulatory Visit (INDEPENDENT_AMBULATORY_CARE_PROVIDER_SITE_OTHER): Payer: HMO | Admitting: Ophthalmology

## 2020-03-16 DIAGNOSIS — E113311 Type 2 diabetes mellitus with moderate nonproliferative diabetic retinopathy with macular edema, right eye: Secondary | ICD-10-CM | POA: Diagnosis not present

## 2020-03-16 DIAGNOSIS — G4730 Sleep apnea, unspecified: Secondary | ICD-10-CM

## 2020-03-16 MED ORDER — BEVACIZUMAB CHEMO INJECTION 1.25MG/0.05ML SYRINGE FOR KALEIDOSCOPE
1.2500 mg | INTRAVITREAL | Status: AC | PRN
  Administered 2020-03-16: 1.25 mg via INTRAVITREAL

## 2020-03-16 NOTE — Assessment & Plan Note (Signed)
CSME OD has improved now 5 week status post most recent intravitreal Avastin.  We will repeat injection today at 5-week interval and examination repeated again in 6 weeks

## 2020-03-16 NOTE — Progress Notes (Signed)
03/16/2020     CHIEF COMPLAINT Patient presents for Retina Follow Up   HISTORY OF PRESENT ILLNESS: Shannon [name] is a 64 y.o. female who presents to the clinic today for:   HPI    Retina Follow Up    Patient presents with  Diabetic Retinopathy.  In right eye.  Severity is moderate.  Duration of 5 weeks.  Since onset it is stable.  I, the attending physician,  performed the HPI with the patient and updated documentation appropriately.          Comments    5 Week NPDR f\u OD. Possible Avastin OD. OCT  Pt states no changes in vision. Denies any complaints. BGL: 145       Last edited by Tilda Franco on 03/16/2020  9:54 AM. (History)      Referring physician: [name], [address],  [place] [address]  HISTORICAL INFORMATION:   Selected notes from the MEDICAL RECORD NUMBER    Lab Results  Component Value Date   HGBA1C 7.5 (H) 02/25/2020     CURRENT MEDICATIONS: No current outpatient medications on file. (Ophthalmic Drugs)   No current facility-administered medications for this visit. (Ophthalmic Drugs)   Current Outpatient Medications (Other)  Medication Sig  . atorvastatin (LIPITOR) 20 MG tablet Take 1 tablet (20 mg total) by mouth daily. Hold for next 2 weeks  . blood glucose meter kit and supplies KIT Dispense based on patient and insurance preference. Use 3 times daily as directed to check blood sugar. Dx code e11.65  . Blood Glucose Monitoring Suppl (ONE TOUCH ULTRA MINI) w/Device KIT 1 each by Does not apply route 4 (four) times daily -  before meals and at bedtime.  . Cholecalciferol (VITAMIN D-3 PO) Take 1 capsule by mouth daily.  Marland Kitchen glucose blood test strip Use as instructed  . glucose blood test strip Use as instructed to check blood sugar 3 times a day. Dx code e11.65  . insulin degludec (TRESIBA FLEXTOUCH) 100 UNIT/ML FlexTouch Pen Inject 0.21 mLs (21 Units total) into the skin daily.  . Lancets (ONETOUCH  ULTRASOFT) lancets Use as instructed to check blood sugars 4 times daily E11.9  . lisinopril (ZESTRIL) 10 MG tablet Take 1 tablet (10 mg total) by mouth daily.  . metFORMIN (GLUCOPHAGE XR) 750 MG 24 hr tablet Take 1 tablet (750 mg total) by mouth daily with breakfast.  . Semaglutide,0.25 or 0.5MG/DOS, (OZEMPIC, 0.25 OR 0.5 MG/DOSE,) 2 MG/1.5ML SOPN Inject 0.5 mg into the skin once a week.   No current facility-administered medications for this visit. (Other)      REVIEW OF SYSTEMS: ROS    Positive for: Endocrine   Last edited by Tilda Franco on 03/16/2020  9:54 AM. (History)       ALLERGIES No Known Allergies  PAST MEDICAL HISTORY Past Medical History:  Diagnosis Date  . Diabetes mellitus without complication ([place])   . Hypercholesteremia   . Hypertension    Past Surgical History:  Procedure Laterality Date  . ABDOMINAL HYSTERECTOMY    . CATARACT EXTRACTION Bilateral   . CESAREAN SECTION    . MYOMECTOMY      FAMILY HISTORY Family History  Problem Relation Age of Onset  . Hypertension Mother   . Diabetes Mother   . Stroke Mother   . Diabetes Father   . Hypertension Father   . Stroke Father   . Hypertension Son   . Diabetes Son   .  Breast cancer Neg Hx     SOCIAL HISTORY Social History   Tobacco Use  . Smoking status: Former Smoker    Quit date: 2006    Years since quitting: 15.7  . Smokeless tobacco: Never Used  . Tobacco comment: quit 10 yrs  Vaping Use  . Vaping Use: Never used  Substance Use Topics  . Alcohol use: Yes    Comment: 1 glass of wine sometimes daily   . Drug use: Never         OPHTHALMIC EXAM: Base Eye Exam    Visual Acuity (Snellen - Linear)      Right Left   Dist [place] 20/20 20/20 -1       Tonometry (Tonopen, 9:58 AM)      Right Left   Pressure 14 13       Pupils      Pupils Dark Light Shape React APD   Right PERRL 3 2 Round Brisk None   Left PERRL 3 2 Round Brisk None       Visual Fields (Counting fingers)       Left Right    Full Full       Neuro/Psych    Oriented x3: Yes   Mood/Affect: Normal       Dilation    Right eye: 1.0% Mydriacyl, 2.5% Phenylephrine @ 9:58 AM        Slit Lamp and Fundus Exam    External Exam      Right Left   External Normal Normal       Slit Lamp Exam      Right Left   Lids/Lashes Normal Normal   Conjunctiva/Sclera White and quiet White and quiet   Cornea Clear Clear   Anterior Chamber Deep and quiet Deep and quiet   Iris Round and reactive Round and reactive   Lens Posterior chamber intraocular lens Posterior chamber intraocular lens   Anterior Vitreous Normal Normal       Fundus Exam      Right Left   Posterior Vitreous Normal    Disc Normal    C/D Ratio 0.2    Macula Clinically significant macular edema, Exudates, Macular thickening, Mild clinically significant macular edema    Vessels NPDR- Moderate    Periphery Normal           IMAGING AND PROCEDURES  Imaging and Procedures for 03/16/20  OCT, Retina - OU - Both Eyes       Right Eye Quality was good. Scan locations included subfoveal. Central Foveal Thickness: 280. Progression has improved.   Left Eye Quality was good. Scan locations included subfoveal. Central Foveal Thickness: 271. Progression has been stable.   Notes CSME OD has improved now 5 week status post most recent intravitreal Avastin.  We will repeat injection today at 5-week interval and examination repeated again in 6 weeks       Intravitreal Injection, Pharmacologic Agent - OD - Right Eye       Time Out 03/16/2020. 10:49 AM. Confirmed correct patient, procedure, site, and patient consented.   Anesthesia Topical anesthesia was used. Anesthetic medications included [place] 3.5%.   Procedure Preparation included Ofloxacin , 10% betadine to eyelids, 5% betadine to ocular surface. A supplied needle was used.   Injection:  1.25 mg Bevacizumab (AVASTIN) SOLN   NDC: [address]-2, Lot: [id number]   Route: Intravitreal,  Site: Right Eye, Waste: 0 mg  Post-op Post injection exam found visual acuity of at least counting fingers. The patient  tolerated the procedure well. There were no complications. The patient received written and verbal post procedure care education. Post injection medications were not given.                 ASSESSMENT/PLAN:  Moderate nonproliferative diabetic retinopathy of right eye with macular edema (HCC) CSME OD has improved now 5 week status post most recent intravitreal Avastin.  We will repeat injection today at 5-week interval and examination repeated again in 6 weeks      ICD-10-CM   1. Moderate nonproliferative diabetic retinopathy of right eye with macular edema associated with type 2 diabetes mellitus (HCC)  E11.3311 OCT, Retina - OU - Both Eyes    Intravitreal Injection, Pharmacologic Agent - OD - Right Eye    Bevacizumab (AVASTIN) SOLN 1.25 mg  2. Sleep apnea in adult  G47.30     1.  2.  3.  Ophthalmic Meds Ordered this visit:  Meds ordered this encounter  Medications  . Bevacizumab (AVASTIN) SOLN 1.25 mg       Return in about 7 weeks (around 05/04/2020) for dilate, OD, AVASTIN OCT.  There are no Patient Instructions on file for this visit.   Explained the diagnoses, plan, and follow up with the patient and they expressed understanding.  Patient expressed understanding of the importance of proper follow up care.   Clent Demark Iasha Mccalister M.D. Diseases & Surgery of the Retina and Vitreous [facility] [place] 03/16/20     Abbreviations: M myopia (nearsighted); A astigmatism; H hyperopia (farsighted); P presbyopia; Mrx spectacle prescription;  CTL contact lenses; OD right eye; OS left eye; OU both eyes  XT exotropia; ET esotropia; PEK punctate epithelial keratitis; PEE punctate epithelial erosions; DES dry eye syndrome; MGD meibomian gland dysfunction; ATs artificial tears; PFAT's preservative free artificial tears; [place] nuclear sclerotic cataract;  PSC posterior subcapsular cataract; ERM epi-retinal membrane; PVD posterior vitreous detachment; RD retinal detachment; DM diabetes mellitus; DR diabetic retinopathy; NPDR non-proliferative diabetic retinopathy; PDR proliferative diabetic retinopathy; CSME clinically significant macular edema; DME diabetic macular edema; dbh dot blot hemorrhages; CWS cotton wool spot; POAG primary open angle glaucoma; C/D cup-to-disc ratio; HVF humphrey visual field; GVF goldmann visual field; OCT optical coherence tomography; IOP intraocular pressure; BRVO Branch retinal vein occlusion; CRVO central retinal vein occlusion; CRAO central retinal artery occlusion; BRAO branch retinal artery occlusion; RT retinal tear; SB scleral buckle; PPV pars plana vitrectomy; VH Vitreous hemorrhage; PRP panretinal laser photocoagulation; IVK intravitreal kenalog; VMT vitreomacular traction; MH Macular hole;  NVD neovascularization of the disc; NVE neovascularization elsewhere; AREDS age related eye disease study; ARMD age related macular degeneration; POAG primary open angle glaucoma; EBMD epithelial/anterior basement membrane dystrophy; ACIOL anterior chamber intraocular lens; IOL intraocular lens; PCIOL posterior chamber intraocular lens; Phaco/IOL phacoemulsification with intraocular lens placement; [place] photorefractive keratectomy; LASIK laser assisted in situ keratomileusis; HTN hypertension; DM diabetes mellitus; COPD chronic obstructive pulmonary disease

## 2020-03-16 NOTE — Assessment & Plan Note (Signed)
Recent testing disclosed patient has total AHI of 7.7 episodes per hour, this is mild to moderate sleep apnea likely obstructive.  Patient is being fitted soon for an appliance to assist in her sleep

## 2020-03-16 NOTE — Chronic Care Management (AMB) (Signed)
Chronic Care Management Pharmacy Assistant   Name: Shannon [name]  MRN: [id number] DOB: 11/03/1955  Reason for Encounter: Medication Review/ Medication Dispensing Call  Called patient 03/16/20 and 03/17/20 regarding medication dispensing, no return call. Patient has an appointment with [name], CPP on 03/22/20, pharmacist will complete medication process.   PCP : [name], FNP  Allergies:  No Known Allergies  Medications: Outpatient Encounter Medications as of 03/16/2020  Medication Sig  . atorvastatin (LIPITOR) 20 MG tablet Take 1 tablet (20 mg total) by mouth daily. Hold for next 2 weeks  . blood glucose meter kit and supplies KIT Dispense based on patient and insurance preference. Use 3 times daily as directed to check blood sugar. Dx code e11.65  . Blood Glucose Monitoring Suppl (ONE TOUCH ULTRA MINI) w/Device KIT 1 each by Does not apply route 4 (four) times daily -  before meals and at bedtime.  . Cholecalciferol (VITAMIN D-3 PO) Take 1 capsule by mouth daily.  Marland Kitchen glucose blood test strip Use as instructed  . glucose blood test strip Use as instructed to check blood sugar 3 times a day. Dx code e11.65  . insulin degludec (TRESIBA FLEXTOUCH) 100 UNIT/ML FlexTouch Pen Inject 0.21 mLs (21 Units total) into the skin daily.  . Lancets (ONETOUCH ULTRASOFT) lancets Use as instructed to check blood sugars 4 times daily E11.9  . lisinopril (ZESTRIL) 10 MG tablet Take 1 tablet (10 mg total) by mouth daily.  . metFORMIN (GLUCOPHAGE XR) 750 MG 24 hr tablet Take 1 tablet (750 mg total) by mouth daily with breakfast.  . Semaglutide,0.25 or 0.5MG/DOS, (OZEMPIC, 0.25 OR 0.5 MG/DOSE,) 2 MG/1.5ML SOPN Inject 0.5 mg into the skin once a week.   No facility-administered encounter medications on file as of 03/16/2020.    Current Diagnosis: Patient Active Problem List   Diagnosis Date Noted  . Vitreous floaters of left eye 12/29/2019  . Other long term (current) drug therapy  11/16/2019  . Snores 10/27/2019  . Moderate nonproliferative diabetic retinopathy of right eye with macular edema ([place]) 10/06/2019  . Moderate nonproliferative diabetic retinopathy of left eye with macular edema associated with type 2 diabetes mellitus ([place]) 10/06/2019  . Retinal exudates and deposits 10/06/2019  . Cough 11/20/2018  . Other fatigue 11/20/2018  . COVID-19 virus infection 10/13/2018  . Hypokalemia 10/13/2018  . Pyelonephritis 06/17/2018    Goals Addressed   None    Reviewed chart for medication changes ahead of medication coordination call.  Patient had an OV today 03/16/20- Dr Zadie Rhine (Ophthalmology) since last Pharmacist visit with [name], CPP on 03/09/2020.  No medication changes indicated.  BP Readings from Last 3 Encounters:  02/16/20 130/70  01/06/20 112/60  12/15/19 130/72    Lab Results  Component Value Date   HGBA1C 7.5 (H) 02/25/2020     Patient will obtain medications through Adherence Packaging  90 Days   Last adherence delivery included: None- New to [facility] as of 02/01/20. Patient recently received Onetouch Meter through [facility] on 03/09/20.   Patient is due for adherence delivery on: 03/22/20.  Called patient and reviewed medications and coordinated delivery.  This delivery to include: Metformin 750 mg 1 tablet daily (breakfast), Atorvastatin 20 mg 1 tablet daily(bedtime), Lisinopril 10 mg 1 tablet daily (breakfast), Tyler Aas Flextouch 21 units sq daily(breakfast), Onetouch ultra test strip and lancets to check blood sugars three times a day (before breakfast, lunch, evening meal).   Patient needs refills for:  Metformin 750  mg 1 tablet daily (breakfast), Atorvastatin 20 mg 1 tablet daily(bedtime), Lisinopril 10 mg 1 tablet daily (breakfast), Tyler Aas Flextouch 21 units sq daily(breakfast), Onetouch ultra test strip and lancets to check blood sugars three times a day (before breakfast, lunch, evening  meal).  Pharmacist will confirm delivery date at future appointment 03/22/2020. Patient will be advised that pharmacy will contact them the morning of delivery.    Follow-Up:  Coordination of Enhanced Pharmacy Services and Pharmacist Review [name], CPP aware of outreach to patient.  [name], [place] Pharmacist Assistant [phone]

## 2020-03-16 NOTE — Patient Instructions (Signed)
Patient asked to notify the office promptly if new visual acuity distortion or declines occur.  Patient also instructed to monitor her response to using a nightly breathing appliance to treat her sleep apnea.

## 2020-03-21 ENCOUNTER — Encounter: Payer: Self-pay | Admitting: Nurse Practitioner

## 2020-03-22 ENCOUNTER — Other Ambulatory Visit: Payer: Self-pay

## 2020-03-22 ENCOUNTER — Ambulatory Visit: Payer: HMO

## 2020-03-22 ENCOUNTER — Ambulatory Visit (INDEPENDENT_AMBULATORY_CARE_PROVIDER_SITE_OTHER): Payer: HMO

## 2020-03-22 VITALS — BP 132/70 | HR 70 | Temp 97.0°F | Ht 62.4 in | Wt 154.4 lb

## 2020-03-22 DIAGNOSIS — E119 Type 2 diabetes mellitus without complications: Secondary | ICD-10-CM

## 2020-03-22 DIAGNOSIS — Z23 Encounter for immunization: Secondary | ICD-10-CM

## 2020-03-22 DIAGNOSIS — Z794 Long term (current) use of insulin: Secondary | ICD-10-CM

## 2020-03-22 DIAGNOSIS — I10 Essential (primary) hypertension: Secondary | ICD-10-CM

## 2020-03-22 MED ORDER — FREESTYLE LIBRE 14 DAY READER DEVI: 1 refills | Status: DC

## 2020-03-22 MED ORDER — FREESTYLE LIBRE 14 DAY SENSOR MISC: 1 refills | Status: DC

## 2020-03-22 NOTE — Patient Instructions (Signed)
Visit Information  Goals Addressed            This Visit's Progress   . Pharmacy Care Plan       CARE PLAN ENTRY (see longitudinal plan of care for additional care plan information)  Current Barriers:  . Chronic Disease Management support, education, and care coordination needs related to Hypertension, Hyperlipidemia, Diabetes, and Osteopenia   Hypertension BP Readings from Last 3 Encounters:  01/06/20 112/60  12/15/19 130/72  11/16/19 130/86   . Pharmacist Clinical Goal(s): o Over the next 180 days, patient will work with PharmD and providers to maintain BP goal <130/80 . Current regimen:  o Lisinopril 10mg  daily . Interventions: o Provided dietary and exercise recommendations . Patient self care activities - Over the next 180 days, patient will: o Check BP if symptomatic, document, and provide at future appointments o Ensure daily salt intake < 2300 mg/day  Hyperlipidemia Lab Results  Component Value Date/Time   LDLCALC 102 (H) 11/16/2019 03:50 PM   . Pharmacist Clinical Goal(s): o Over the next 90 days, patient will work with PharmD and providers to achieve LDL goal < 70 . Current regimen:  o Atorvastatin 20mg  daily . Interventions: o Provided dietary and exercise recommendations o Discussed appropriate goals for LDL, HDL and triglycerides . Patient self care activities - Over the next 90 days, patient will: o Exercise for at least 30 minutes daily 5 times per week o Limits fried foods and fatty foods o Use PLATE method for planning a well-balanced diet  Diabetes Lab Results  Component Value Date/Time   HGBA1C 7.4 (H) 11/16/2019 03:50 PM   HGBA1C 9.4 (H) 08/11/2019 09:16 AM   . Pharmacist Clinical Goal(s): o Over the next 90 days, patient will work with PharmD and providers to achieve A1c goal <7% . Current regimen:   Metformin XR 750mg  once daily  Ozempic 0.5 mg into the skin once a week (Fridays)  Tresiba 100 units/mL 21 units into the skin  daily . Interventions: o Provided dietary and exercise recommendations o Recommend patient drink 64 ounces of water daily o Determined patient has Accu Check Guide Meter but OneTouch Verio test strips o Coordinated with PCP staff new glucometer sent to pharmacy for delivery prior to patient leaving on vacation o Will have prescription sent to [facility] for FreeStyle [place] supplies due to difficulty obtaining supplies from [facility] . Patient self care activities - Over the next 90 days, patient will: o Check blood sugar once daily, document, and provide at future appointments o Contact provider with any episodes of hypoglycemia o Exercise for at least 30 minutes daily 5 times per week o Start metformin XR 750mg  once daily after completing current fill of metformin 500mg   Osteopenia . Pharmacist Clinical Goal(s) o Over the next 180 days, patient will work with PharmD and providers to take appropriate supplementation to strengthen bones . Current regimen:  o  N/A . Interventions: o Provided patient education about osteopenia and recommended supplements  o Encouraged weight bearing exercises 3 days per week . Patient self care activities - Over the next 90 days, patient will: o Start taking calcium 1200mg  daily o Start taking Cholecalciferol 3000 units daily o Start weight-bearing exercises 3 days per week  Medication management . Pharmacist Clinical Goal(s): o Over the next 180 days, patient will work with PharmD and providers to achieve optimal medication adherence . Current pharmacy: [facility] . Interventions o Comprehensive medication review performed. o Verified last fill dates and when patient will  need mediations filled again o Utilize [facility] for medication synchronization, packaging and delivery . Patient self care activities - Over the next 180 days, patient will: o Focus on medication adherence by utilization adherence packaging and medication synchronization o Take  medications as prescribed o Report any questions or concerns to PharmD and/or provider(s)  Please see past updates related to this goal by clicking on the "Past Updates" button in the selected goal         Patient verbalizes understanding of instructions provided today.   Face to Face appointment with pharmacist scheduled for:  03/22/20 @ 2:00 PM  Beryle Flock, PharmD Clinical Pharmacist [facility] [phone]

## 2020-03-22 NOTE — Patient Instructions (Addendum)
Visit Information  Goals Addressed            This Visit's Progress   . Pharmacy Care Plan       CARE PLAN ENTRY (see longitudinal plan of care for additional care plan information)  Current Barriers:  . Chronic Disease Management support, education, and care coordination needs related to Hypertension, Hyperlipidemia, Diabetes, and Osteopenia   Hypertension BP Readings from Last 3 Encounters:  01/06/20 112/60  12/15/19 130/72  11/16/19 130/86   . Pharmacist Clinical Goal(s): o Over the next 180 days, patient will work with PharmD and providers to maintain BP goal <130/80 . Current regimen:  o Lisinopril 10mg  daily . Interventions: o Provided dietary and exercise recommendations . Patient self care activities - Over the next 180 days, patient will: o Check BP if symptomatic, document, and provide at future appointments o Ensure daily salt intake < 2300 mg/day o Continue exercising for at least 30 minutes 5 times weekly (150 minutes per week total)  Hyperlipidemia Lab Results  Component Value Date/Time   LDLCALC 102 (H) 11/16/2019 03:50 PM   . Pharmacist Clinical Goal(s): o Over the next 90 days, patient will work with PharmD and providers to achieve LDL goal < 70 . Current regimen:  o Atorvastatin 20mg  daily . Interventions: o Provided dietary and exercise recommendations o Discussed appropriate goals for LDL, HDL and triglycerides o Determined patient has not had atorvastatin filled this year - Has been taking medication she had from previous fills in 2020 . Patient self care activities - Over the next 90 days, patient will: o Exercise for at least 30 minutes daily 5 times per week o Limits fried foods and fatty foods o Use PLATE method for planning a well-balanced diet  Diabetes Lab Results  Component Value Date/Time   HGBA1C 7.4 (H) 11/16/2019 03:50 PM   HGBA1C 9.4 (H) 08/11/2019 09:16 AM   . Pharmacist Clinical Goal(s): o Over the next 90 days, patient  will work with PharmD and providers to achieve A1c goal <7% . Current regimen:   Metformin XR 750mg  once daily  Ozempic 0.5 mg into the skin once a week (Fridays)  Tresiba 100 units/mL 21 units into the skin daily . Interventions: o Provided dietary and exercise recommendations o Recommend patient drink 64 ounces of water daily o Will coordinate with [facility] to deliver OneTouch Ultra test strips and lancets to go with meter she received on 9/16 o Will have prescription sent to [facility] for FreeStyle [place] supplies due to difficulty obtaining supplies from [place] o Provided patient with sample of FreeStyle Libre 2 sensor in office and taught how to apply sensor o Assisted patient with downloading FreeStyle Libre 2 application for iPhone to check blood sugar with sensor o Discussed appropriate goal for fasting blood sugar (80-130) . Patient self care activities - Over the next 90 days, patient will: o Check blood sugar 3-4 times daily, document, and provide at future appointments o Contact provider with any episodes of hypoglycemia o Exercise for at least 30 minutes daily 5 times per week o Start metformin XR 750mg  once daily o Use FreeStyle Libre 2 sensor with iPhone application to check  blood sugar  Osteopenia . Pharmacist Clinical Goal(s) o Over the next 180 days, patient will work with PharmD and providers to take appropriate supplementation to strengthen bones . Current regimen:  o  Vitamin D3 2000 units daily . Interventions: o Provided patient education about osteopenia and recommended supplements  o Encouraged weight bearing  exercises 3 days per week . Patient self care activities - Over the next 90 days, patient will: o Continue calcium 1200mg  daily o Start taking Cholecalciferol 2000 units daily (will provide in medication packaging) o Start weight-bearing exercises 3 days per week  Medication management . Pharmacist Clinical Goal(s): o Over the next 180 days, patient  will work with PharmD and providers to achieve optimal medication adherence . Current pharmacy: [facility] . Interventions o Comprehensive medication review performed. o Verified last fill dates and when patient will need mediations filled again - Review all medications needed for delivery on 03/25/20 today o Utilize [facility] for medication synchronization, packaging and delivery o Discussed recommended vaccines and appropriate timing . Patient self care activities - Over the next 180 days, patient will: o Focus on medication adherence by utilization adherence packaging and medication synchronization o Take medications as prescribed o Report any questions or concerns to PharmD and/or provider(s)  Please see past updates related to this goal by clicking on the "Past Updates" button in the selected goal         The patient verbalized understanding of instructions provided today and agreed to receive a mailed copy of patient instruction and/or educational materials.  Telephone follow up appointment with pharmacy team member scheduled for: 06/14/20 @ 3:30 PM  06/16/20, PharmD Clinical Pharmacist [facility] [phone]   Diabetes Mellitus and Nutrition, Adult When you have diabetes (diabetes mellitus), it is very important to have healthy eating habits because your blood sugar (glucose) levels are greatly affected by what you eat and drink. Eating healthy foods in the appropriate amounts, at about the same times every day, can help you:  Control your blood glucose.  Lower your risk of heart disease.  Improve your blood pressure.  Reach or maintain a healthy weight. Every person with diabetes is different, and each person has different needs for a meal plan. Your health care provider may recommend that you work with a diet and nutrition specialist (dietitian) to make a meal plan that is best for you. Your meal plan may vary depending on  factors such as:  The calories you need.  The medicines you take.  Your weight.  Your blood glucose, blood pressure, and cholesterol levels.  Your activity level.  Other health conditions you have, such as heart or kidney disease. How do carbohydrates affect me? Carbohydrates, also called carbs, affect your blood glucose level more than any other type of food. Eating carbs naturally raises the amount of glucose in your blood. Carb counting is a method for keeping track of how many carbs you eat. Counting carbs is important to keep your blood glucose at a healthy level, especially if you use insulin or take certain oral diabetes medicines. It is important to know how many carbs you can safely have in each meal. This is different for every person. Your dietitian can help you calculate how many carbs you should have at each meal and for each snack. Foods that contain carbs include:  Bread, cereal, rice, pasta, and crackers.  Potatoes and corn.  Peas, beans, and lentils.  Milk and yogurt.  Fruit and juice.  Desserts, such as cakes, cookies, ice cream, and candy. How does alcohol affect me? Alcohol can cause a sudden decrease in blood glucose (hypoglycemia), especially if you use insulin or take certain oral diabetes medicines. Hypoglycemia can be a life-threatening condition. Symptoms of hypoglycemia (sleepiness, dizziness, and confusion) are similar to symptoms of having too  much alcohol. If your health care provider says that alcohol is safe for you, follow these guidelines:  Limit alcohol intake to no more than 1 drink per day for nonpregnant women and 2 drinks per day for men. One drink equals 12 oz of beer, 5 oz of wine, or 1 oz of hard liquor.  Do not drink on an empty stomach.  Keep yourself hydrated with water, diet soda, or unsweetened iced tea.  Keep in mind that regular soda, juice, and other mixers may contain a lot of sugar and must be counted as carbs. What are tips  for following this plan?  Reading food labels  Start by checking the serving size on the "Nutrition Facts" label of packaged foods and drinks. The amount of calories, carbs, fats, and other nutrients listed on the label is based on one serving of the item. Many items contain more than one serving per package.  Check the total grams (g) of carbs in one serving. You can calculate the number of servings of carbs in one serving by dividing the total carbs by 15. For example, if a food has 30 g of total carbs, it would be equal to 2 servings of carbs.  Check the number of grams (g) of saturated and trans fats in one serving. Choose foods that have low or no amount of these fats.  Check the number of milligrams (mg) of salt (sodium) in one serving. Most people should limit total sodium intake to less than 2,300 mg per day.  Always check the nutrition information of foods labeled as "low-fat" or "nonfat". These foods may be higher in added sugar or refined carbs and should be avoided.  Talk to your dietitian to identify your daily goals for nutrients listed on the label. Shopping  Avoid buying canned, premade, or processed foods. These foods tend to be high in fat, sodium, and added sugar.  Shop around the outside edge of the grocery store. This includes fresh fruits and vegetables, bulk grains, fresh meats, and fresh dairy. Cooking  Use low-heat cooking methods, such as baking, instead of high-heat cooking methods like deep frying.  Cook using healthy oils, such as olive, canola, or sunflower oil.  Avoid cooking with butter, cream, or high-fat meats. Meal planning  Eat meals and snacks regularly, preferably at the same times every day. Avoid going long periods of time without eating.  Eat foods high in fiber, such as fresh fruits, vegetables, beans, and whole grains. Talk to your dietitian about how many servings of carbs you can eat at each meal.  Eat 4-6 ounces (oz) of lean protein each  day, such as lean meat, chicken, fish, eggs, or tofu. One oz of lean protein is equal to: ? 1 oz of meat, chicken, or fish. ? 1 egg. ?  cup of tofu.  Eat some foods each day that contain healthy fats, such as avocado, nuts, seeds, and fish. Lifestyle  Check your blood glucose regularly.  Exercise regularly as told by your health care provider. This may include: ? 150 minutes of moderate-intensity or vigorous-intensity exercise each week. This could be brisk walking, biking, or water aerobics. ? Stretching and doing strength exercises, such as yoga or weightlifting, at least 2 times a week.  Take medicines as told by your health care provider.  Do not use any products that contain nicotine or tobacco, such as cigarettes and e-cigarettes. If you need help quitting, ask your health care provider.  Work with a Veterinary surgeoncounselor or  diabetes educator to identify strategies to manage stress and any emotional and social challenges. Questions to ask a health care provider  Do I need to meet with a diabetes educator?  Do I need to meet with a dietitian?  What number can I call if I have questions?  When are the best times to check my blood glucose? Where to find more information:  American Diabetes Association: diabetes.org  Academy of Nutrition and Dietetics: www.eatright.[employer] of Diabetes and Digestive and Kidney Diseases (NIH): CarFlippers.tn Summary  A healthy meal plan will help you control your blood glucose and maintain a healthy lifestyle.  Working with a diet and nutrition specialist (dietitian) can help you make a meal plan that is best for you.  Keep in mind that carbohydrates (carbs) and alcohol have immediate effects on your blood glucose levels. It is important to count carbs and to use alcohol carefully. This information is not intended to replace advice given to you by your health care provider. Make sure you discuss any questions you have with your  health care provider. Document Revised: 05/24/2017 Document Reviewed: 07/16/2016 Elsevier Patient Education  2020 [employer].

## 2020-03-22 NOTE — Chronic Care Management (AMB) (Signed)
Chronic Care Management Pharmacy  Name: Octivia Canion  MRN: [id number] DOB: Dec 07, 1955  Chief Complaint/ HPI  Shannon [name],  64 y.o. , female presents for their Follow-Up CCM visit with the clinical pharmacist via telephone due to COVID-19 Pandemic.  PCP : [name], FNP  Their chronic conditions include: HTN, DM, HLD.   Office Visits: 03/16/20 Patient message: Pt notified HgbA1c stable at 7.5%. Will likely need to increase to Ozempic 31m weekly  02/16/20 OV: DM follow up. Lipid panel results stable. Tolerating Ozempic well (obtained through patient assistance). Continue checking BG 4 times daily. Screened for hepatitis C antibody.   01/28/20: Metformin changed to XR 7585monce daily after PharmD recommendation  12/15/2019: Referral for sleep study.   11/16/2019 - AWV and f/u Diabetes visit - recommend Vitamin D, Calcium and Multivitamin. Referral to PharmD due to cost of Ozempic. Ordered Lipid panel, CMP14+EGFR, A1c, CBC, TSH.   08/11/2019 - recommended annual diabetic eye exam. Patient needs new glucometer for insurance. Recheck A1c. Ordered POCT urinalysis, CMP14+EGFR, Lipid panel. Ordered DEXA scan. Prevnar 13 rx sent to pharmacy.   Consult Visits: 03/16/20 Ophthalmology V w/ Dr. RaZadie RhineCSME in right eye has improved 5 weeks post intravitreal Avastin. Repeat injection today at 5 week interval and examine in 6 weeks.  03/02/20 Neurology Sleep Study: Home sleep test showed obstructive sleep apnea, overall mild, but worth treating if she will feel better. Recommend treatment with autoPAP. [facility] will call pt to arrange autoPAP machine.   02/09/20 Ophthalmology w/ Dr. RaZadie RhineCSME has improved/resolved in left eye. Continue to monitor. CSME did not worsen in right eye at 6 week follow up. Repeat injection today and exam in 5 weeks.   01/06/20 Neurology OV w/ Dr. AtRexene AlbertsPresented for sleep consult.   12/29/2019 - retreating right eye with Avastin. Left eye appears  resolved and will continue to observe.   [facility] edema with Avastin therapy.  [facility] - Slight improvement on intravitreal Avastin, will repeat. Patient has symptoms positive for sleep apnea. Reports interested in being tested for sleep apnea.   [facility] but not improved macular edema at 2 week follow-up after Avastin.    09/29/2019 - [facility] - Vitreal Avastin OD performed. 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   [facility]: [facility] Patient reports A1c is elevated due to conserving medicine prior to getting Medicare 05/26/2019. Blood sugars range from 147-257.   Medications: Outpatient Encounter Medications as of 03/22/2020  Medication Sig  . atorvastatin (LIPITOR) 20 MG tablet Take 1 tablet (20 mg total) by mouth daily. Hold for next 2 weeks  . Cholecalciferol (VITAMIN D-3 PO) Take 1 capsule by mouth daily. 2000 units  . insulin degludec (TRESIBA FLEXTOUCH) 100 UNIT/ML FlexTouch Pen Inject 0.21 mLs (21 Units total) into the skin daily.  . Marland Kitchenisinopril (ZESTRIL) 10 MG tablet Take 1 tablet (10 mg total) by mouth daily.  . metFORMIN (GLUCOPHAGE XR) 750 MG 24 hr tablet Take 1 tablet (750 mg total) by mouth daily with breakfast.  . Semaglutide,0.25 or 0.5MG/DOS, (OZEMPIC, 0.25 OR 0.5 MG/DOSE,) 2 MG/1.5ML SOPN Inject 0.5 mg into the skin once a week.  . blood glucose meter kit and supplies KIT Dispense based on patient and insurance preference. Use 3 times daily as  directed to check blood sugar. Dx code e11.65  . Blood Glucose Monitoring Suppl (ONE TOUCH ULTRA MINI) w/Device KIT 1 each by Does not apply route 4 (four) times daily -  before meals and at bedtime.  Marland Kitchen glucose blood test strip Use as instructed  . glucose blood test strip Use  as instructed to check blood sugar 3 times a day. Dx code e11.65  . Lancets (ONETOUCH ULTRASOFT) lancets Use as instructed to check blood sugars 4 times daily E11.9   No facility-administered encounter medications on file as of 03/22/2020.   No Known Allergies  Current Diagnosis/Assessment:  SDOH Interventions     Most Recent Value  SDOH Interventions  Financial Strain Interventions Other (Comment)  [Will continue to assiste with Ozempic patient assistance through [facility]            This Visit's Progress   . Pharmacy Care Plan       CARE PLAN ENTRY (see longitudinal plan of care for additional care plan information)  Current Barriers:  . Chronic Disease Management support, education, and care coordination needs related to Hypertension, Hyperlipidemia, Diabetes, and Osteopenia   Hypertension BP Readings from Last 3 Encounters:  01/06/20 112/60  12/15/19 130/72  11/16/19 130/86   . Pharmacist Clinical Goal(s): o Over the next 180 days, patient will work with PharmD and providers to maintain BP goal <130/80 . Current regimen:  o Lisinopril 37m daily . Interventions: o Provided dietary and exercise recommendations . Patient self care activities - Over the next 180 days, patient will: o Check BP if symptomatic, document, and provide at future appointments o Ensure daily salt intake < 2300 mg/day o Continue exercising for at least 30 minutes 5 times weekly (150 minutes per week total)  Hyperlipidemia Lab Results  Component Value Date/Time   LDLCALC 102 (H) 11/16/2019 03:50 PM   . Pharmacist Clinical Goal(s): o Over the next 90 days, patient will work with PharmD and providers to achieve LDL goal < 70 . Current regimen:  o Atorvastatin 260mdaily . Interventions: o Provided dietary and exercise recommendations o Discussed appropriate goals for LDL, HDL and triglycerides o Determined patient has not had atorvastatin filled this year - Has  been taking medication she had from previous fills in 2020 . Patient self care activities - Over the next 90 days, patient will: o Exercise for at least 30 minutes daily 5 times per week o Limits fried foods and fatty foods o Use PLATE method for planning a well-balanced diet  Diabetes Lab Results  Component Value Date/Time   HGBA1C 7.4 (H) 11/16/2019 03:50 PM   HGBA1C 9.4 (H) 08/11/2019 09:16 AM   . Pharmacist Clinical Goal(s): o Over the next 90 days, patient will work with PharmD and providers to achieve A1c goal <7% . Current regimen:   Metformin XR 75015mnce daily  Ozempic 0.5 mg into the skin once a week (Fridays)  Tresiba 100 units/mL 21 units into the skin daily . Interventions: o Provided dietary and exercise recommendations o Recommend patient drink 64 ounces of water daily o Will coordinate with [facility] to deliver OneTouch Ultra test strips and lancets to go with meter she received on 9/16 o Will have prescription sent to [facility] for FreeStyle LibBarstowpplies due to difficulty obtaining supplies from Sol[facility]kinProvided patient with sample of FreeStyle Libre 2 sensor in office and taught how to apply sensor o Assisted patient with downloading FreYUM[employer] Brandsapplication for  iPhone to check blood sugar with sensor o Discussed appropriate goal for fasting blood sugar (80-130) . Patient self care activities - Over the next 90 days, patient will: o Check blood sugar 3-4 times daily, document, and provide at future appointments o Contact provider with any episodes of hypoglycemia o Exercise for at least 30 minutes daily 5 times per week o Start metformin XR 761m once daily o Use FreeStyle Libre 2 sensor with iPhone application to check  blood sugar  Osteopenia . Pharmacist Clinical Goal(s) o Over the next 180 days, patient will work with PharmD and providers to take appropriate supplementation to strengthen bones . Current regimen:  o  Vitamin D3 2000 units  daily . Interventions: o Provided patient education about osteopenia and recommended supplements  o Encouraged weight bearing exercises 3 days per week . Patient self care activities - Over the next 90 days, patient will: o Continue calcium 12081mdaily o Start taking Cholecalciferol 2000 units daily (will provide in medication packaging) o Start weight-bearing exercises 3 days per week  Medication management . Pharmacist Clinical Goal(s): o Over the next 180 days, patient will work with PharmD and providers to achieve optimal medication adherence . Current pharmacy: [facility] . Interventions o Comprehensive medication review performed. o Verified last fill dates and when patient will need mediations filled again - Review all medications needed for delivery on 03/25/20 today o Utilize [facility] for medication synchronization, packaging and delivery o Discussed recommended vaccines and appropriate timing . Patient self care activities - Over the next 180 days, patient will: o Focus on medication adherence by utilization adherence packaging and medication synchronization o Take medications as prescribed o Report any questions or concerns to PharmD and/or provider(s)  Please see past updates related to this goal by clicking on the "Past Updates" button in the selected goal        Hyperlipidemia   LDL goal < 70  Lipid Panel     Component Value Date/Time   CHOL 192 02/16/2020 1040   TRIG 202 (H) 02/16/2020 1040   HDL 55 02/16/2020 1040   LDLCALC 102 (H) 02/16/2020 1040    Hepatic Function Latest Ref Rng & Units 02/16/2020 11/16/2019 08/11/2019  Total Protein 6.0 - 8.5 g/dL 7.2 7.1 7.2  Albumin 3.8 - 4.8 g/dL 4.9(H) 4.7 4.6  AST 0 - 40 IU/L 18 13 12   ALT 0 - 32 IU/L 21 17 18   Alk Phosphatase 48 - 121 IU/L 98 91 88  Total Bilirubin 0.0 - 1.2 mg/dL 0.3 0.5 0.5    The 10-year ASCVD risk score (GMikey BussingC Jr., et al., 2013) is: 21.2%   Values used to calculate the  score:     Age: [age]ears     Sex: Female     Is Non-Hispanic African American: Yes     Diabetic: Yes     Tobacco smoker: No     Systolic Blood Pressure: 13349mHg     Is BP treated: Yes     HDL Cholesterol: 55 mg/dL     Total Cholesterol: 192 mg/dL   Patient has failed these meds in past: N/A Patient is currently uncontrolled on the following medications:  . Atorvastatin 20 mg daily  We discussed:   Discussed that pt has not filled atorvastatin in 2021  Pt states that she had leftover medication from fills in 2020 and has been taking it daily  Advised pt to be cautious of taking medications and to check expiration dates  Will fill atorvastatin with other medications for delivery 03/25/20  Plan Continue current medications  Diabetes   Recent Relevant Labs: Lab Results  Component Value Date/Time   HGBA1C 7.5 (H) 02/25/2020 04:32 PM   HGBA1C 7.4 (H) 11/16/2019 03:50 PM   MICROALBUR 80 08/11/2019 10:29 AM    Checking BG: Daily  Recent FBG Readings:  Recent pre-meal BG readings:  Recent 2hr PP BG readings:  Recent HS BG readings: Highest 169, lowest 104, 140, 120 Patient has failed these meds in past: Victoza, Actos Patient is currently uncontrolled on the following medications:   Metformin XR 773m once daily  Ozempic 0.5 mg into the skin once a week (Fridays)  TTyler Aas100 units/mL 21 units into the skin daily  Last diabetic Foot exam: 06/01/19 Last diabetic Eye exam:  Lab Results  Component Value Date/Time   HMDIABEYEEXA Retinopathy (A) 09/14/2019 12:00 AM    We discussed:   FreeStyle Libre 2 system  Gave pt 1 sample sensor and showed how to apply  Assisted pt with downloading mobile app for [employer]2  Coordinate with PCP staff to send in prescription for FColgate-Palmolive2 sensors to [facility]  Determined pt received OneTouch Ultra meter on 9/16, but states that it does not work with the test strips she has  Will coordinate with  [facility] to send OneTouch Ultra strips to go with meter that was sent on 9/16  Per pharmacy, strips can be filled on 10/5  FBG goal 80-130 . Diet extensively o Trying to watch what she eats o Will get more of a regimen once she retires . Exercise extensively o Water aerobics 4 times a week (45 minutes) o Silver Sneakers 2 times weekly (45 minutes) o Congratulated pt on exercise regimen  Plan Continue current medications  Coordinate with PCP staff to in prescription for FreeStyle Libre 2 sensors  Hypertension   Office blood pressures are  BP Readings from Last 3 Encounters:  03/22/20 132/70  02/16/20 130/70  01/06/20 112/60   Patient has failed these meds in the past: N/A Patient is currently controlled on the following medications:   Lisinopril 10 mg daily  Patient checks BP at home infrequently  Patient home BP readings are ranging: None to provide  We discussed:  No issues with blood pressure   Denies headaches, lightheadedness, dizziness  Plan Continue current medications   Osteopenia / Osteoporosis   Last DEXA Scan: 12/09/2019   T-Score femoral neck: -1.7  T-Score forearm radius: -0.3  10-year probability of major osteoporotic fracture: 4.3%  10-year probability of hip fracture: 0.6%  No results found for: VD25OH   Patient is not a candidate for pharmacologic treatment  Patient has failed these meds in past: N/A Patient is currently uncontrolled on the following medications: N/A  We discussed:  Recommend 1200 mg of calcium daily from dietary and supplemental sources. Recommend weight-bearing and muscle strengthening exercises for building and maintaining bone density.   Will include VitaminD 2000 units with medication delivery  Plan Continue current medications  Recommend Vitamin D at next PCP office visit  Health Maintenance   Patient is currently on the following medications:   Tylenol 500 mg-1000 mg q6h prn pain . Vitamin C 1000  mg daily  . Fish Oil daily  Plan Continue current medications  Vaccines   Reviewed and discussed patient's vaccination history.    Immunization History  Administered Date(s) Administered  . Fluad Quad(high Dose 65+) 03/22/2020  . Influenza,inj,Quad PF,6+ Mos 04/18/2018  .  Influenza-Unspecified 05/01/2019  . PFIZER SARS-COV-2 Vaccination 09/18/2019, 10/14/2019  . Pneumococcal Polysaccharide-23 05/30/2018  . Tdap 11/28/2017   Plan Recommend HD flu shot today in office, Prevnar13 at local pharmacy 2 weeks ([facility] [address]), Shingrix series at local pharmacy in 2022  Medication Management   Pt uses [facility] for all medications Uses pill box? Yes Pt endorses 80% compliance  We discussed:  Importance of taking all medications as directed every day Reviewed all medications prior to scheduled delivery on 03/25/20  Discussed adherence packaging  Plan Utilize [facility] for medication synchronization, packaging and delivery  Verbal consent obtained for [facility] enhanced pharmacy services (medication synchronization, adherence packaging, delivery coordination). A medication sync plan was created to allow patient to get all medications delivered once every 30 to 90 days per patient preference. Patient understands they have freedom to choose pharmacy and clinical pharmacist will coordinate care between all prescribers and [facility].   Follow up: 12 week phone visit  [name], PharmD Clinical Pharmacist [facility] [phone]

## 2020-03-23 ENCOUNTER — Other Ambulatory Visit: Payer: Self-pay

## 2020-03-23 MED ORDER — FREESTYLE LIBRE 14 DAY READER DEVI: 1 refills | Status: DC

## 2020-03-23 MED ORDER — FREESTYLE LIBRE 14 DAY SENSOR MISC: 1 refills | Status: DC

## 2020-03-25 ENCOUNTER — Other Ambulatory Visit: Payer: Self-pay

## 2020-03-25 NOTE — Patient Outreach (Signed)
  [facility]) Care Management Chronic Special Needs Program    03/25/2020  Name: Shannon [name], DOB: 1955/08/09  MRN: [id number]   Ms. Alonah Lineback is enrolled in a chronic special needs plan for Diabetes.  Telephone call to client for CSNP assessment follow up. Unable to reach.HIPAA compliant voice message left with call back phone number and return call request.   PLAN; RNCM will attempt 2nd telephone call to client in 1 week.   George Ina RN,BSN,CCM Chronic Care Management Coordinator [facility] Care Management [phone]

## 2020-04-04 ENCOUNTER — Telehealth: Payer: Self-pay | Admitting: Pharmacist

## 2020-04-04 ENCOUNTER — Other Ambulatory Visit: Payer: Self-pay

## 2020-04-04 NOTE — Patient Outreach (Signed)
  [facility]) Care Management Chronic Special Needs Program    04/04/2020  Name: Shannon [name], DOB: [birth year]  MRN: [id number]   Ms. Josey Dettmann is enrolled in a chronic special needs plan for Diabetes. Second telephone outreach to client for CSNP assessment follow up and Health risk assessment completion. Unable to reach client. HIPAA compliant voice message left with call back phone number and return call request.   PLAN: RNCM will attempt 3rd telephone outreach to client in 2 weeks.   George Ina RN,BSN,CCM Chronic Care Management Coordinator [facility] Care Management [phone]

## 2020-04-04 NOTE — Chronic Care Management (AMB) (Signed)
Chronic Care Management Pharmacy Assistant    Name: Shannon [name]  MRN: [id number] DOB: 1955-11-03   Reason for Encounter: Disease State - Diabetes Adherence Call.   Patient Questions:  1.  Have you seen any other providers since your last visit?                   No  2.  Any changes in your medicines or health? No    PCP : [name], FNP  Allergies:  No Known Allergies  Medications: Outpatient Encounter Medications as of 04/04/2020  Medication Sig  . atorvastatin (LIPITOR) 20 MG tablet Take 1 tablet (20 mg total) by mouth daily. Hold for next 2 weeks  . blood glucose meter kit and supplies KIT Dispense based on patient and insurance preference. Use 3 times daily as directed to check blood sugar. Dx code e11.65  . Blood Glucose Monitoring Suppl (ONE TOUCH ULTRA MINI) w/Device KIT 1 each by Does not apply route 4 (four) times daily -  before meals and at bedtime.  . Cholecalciferol (VITAMIN D-3 PO) Take 1 capsule by mouth daily. 2000 units  . Continuous Blood Gluc Receiver (FREESTYLE LIBRE 14 DAY READER) DEVI USE TO CHECK BLOOD SUGARS FOUR TIMES A DAY DX:E11.65  . Continuous Blood Gluc Sensor (FREESTYLE LIBRE 14 DAY SENSOR) MISC USE TO CHECK BLOOD SUGARS FOUR TIMES A DAY DX:E11.65  . glucose blood test strip Use as instructed  . glucose blood test strip Use as instructed to check blood sugar 3 times a day. Dx code e11.65  . insulin degludec (TRESIBA FLEXTOUCH) 100 UNIT/ML FlexTouch Pen Inject 0.21 mLs (21 Units total) into the skin daily.  . Lancets (ONETOUCH ULTRASOFT) lancets Use as instructed to check blood sugars 4 times daily E11.9  . lisinopril (ZESTRIL) 10 MG tablet Take 1 tablet (10 mg total) by mouth daily.  . metFORMIN (GLUCOPHAGE XR) 750 MG 24 hr tablet Take 1 tablet (750 mg total) by mouth daily with breakfast.  . Semaglutide,0.25 or 0.5MG/DOS, (OZEMPIC, 0.25 OR 0.5 MG/DOSE,) 2 MG/1.5ML SOPN Inject 0.5 mg into the skin once a week.   No  facility-administered encounter medications on file as of 04/04/2020.    Current Diagnosis: Patient Active Problem List   Diagnosis Date Noted  . Sleep apnea in adult 03/16/2020  . Vitreous floaters of left eye 12/29/2019  . Other long term (current) drug therapy 11/16/2019  . Snores 10/27/2019  . Moderate nonproliferative diabetic retinopathy of right eye with macular edema ([place]) 10/06/2019  . Moderate nonproliferative diabetic retinopathy of left eye with macular edema associated with type 2 diabetes mellitus ([place]) 10/06/2019  . Retinal exudates and deposits 10/06/2019  . Cough 11/20/2018  . Other fatigue 11/20/2018  . COVID-19 virus infection 10/13/2018  . Hypokalemia 10/13/2018  . Pyelonephritis 06/17/2018   Recent Relevant Labs: Lab Results  Component Value Date/Time   HGBA1C 7.5 (H) 02/25/2020 04:32 PM   HGBA1C 7.4 (H) 11/16/2019 03:50 PM   MICROALBUR 80 08/11/2019 10:29 AM    Kidney Function Lab Results  Component Value Date/Time   CREATININE 0.55 (L) 02/16/2020 10:40 AM   CREATININE 0.62 11/16/2019 03:50 PM   GFRNONAA 99 02/16/2020 10:40 AM   GFRAA 114 02/16/2020 10:40 AM    . Current antihyperglycemic regimen:   o Metformin XR 750 mg once a day o Ozempic 0.5 MG into skin once a week (Friday) o Tresiba 100 units/ Ml 21 units into the skin daily  . What recent interventions/DTPs  have been made to improve glycemic control:  o Patient states she does take her medication as directed by provider. . Have there been any recent hospitalizations or ED visits since last visit with CPP? No   . Patient denies hypoglycemic symptoms, including Pale, Sweaty, Shaky, Hungry, Nervous/irritable and Vision changes . Patient denies hyperglycemic symptoms, including blurry vision, excessive thirst, fatigue, polyuria and weakness   . How often are you checking your blood sugar? Patient has the [place] she states she is loving life since she got her freestyle [place].   . What  are your blood sugars ranging? Patient states blood sugar has been around 100-140 states she had 261 x one time 140 x two 139 x 2 the rest has been lower. Patient was just leaving work. o Fasting: None   o Before meals: none o After meals: 04/05/2020 - 134 today, patient works night shift so she had already ate this morning. o Bedtime: None  . During the week, how often does your blood glucose drop below 70? Patient states she has had one reading under 70 . (69)  . Are you checking your feet daily/regularly? Patient denies any sores or numbness, tingling or pain.  Adherence Review: Is the patient currently on a STATIN medication? Yes. Atorvastatin 20 mg  Is the patient currently on ACE/ARB medication? Yes. Does the patient have >5 day gap between last estimated fill dates? No    Goals Addressed            This Visit's Progress   . Pharmacy Care Plan   On track    CARE PLAN ENTRY (see longitudinal plan of care for additional care plan information)  Current Barriers:  . Chronic Disease Management support, education, and care coordination needs related to Hypertension, Hyperlipidemia, Diabetes, and Osteopenia   Hypertension BP Readings from Last 3 Encounters:  01/06/20 112/60  12/15/19 130/72  11/16/19 130/86   . Pharmacist Clinical Goal(s): o Over the next 180 days, patient will work with PharmD and providers to maintain BP goal <130/80 . Current regimen:  o Lisinopril 5m daily . Interventions: o Provided dietary and exercise recommendations . Patient self care activities - Over the next 180 days, patient will: o Check BP if symptomatic, document, and provide at future appointments o Ensure daily salt intake < 2300 mg/day o Continue exercising for at least 30 minutes 5 times weekly (150 minutes per week total)  Hyperlipidemia Lab Results  Component Value Date/Time   LDLCALC 102 (H) 11/16/2019 03:50 PM   . Pharmacist Clinical Goal(s): o Over the next 90 days,  patient will work with PharmD and providers to achieve LDL goal < 70 . Current regimen:  o Atorvastatin 23mdaily . Interventions: o Provided dietary and exercise recommendations o Discussed appropriate goals for LDL, HDL and triglycerides o Determined patient has not had atorvastatin filled this year - Has been taking medication she had from previous fills in 2020 . Patient self care activities - Over the next 90 days, patient will: o Exercise for at least 30 minutes daily 5 times per week o Limits fried foods and fatty foods o Use PLATE method for planning a well-balanced diet  Diabetes Lab Results  Component Value Date/Time   HGBA1C 7.4 (H) 11/16/2019 03:50 PM   HGBA1C 9.4 (H) 08/11/2019 09:16 AM   . Pharmacist Clinical Goal(s): o Over the next 90 days, patient will work with PharmD and providers to achieve A1c goal <7% . Current regimen:   Metformin  XR 76m once daily  Ozempic 0.5 mg into the skin once a week (Fridays)  Tresiba 100 units/mL 21 units into the skin daily . Interventions: o Provided dietary and exercise recommendations o Recommend patient drink 64 ounces of water daily o Will coordinate with [facility] to deliver OneTouch Ultra test strips and lancets to go with meter she received on 9/16 o Will have prescription sent to [facility] for FreeStyle [place] Provided patient with sample of FreeStyle Libre 2 sensor in office and taught how to apply sensor o Assisted patient with downloading FreeStyle Libre 2 application for iPhone to check blood sugar with sensor o Discussed appropriate goal for fasting blood sugar (80-130) . Patient self care activities - Over the next 90 days, patient will: o Check blood sugar 3-4 times daily, document, and provide at future appointments o Contact provider with any episodes of hypoglycemia o Exercise for at least 30 minutes daily 5 times per week o Start metformin XR 7561m once daily o Use FreeStyle Libre 2 sensor with iPhone application to check  blood sugar  Osteopenia . Pharmacist Clinical Goal(s) o Over the next 180 days, patient will work with PharmD and providers to take appropriate supplementation to strengthen bones . Current regimen:  o  Vitamin D3 2000 units daily . Interventions: o Provided patient education about osteopenia and recommended supplements  o Encouraged weight bearing exercises 3 days per week . Patient self care activities - Over the next 90 days, patient will: o Continue calcium 120048maily o Start taking Cholecalciferol 2000 units daily (will provide in medication packaging) o Start weight-bearing exercises 3 days per week  Medication management . Pharmacist Clinical Goal(s): o Over the next 180 days, patient will work with PharmD and providers to achieve optimal medication adherence . Current pharmacy: [facility] . Interventions o Comprehensive medication review performed. o Verified last fill dates and when patient will need mediations filled again - Review all medications needed for delivery on 03/25/20 today o Utilize [facility] pharmacy for medication synchronization, packaging and delivery o Discussed recommended vaccines and appropriate timing . Patient self care activities - Over the next 180 days, patient will: o Focus on medication adherence by utilization adherence packaging and medication synchronization o Take medications as prescribed o Report any questions or concerns to PharmD and/or provider(s)  Please see past updates related to this goal by clicking on the "Past Updates" button in the selected goal         Follow-Up:  Pharmacist Review- Patient states she loves her new FreeStyle Libre , states life is so much better for her. Patient is going to water aerobics 4 times week / silver sneakers 2 times a week, states she has been drinking 3 - 16 ounces of water a day.  ChrBeverly MilchPP.  Notified.  VerJudithann SheenCMLexington Va Medical Center - Leestowninical Pharmacist Assistant [phone]

## 2020-04-14 ENCOUNTER — Ambulatory Visit: Payer: Self-pay

## 2020-04-18 ENCOUNTER — Telehealth: Payer: Self-pay | Admitting: Pharmacist

## 2020-04-18 NOTE — Chronic Care Management (AMB) (Signed)
Chronic Care Management Pharmacy Assistant   Name: Shannon [name]  MRN: [id number] DOB: Mar 20, 1956  Reason for Encounter: Medication Review/ Monthly Dispensing Call.  Patient Questions:  1.  Have you seen any other providers since your last visit? Yes, 10/11/2021Leata Mouse, CPP (CCM Pharmacist)  2.  Any changes in your medicines or health? No   PCP : [name], FNP  Allergies:  No Known Allergies  Medications: Outpatient Encounter Medications as of 04/18/2020  Medication Sig  . atorvastatin (LIPITOR) 20 MG tablet Take 1 tablet (20 mg total) by mouth daily. Hold for next 2 weeks  . blood glucose meter kit and supplies KIT Dispense based on patient and insurance preference. Use 3 times daily as directed to check blood sugar. Dx code e11.65  . Blood Glucose Monitoring Suppl (ONE TOUCH ULTRA MINI) w/Device KIT 1 each by Does not apply route 4 (four) times daily -  before meals and at bedtime.  . Cholecalciferol (VITAMIN D-3 PO) Take 1 capsule by mouth daily. 2000 units  . Continuous Blood Gluc Receiver (FREESTYLE LIBRE 14 DAY READER) DEVI USE TO CHECK BLOOD SUGARS FOUR TIMES A DAY DX:E11.65  . Continuous Blood Gluc Sensor (FREESTYLE LIBRE 14 DAY SENSOR) MISC USE TO CHECK BLOOD SUGARS FOUR TIMES A DAY DX:E11.65  . glucose blood test strip Use as instructed  . glucose blood test strip Use as instructed to check blood sugar 3 times a day. Dx code e11.65  . insulin degludec (TRESIBA FLEXTOUCH) 100 UNIT/ML FlexTouch Pen Inject 0.21 mLs (21 Units total) into the skin daily.  . Lancets (ONETOUCH ULTRASOFT) lancets Use as instructed to check blood sugars 4 times daily E11.9  . lisinopril (ZESTRIL) 10 MG tablet Take 1 tablet (10 mg total) by mouth daily.  . metFORMIN (GLUCOPHAGE XR) 750 MG 24 hr tablet Take 1 tablet (750 mg total) by mouth daily with breakfast.  . Semaglutide,0.25 or 0.5MG/DOS, (OZEMPIC, 0.25 OR 0.5 MG/DOSE,) 2 MG/1.5ML SOPN Inject 0.5 mg into the skin once a  week.   No facility-administered encounter medications on file as of 04/18/2020.    Current Diagnosis: Patient Active Problem List   Diagnosis Date Noted  . Sleep apnea in adult 03/16/2020  . Vitreous floaters of left eye 12/29/2019  . Other long term (current) drug therapy 11/16/2019  . Snores 10/27/2019  . Moderate nonproliferative diabetic retinopathy of right eye with macular edema ([place]) 10/06/2019  . Moderate nonproliferative diabetic retinopathy of left eye with macular edema associated with type 2 diabetes mellitus ([place]) 10/06/2019  . Retinal exudates and deposits 10/06/2019  . Cough 11/20/2018  . Other fatigue 11/20/2018  . COVID-19 virus infection 10/13/2018  . Hypokalemia 10/13/2018  . Pyelonephritis 06/17/2018   Reviewed chart for medication changes ahead of medication coordination call.   No OVs, Consults, or hospital visits since last care coordination call/Pharmacist visit. No medication changes indicated.  BP Readings from Last 3 Encounters:  03/22/20 132/70  02/16/20 130/70  01/06/20 112/60    Lab Results  Component Value Date   HGBA1C 7.5 (H) 02/25/2020     Patient obtains medications through Adherence Packaging  90 Days   Last adherence delivery included: Metformin 750 mg 1 tablet daily (breakfast), Atorvastatin 20 mg 1 tablet daily(bedtime), Lisinopril 10 mg 1 tablet daily (breakfast), [place] Aas Flextouch 21 units sq daily(breakfast), Onetouch ultra test strip and lancets to check blood sugars three times a day (before breakfast, lunch, evening meal).  Patient did not decline any medications last month.  Patient is due for next adherence delivery on: 04/21/2020. 04/18/2020- Called patient, left message to return call to review medications and coordinate delivery. 04/23/2020- Sent text message to patient, awaiting response.  Follow-Up:  Coordination of Enhanced Pharmacy Services and Pharmacist Review  No return call from patient, will follow up with  patient next week to review medications.  [name], [place] Pharmacist Assistant [phone]

## 2020-04-20 ENCOUNTER — Other Ambulatory Visit: Payer: Self-pay

## 2020-04-20 NOTE — Patient Outreach (Signed)
[facility]) Care Management Chronic Special Needs Program  04/20/2020  Name: Shannon [name] DOB: Jul 16, 1955  MRN: [id number]  Shannon [name] is enrolled in a chronic special needs plan for Diabetes.  Telephone call to client regarding CSNP assessment update. HIPAA verified. Client states she is doing ok. She reports regular follow up with her doctor. She states she continues to provider her own transportation. Client states she can afford her medications and takes them as prescribed. Client reports being recently diagnosed with sleep apnea after testing. She states she is  Scheduled for a follow up in November to discuss further with her doctor and receive a CPAP machine. Client states she is using the free style libre to monitor her blood sugars. Client states, " I love it." RNCM congratulated client in bringing down her Hgb A1c to 7.5 from previous 11.5.  Client states she continues to watch what she eats and exercises.  Goals Addressed              This Visit's Progress     COMPLETED: Client understands the importance of follow-up with providers by attending scheduled visits   On track     Primary care provider: 02/16/20 and 12/15/19 Ophthalmology appointment: 03/16/20 Continue to follow up with your doctor as recommended.        client will report receiving CPAP machine in 12 months.        Keep follow up appointment with your doctor as recommended.  Contact your RN case manager if you have difficulty obtaining your CPAP equipment once ordered.  RN case manager will send client education article:  How to use a CPAP or BPAP machine and Sleep apnea in adults.       COMPLETED: Diabetes Patient stated goal: " I want to find a new eye doctor" (pt-stated)   On track     Client reports having an established eye doctor and receiving regular follow up for Diabetic Retinopathy.        HEMOGLOBIN A1C < 7        Re-Discussed diabetes self management  actions:  Glucose monitoring per provider recommendation  Check feet daily  Visit provider every 3-6 months as directed  Hbg A1C level every 3-6 months.  Eye Exam yearly  Carbohydrate controlled meal planning  Taking diabetes medication as prescribed by provider  Continue physical activity       Maintain timely refills of diabetic medication as prescribed within the year .   On track     Client reports maintaining  timely refills of her diabetic medication.  Continue to take your medication as prescribed.  Contact your RN case manager if you need assistance obtaining your medicine.       COMPLETED: Obtain annual  Lipid Profile, LDL-C   On track     Lipid profile completed 02/16/20       COMPLETED: Obtain Annual Eye (retinal)  Exam         Diabetic eye exam completed 03/16/20      COMPLETED: Obtain Annual Foot Exam   On track     Annual foot exam completed 06/01/19       COMPLETED: Obtain annual screen for micro albuminuria (urine) , nephropathy (kidney problems)   On track     Annual micro albuminuria completed 08/11/19      COMPLETED: Obtain Hemoglobin A1C at least 2 times per year   On track     Hgb A1c completed on 03/02/20 and 11/16/19  COMPLETED: Visit Primary Care Provider or Endocrinologist at least 2 times per year    On track     Primary care provider: 02/16/20 and 12/15/19        Plan:  Send successful outreach letter with a copy of their individualized care plan, Send individual care plan to provider and Send educational material  Chronic care management coordinator will outreach in:  12 months    George Ina RN,BSN,CCM Chronic Care Management Coordinator [facility] Care Management [phone]

## 2020-05-04 ENCOUNTER — Encounter (INDEPENDENT_AMBULATORY_CARE_PROVIDER_SITE_OTHER): Payer: Self-pay | Admitting: Ophthalmology

## 2020-05-04 ENCOUNTER — Other Ambulatory Visit: Payer: Self-pay

## 2020-05-04 ENCOUNTER — Ambulatory Visit (INDEPENDENT_AMBULATORY_CARE_PROVIDER_SITE_OTHER): Payer: HMO | Admitting: Ophthalmology

## 2020-05-04 DIAGNOSIS — G4730 Sleep apnea, unspecified: Secondary | ICD-10-CM | POA: Diagnosis not present

## 2020-05-04 DIAGNOSIS — H35071 Retinal telangiectasis, right eye: Secondary | ICD-10-CM | POA: Diagnosis not present

## 2020-05-04 DIAGNOSIS — E113311 Type 2 diabetes mellitus with moderate nonproliferative diabetic retinopathy with macular edema, right eye: Secondary | ICD-10-CM

## 2020-05-04 NOTE — Assessment & Plan Note (Signed)
Macular telangiectasis (MAC-TEL), or parafoveal telangiectasis is a condition of "unknown" cause.  Findings in or near the macula (center of vision) consist of microaneurysms (leaking small capillaries), often with leakage of fluid which in the active phase can impact fine discriminatory vision, and in some cases trigger profound scarring in the macula, with severe permanent vision loss.  Standard treatment is observation and periodic examinations to monitor for treatable complications.   The cause  of this condition is "unknown".  However, the practice of Dr. Zadie Rhine has discovered an association with sleep apnea with its nightly periods of low oxygen in the blood stream (hypoxia), retained carbon dioxide (hypercapnia), associated with transient nocturnal hypertensive episodes.   More recently, some patients also been found to have advanced lung disease, whether asthma or COPD, with similar findings.  Dr. Zadie Rhine has been evaluating the association of sleep apnea, nightly hypoxia, and Macular telangiectasis for over 18 years.  Most patients are found to be noncompliant with sleep apnea therapy or testing in the past.  Resumption of CPAP or similar therapy is strongly recommended if ordered in the past.  Upon review of risk factors or findings positive for sleep apnea, more formal, extensive sleep laboratory or home testing, may be recommended.  Numerous patients, proven to have MAC-TEL, have improved or resolved their ey condition promptly, within weeks, of the use of nighttime oxygen supplementation or continuous positive airway pressure (CPAP).  At 7-week follow-up interval, apparent CSME OD is continuing to remain stable or worsened.  Despite use of intravitreal Avastin.  Patient is soon to commence use of nightly CPAP after sleep testing confirm presence of sleep apnea.  If this form of CSME improves dramatically over the coming weeks  Thus apparent CSME today will not be treated as we await the  commencement of CPAP use nightly planned in 1 week  We will schedule the patient to return 1 week after the use of CPAP as begun

## 2020-05-04 NOTE — Assessment & Plan Note (Signed)
Findings confirmed, mask fitting commences next week and likely use shortly thereafter

## 2020-05-04 NOTE — Assessment & Plan Note (Signed)
OD perifoveal CME on the temporal aspect of the macula, worse at 7-week  post intravitreal Avastin.  This suggest that this is not diabetic macular edema but instead could be MAC-TEL.  I reviewed the pictures with the patient and listed her decision making process of avoiding injection of Avastin today, and return visit here 1 week post commencement of use of nightly CPAP to look for improvement or changes in the macular contour OD

## 2020-05-04 NOTE — Progress Notes (Addendum)
05/04/2020     CHIEF COMPLAINT Patient presents for Retina Follow Up   HISTORY OF PRESENT ILLNESS: Shannon [name] is a 64 y.o. female who presents to the clinic today for:   HPI    Retina Follow Up    Patient presents with  Diabetic Retinopathy.  In right eye.  This started 7 weeks ago.  Severity is mild.  Duration of 7 weeks.  Since onset it is stable.          Comments    7 Week Diabetic F/U OD, poss Avastin OD  Pt denies noticeable changes to [place] OU since last visit. Pt denies ocular pain, flashes of light, or floaters OU.  LBS: 110 this AM       Last edited by Rockie Neighbours, [place] on 05/04/2020 10:49 AM. (History)      Referring physician: [name], [address] [place],  [place] [address]  HISTORICAL INFORMATION:   Selected notes from the [facility]    Lab Results  Component Value Date   HGBA1C 7.5 (H) 02/25/2020     CURRENT MEDICATIONS: No current outpatient medications on file. (Ophthalmic Drugs)   No current facility-administered medications for this visit. (Ophthalmic Drugs)   Current Outpatient Medications (Other)  Medication Sig   atorvastatin (LIPITOR) 20 MG tablet Take 1 tablet (20 mg total) by mouth daily. Hold for next 2 weeks   blood glucose meter kit and supplies KIT Dispense based on patient and insurance preference. Use 3 times daily as directed to check blood sugar. Dx code e11.65   Blood Glucose Monitoring Suppl (ONE TOUCH ULTRA MINI) w/Device KIT 1 each by Does not apply route 4 (four) times daily -  before meals and at bedtime.   Cholecalciferol (VITAMIN D-3 PO) Take 1 capsule by mouth daily. 2000 units   Continuous Blood Gluc Receiver (FREESTYLE LIBRE 14 DAY READER) DEVI USE TO CHECK BLOOD SUGARS FOUR TIMES A DAY DX:E11.65   Continuous Blood Gluc Sensor (FREESTYLE LIBRE 14 DAY SENSOR) MISC USE TO CHECK BLOOD SUGARS FOUR TIMES A DAY DX:E11.65   glucose blood test strip Use as instructed    glucose blood test strip Use as instructed to check blood sugar 3 times a day. Dx code e11.65   insulin degludec (TRESIBA FLEXTOUCH) 100 UNIT/ML FlexTouch Pen Inject 0.21 mLs (21 Units total) into the skin daily.   Lancets (ONETOUCH ULTRASOFT) lancets Use as instructed to check blood sugars 4 times daily E11.9   lisinopril (ZESTRIL) 10 MG tablet Take 1 tablet (10 mg total) by mouth daily.   metFORMIN (GLUCOPHAGE XR) 750 MG 24 hr tablet Take 1 tablet (750 mg total) by mouth daily with breakfast.   Semaglutide,0.25 or 0.5MG/DOS, (OZEMPIC, 0.25 OR 0.5 MG/DOSE,) 2 MG/1.5ML SOPN Inject 0.5 mg into the skin once a week.   No current facility-administered medications for this visit. (Other)      REVIEW OF SYSTEMS:    ALLERGIES No Known Allergies  PAST MEDICAL HISTORY Past Medical History:  Diagnosis Date   Diabetes mellitus without complication ([place])    Hypercholesteremia    Hypertension    Sleep apnea    Newly diagnosed in September 2021   Past Surgical History:  Procedure Laterality Date   ABDOMINAL HYSTERECTOMY     CATARACT EXTRACTION Bilateral    CESAREAN SECTION     MYOMECTOMY      FAMILY HISTORY Family History  Problem Relation Age of Onset   Hypertension Mother  Diabetes Mother    Stroke Mother    Diabetes Father    Hypertension Father    Stroke Father    Hypertension Son    Diabetes Son    Breast cancer Neg Hx     SOCIAL HISTORY Social History   Tobacco Use   Smoking status: Former Smoker    Quit date: 2006    Years since quitting: 15.8   Smokeless tobacco: Never Used   Tobacco comment: quit 10 yrs  Vaping Use   Vaping Use: Never used  Substance Use Topics   Alcohol use: Yes    Comment: 1 glass of wine sometimes daily    Drug use: Never         OPHTHALMIC EXAM:  Base Eye Exam    Visual Acuity (ETDRS)      Right Left   Dist [place] 20/20 -2 20/20 -2       Tonometry (Tonopen, 10:49 AM)      Right Left   Pressure  17 13       Pupils      Pupils Dark Light Shape React APD   Right PERRL 3 2 Round Brisk None   Left PERRL 3 2 Round Brisk None       Visual Fields (Counting fingers)      Left Right    Full Full       Extraocular Movement      Right Left    Full Full       Neuro/Psych    Oriented x3: Yes   Mood/Affect: Normal       Dilation    Right eye: 1.0% Mydriacyl, 2.5% Phenylephrine @ 10:52 AM        Slit Lamp and Fundus Exam    External Exam      Right Left   External Normal Normal       Slit Lamp Exam      Right Left   Lids/Lashes Normal Normal   Conjunctiva/Sclera White and quiet White and quiet   Cornea Clear Clear   Anterior Chamber Deep and quiet Deep and quiet   Iris Round and reactive Round and reactive   Lens Posterior chamber intraocular lens Posterior chamber intraocular lens   Anterior Vitreous Normal Normal       Fundus Exam      Right Left   Posterior Vitreous Normal    Disc Normal    C/D Ratio 0.2    Macula Clinically significant macular edema, Exudates, Macular thickening, Mild clinically significant macular edema    Vessels NPDR- Moderate    Periphery Normal           IMAGING AND PROCEDURES  Imaging and Procedures for 05/04/20  OCT, Retina - OU - Both Eyes       Right Eye Quality was good. Scan locations included subfoveal. Central Foveal Thickness: 352. Progression has worsened. Findings include abnormal foveal contour, cystoid macular edema.   Left Eye Quality was good. Scan locations included subfoveal. Central Foveal Thickness: 293. Findings include normal foveal contour.   Notes OD perifoveal CME on the temporal aspect of the macula, worse at 7-week  post intravitreal Avastin.  This suggest that this is not diabetic macular edema but instead could be MAC-TEL.  I reviewed the pictures with the patient and listed her decision making process of avoiding injection of Avastin today, and return visit here 1 week post commencement of use  of nightly CPAP to look for improvement or changes in  the macular contour OD                ASSESSMENT/PLAN:  Sleep apnea in adult Findings confirmed, mask fitting commences next week and likely use shortly thereafter  Type 2 macular telangiectasis of right eye Macular telangiectasis (MAC-TEL), or parafoveal telangiectasis is a condition of "unknown" cause.  Findings in or near the macula (center of vision) consist of microaneurysms (leaking small capillaries), often with leakage of fluid which in the active phase can impact fine discriminatory vision, and in some cases trigger profound scarring in the macula, with severe permanent vision loss.  Standard treatment is observation and periodic examinations to monitor for treatable complications.   The cause  of this condition is "unknown".  However, the practice of Dr. Zadie Rhine has discovered an association with sleep apnea with its nightly periods of low oxygen in the blood stream (hypoxia), retained carbon dioxide (hypercapnia), associated with transient nocturnal hypertensive episodes.   More recently, some patients also been found to have advanced lung disease, whether asthma or COPD, with similar findings.  Dr. Zadie Rhine has been evaluating the association of sleep apnea, nightly hypoxia, and Macular telangiectasis for over 18 years.  Most patients are found to be noncompliant with sleep apnea therapy or testing in the past.  Resumption of CPAP or similar therapy is strongly recommended if ordered in the past.  Upon review of risk factors or findings positive for sleep apnea, more formal, extensive sleep laboratory or home testing, may be recommended.  Numerous patients, proven to have MAC-TEL, have improved or resolved their ey condition promptly, within weeks, of the use of nighttime oxygen supplementation or continuous positive airway pressure (CPAP).  At 7-week follow-up interval, apparent CSME OD is continuing to remain stable or worsened.   Despite use of intravitreal Avastin.  Patient is soon to commence use of nightly CPAP after sleep testing confirm presence of sleep apnea.  If this form of CSME improves dramatically over the coming weeks  Thus apparent CSME today will not be treated as we await the commencement of CPAP use nightly planned in 1 week  We will schedule the patient to return 1 week after the use of CPAP as begun  Moderate nonproliferative diabetic retinopathy of right eye with macular edema (HCC) OD perifoveal CME on the temporal aspect of the macula, worse at 7-week  post intravitreal Avastin.  This suggest that this is not diabetic macular edema but instead could be MAC-TEL.  I reviewed the pictures with the patient and listed her decision making process of avoiding injection of Avastin today, and return visit here 1 week post commencement of use of nightly CPAP to look for improvement or changes in the macular contour OD      ICD-10-CM   1. Moderate nonproliferative diabetic retinopathy of right eye with macular edema associated with type 2 diabetes mellitus (HCC)  E11.3311 OCT, Retina - OU - Both Eyes  2. Sleep apnea in adult  G47.30   3. Type 2 macular telangiectasis of right eye  H35.071     1.  Plan injection today for "CSME" of the right eye will be delayed as the patient is now finally going to commence with treatment of obstructive sleep apnea with CPAP in the coming week.  I reviewed the findings and the considerations with the patient and she agrees that we will await the commencement of CPAP use nightly, and have her return to see [place] here in 1 week after successful use of  nightly CPAP at that time we will repeat the exam and the OCT evaluation.  If the perifoveal CME improves that would confirm this is in fact macular telangiectasis and not CSME of diabetic eye disease and that CPAP itself is the treatment of choice  2.  If the CSME is dramatically worse or if other findings develop, we could always  opt to restart the use of Avastin at that time  3.  Ophthalmic Meds Ordered this visit:  No orders of the defined types were placed in this encounter.      Return in about 2 weeks (around 05/18/2020) for dilate, OD, OCT.  There are no Patient Instructions on file for this visit.   Explained the diagnoses, plan, and follow up with the patient and they expressed understanding.  Patient expressed understanding of the importance of proper follow up care.   Clent Demark Elenna Spratling M.D. Diseases & Surgery of the Retina and Vitreous [facility]ina [facility]abetic [place] 05/04/20     Abbreviations: M myopia (nearsighted); A astigmatism; H hyperopia (farsighted); P presbyopia; Mrx spectacle prescription;  CTL contact lenses; OD right eye; OS left eye; OU both eyes  XT exotropia; ET esotropia; PEK punctate epithelial keratitis; PEE punctate epithelial erosions; DES dry eye syndrome; MGD meibomian gland dysfunction; ATs artificial tears; PFAT's preservative free artificial tears; [place] nuclear sclerotic cataract; PSC posterior subcapsular cataract; ERM epi-retinal membrane; PVD posterior vitreous detachment; RD retinal detachment; DM diabetes mellitus; DR diabetic retinopathy; NPDR non-proliferative diabetic retinopathy; PDR proliferative diabetic retinopathy; CSME clinically significant macular edema; DME diabetic macular edema; dbh dot blot hemorrhages; CWS cotton wool spot; POAG primary open angle glaucoma; C/D cup-to-disc ratio; HVF humphrey visual field; GVF goldmann visual field; OCT optical coherence tomography; IOP intraocular pressure; BRVO Branch retinal vein occlusion; CRVO central retinal vein occlusion; CRAO central retinal artery occlusion; BRAO branch retinal artery occlusion; RT retinal tear; SB scleral buckle; PPV pars plana vitrectomy; VH Vitreous hemorrhage; PRP panretinal laser photocoagulation; IVK intravitreal kenalog; VMT vitreomacular traction; MH Macular hole;  NVD neovascularization of the disc;  NVE neovascularization elsewhere; AREDS age related eye disease study; ARMD age related macular degeneration; POAG primary open angle glaucoma; EBMD epithelial/anterior basement membrane dystrophy; ACIOL anterior chamber intraocular lens; IOL intraocular lens; PCIOL posterior chamber intraocular lens; Phaco/IOL phacoemulsification with intraocular lens placement; [place] photorefractive keratectomy; LASIK laser assisted in situ keratomileusis; HTN hypertension; DM diabetes mellitus; COPD chronic obstructive pulmonary disease

## 2020-05-06 ENCOUNTER — Other Ambulatory Visit: Payer: Self-pay

## 2020-05-06 NOTE — Patient Outreach (Signed)
  [facility]) Care Management Chronic Special Needs Program    05/06/2020  Name: Shannon [name], DOB: 10-Nov-1955  MRN: [id number]   [name] is enrolled in a chronic special needs plan for Diabetes. [facility] Care Management will continue to provide services for this member through 06/24/20.  The [facility] care management team will assume care 06/25/2020.   George Ina RN,BSN,CCM Chronic Care Management Coordinator [facility] Care Management [phone]

## 2020-05-12 ENCOUNTER — Ambulatory Visit: Payer: Self-pay | Admitting: Neurology

## 2020-05-13 ENCOUNTER — Telehealth: Payer: Self-pay

## 2020-05-13 ENCOUNTER — Other Ambulatory Visit: Payer: Self-pay | Admitting: Nurse Practitioner

## 2020-05-13 NOTE — Progress Notes (Signed)
Chronic Care Management Pharmacy Assistant   Name: Saraih Lorton  MRN: [id number] DOB: Nov 21, 1955  Reason for Encounter: Medication Review/Monthly Dispensing Call.   PCP : [name], FNP  Allergies:  No Known Allergies  Medications: Outpatient Encounter Medications as of 05/13/2020  Medication Sig  . atorvastatin (LIPITOR) 20 MG tablet Take 1 tablet (20 mg total) by mouth daily. Hold for next 2 weeks  . blood glucose meter kit and supplies KIT Dispense based on patient and insurance preference. Use 3 times daily as directed to check blood sugar. Dx code e11.65  . Blood Glucose Monitoring Suppl (ONE TOUCH ULTRA MINI) w/Device KIT 1 each by Does not apply route 4 (four) times daily -  before meals and at bedtime.  . Cholecalciferol (VITAMIN D-3 PO) Take 1 capsule by mouth daily. 2000 units  . Continuous Blood Gluc Receiver (FREESTYLE LIBRE 14 DAY READER) DEVI USE TO CHECK BLOOD SUGARS FOUR TIMES A DAY DX:E11.65  . Continuous Blood Gluc Sensor (FREESTYLE LIBRE 14 DAY SENSOR) MISC USE TO CHECK BLOOD SUGARS FOUR TIMES A DAY DX:E11.65  . glucose blood test strip Use as instructed  . glucose blood test strip Use as instructed to check blood sugar 3 times a day. Dx code e11.65  . insulin degludec (TRESIBA FLEXTOUCH) 100 UNIT/ML FlexTouch Pen Inject 0.21 mLs (21 Units total) into the skin daily.  . Lancets (ONETOUCH ULTRASOFT) lancets Use as instructed to check blood sugars 4 times daily E11.9  . lisinopril (ZESTRIL) 10 MG tablet Take 1 tablet (10 mg total) by mouth daily.  . metFORMIN (GLUCOPHAGE XR) 750 MG 24 hr tablet Take 1 tablet (750 mg total) by mouth daily with breakfast.  . Semaglutide,0.25 or 0.5MG/DOS, (OZEMPIC, 0.25 OR 0.5 MG/DOSE,) 2 MG/1.5ML SOPN Inject 0.5 mg into the skin once a week.   No facility-administered encounter medications on file as of 05/13/2020.    Current Diagnosis: Patient Active Problem List   Diagnosis Date Noted  . Type 2 macular  telangiectasis of right eye 05/04/2020  . Sleep apnea in adult 03/16/2020  . Vitreous floaters of left eye 12/29/2019  . Other long term (current) drug therapy 11/16/2019  . Snores 10/27/2019  . Moderate nonproliferative diabetic retinopathy of right eye with macular edema ([place]) 10/06/2019  . Moderate nonproliferative diabetic retinopathy of left eye with macular edema associated with type 2 diabetes mellitus ([place]) 10/06/2019  . Retinal exudates and deposits 10/06/2019  . Cough 11/20/2018  . Other fatigue 11/20/2018  . COVID-19 virus infection 10/13/2018  . Hypokalemia 10/13/2018  . Pyelonephritis 06/17/2018     Follow-Up:  Pharmacist Review   Reviewed chart for medication changes ahead of medication coordination call.  OVs, Consults, or hospital visits since last care coordination call/Pharmacist visit.  05/04/20 Ophthalmology [facility] Severin [place] No medication changes indicated   BP Readings from Last 3 Encounters:  03/22/20 132/70  02/16/20 130/70  01/06/20 112/60    Lab Results  Component Value Date   HGBA1C 7.5 (H) 02/25/2020     Patient obtains medications through Adherence Packaging  90 Days   Last adherence delivery included: (medication name and frequency)  None ID  Patient declined (meds) last month due to PRN use/additional supply on hand. Metformin 750 mg 1 tablet daily(breakfast)-adequate supply  Atorvastatin 20 mg 1 tablet daily(bedtime)-adequate supply  Lisinopril 10 mg 1 tablet daily(breakfast)-adequate supply Tresiba Flextouch 21 units sq daily(breakfast)-adequate supply Onetouch ultra test strip and lancets to check blood sugars three times a day (before breakfast, lunch,  evening meal)-adequate supply  Patient is due for next adherence delivery on: No Fill Needed. Called patient and reviewed medications and coordinated delivery.  This delivery to include:None ID  Patient declined the following medications (meds) due to (reason)  Metformin 750 mg 1 tablet  daily(breakfast)-adequate supply   Atorvastatin 20 mg 1 tablet daily(bedtime)-adequate supply   Lisinopril 10 mg 1 tablet daily(breakfast)-adequate supply  Tresiba Flextouch 21 units sq daily(breakfast)-adequate supply  Onetouch ultra test strip and lancets to check blood sugars three times a day (before breakfast, lunch, evening meal)-adequate supply  Patient needs refills for None ID.  Confirmed delivery date of No Fill Needed, advised patient that pharmacy will contact them the morning of delivery.  [place] Pharmacist Assistant [phone]

## 2020-05-17 DIAGNOSIS — G4733 Obstructive sleep apnea (adult) (pediatric): Secondary | ICD-10-CM | POA: Diagnosis not present

## 2020-05-23 ENCOUNTER — Ambulatory Visit (INDEPENDENT_AMBULATORY_CARE_PROVIDER_SITE_OTHER): Payer: HMO | Admitting: Nurse Practitioner

## 2020-05-23 ENCOUNTER — Other Ambulatory Visit: Payer: Self-pay

## 2020-05-23 ENCOUNTER — Encounter: Payer: Self-pay | Admitting: Nurse Practitioner

## 2020-05-23 VITALS — BP 126/60 | HR 70 | Temp 97.8°F | Ht 62.4 in | Wt 151.2 lb

## 2020-05-23 DIAGNOSIS — Z794 Long term (current) use of insulin: Secondary | ICD-10-CM | POA: Diagnosis not present

## 2020-05-23 DIAGNOSIS — Z6827 Body mass index (BMI) 27.0-27.9, adult: Secondary | ICD-10-CM | POA: Diagnosis not present

## 2020-05-23 DIAGNOSIS — E1165 Type 2 diabetes mellitus with hyperglycemia: Secondary | ICD-10-CM | POA: Diagnosis not present

## 2020-05-23 DIAGNOSIS — Z9989 Dependence on other enabling machines and devices: Secondary | ICD-10-CM | POA: Diagnosis not present

## 2020-05-23 DIAGNOSIS — E782 Mixed hyperlipidemia: Secondary | ICD-10-CM

## 2020-05-23 DIAGNOSIS — I700 Atherosclerosis of aorta: Secondary | ICD-10-CM

## 2020-05-23 DIAGNOSIS — E119 Type 2 diabetes mellitus without complications: Secondary | ICD-10-CM | POA: Diagnosis not present

## 2020-05-23 DIAGNOSIS — E1169 Type 2 diabetes mellitus with other specified complication: Secondary | ICD-10-CM

## 2020-05-23 DIAGNOSIS — G4730 Sleep apnea, unspecified: Secondary | ICD-10-CM

## 2020-05-23 MED ORDER — ATORVASTATIN CALCIUM 20 MG PO TABS: 20.0000 mg | ORAL_TABLET | Freq: Every day | ORAL | 1 refills | Status: DC

## 2020-05-23 NOTE — Patient Instructions (Signed)
Diabetes Mellitus and Exercise Exercising regularly is important for your overall health, especially when you have diabetes (diabetes mellitus). Exercising is not only about losing weight. It has many other health benefits, such as increasing muscle strength and bone density and reducing body fat and stress. This leads to improved fitness, flexibility, and endurance, all of which result in better overall health. Exercise has additional benefits for people with diabetes, including:  Reducing appetite.  Helping to lower and control blood glucose.  Lowering blood pressure.  Helping to control amounts of fatty substances (lipids) in the blood, such as cholesterol and triglycerides.  Helping the body to respond better to insulin (improving insulin sensitivity).  Reducing how much insulin the body needs.  Decreasing the risk for heart disease by: ? Lowering cholesterol and triglyceride levels. ? Increasing the levels of good cholesterol. ? Lowering blood glucose levels. What is my activity plan? Your health care provider or certified diabetes educator can help you make a plan for the type and frequency of exercise (activity plan) that works for you. Make sure that you:  Do at least 150 minutes of moderate-intensity or vigorous-intensity exercise each week. This could be brisk walking, biking, or water aerobics. ? Do stretching and strength exercises, such as yoga or weightlifting, at least 2 times a week. ? Spread out your activity over at least 3 days of the week.  Get some form of physical activity every day. ? Do not go more than 2 days in a row without some kind of physical activity. ? Avoid being inactive for more than 30 minutes at a time. Take frequent breaks to walk or stretch.  Choose a type of exercise or activity that you enjoy, and set realistic goals.  Start slowly, and gradually increase the intensity of your exercise over time. What do I need to know about managing my  diabetes?   Check your blood glucose before and after exercising. ? If your blood glucose is 240 mg/dL (13.3 mmol/L) or higher before you exercise, check your urine for ketones. If you have ketones in your urine, do not exercise until your blood glucose returns to normal. ? If your blood glucose is 100 mg/dL (5.6 mmol/L) or lower, eat a snack containing 15-20 grams of carbohydrate. Check your blood glucose 15 minutes after the snack to make sure that your level is above 100 mg/dL (5.6 mmol/L) before you start your exercise.  Know the symptoms of low blood glucose (hypoglycemia) and how to treat it. Your risk for hypoglycemia increases during and after exercise. Common symptoms of hypoglycemia can include: ? Hunger. ? Anxiety. ? Sweating and feeling clammy. ? Confusion. ? Dizziness or feeling light-headed. ? Increased heart rate or palpitations. ? Blurry vision. ? Tingling or numbness around the mouth, lips, or tongue. ? Tremors or shakes. ? Irritability.  Keep a rapid-acting carbohydrate snack available before, during, and after exercise to help prevent or treat hypoglycemia.  Avoid injecting insulin into areas of the body that are going to be exercised. For example, avoid injecting insulin into: ? The arms, when playing tennis. ? The legs, when jogging.  Keep records of your exercise habits. Doing this can help you and your health care provider adjust your diabetes management plan as needed. Write down: ? Food that you eat before and after you exercise. ? Blood glucose levels before and after you exercise. ? The type and amount of exercise you have done. ? When your insulin is expected to peak, if you use   insulin. Avoid exercising at times when your insulin is peaking.  When you start a new exercise or activity, work with your health care provider to make sure the activity is safe for you, and to adjust your insulin, medicines, or food intake as needed.  Drink plenty of water while  you exercise to prevent dehydration or heat stroke. Drink enough fluid to keep your urine clear or pale yellow. Summary  Exercising regularly is important for your overall health, especially when you have diabetes (diabetes mellitus).  Exercising has many health benefits, such as increasing muscle strength and bone density and reducing body fat and stress.  Your health care provider or certified diabetes educator can help you make a plan for the type and frequency of exercise (activity plan) that works for you.  When you start a new exercise or activity, work with your health care provider to make sure the activity is safe for you, and to adjust your insulin, medicines, or food intake as needed. This information is not intended to replace advice given to you by your health care provider. Make sure you discuss any questions you have with your health care provider. Document Revised: 01/03/2017 Document Reviewed: 11/21/2015 Elsevier Patient Education  2020 [employer]  

## 2020-05-23 NOTE — Progress Notes (Signed)
Shannon [name] as a scribe for Shannon Brine, FNP.,have documented all relevant documentation on the behalf of Shannon Brine, FNP,as directed by  Shannon Brine, FNP while in the presence of Shannon [name], Shannon [name].  This visit occurred during the SARS-CoV-2 public health emergency.  Safety protocols were in place, including screening questions prior to the visit, additional usage of staff PPE, and extensive cleaning of exam room while observing appropriate contact time as indicated for disinfecting solutions.  Subjective:     Patient ID: Shannon [name] , female    DOB: 03-10-1956 , 64 y.o.   MRN: [id number]   Chief Complaint  Patient presents with  . Diabetes    HPI  She is here for her 3 month diabetes follow up. She is now using a freestyle Shannon [name] and is pleased with how it is working. She has it set a certain perimeter an when it goes off she will check her blood sugar.    Tresiba 21 units and Ozempic 0.5 mg  Wt Readings from Last 3 Encounters: 05/23/20 : 151 lb 3.2 oz (68.6 kg) 03/22/20 : 154 lb 6.4 oz (70 kg) 02/16/20 : 154 lb 6.4 oz (70 kg)  She has started on CPAP(Autopap) for the last 7 days; she is to follow up with Dr. Rexene [name].  She will follow up with Dr. Zadie [name] next week to see if the pressure to her eyes improve after starting CPAP.    Diabetes She presents for her follow-up diabetic visit. She has type 2 diabetes mellitus. Pertinent negatives for hypoglycemia include no dizziness or headaches. Pertinent negatives for diabetes include no chest pain, no fatigue, no polydipsia, no polyphagia and no polyuria. She is following a generally healthy diet. When asked about meal planning, she reported none. She has not had a previous visit with a dietitian. She rarely participates in exercise. (63-276 blood sugar ranges, elevated when she eats) An ACE inhibitor/angiotensin II receptor blocker is being taken. She does not see a podiatrist.Eye exam is current.     Past Medical  History:  Diagnosis Date  . Diabetes mellitus without complication ([place])   . Hypercholesteremia   . Hypertension   . Sleep apnea    Newly diagnosed in September 2021     Family History  Problem Relation Age of Onset  . Hypertension Mother   . Diabetes Mother   . Stroke Mother   . Diabetes Father   . Hypertension Father   . Stroke Father   . Hypertension Son   . Diabetes Son   . Breast cancer Neg Hx      Current Outpatient Medications:  .  atorvastatin (LIPITOR) 20 MG tablet, Take 1 tablet (20 mg total) by mouth daily., Disp: 90 tablet, Rfl: 1 .  blood glucose meter kit and supplies KIT, Dispense based on patient and insurance preference. Use 3 times daily as directed to check blood sugar. Dx code e11.65, Disp: 1 each, Rfl: 9 .  Blood Glucose Monitoring Suppl (ONE TOUCH ULTRA MINI) w/Device KIT, 1 each by Does not apply route 4 (four) times daily -  before meals and at bedtime., Disp: 1 kit, Rfl: 0 .  Cholecalciferol (VITAMIN D-3 PO), Take 1 capsule by mouth daily. 2000 units, Disp: , Rfl:  .  Continuous Blood Gluc Receiver (FREESTYLE LIBRE 14 DAY READER) DEVI, USE TO CHECK BLOOD SUGARS FOUR TIMES A DAY DX:E11.65, Disp: 1 each, Rfl: 1 .  Continuous Blood Gluc Sensor (FREESTYLE LIBRE 2 SENSOR) MISC, USE AS DIRECTED EVERY  14 DAYS, Disp: 3 each, Rfl: 1 .  glucose blood test strip, Use as instructed, Disp: 100 each, Rfl: 5 .  glucose blood test strip, Use as instructed to check blood sugar 3 times a day. Dx code e11.65, Disp: 100 each, Rfl: 12 .  insulin degludec (TRESIBA FLEXTOUCH) 100 UNIT/ML FlexTouch Pen, Inject 0.21 mLs (21 Units total) into the skin daily., Disp: 15 pen, Rfl: 1 .  Lancets (ONETOUCH ULTRASOFT) lancets, Use as instructed to check blood sugars 4 times daily E11.9, Disp: 300 each, Rfl: 3 .  lisinopril (ZESTRIL) 10 MG tablet, Take 1 tablet (10 mg total) by mouth daily., Disp: 90 tablet, Rfl: 0 .  metFORMIN (GLUCOPHAGE XR) 750 MG 24 hr tablet, Take 1 tablet (750 mg  total) by mouth daily with breakfast., Disp: 90 tablet, Rfl: 0 .  Semaglutide,0.25 or 0.5MG /DOS, (OZEMPIC, 0.25 OR 0.5 MG/DOSE,) 2 MG/1.5ML SOPN, Inject 0.5 mg into the skin once a week., Disp: 9.5 mL, Rfl: 1   No Known Allergies   Review of Systems  Constitutional: Negative.  Negative for fatigue.  HENT: Negative.   Eyes: Negative.   Respiratory: Negative.   Cardiovascular: Negative.  Negative for chest pain, palpitations and leg swelling.  Endocrine: Negative for polydipsia, polyphagia and polyuria.  Musculoskeletal: Negative.   Skin: Negative.   Neurological: Negative for dizziness and headaches.  Psychiatric/Behavioral: Negative.      Today's Vitals   05/23/20 0927  BP: 126/60  Pulse: 70  Temp: 97.8 F (36.6 C)  TempSrc: Oral  Weight: 151 lb 3.2 oz (68.6 kg)  Height: 5' 2.4" (1.585 m)  PainSc: 0-No pain   Body mass index is 27.3 kg/m.   Objective:  Physical Exam Vitals reviewed.  Constitutional:      General: She is not in acute distress.    Appearance: Normal appearance. She is well-developed.  Cardiovascular:     Rate and Rhythm: Normal rate and regular rhythm.     Pulses: Normal pulses.     Heart sounds: Normal heart sounds. No murmur heard.   Pulmonary:     Effort: Pulmonary effort is normal. No respiratory distress.     Breath sounds: Normal breath sounds. No wheezing.  Chest:     Chest wall: No tenderness.  Musculoskeletal:        General: Normal range of motion.  Skin:    General: Skin is warm and dry.     Capillary Refill: Capillary refill takes less than 2 seconds.  Neurological:     General: No focal deficit present.     Mental Status: She is alert and oriented to person, place, and time.  Psychiatric:        Mood and Affect: Mood normal.        Behavior: Behavior normal.        Thought Content: Thought content normal.        Judgment: Judgment normal.         Assessment And Plan:     1. Type 2 diabetes mellitus without complication,  with long-term current use of insulin (HCC)  Chronic, improved at last visit  Continue with current medications pending HgbA1c  Continue with using freestyle libre - Hemoglobin A1c - CMP14+EGFR - atorvastatin (LIPITOR) 20 MG tablet; Take 1 tablet (20 mg total) by mouth daily.  Dispense: 90 tablet; Refill: 1  2. Aortic atherosclerosis (HCC)  Chronic, she is to continue with atorvastatin, tolerating well  3. Sleep apnea in adult  She has a new diagnosis  and has been using CPAP for the last 7 days  4. CPAP (continuous positive airway pressure) dependence  This is new for her, she is to follow up with Dr. Rexene [name] on 06/09/2020  So far is doing well  5. Mixed hyperlipidemia  Chronic, improved at last visit  Continue with current medications  Will check lipids and liver functions - Lipid panel  6. BMI 27.0-27.9,adult  She is encouraged to strive for BMI less than 25. Advised to aim for at least 150 minutes of exercise per week.  She has lost 3 lbs since her last visit   Wt Readings from Last 3 Encounters:  05/23/20 151 lb 3.2 oz (68.6 kg)  03/22/20 154 lb 6.4 oz (70 kg)  02/16/20 154 lb 6.4 oz (70 kg)    Patient was given opportunity to ask questions. Patient verbalized understanding of the plan and was able to repeat key elements of the plan. All questions were answered to their satisfaction.    Teola Bradley, FNP, have reviewed all documentation for this visit. The documentation on 05/23/20 for the exam, diagnosis, procedures, and orders are all accurate and complete.   THE PATIENT IS ENCOURAGED TO PRACTICE SOCIAL DISTANCING DUE TO THE COVID-19 PANDEMIC.

## 2020-05-24 ENCOUNTER — Encounter (INDEPENDENT_AMBULATORY_CARE_PROVIDER_SITE_OTHER): Payer: HMO | Admitting: Ophthalmology

## 2020-05-24 LAB — CMP14+EGFR
ALT: 21 IU/L (ref 0–32)
AST: 12 IU/L (ref 0–40)
Albumin/Globulin Ratio: 1.9 (ref 1.2–2.2)
Albumin: 4.4 g/dL (ref 3.8–4.8)
Alkaline Phosphatase: 80 IU/L (ref 44–121)
BUN/Creatinine Ratio: 29 — ABNORMAL HIGH (ref 12–28)
BUN: 19 mg/dL (ref 8–27)
Bilirubin Total: 0.3 mg/dL (ref 0.0–1.2)
CO2: 22 mmol/L (ref 20–29)
Calcium: 9.6 mg/dL (ref 8.7–10.3)
Chloride: 104 mmol/L (ref 96–106)
Creatinine, Ser: 0.66 mg/dL (ref 0.57–1.00)
GFR calc Af Amer: 107 mL/min/{1.73_m2} (ref >59)
GFR calc non Af Amer: 93 mL/min/{1.73_m2} (ref >59)
Globulin, Total: 2.3 g/dL (ref 1.5–4.5)
Glucose: 101 mg/dL — ABNORMAL HIGH (ref 65–99)
Potassium: 4 mmol/L (ref 3.5–5.2)
Sodium: 142 mmol/L (ref 134–144)
Total Protein: 6.7 g/dL (ref 6.0–8.5)

## 2020-05-24 LAB — LIPID PANEL
Chol/HDL Ratio: 2.4 ratio (ref 0.0–4.4)
Cholesterol, Total: 119 mg/dL (ref 100–199)
HDL: 50 mg/dL (ref >39)
LDL Chol Calc (NIH): 50 mg/dL (ref 0–99)
Triglycerides: 104 mg/dL (ref 0–149)
VLDL Cholesterol Cal: 19 mg/dL (ref 5–40)

## 2020-05-24 LAB — HEMOGLOBIN A1C
Est. average glucose Bld gHb Est-mCnc: 146 mg/dL
Hgb A1c MFr Bld: 6.7 % — ABNORMAL HIGH (ref 4.8–5.6)

## 2020-05-25 ENCOUNTER — Other Ambulatory Visit: Payer: Self-pay

## 2020-05-25 ENCOUNTER — Ambulatory Visit (INDEPENDENT_AMBULATORY_CARE_PROVIDER_SITE_OTHER): Payer: HMO | Admitting: Ophthalmology

## 2020-05-25 ENCOUNTER — Encounter (INDEPENDENT_AMBULATORY_CARE_PROVIDER_SITE_OTHER): Payer: Self-pay | Admitting: Ophthalmology

## 2020-05-25 DIAGNOSIS — E113311 Type 2 diabetes mellitus with moderate nonproliferative diabetic retinopathy with macular edema, right eye: Secondary | ICD-10-CM | POA: Diagnosis not present

## 2020-05-25 DIAGNOSIS — E113312 Type 2 diabetes mellitus with moderate nonproliferative diabetic retinopathy with macular edema, left eye: Secondary | ICD-10-CM

## 2020-05-25 DIAGNOSIS — H35071 Retinal telangiectasis, right eye: Secondary | ICD-10-CM | POA: Diagnosis not present

## 2020-05-25 NOTE — Assessment & Plan Note (Signed)
Improved as compared to 3 weeks previous, at no therapy at that time.  (Now 10 weeks post Avastin)  Thus improved CSME may have occurred as nightly hypoxemia from sleep apnea is improved on the last 2 weeks of CPAP

## 2020-05-25 NOTE — Assessment & Plan Note (Signed)
Stable, will observe.

## 2020-05-25 NOTE — Progress Notes (Signed)
05/25/2020     CHIEF COMPLAINT Patient presents for Retina Follow Up   HISTORY OF PRESENT ILLNESS: Shannon [name] is a 64 y.o. female who presents to the clinic today for:   HPI    Retina Follow Up    Patient presents with  Diabetic Retinopathy.  In right eye.  This started 3 weeks ago.  Severity is mild.  Duration of 3 weeks.  Since onset it is stable.          Comments    3 Week F/U OD  Pt denies noticeable changes to [place] OU since last visit. Pt denies ocular pain, flashes of light, or floaters OU.  LBS: 143 last night  Patient started CPAP use nightly some 2 weeks previously, May 12, 2020.  She does  report an improved sense of wellbeing and improved energy the next day       Last edited by [name], MD on 05/25/2020 10:07 AM. (History)      Referring physician: [name], [address] [place],  [place] [address]  HISTORICAL INFORMATION:   Selected notes from the [facility]    Lab Results  Component Value Date   HGBA1C 6.7 (H) 05/23/2020     CURRENT MEDICATIONS: No current outpatient medications on file. (Ophthalmic Drugs)   No current facility-administered medications for this visit. (Ophthalmic Drugs)   Current Outpatient Medications (Other)  Medication Sig  . atorvastatin (LIPITOR) 20 MG tablet Take 1 tablet (20 mg total) by mouth daily.  . blood glucose meter kit and supplies KIT Dispense based on patient and insurance preference. Use 3 times daily as directed to check blood sugar. Dx code e11.65  . Blood Glucose Monitoring Suppl (ONE TOUCH ULTRA MINI) w/Device KIT 1 each by Does not apply route 4 (four) times daily -  before meals and at bedtime.  . Cholecalciferol (VITAMIN D-3 PO) Take 1 capsule by mouth daily. 2000 units  . Continuous Blood Gluc Receiver (FREESTYLE LIBRE 14 DAY READER) DEVI USE TO CHECK BLOOD SUGARS FOUR TIMES A DAY DX:E11.65  . Continuous Blood Gluc Sensor (FREESTYLE LIBRE 2 SENSOR)  MISC USE AS DIRECTED EVERY 14 DAYS  . glucose blood test strip Use as instructed  . glucose blood test strip Use as instructed to check blood sugar 3 times a day. Dx code e11.65  . insulin degludec (TRESIBA FLEXTOUCH) 100 UNIT/ML FlexTouch Pen Inject 0.21 mLs (21 Units total) into the skin daily.  . Lancets (ONETOUCH ULTRASOFT) lancets Use as instructed to check blood sugars 4 times daily E11.9  . lisinopril (ZESTRIL) 10 MG tablet Take 1 tablet (10 mg total) by mouth daily.  . metFORMIN (GLUCOPHAGE XR) 750 MG 24 hr tablet Take 1 tablet (750 mg total) by mouth daily with breakfast.  . Semaglutide,0.25 or 0.5MG/DOS, (OZEMPIC, 0.25 OR 0.5 MG/DOSE,) 2 MG/1.5ML SOPN Inject 0.5 mg into the skin once a week.   No current facility-administered medications for this visit. (Other)      REVIEW OF SYSTEMS:    ALLERGIES No Known Allergies  PAST MEDICAL HISTORY Past Medical History:  Diagnosis Date  . Diabetes mellitus without complication ([place])   . Hypercholesteremia   . Hypertension   . Sleep apnea    Newly diagnosed in September 2021   Past Surgical History:  Procedure Laterality Date  . ABDOMINAL HYSTERECTOMY    . CATARACT EXTRACTION Bilateral   . CESAREAN SECTION    . MYOMECTOMY  FAMILY HISTORY Family History  Problem Relation Age of Onset  . Hypertension Mother   . Diabetes Mother   . Stroke Mother   . Diabetes Father   . Hypertension Father   . Stroke Father   . Hypertension Son   . Diabetes Son   . Breast cancer Neg Hx     SOCIAL HISTORY Social History   Tobacco Use  . Smoking status: Former Smoker    Quit date: 2006    Years since quitting: 15.9  . Smokeless tobacco: Never Used  . Tobacco comment: quit 10 yrs  Vaping Use  . Vaping Use: Never used  Substance Use Topics  . Alcohol use: Yes    Comment: 1 glass of wine sometimes daily   . Drug use: Never         OPHTHALMIC EXAM: Base Eye Exam    Visual Acuity (ETDRS)      Right Left   Dist [place]  20/25 +2 20/25 +2       Tonometry (Tonopen, 9:22 AM)      Right Left   Pressure 14 16       Pupils      Pupils Dark Light Shape React APD   Right PERRL 3 2 Round Brisk None   Left PERRL 3 2 Round Brisk None       Visual Fields (Counting fingers)      Left Right    Full Full       Extraocular Movement      Right Left    Full Full       Neuro/Psych    Oriented x3: Yes   Mood/Affect: Normal       Dilation    Right eye: 1.0% Mydriacyl, 2.5% Phenylephrine @ 9:26 AM        Slit Lamp and Fundus Exam    External Exam      Right Left   External Normal Normal       Slit Lamp Exam      Right Left   Lids/Lashes Normal Normal   Conjunctiva/Sclera White and quiet White and quiet   Cornea Clear Clear   Anterior Chamber Deep and quiet Deep and quiet   Iris Round and reactive Round and reactive   Lens Posterior chamber intraocular lens Posterior chamber intraocular lens   Anterior Vitreous Normal Normal       Fundus Exam      Right Left   Posterior Vitreous Normal    Disc Normal    C/D Ratio 0.2    Macula Clinically significant macular edema, Exudates, Macular thickening, Mild clinically significant macular edema    Vessels NPDR- Moderate    Periphery Normal           IMAGING AND PROCEDURES  Imaging and Procedures for 05/25/20  OCT, Retina - OU - Both Eyes       Right Eye Quality was good. Scan locations included subfoveal. Central Foveal Thickness: 315. Progression has improved.   Left Eye Quality was good. Scan locations included subfoveal. Central Foveal Thickness: 298. Progression has been stable.   Notes Much less retinal thickening OD now 10 weeks post Avastin, but only 3 weeks after last visit, where CSME temporal OD was much worse at 6-week interval post therapy, and no therapy delivered 3 weeks ago  Thus the retinal thickening OD today could be ascribed to her recent, 2 weeks prior, institution of use of nightly CPAP  OS with no interval  change in  macular thickening                ASSESSMENT/PLAN:  Type 2 macular telangiectasis of right eye Much less retinal thickening OD now 10 weeks post Avastin, but only 3 weeks after last visit, at which time the CSME temporal OD was much worse at 6-week interval post Avastin therapy, and no therapy delivered 3 weeks ago  Thus the retinal thickening OD today could be ascribed to her recent, 2 weeks prior, institution of use of nightly CPAP  Moderate nonproliferative diabetic retinopathy of left eye with macular edema associated with type 2 diabetes mellitus (HCC) Stable, will observe  Moderate nonproliferative diabetic retinopathy of right eye with macular edema (HCC) Improved as compared to 3 weeks previous, at no therapy at that time.  (Now 10 weeks post Avastin)  Thus improved CSME may have occurred as nightly hypoxemia from sleep apnea is improved on the last 2 weeks of CPAP      ICD-10-CM   1. Moderate nonproliferative diabetic retinopathy of right eye with macular edema associated with type 2 diabetes mellitus (HCC)  E11.3311 OCT, Retina - OU - Both Eyes  2. Type 2 macular telangiectasis of right eye  H35.071   3. Moderate nonproliferative diabetic retinopathy of left eye with macular edema associated with type 2 diabetes mellitus ([place])  G62.6948     1.  Continue benefits and all attempts for compliance with CPAP usage were reviewed with Ms. Koslosky, she does report having some difficulty maintaining the CPAP mask on for more than 2 hours intervals at night, she is making adjustments starting today, after discussion with supporting  2.  We will continue to observe CSME OU, minor OS and improved OD post institution of use of CPAP and off of Avastin therapy OD  3.  Upon return of CSME continues its improvement, this may indeed be definitive evidence that CPAP is preventing progression rapidly of CSME and in fact may be assisting in some of its clearance  I did report to the  patient that this will not reverse diabetic retinopathy but may prevent it from progressing quickly  Ophthalmic Meds Ordered this visit:  No orders of the defined types were placed in this encounter.      Return in about 6 weeks (around 07/06/2020) for DILATE OU, OCT.  There are no Patient Instructions on file for this visit.   Explained the diagnoses, plan, and follow up with the patient and they expressed understanding.  Patient expressed understanding of the importance of proper follow up care.   Clent Demark Nataline Basara M.D. Diseases & Surgery of the Retina and Vitreous [facility] 05/25/20     Abbreviations: M myopia (nearsighted); A astigmatism; H hyperopia (farsighted); P presbyopia; Mrx spectacle prescription;  CTL contact lenses; OD right eye; OS left eye; OU both eyes  XT exotropia; ET esotropia; PEK punctate epithelial keratitis; PEE punctate epithelial erosions; DES dry eye syndrome; MGD meibomian gland dysfunction; ATs artificial tears; PFAT's preservative free artificial tears; [place] nuclear sclerotic cataract; PSC posterior subcapsular cataract; ERM epi-retinal membrane; PVD posterior vitreous detachment; RD retinal detachment; DM diabetes mellitus; DR diabetic retinopathy; NPDR non-proliferative diabetic retinopathy; PDR proliferative diabetic retinopathy; CSME clinically significant macular edema; DME diabetic macular edema; dbh dot blot hemorrhages; CWS cotton wool spot; POAG primary open angle glaucoma; C/D cup-to-disc ratio; HVF humphrey visual field; GVF goldmann visual field; OCT optical coherence tomography; IOP intraocular pressure; BRVO Branch retinal vein occlusion; CRVO central retinal vein occlusion; CRAO  central retinal artery occlusion; BRAO branch retinal artery occlusion; RT retinal tear; SB scleral buckle; PPV pars plana vitrectomy; VH Vitreous hemorrhage; PRP panretinal laser photocoagulation; IVK intravitreal kenalog; VMT vitreomacular traction; MH  Macular hole;  NVD neovascularization of the disc; NVE neovascularization elsewhere; AREDS age related eye disease study; ARMD age related macular degeneration; POAG primary open angle glaucoma; EBMD epithelial/anterior basement membrane dystrophy; ACIOL anterior chamber intraocular lens; IOL intraocular lens; PCIOL posterior chamber intraocular lens; Phaco/IOL phacoemulsification with intraocular lens placement; [place] photorefractive keratectomy; LASIK laser assisted in situ keratomileusis; HTN hypertension; DM diabetes mellitus; COPD chronic obstructive pulmonary disease

## 2020-05-25 NOTE — Assessment & Plan Note (Signed)
Much less retinal thickening OD now 10 weeks post Avastin, but only 3 weeks after last visit, at which time the CSME temporal OD was much worse at 6-week interval post Avastin therapy, and no therapy delivered 3 weeks ago  Thus the retinal thickening OD today could be ascribed to her recent, 2 weeks prior, institution of use of nightly CPAP

## 2020-06-08 ENCOUNTER — Telehealth: Payer: Self-pay

## 2020-06-08 NOTE — Telephone Encounter (Signed)
I called pt. She started cpap on 05/10/2020. Her appt on 06/09/20 is too soon. (Needs to be 31-90 days after starting, this would be right at day 30.) She is also not fully compliant. Pt is agreeable to rescheduling her first cpap follow up and a new appt was made for 07/27/20 at 7:30am. Pt verbalized understanding of new appt date and time.

## 2020-06-09 ENCOUNTER — Ambulatory Visit: Payer: Self-pay | Admitting: Neurology

## 2020-06-09 ENCOUNTER — Telehealth: Payer: Self-pay | Admitting: *Deleted

## 2020-06-09 NOTE — Chronic Care Management (AMB) (Signed)
  Care Management   Note  06/09/2020 Name: [name] MRN: [id number] DOB: 09-14-1955  Shannon [name] is a 64 y.o. year old female who is a primary care patient of Arnette Felts, FNP and is actively engaged with the care management team. I reached out to Carlton Adam by phone today to assist with re-scheduling a follow up visit with the Pharmacist.  Follow up plan: Unsuccessful telephone outreach attempt made. The care management team will reach out to the patient again over the next 7 days. If patient returns call to provider office, please advise to call Embedded Care Management Care Guide Gwenevere Ghazi at [phone].  Gwenevere Ghazi  Care Guide, Embedded Care Coordination [facility] Management  Direct Dial: [phone]

## 2020-06-10 NOTE — Chronic Care Management (AMB) (Signed)
  Care Management   Note  06/10/2020 Name: Shannon [name] MRN: [id number] DOB: [birth year]  Shannon [name] is a 64 y.o. year old female who is a primary care patient of Arnette Felts, FNP and is actively engaged with the care management team. I reached out to Carlton Adam by phone today to assist with re-scheduling a follow up visit with the Pharmacist  Follow up plan: Telephone appointment with care management team member scheduled for:07/26/2020  [facility] Guide, Embedded Care Coordination [facility]  Care Management  Direct Dial: [phone]

## 2020-06-13 ENCOUNTER — Telehealth: Payer: Self-pay

## 2020-06-13 NOTE — Chronic Care Management (AMB) (Signed)
Chronic Care Management Pharmacy Assistant   Name: Noreta Kue  MRN: [id number] DOB: [birth year]  Reason for Encounter: Medication Review  Patient Questions:  1.  Have you seen any other providers since your last visit? Yes, 11/29/2021[name], FNP (PCP); 05/25/2020- Dr Zadie Rhine ([place] Specialist).  2.  Any changes in your medicines or health? Yes, 05/23/2020- Atorvastatin 20 mg restarted daily.   PCP : [name], FNP   Allergies:  No Known Allergies  Medications: Outpatient Encounter Medications as of 06/13/2020  Medication Sig  . atorvastatin (LIPITOR) 20 MG tablet Take 1 tablet (20 mg total) by mouth daily.  . blood glucose meter kit and supplies KIT Dispense based on patient and insurance preference. Use 3 times daily as directed to check blood sugar. Dx code e11.65  . Blood Glucose Monitoring Suppl (ONE TOUCH ULTRA MINI) w/Device KIT 1 each by Does not apply route 4 (four) times daily -  before meals and at bedtime.  . Cholecalciferol (VITAMIN D-3 PO) Take 1 capsule by mouth daily. 2000 units  . Continuous Blood Gluc Receiver (FREESTYLE LIBRE 14 DAY READER) DEVI USE TO CHECK BLOOD SUGARS FOUR TIMES A DAY DX:E11.65  . Continuous Blood Gluc Sensor (FREESTYLE LIBRE 2 SENSOR) MISC USE AS DIRECTED EVERY 14 DAYS  . glucose blood test strip Use as instructed  . glucose blood test strip Use as instructed to check blood sugar 3 times a day. Dx code e11.65  . insulin degludec (TRESIBA FLEXTOUCH) 100 UNIT/ML FlexTouch Pen Inject 0.21 mLs (21 Units total) into the skin daily.  . Lancets (ONETOUCH ULTRASOFT) lancets Use as instructed to check blood sugars 4 times daily E11.9  . lisinopril (ZESTRIL) 10 MG tablet Take 1 tablet (10 mg total) by mouth daily.  . metFORMIN (GLUCOPHAGE XR) 750 MG 24 hr tablet Take 1 tablet (750 mg total) by mouth daily with breakfast.  . Semaglutide,0.25 or 0.5MG/DOS, (OZEMPIC, 0.25 OR 0.5 MG/DOSE,) 2 MG/1.5ML SOPN Inject 0.5 mg into the skin  once a week.   No facility-administered encounter medications on file as of 06/13/2020.    Current Diagnosis: Patient Active Problem List   Diagnosis Date Noted  . Type 2 macular telangiectasis of right eye 05/04/2020  . Sleep apnea in adult 03/16/2020  . Vitreous floaters of left eye 12/29/2019  . Other long term (current) drug therapy 11/16/2019  . Snores 10/27/2019  . Moderate nonproliferative diabetic retinopathy of right eye with macular edema ([place]) 10/06/2019  . Moderate nonproliferative diabetic retinopathy of left eye with macular edema associated with type 2 diabetes mellitus ([place]) 10/06/2019  . Retinal exudates and deposits 10/06/2019  . Cough 11/20/2018  . Other fatigue 11/20/2018  . COVID-19 virus infection 10/13/2018  . Hypokalemia 10/13/2018  . Pyelonephritis 06/17/2018   Reviewed chart for medication changes ahead of medication coordination call.  OVs, Consults-11/29/2021[name], FNP (PCP); 05/25/2020- Dr Zadie Rhine (Retina Specialist) since last care coordination call/Pharmacist visit.  Medication changes indicated:05/23/2020- Atorvastatin 20 mg restarted daily.    BP Readings from Last 3 Encounters:  05/23/20 126/60  03/22/20 132/70  02/16/20 130/70    Lab Results  Component Value Date   HGBA1C 6.7 (H) 05/23/2020     Patient obtains medications through Adherence Packaging  90 Days   Last adherence delivery included: None-  Medications not needed during last adherence all on 05/13/2020.  Patient declined the following medications last month:   Metformin 750 mg 1 tablet daily(breakfast)  Atorvastatin 20 mg 1 tablet daily(bedtime)  Lisinopril 10 mg 1 tablet daily(breakfast) Tyler Aas Flextouch 21 units sq daily(breakfast) Onetouch ultra test strip and lancets to check blood sugars three times a day (before breakfast, lunch, evening meal)   Due to receiving a 90 day supply on 03/22/2020.  Patient is due for next adherence delivery on:  06/20/2020. 06/13/2020- Called patient to review medications and coordinate delivery, no answer, left message to return call. 06/21/2020- Called patient again, no answer, left message to return call.  06/27/2020- Called patient twice, no answer, left message to return call.   06/27/2020- Patient called [facility] to request Atorvastatin, Lisinopril, Metformin and Vitamin D and that she was out of medications.   Coordinated acute fill for Atorvastatin 20 mg, Lisinopril 10 mg, Metformin 750 mg and Vitam D  to be delivered 06/27/2020.   Patient needs refills for Atorvastatin 20 mg, Lisinopril 10 mg, Metformin 750 mg and Vitam D. Request sent to PCP office.   Follow-Up:  Coordination of Enhanced Pharmacy Services and Pharmacist Review  [name], CPP aware of attempted calls and medications needed.  [name], [place] Pharmacist Assistant [phone]

## 2020-06-14 ENCOUNTER — Telehealth: Payer: Self-pay

## 2020-06-16 DIAGNOSIS — G4733 Obstructive sleep apnea (adult) (pediatric): Secondary | ICD-10-CM | POA: Diagnosis not present

## 2020-06-27 ENCOUNTER — Other Ambulatory Visit: Payer: Self-pay

## 2020-06-27 DIAGNOSIS — E119 Type 2 diabetes mellitus without complications: Secondary | ICD-10-CM

## 2020-06-27 DIAGNOSIS — Z794 Long term (current) use of insulin: Secondary | ICD-10-CM

## 2020-06-27 MED ORDER — METFORMIN HCL ER 750 MG PO TB24: 750.0000 mg | ORAL_TABLET | Freq: Every day | ORAL | 0 refills | Status: DC

## 2020-06-27 MED ORDER — LISINOPRIL 10 MG PO TABS: 10.0000 mg | ORAL_TABLET | Freq: Every day | ORAL | 0 refills | Status: DC

## 2020-06-28 ENCOUNTER — Other Ambulatory Visit: Payer: Self-pay

## 2020-07-06 ENCOUNTER — Other Ambulatory Visit: Payer: Self-pay

## 2020-07-06 ENCOUNTER — Ambulatory Visit (INDEPENDENT_AMBULATORY_CARE_PROVIDER_SITE_OTHER): Payer: HMO | Admitting: Ophthalmology

## 2020-07-06 ENCOUNTER — Encounter (INDEPENDENT_AMBULATORY_CARE_PROVIDER_SITE_OTHER): Payer: Self-pay | Admitting: Ophthalmology

## 2020-07-06 DIAGNOSIS — E113311 Type 2 diabetes mellitus with moderate nonproliferative diabetic retinopathy with macular edema, right eye: Secondary | ICD-10-CM | POA: Diagnosis not present

## 2020-07-06 DIAGNOSIS — H35033 Hypertensive retinopathy, bilateral: Secondary | ICD-10-CM | POA: Diagnosis not present

## 2020-07-06 DIAGNOSIS — G4730 Sleep apnea, unspecified: Secondary | ICD-10-CM | POA: Diagnosis not present

## 2020-07-06 DIAGNOSIS — E113312 Type 2 diabetes mellitus with moderate nonproliferative diabetic retinopathy with macular edema, left eye: Secondary | ICD-10-CM | POA: Diagnosis not present

## 2020-07-06 MED ORDER — BEVACIZUMAB 2.5 MG/0.1ML IZ SOSY
2.5000 mg | PREFILLED_SYRINGE | INTRAVITREAL | Status: AC | PRN
  Administered 2020-07-06: 2.5 mg via INTRAVITREAL

## 2020-07-06 NOTE — Progress Notes (Signed)
07/06/2020     CHIEF COMPLAINT Patient presents for Retina Follow Up (6 WK FU OU///Pt reports stable vision OU, no new F/F OU, no pain or pressure OU. ////Last A1C: 6.7 taken 05/2020////Last BS: 95 this AM )   HISTORY OF PRESENT ILLNESS: Shannon [name] is a 65 y.o. female who presents to the clinic today for:   HPI    Retina Follow Up    Patient presents with  Diabetic Retinopathy.  In both eyes.  This started 6 weeks ago.  Severity is mild.  Duration of 6 weeks.  Since onset it is stable. Additional comments: 6 WK FU OU   Pt reports stable vision OU, no new F/F OU, no pain or pressure OU.     Last A1C: 6.7 taken 05/2020    Last BS: 95 this AM        Last edited by Nichola Sizer D on 07/06/2020  9:30 AM. (History)      Referring physician: [name], [address] [place],  [place] [address]  HISTORICAL INFORMATION:   Selected notes from the MEDICAL RECORD NUMBER    Lab Results  Component Value Date   HGBA1C 6.7 (H) 05/23/2020     CURRENT MEDICATIONS: No current outpatient medications on file. (Ophthalmic Drugs)   No current facility-administered medications for this visit. (Ophthalmic Drugs)   Current Outpatient Medications (Other)  Medication Sig  . atorvastatin (LIPITOR) 20 MG tablet Take 1 tablet (20 mg total) by mouth daily.  . blood glucose meter kit and supplies KIT Dispense based on patient and insurance preference. Use 3 times daily as directed to check blood sugar. Dx code e11.65  . Blood Glucose Monitoring Suppl (ONE TOUCH ULTRA MINI) w/Device KIT 1 each by Does not apply route 4 (four) times daily -  before meals and at bedtime.  . Cholecalciferol (VITAMIN D-3 PO) Take 1 capsule by mouth daily. 2000 units  . Continuous Blood Gluc Receiver (FREESTYLE LIBRE 14 DAY READER) DEVI USE TO CHECK BLOOD SUGARS FOUR TIMES A DAY DX:E11.65  . Continuous Blood Gluc Sensor (FREESTYLE LIBRE 2 SENSOR) MISC USE AS DIRECTED EVERY 14 DAYS   . glucose blood test strip Use as instructed  . glucose blood test strip Use as instructed to check blood sugar 3 times a day. Dx code e11.65  . insulin degludec (TRESIBA FLEXTOUCH) 100 UNIT/ML FlexTouch Pen Inject 0.21 mLs (21 Units total) into the skin daily.  . Lancets (ONETOUCH ULTRASOFT) lancets Use as instructed to check blood sugars 4 times daily E11.9  . lisinopril (ZESTRIL) 10 MG tablet Take 1 tablet (10 mg total) by mouth daily.  . metFORMIN (GLUCOPHAGE XR) 750 MG 24 hr tablet Take 1 tablet (750 mg total) by mouth daily with breakfast.  . Semaglutide,0.25 or 0.5MG/DOS, (OZEMPIC, 0.25 OR 0.5 MG/DOSE,) 2 MG/1.5ML SOPN Inject 0.5 mg into the skin once a week.   No current facility-administered medications for this visit. (Other)      REVIEW OF SYSTEMS:    ALLERGIES No Known Allergies  PAST MEDICAL HISTORY Past Medical History:  Diagnosis Date  . Diabetes mellitus without complication ([place])   . Hypercholesteremia   . Hypertension   . Sleep apnea    Newly diagnosed in September 2021   Past Surgical History:  Procedure Laterality Date  . ABDOMINAL HYSTERECTOMY    . CATARACT EXTRACTION Bilateral   . CESAREAN SECTION    . MYOMECTOMY      FAMILY HISTORY Family History  Problem Relation Age of Onset  . Hypertension Mother   . Diabetes Mother   . Stroke Mother   . Diabetes Father   . Hypertension Father   . Stroke Father   . Hypertension Son   . Diabetes Son   . Breast cancer Neg Hx     SOCIAL HISTORY Social History   Tobacco Use  . Smoking status: Former Smoker    Quit date: 2006    Years since quitting: 16.0  . Smokeless tobacco: Never Used  . Tobacco comment: quit 10 yrs  Vaping Use  . Vaping Use: Never used  Substance Use Topics  . Alcohol use: Yes    Comment: 1 glass of wine sometimes daily   . Drug use: Never         OPHTHALMIC EXAM: Base Eye Exam    Visual Acuity (ETDRS)      Right Left   Dist [place] 20/30 +2 20/20 -2   Dist ph [place] 20/20  -1        Tonometry (Tonopen, 9:35 AM)      Right Left   Pressure 13 15       Pupils      Pupils Dark Light Shape React APD   Right PERRL 3 2 Round Brisk None   Left PERRL 3 2 Round Brisk None       Visual Fields (Counting fingers)      Left Right    Full Full       Extraocular Movement      Right Left    Full Full       Neuro/Psych    Oriented x3: Yes   Mood/Affect: Normal       Dilation    Both eyes: 1.0% Mydriacyl, 2.5% Phenylephrine @ 9:35 AM        Slit Lamp and Fundus Exam    External Exam      Right Left   External Normal Normal       Slit Lamp Exam      Right Left   Lids/Lashes Normal Normal   Conjunctiva/Sclera White and quiet White and quiet   Cornea Clear Clear   Anterior Chamber Deep and quiet Deep and quiet   Iris Round and reactive Round and reactive   Lens Posterior chamber intraocular lens Posterior chamber intraocular lens   Anterior Vitreous Normal Normal       Fundus Exam      Right Left   Posterior Vitreous Normal Normal, small vitreous debris, no cells, no overt PVD   Disc Normal Normal, posterior pole cotton-wool spots noted   C/D Ratio 0.2 0.2   Macula Clinically significant macular edema, Exudates, Macular thickening, Mild clinically significant macular edema Microaneurysms, no detectable clinically significant macular edema   Vessels NPDR- Moderate NPDR- Moderate   Periphery Normal Normal          IMAGING AND PROCEDURES  Imaging and Procedures for 07/06/20  OCT, Retina - OU - Both Eyes       Right Eye Quality was good. Scan locations included subfoveal. Central Foveal Thickness: 329. Progression has worsened.   Left Eye Quality was good. Scan locations included subfoveal. Central Foveal Thickness: 301. Progression has been stable. Findings include normal foveal contour.   Notes OD with focal CSME superotemporal margin of the foveal avascular zone slightly worse than last visit December but overall improved as  compared to November 2021.  We will repeat injection intravitreal Avastin OD today, first injection OD  since 03/16/2020       Intravitreal Injection, Pharmacologic Agent - OD - Right Eye       Time Out 07/06/2020. 10:46 AM. Confirmed correct patient, procedure, site, and patient consented.   Anesthesia Topical anesthesia was used. Anesthetic medications included [place] 3.5%.   Procedure Preparation included Ofloxacin , 10% betadine to eyelids, 5% betadine to ocular surface. A supplied needle was used.   Injection:  2.5 mg Bevacizumab (AVASTIN) 2.51m/0.1mL SOSY   NDC: 716384-536-46 Lot:: [id number]  Route: Intravitreal, Site: Right Eye  Post-op Post injection exam found visual acuity of at least counting fingers. The patient tolerated the procedure well. There were no complications. The patient received written and verbal post procedure care education. Post injection medications were not given.                 ASSESSMENT/PLAN:  Moderate nonproliferative diabetic retinopathy of right eye with macular edema (HCC) OD with focal CSME superotemporal margin of the foveal avascular zone slightly worse than last visit December but overall improved as compared to November 2021.  We will repeat injection intravitreal Avastin OD today, first injection OD since 03/16/2020  Sleep apnea in adult Continue with CPAP, as patient has improved sense of wellbeing and rested sensation since commencing with this therapy  Moreover treatment of nighttime hypoxemic and hypertensive events that occur with sleep apnea should assist in resolution of posterior pole hypertensive changes and the findings of the retina  Hypertensive retinopathy of both eyes Continue to monitor  daily  blood pressure, and treat with CPAP to prevent nighttime high hypoxemic induced hypertensive episodes      ICD-10-CM   1. Moderate nonproliferative diabetic retinopathy of right eye with macular edema associated with type 2  diabetes mellitus (HCC)  E11.3311 OCT, Retina - OU - Both Eyes    Intravitreal Injection, Pharmacologic Agent - OD - Right Eye    bevacizumab (AVASTIN) SOSY 2.5 mg  2. Moderate nonproliferative diabetic retinopathy of left eye with macular edema associated with type 2 diabetes mellitus (HCC)  EQ82.5003OCT, Retina - OU - Both Eyes  3. Sleep apnea in adult  G47.30   4. Hypertensive retinopathy of both eyes  H35.033     1.  2.  3.  Ophthalmic Meds Ordered this visit:  Meds ordered this encounter  Medications  . bevacizumab (AVASTIN) SOSY 2.5 mg       Return in about 6 weeks (around 08/17/2020) for dilate, OD, AVASTIN OCT.  There are no Patient Instructions on file for this visit.   Explained the diagnoses, plan, and follow up with the patient and they expressed understanding.  Patient expressed understanding of the importance of proper follow up care.   GClent DemarkRankin M.D. Diseases & Surgery of the Retina and Vitreous Retina & Diabetic [place]01/12/22     Abbreviations: M myopia (nearsighted); A astigmatism; H hyperopia (farsighted); P presbyopia; Mrx spectacle prescription;  CTL contact lenses; OD right eye; OS left eye; OU both eyes  XT exotropia; ET esotropia; PEK punctate epithelial keratitis; PEE punctate epithelial erosions; DES dry eye syndrome; MGD meibomian gland dysfunction; ATs artificial tears; PFAT's preservative free artificial tears; NEutawnuclear sclerotic cataract; PSC posterior subcapsular cataract; ERM epi-retinal membrane; PVD posterior vitreous detachment; RD retinal detachment; DM diabetes mellitus; DR diabetic retinopathy; NPDR non-proliferative diabetic retinopathy; PDR proliferative diabetic retinopathy; CSME clinically significant macular edema; DME diabetic macular edema; dbh dot blot hemorrhages; CWS cotton wool spot; POAG primary open angle glaucoma; C/D  cup-to-disc ratio; HVF humphrey visual field; GVF goldmann visual field; OCT optical coherence  tomography; IOP intraocular pressure; BRVO Branch retinal vein occlusion; CRVO central retinal vein occlusion; CRAO central retinal artery occlusion; BRAO branch retinal artery occlusion; RT retinal tear; SB scleral buckle; PPV pars plana vitrectomy; VH Vitreous hemorrhage; PRP panretinal laser photocoagulation; IVK intravitreal kenalog; VMT vitreomacular traction; MH Macular hole;  NVD neovascularization of the disc; NVE neovascularization elsewhere; AREDS age related eye disease study; ARMD age related macular degeneration; POAG primary open angle glaucoma; EBMD epithelial/anterior basement membrane dystrophy; ACIOL anterior chamber intraocular lens; IOL intraocular lens; PCIOL posterior chamber intraocular lens; Phaco/IOL phacoemulsification with intraocular lens placement; [place] photorefractive keratectomy; LASIK laser assisted in situ keratomileusis; HTN hypertension; DM diabetes mellitus; COPD chronic obstructive pulmonary disease

## 2020-07-06 NOTE — Assessment & Plan Note (Signed)
Continue to monitor  daily  blood pressure, and treat with CPAP to prevent nighttime high hypoxemic induced hypertensive episodes

## 2020-07-06 NOTE — Assessment & Plan Note (Signed)
OD with focal CSME superotemporal margin of the foveal avascular zone slightly worse than last visit December but overall improved as compared to November 2021.  We will repeat injection intravitreal Avastin OD today, first injection OD since 03/16/2020

## 2020-07-06 NOTE — Assessment & Plan Note (Signed)
Continue with CPAP, as patient has improved sense of wellbeing and rested sensation since commencing with this therapy  Moreover treatment of nighttime hypoxemic and hypertensive events that occur with sleep apnea should assist in resolution of posterior pole hypertensive changes and the findings of the retina

## 2020-07-13 ENCOUNTER — Telehealth: Payer: Self-pay

## 2020-07-13 NOTE — Chronic Care Management (AMB) (Signed)
° ° °  Chronic Care Management Pharmacy Assistant   Name: Shannon [name]  MRN: [id number] DOB: 1955-11-30  Reason for Encounter: Medication Review  .  PCP : [name], FNP  Allergies:  No Known Allergies  Medications: Outpatient Encounter Medications as of 07/13/2020  Medication Sig   atorvastatin (LIPITOR) 20 MG tablet Take 1 tablet (20 mg total) by mouth daily.   blood glucose meter kit and supplies KIT Dispense based on patient and insurance preference. Use 3 times daily as directed to check blood sugar. Dx code e11.65   Blood Glucose Monitoring Suppl (ONE TOUCH ULTRA MINI) w/Device KIT 1 each by Does not apply route 4 (four) times daily -  before meals and at bedtime.   Cholecalciferol (VITAMIN D-3 PO) Take 1 capsule by mouth daily. 2000 units   Continuous Blood Gluc Receiver (FREESTYLE LIBRE 14 DAY READER) DEVI USE TO CHECK BLOOD SUGARS FOUR TIMES A DAY DX:E11.65   Continuous Blood Gluc Sensor (FREESTYLE LIBRE 2 SENSOR) MISC USE AS DIRECTED EVERY 14 DAYS   glucose blood test strip Use as instructed   glucose blood test strip Use as instructed to check blood sugar 3 times a day. Dx code e11.65   insulin degludec (TRESIBA FLEXTOUCH) 100 UNIT/ML FlexTouch Pen Inject 0.21 mLs (21 Units total) into the skin daily.   Lancets (ONETOUCH ULTRASOFT) lancets Use as instructed to check blood sugars 4 times daily E11.9   lisinopril (ZESTRIL) 10 MG tablet Take 1 tablet (10 mg total) by mouth daily.   metFORMIN (GLUCOPHAGE XR) 750 MG 24 hr tablet Take 1 tablet (750 mg total) by mouth daily with breakfast.   Semaglutide,0.25 or 0.5MG /DOS, (OZEMPIC, 0.25 OR 0.5 MG/DOSE,) 2 MG/1.5ML SOPN Inject 0.5 mg into the skin once a week.   No facility-administered encounter medications on file as of 07/13/2020.    Current Diagnosis: Patient Active Problem List   Diagnosis Date Noted   Hypertensive retinopathy of both eyes 07/06/2020   Type 2 macular telangiectasis of right eye  05/04/2020   Sleep apnea in adult 03/16/2020   Vitreous floaters of left eye 12/29/2019   Other long term (current) drug therapy 11/16/2019   Snores 10/27/2019   Moderate nonproliferative diabetic retinopathy of right eye with macular edema (HCC) 10/06/2019   Moderate nonproliferative diabetic retinopathy of left eye with macular edema associated with type 2 diabetes mellitus ([place]) 10/06/2019   Retinal exudates and deposits 10/06/2019   Cough 11/20/2018   Other fatigue 11/20/2018   COVID-19 virus infection 10/13/2018   Hypokalemia 10/13/2018   Pyelonephritis 06/17/2018      Follow-Up:  Patient Assistance Coordination  New application for patient assistance form to [employer] for [employer]. Awaiting for provider and patient's signature.  Called patient to see when she can come by PCP office to sign application and to bring in proof of income .Left message for patient to call me back.   Uploaded application to documents .  Vallie Pearson,CPP Notified  Judithann Sheen, [facility] Clinical Pharmacist Assistant [phone]

## 2020-07-14 ENCOUNTER — Telehealth: Payer: Self-pay

## 2020-07-14 NOTE — Chronic Care Management (AMB) (Signed)
Chronic Care Management Pharmacy Assistant   Name: Shannon [name]  MRN: [id number] DOB: March 09, 1956  Reason for Encounter: Medication Review  Patient Questions:  1.  Have you seen any other providers since your last visit? Yes, 07/06/2020- Dr Zadie Rhine (Opthamology)  2.  Any changes in your medicines or health? Yes, 07/06/2020- Bevacizumab 2.5 mg administered by Dr Zadie Rhine (Opthamology)     PCP : [name], FNP  Allergies:  No Known Allergies  Medications: Outpatient Encounter Medications as of 07/14/2020  Medication Sig   atorvastatin (LIPITOR) 20 MG tablet Take 1 tablet (20 mg total) by mouth daily.   blood glucose meter kit and supplies KIT Dispense based on patient and insurance preference. Use 3 times daily as directed to check blood sugar. Dx code e11.65   Blood Glucose Monitoring Suppl (ONE TOUCH ULTRA MINI) w/Device KIT 1 each by Does not apply route 4 (four) times daily -  before meals and at bedtime.   Cholecalciferol (VITAMIN D-3 PO) Take 1 capsule by mouth daily. 2000 units   Continuous Blood Gluc Receiver (FREESTYLE LIBRE 14 DAY READER) DEVI USE TO CHECK BLOOD SUGARS FOUR TIMES A DAY DX:E11.65   Continuous Blood Gluc Sensor (FREESTYLE LIBRE 2 SENSOR) MISC USE AS DIRECTED EVERY 14 DAYS   glucose blood test strip Use as instructed   glucose blood test strip Use as instructed to check blood sugar 3 times a day. Dx code e11.65   insulin degludec (TRESIBA FLEXTOUCH) 100 UNIT/ML FlexTouch Pen Inject 0.21 mLs (21 Units total) into the skin daily.   Lancets (ONETOUCH ULTRASOFT) lancets Use as instructed to check blood sugars 4 times daily E11.9   lisinopril (ZESTRIL) 10 MG tablet Take 1 tablet (10 mg total) by mouth daily.   metFORMIN (GLUCOPHAGE XR) 750 MG 24 hr tablet Take 1 tablet (750 mg total) by mouth daily with breakfast.   Semaglutide,0.25 or 0.5MG/DOS, (OZEMPIC, 0.25 OR 0.5 MG/DOSE,) 2 MG/1.5ML SOPN Inject 0.5 mg into the skin once a week.   No  facility-administered encounter medications on file as of 07/14/2020.    Current Diagnosis: Patient Active Problem List   Diagnosis Date Noted   Hypertensive retinopathy of both eyes 07/06/2020   Type 2 macular telangiectasis of right eye 05/04/2020   Sleep apnea in adult 03/16/2020   Vitreous floaters of left eye 12/29/2019   Other long term (current) drug therapy 11/16/2019   Snores 10/27/2019   Moderate nonproliferative diabetic retinopathy of right eye with macular edema ([place]) 10/06/2019   Moderate nonproliferative diabetic retinopathy of left eye with macular edema associated with type 2 diabetes mellitus ([place]) 10/06/2019   Retinal exudates and deposits 10/06/2019   Cough 11/20/2018   Other fatigue 11/20/2018   COVID-19 virus infection 10/13/2018   Hypokalemia 10/13/2018   Pyelonephritis 06/17/2018   Reviewed chart for medication changes ahead of medication coordination call.  Consults-07/06/2020- Dr Zadie Rhine (Opthamology) since last care coordination call/Pharmacist visit.  Medication treatment indicated: 07/06/2020- Bevacizumab 2.5 mg administered by Dr Zadie Rhine (Opthamology).   BP Readings from Last 3 Encounters:  05/23/20 126/60  03/22/20 132/70  02/16/20 130/70    Lab Results  Component Value Date   HGBA1C 6.7 (H) 05/23/2020     Patient obtains medications through Adherence Packaging  30 Days   Last adherence delivery included:  Metformin 750 mg- 1 tablet daily(breakfast) Atorvastatin 20 mg- 1 tablet daily(bedtime) Lisinopril 10 mg - 1 tablet daily(breakfast)  Vitamin D3 2000 units- 1 capsule daily (eveining meal) Freestyle Libre 2  Sensors- use to check blood sugars tid, change every 14 days.  Patient declined the following medications last month:  Tresiba Flextouch- 21 units sq daily due to receiving a 72 day supply on 05/27/2020. Onetouch ultra test strip and lancets to check blood sugars three times a day due to discontinuing, changed to  [employer].    Patient is due for next adherence delivery on: 07/22/2020. 07/15/2020- Called patient to review medications and coordinate delivery, no answer, left message to return call.   07/15/2020- Patient called [facility] and informed them that we can deliver her sensors when we deliver her cycle medications in the next couple of weeks. Adherence delivery scheduled for 07/29/2020.  This delivery to include: Metformin 750 mg 1 tablet daily(breakfast) Atorvastatin 20 mg 1 tablet daily(bedtime) Lisinopril 10 mg 1 tablet daily(breakfast) Freestyle Libre Kit 2 Sensors- Use as directed every 14 days  No short fill needed.  Coordinated acute future fill for Tresiba Flextouch - Inject 21 units sq daily to be delivered on 08/08/2020.   Patient declined the following medications: Onetouch ultra test strip and lancets to check blood sugars three times a day due to discontinuing, changed to [employer].    Patient needs refills for- None.  Confirmed delivery date of 0204/2022, advised patient that pharmacy will contact them the morning of delivery.  Follow-Up:  Coordination of Enhanced Pharmacy Services and Pharmacist Review   [name], [place] Pharmacist Assistant [phone]

## 2020-07-15 ENCOUNTER — Other Ambulatory Visit: Payer: Self-pay | Admitting: Nurse Practitioner

## 2020-07-17 DIAGNOSIS — G4733 Obstructive sleep apnea (adult) (pediatric): Secondary | ICD-10-CM | POA: Diagnosis not present

## 2020-07-25 ENCOUNTER — Telehealth: Payer: Self-pay

## 2020-07-25 NOTE — Progress Notes (Signed)
07/25/20-Attempted to outreach the patient to remind her of upcoming CCM Call appointment on 07/26/20 at 2:00 pm with Cherylin Mylar, CPP. Unable to leave a voicemail; voicemail box is full.  Cherylin Mylar, CPP Notified.  Suezanne Cheshire, [facility] Clinical Pharmacist Assistant [phone]

## 2020-07-26 ENCOUNTER — Ambulatory Visit: Payer: Self-pay

## 2020-07-26 DIAGNOSIS — E119 Type 2 diabetes mellitus without complications: Secondary | ICD-10-CM

## 2020-07-26 DIAGNOSIS — I10 Essential (primary) hypertension: Secondary | ICD-10-CM

## 2020-07-26 DIAGNOSIS — E782 Mixed hyperlipidemia: Secondary | ICD-10-CM

## 2020-07-26 DIAGNOSIS — Z794 Long term (current) use of insulin: Secondary | ICD-10-CM

## 2020-07-26 NOTE — Chronic Care Management (AMB) (Signed)
Chronic Care Management Pharmacy  Name: Shannon [name]  MRN: [id number] DOB: Jul 20, 1955  Subjective:  She is semi-retired starting tomorrow. She has been a Marine scientist since 1977. She got an associate in nursing from Shannon [name]. Her son is 30 and is a gentle giant. Her daughter is 87 and her name is Shannon [name]. Patient reports that she has had some swelling and bleeding behind the eyes so she sees the eye doctor regularly and your injections Avastin in the right eye. She is going back to see the doctor in March to re-evaluate the eyes. You are learning from being very involved. She is still working but after tomorrow she will determine how much longer she will work due to her social security appointment. She reports that her husband was a psychology major. Her daughter passed in 2011 from Lupus. Her brother passed from kidney failure yesterday at 45. She was in the [employer] for 8 years.  She reports that kidney issues run in her family.   Chief Complaint/ HPI  Shannon [name],  65 y.o. , female presents for their Follow-Up CCM visit with the clinical pharmacist via telephone due to COVID-19 Pandemic.  PCP : Shannon Brine, FNP  Their chronic conditions include: HTN, DM, HLD.   Office Visits: 03/16/20 Patient message: Pt notified HgbA1c stable at 7.5%. Will likely need to increase to Ozempic 34m weekly  02/16/20 OV: DM follow up. Lipid panel results stable. Tolerating Ozempic well (obtained through patient assistance). Continue checking BG 4 times daily. Screened for hepatitis C antibody.   01/28/20: Metformin changed to XR 7567monce daily after PharmD recommendation  12/15/2019: Referral for sleep study.   11/16/2019 - AWV and f/u Diabetes visit - recommend Vitamin D, Calcium and Multivitamin. Referral to PharmD due to cost of Ozempic. Ordered Lipid panel, CMP14+EGFR, A1c, CBC, TSH.   08/11/2019 - recommended annual diabetic eye exam. Patient needs new glucometer for insurance.  Recheck A1c. Ordered POCT urinalysis, CMP14+EGFR, Lipid panel. Ordered DEXA scan. Prevnar 13 rx sent to pharmacy.   Consult Visits: 03/16/20 Ophthalmology V w/ Dr. RaZadie RhineCSME in right eye has improved 5 weeks post intravitreal Avastin. Repeat injection today at 5 week interval and examine in 6 weeks.  03/02/20 Neurology Sleep Study: Home sleep test showed obstructive sleep apnea, overall mild, but worth treating if she will feel better. Recommend treatment with autoPAP. [facility] will call pt to arrange autoPAP machine.   02/09/20 Ophthalmology w/ Dr. RaZadie RhineCSME has improved/resolved in left eye. Continue to monitor. CSME did not worsen in right eye at 6 week follow up. Repeat injection today and exam in 5 weeks.   01/06/20 Neurology OV w/ Dr. AtRexene AlbertsPresented for sleep consult.   12/29/2019 - retreating right eye with Avastin. Left eye appears resolved and will continue to observe.   [facility] macular edema with Avastin therapy.  [facility]17/[facility] - [facility] - Slight improvement on intravitreal Avastin, will repeat. Patient has symptoms positive for sleep apnea. Reports interested in being tested for sleep apnea.   [facility] [place] but not improved macular edema at 2 week follow-up after Avastin.    09/29/2019 - [facility] - Vitreal Avastin OD performed. 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   [facility]: [facility] Patient reports A1c is elevated due to conserving medicine  prior to getting Medicare 05/26/2019. Blood sugars range from 147-257.   Medications: Outpatient Encounter Medications as of 07/26/2020  Medication Sig  . atorvastatin (LIPITOR) 20 MG tablet Take 1 tablet (20 mg total) by mouth daily.  . blood glucose meter kit and supplies KIT Dispense based on patient  and insurance preference. Use 3 times daily as directed to check blood sugar. Dx code e11.65  . Blood Glucose Monitoring Suppl (ONE TOUCH ULTRA MINI) w/Device KIT 1 each by Does not apply route 4 (four) times daily -  before meals and at bedtime.  . Cholecalciferol (VITAMIN D-3 PO) Take 1 capsule by mouth daily. 2000 units  . Continuous Blood Gluc Receiver (FREESTYLE LIBRE 14 DAY READER) DEVI USE TO CHECK BLOOD SUGARS FOUR TIMES A DAY DX:E11.65  . Continuous Blood Gluc Sensor (FREESTYLE LIBRE 2 SENSOR) MISC USE AS DIRECTED every 14 DAYS  . glucose blood test strip Use as instructed  . glucose blood test strip Use as instructed to check blood sugar 3 times a day. Dx code e11.65  . insulin degludec (TRESIBA FLEXTOUCH) 100 UNIT/ML FlexTouch Pen Inject 0.21 mLs (21 Units total) into the skin daily.  . Lancets (ONETOUCH ULTRASOFT) lancets Use as instructed to check blood sugars 4 times daily E11.9  . lisinopril (ZESTRIL) 10 MG tablet Take 1 tablet (10 mg total) by mouth daily.  . metFORMIN (GLUCOPHAGE XR) 750 MG 24 hr tablet Take 1 tablet (750 mg total) by mouth daily with breakfast.  . Semaglutide,0.25 or 0.5MG/DOS, (OZEMPIC, 0.25 OR 0.5 MG/DOSE,) 2 MG/1.5ML SOPN Inject 0.5 mg into the skin once a week.   No facility-administered encounter medications on file as of 07/26/2020.   No Known Allergies  Current Diagnosis/Assessment:   Goals Addressed            This Visit's Progress   . Pharmacy Care Plan       CARE PLAN ENTRY (see longitudinal plan of care for additional care plan information)  Current Barriers:  . Chronic Disease Management support, education, and care coordination needs related to Hypertension, Hyperlipidemia, Diabetes, and Osteopenia   Hypertension BP Readings from Last 3 Encounters:  01/06/20 112/60  12/15/19 130/72  11/16/19 130/86   . Pharmacist Clinical Goal(s): o Over the next 180 days, patient will work with PharmD and providers to maintain BP goal  <130/80 . Current regimen:  o Lisinopril 30m daily . Interventions: o Provided dietary and exercise recommendations . Patient self care activities - Over the next 180 days, patient will: o Check BP if symptomatic, document, and provide at future appointments o Ensure daily salt intake < 2300 mg/day o Continue exercising for at least 30 minutes 5 times weekly (150 minutes per week total)  Hyperlipidemia Lab Results  Component Value Date/Time   LDLCALC 50 05/23/2020 11:39 AM   . Pharmacist Clinical Goal(s): o Over the next 90 days, patient will work with PharmD and providers to maintain LDL goal < 70 . Current regimen:  o Atorvastatin 279mdaily . Interventions: o Provided dietary and exercise recommendations o Discussed appropriate goals for LDL, HDL and triglycerides . Patient self care activities - Over the next 90 days, patient will: o Exercise for at least 30 minutes daily 5 times per week o Limits fried foods and fatty foods o Use PLATE method for planning a well-balanced diet  Diabetes Lab Results  Component Value Date/Time   HGBA1C 6.7 (H) 05/23/2020 11:39 AM   HGBA1C 7.5 (H) 02/25/2020 04:32 PM   . Pharmacist  Clinical Goal(s): o Over the next 90 days, patient will work with PharmD and providers to maintain A1c goal <7% . Current regimen:   Metformin XR 722m once daily  Ozempic 0.5 mg into the skin once a week (Fridays)  Tresiba 100 units/mL 21 units into the skin daily . Interventions: o Provided dietary and exercise recommendations o Recommend patient drink 64 ounces of water daily o Recommend patient schedule appointment for Diabetic foot exam  o Discussed appropriate goal for fasting blood sugar (80-130) . Patient self care activities - Over the next 90 days, patient will: o Check blood sugar 3-4 times daily, document, and provide at future appointments o Contact provider with any episodes of hypoglycemia o Exercise for at least 30 minutes daily 5 times  per week o   Osteopenia . Pharmacist Clinical Goal(s) o Over the next 180 days, patient will work with PharmD and providers to take appropriate supplementation to strengthen bones . Current regimen:  o  Vitamin D3 2000 units daily . Interventions: o Provided patient education about osteopenia and recommended supplements  o Encouraged weight bearing exercises 3 days per week . Patient self care activities - Over the next 90 days, patient will: o Continue calcium 120106mdaily o Start taking Cholecalciferol 2000 units daily (will provide in medication packaging) o Start weight-bearing exercises 3 days per week  Medication management . Pharmacist Clinical Goal(s): o Over the next 180 days, patient will work with PharmD and providers to maintain optimal medication adherence . Current pharmacy: [facility] . Interventions o Comprehensive medication review performed. o Verified last fill dates and when patient will need mediations filled again - Review all medications needed for delivery on 03/25/20 today o Utilize [facility] for medication synchronization, packaging and delivery o Discussed recommended vaccines and appropriate timing . Patient self care activities - Over the next 180 days, patient will: o Focus on medication adherence by utilization adherence packaging and medication synchronization o Take medications as prescribed o Report any questions or concerns to PharmD and/or provider(s)  Please see past updates related to this goal by clicking on the "Past Updates" button in the selected goal        Hyperlipidemia   LDL goal < 70  Lipid Panel     Component Value Date/Time   CHOL 119 05/23/2020 1139   TRIG 104 05/23/2020 1139   HDL 50 05/23/2020 1139   LDLCALC 50 05/23/2020 1139    Hepatic Function Latest Ref Rng & Units 05/23/2020 02/16/2020 11/16/2019  Total Protein 6.0 - 8.5 g/dL 6.7 7.2 7.1  Albumin 3.8 - 4.8 g/dL 4.4 4.9(H) 4.7  AST 0 - 40 IU/L 12 18  13   ALT 0 - 32 IU/L 21 21 17   Alk Phosphatase 44 - 121 IU/L 80 98 91  Total Bilirubin 0.0 - 1.2 mg/dL 0.3 0.3 0.5    The ASCVD Risk score (GMikey BussingC Jr., et al., 2013) failed to calculate for the following reasons:   The valid total cholesterol range is 130 to 320 mg/dL   Patient has failed these meds in past: N/A Patient is currently controlled on the following medications:  . Atorvastatin 20 mg daily  We discussed:   Pt is taking her medication everyday   Diet and exercise extensively  She is eating a lot of vegetables and salad   She enjoys rice but she tries to drain it.   No longer eating red meat  Avoiding fried and fatty foods  She rarely craves any  fried foods  Plan Continue current medications  Diabetes   Goal <7 Recent Relevant Labs: Lab Results  Component Value Date/Time   HGBA1C 6.7 (H) 05/23/2020 11:39 AM   HGBA1C 7.5 (H) 02/25/2020 04:32 PM   MICROALBUR 80 08/11/2019 10:29 AM    Checking BG: Daily  Recent FBG Readings:< 130 (72- Recent pre-meal BG readings:  Recent 2hr PP BG readings:  Recent HS BG readings: Highest 169, lowest 104, 140, 120 Patient has failed these meds in past: Victoza, Actos Patient is currently uncontrolled on the following medications:   Metformin XR 752m once daily  Ozempic 0.5 mg into the skin once a week (Fridays)  TTyler Aas100 units/mL 21 units into the skin daily  Last diabetic Foot exam: 06/01/19 Last diabetic Eye exam:  Lab Results  Component Value Date/Time   HMDIABEYEEXA Retinopathy (A) 09/14/2019 12:00 AM     We discussed:   Congratulated Pt on lowering her A1c  FreeStyle Libre 2 system  Will coordinate with the pharmacy to   She is very happy with her sensor system - the alarms    Parameters   <60       >160  Determined pt received OneTouch Ultra meter on 9/16, but states that it does not work with the test strips she has -back up glucose monitor   Pt is going to schedule a foot doctor  appointment   FBG goal 80-130 . Diet extensively o Trying to watch what she eats o Will get more of a regimen once she retires . Exercise extensively o Water aerobics 4 times a week (45 minutes) o Silver Sneakers 2 times weekly (45 minutes) o Congratulated pt on exercise regimen Plan Continue current medications  Coordinate with PCP staff to in prescription for FreeStyle Libre 2 sensors  Hypertension   Office blood pressures are  BP Readings from Last 3 Encounters:  05/23/20 126/60  03/22/20 132/70  02/16/20 130/70   Patient has failed these meds in the past: N/A Patient is currently controlled on the following medications:   Lisinopril 10 mg daily  Patient checks BP at home infrequently   Patient home BP readings are ranging: None to provide  We discussed:  No issues with blood pressure   Denies headaches, lightheadedness, dizziness  Patient reports that this medication is for kidney protection   Takes medication everyday  Plan Continue current medications   Osteopenia / Osteoporosis   Last DEXA Scan: 12/09/2019   T-Score femoral neck: -1.7  T-Score forearm radius: -0.3  10-year probability of major osteoporotic fracture: 4.3%  10-year probability of hip fracture: 0.6%  No results found for: VD25OH   Patient is not a candidate for pharmacologic treatment  Patient has failed these meds in past: N/A Patient is currently uncontrolled on the following medications: N/A  We discussed:  Recommend 1200 mg of calcium daily from dietary and supplemental sources. Recommend weight-bearing and muscle strengthening exercises for building and maintaining bone density.   Will include VitaminD 2000 units with medication delivery  Plan Continue current medications  Recommend Vitamin D at next PCP office visit  Health Maintenance   Patient is currently on the following medications:  .Marland KitchenVitamin C 1000 mg daily  . Fish Oil daily  Plan Continue current  medications  Vaccines   Reviewed and discussed patient's vaccination history.    Immunization History  Administered Date(s) Administered  . Fluad Quad(high Dose 65+) 03/22/2020  . Influenza,inj,Quad PF,6+ Mos 04/18/2018  . Influenza-Unspecified 05/01/2019  . PFIZER(Purple  Top)SARS-COV-2 Vaccination 09/18/2019, 10/14/2019, 04/19/2020  . Pneumococcal Polysaccharide-23 05/30/2018  . Tdap 11/28/2017   Plan Recommend HD flu shot today in office, Prevnar13 at local pharmacy 2 weeks ([facility] [address]), Shingrix series at local pharmacy in 2022  Medication Management   Pt uses [facility] for all medications Uses pill box? Yes Pt endorses 80% compliance  We discussed:  Importance of taking all medications as directed every day Reviewed all medications prior to scheduled delivery on 03/25/20  Discussed adherence packaging  Plan Utilize [facility] for medication synchronization, packaging and delivery     Follow up: 4 month visit  [name], PharmD Clinical Pharmacist [facility] [phone]

## 2020-07-26 NOTE — Patient Instructions (Signed)
Visit Information  Goals Addressed            This Visit's Progress   . Pharmacy Care Plan       CARE PLAN ENTRY (see longitudinal plan of care for additional care plan information)  Current Barriers:  . Chronic Disease Management support, education, and care coordination needs related to Hypertension, Hyperlipidemia, Diabetes, and Osteopenia   Hypertension BP Readings from Last 3 Encounters:  01/06/20 112/60  12/15/19 130/72  11/16/19 130/86   . Pharmacist Clinical Goal(s): o Over the next 180 days, patient will work with PharmD and providers to maintain BP goal <130/80 . Current regimen:  o Lisinopril 10mg  daily . Interventions: o Provided dietary and exercise recommendations . Patient self care activities - Over the next 180 days, patient will: o Check BP if symptomatic, document, and provide at future appointments o Ensure daily salt intake < 2300 mg/day o Continue exercising for at least 30 minutes 5 times weekly (150 minutes per week total)  Hyperlipidemia Lab Results  Component Value Date/Time   LDLCALC 50 05/23/2020 11:39 AM   . Pharmacist Clinical Goal(s): o Over the next 90 days, patient will work with PharmD and providers to maintain LDL goal < 70 . Current regimen:  o Atorvastatin 20mg  daily . Interventions: o Provided dietary and exercise recommendations o Discussed appropriate goals for LDL, HDL and triglycerides . Patient self care activities - Over the next 90 days, patient will: o Exercise for at least 30 minutes daily 5 times per week o Limits fried foods and fatty foods o Use PLATE method for planning a well-balanced diet  Diabetes Lab Results  Component Value Date/Time   HGBA1C 6.7 (H) 05/23/2020 11:39 AM   HGBA1C 7.5 (H) 02/25/2020 04:32 PM   . Pharmacist Clinical Goal(s): o Over the next 90 days, patient will work with PharmD and providers to maintain A1c goal <7% . Current regimen:   Metformin XR 750mg  once daily  Ozempic 0.5 mg into  the skin once a week (Fridays)  Tresiba 100 units/mL 21 units into the skin daily . Interventions: o Provided dietary and exercise recommendations o Recommend patient drink 64 ounces of water daily o Recommend patient schedule appointment for Diabetic foot exam  o Discussed appropriate goal for fasting blood sugar (80-130) . Patient self care activities - Over the next 90 days, patient will: o Check blood sugar 3-4 times daily, document, and provide at future appointments o Contact provider with any episodes of hypoglycemia o Exercise for at least 30 minutes daily 5 times per week o   Osteopenia . Pharmacist Clinical Goal(s) o Over the next 180 days, patient will work with PharmD and providers to take appropriate supplementation to strengthen bones . Current regimen:  o  Vitamin D3 2000 units daily . Interventions: o Provided patient education about osteopenia and recommended supplements  o Encouraged weight bearing exercises 3 days per week . Patient self care activities - Over the next 90 days, patient will: o Continue calcium 1200mg  daily o Start taking Cholecalciferol 2000 units daily (will provide in medication packaging) o Start weight-bearing exercises 3 days per week  Medication management . Pharmacist Clinical Goal(s): o Over the next 180 days, patient will work with PharmD and providers to maintain optimal medication adherence . Current pharmacy: [facility] . Interventions o Comprehensive medication review performed. o Verified last fill dates and when patient will need mediations filled again - Review all medications needed for delivery on 03/25/20 today o Utilize [facility] for medication synchronization, packaging and delivery o Discussed recommended vaccines and appropriate timing . Patient self care activities - Over the next 180 days, patient will: o Focus on medication adherence by utilization adherence packaging and medication  synchronization o Take medications as prescribed o Report any questions or concerns to PharmD and/or provider(s)  Please see past updates related to this goal by clicking on the "Past Updates" button in the selected goal         The patient verbalized understanding of instructions, educational materials, and care plan provided today and declined offer to receive copy of patient instructions, educational materials, and care plan.   The pharmacy team will reach out to the patient again over the next 60 days.   Harlan Stains, [facility]

## 2020-07-27 ENCOUNTER — Ambulatory Visit: Payer: Self-pay | Admitting: Neurology

## 2020-07-28 ENCOUNTER — Telehealth: Payer: Self-pay

## 2020-07-28 NOTE — Telephone Encounter (Signed)
Pt arrived over 5 mins late to her 730 am appt with Dr. Frances Furbish on 07/27/20. Pt was not seen.

## 2020-08-17 ENCOUNTER — Ambulatory Visit (INDEPENDENT_AMBULATORY_CARE_PROVIDER_SITE_OTHER): Payer: HMO | Admitting: Ophthalmology

## 2020-08-17 ENCOUNTER — Other Ambulatory Visit: Payer: Self-pay

## 2020-08-17 ENCOUNTER — Encounter (INDEPENDENT_AMBULATORY_CARE_PROVIDER_SITE_OTHER): Payer: Self-pay | Admitting: Ophthalmology

## 2020-08-17 DIAGNOSIS — E113311 Type 2 diabetes mellitus with moderate nonproliferative diabetic retinopathy with macular edema, right eye: Secondary | ICD-10-CM | POA: Diagnosis not present

## 2020-08-17 MED ORDER — BEVACIZUMAB 2.5 MG/0.1ML IZ SOSY
2.5000 mg | PREFILLED_SYRINGE | INTRAVITREAL | Status: AC | PRN
  Administered 2020-08-17: 2.5 mg via INTRAVITREAL

## 2020-08-17 NOTE — Progress Notes (Signed)
08/17/2020     CHIEF COMPLAINT Patient presents for Retina Follow Up (6 Week F/U OD, poss Avastin OD//Pt denies noticeable changes to [place] OU since last visit. Pt denies ocular pain, flashes of light, or floaters OU. //LBS: 102 this AM)   HISTORY OF PRESENT ILLNESS: Shannon [name] is a 65 y.o. female who presents to the clinic today for:   HPI    Retina Follow Up    Patient presents with  Diabetic Retinopathy.  In right eye.  This started 6 weeks ago.  Severity is mild.  Duration of 6 weeks.  Since onset it is stable. Additional comments: 6 Week F/U OD, poss Avastin OD  Pt denies noticeable changes to [place] OU since last visit. Pt denies ocular pain, flashes of light, or floaters OU.   LBS: 102 this AM       Last edited by Rockie Neighbours, [place] on 08/17/2020 10:32 AM. (History)      Referring physician: [name], [address] [place],  [place] [address]  HISTORICAL INFORMATION:   Selected notes from the [facility]    Lab Results  Component Value Date   HGBA1C 6.7 (H) 05/23/2020     CURRENT MEDICATIONS: No current outpatient medications on file. (Ophthalmic Drugs)   No current facility-administered medications for this visit. (Ophthalmic Drugs)   Current Outpatient Medications (Other)  Medication Sig  . atorvastatin (LIPITOR) 20 MG tablet Take 1 tablet (20 mg total) by mouth daily.  . blood glucose meter kit and supplies KIT Dispense based on patient and insurance preference. Use 3 times daily as directed to check blood sugar. Dx code e11.65  . Blood Glucose Monitoring Suppl (ONE TOUCH ULTRA MINI) w/Device KIT 1 each by Does not apply route 4 (four) times daily -  before meals and at bedtime.  . Cholecalciferol (VITAMIN D-3 PO) Take 1 capsule by mouth daily. 2000 units  . Continuous Blood Gluc Receiver (FREESTYLE LIBRE 14 DAY READER) DEVI USE TO CHECK BLOOD SUGARS FOUR TIMES A DAY DX:E11.65  . Continuous Blood Gluc Sensor (FREESTYLE  LIBRE 2 SENSOR) MISC USE AS DIRECTED every 14 DAYS  . glucose blood test strip Use as instructed  . glucose blood test strip Use as instructed to check blood sugar 3 times a day. Dx code e11.65  . insulin degludec (TRESIBA FLEXTOUCH) 100 UNIT/ML FlexTouch Pen Inject 0.21 mLs (21 Units total) into the skin daily.  . Lancets (ONETOUCH ULTRASOFT) lancets Use as instructed to check blood sugars 4 times daily E11.9  . lisinopril (ZESTRIL) 10 MG tablet Take 1 tablet (10 mg total) by mouth daily.  . metFORMIN (GLUCOPHAGE XR) 750 MG 24 hr tablet Take 1 tablet (750 mg total) by mouth daily with breakfast.  . Semaglutide,0.25 or 0.5MG/DOS, (OZEMPIC, 0.25 OR 0.5 MG/DOSE,) 2 MG/1.5ML SOPN Inject 0.5 mg into the skin once a week.   No current facility-administered medications for this visit. (Other)      REVIEW OF SYSTEMS:    ALLERGIES No Known Allergies  PAST MEDICAL HISTORY Past Medical History:  Diagnosis Date  . Diabetes mellitus without complication ([place])   . Hypercholesteremia   . Hypertension   . Sleep apnea    Newly diagnosed in September 2021   Past Surgical History:  Procedure Laterality Date  . ABDOMINAL HYSTERECTOMY    . CATARACT EXTRACTION Bilateral   . CESAREAN SECTION    . MYOMECTOMY      FAMILY HISTORY Family History  Problem  Relation Age of Onset  . Hypertension Mother   . Diabetes Mother   . Stroke Mother   . Diabetes Father   . Hypertension Father   . Stroke Father   . Hypertension Son   . Diabetes Son   . Breast cancer Neg Hx     SOCIAL HISTORY Social History   Tobacco Use  . Smoking status: Former Smoker    Quit date: 2006    Years since quitting: 16.1  . Smokeless tobacco: Never Used  . Tobacco comment: quit 10 yrs  Vaping Use  . Vaping Use: Never used  Substance Use Topics  . Alcohol use: Yes    Comment: 1 glass of wine sometimes daily   . Drug use: Never         OPHTHALMIC EXAM: Base Eye Exam    Visual Acuity (ETDRS)      Right  Left   Dist [place] 20/25 +2 20/25       Tonometry (Tonopen, 10:32 AM)      Right Left   Pressure 13 11       Pupils      Pupils Dark Light Shape React APD   Right PERRL 3 2 Round Brisk None   Left PERRL 3 2 Round Brisk None       Visual Fields (Counting fingers)      Left Right    Full Full       Extraocular Movement      Right Left    Full Full       Neuro/Psych    Oriented x3: Yes   Mood/Affect: Normal       Dilation    Right eye: 1.0% Mydriacyl, 2.5% Phenylephrine @ 10:35 AM        Slit Lamp and Fundus Exam    External Exam      Right Left   External Normal Normal       Slit Lamp Exam      Right Left   Lids/Lashes Normal Normal   Conjunctiva/Sclera White and quiet White and quiet   Cornea Clear Clear   Anterior Chamber Deep and quiet Deep and quiet   Iris Round and reactive Round and reactive   Lens Posterior chamber intraocular lens Posterior chamber intraocular lens   Anterior Vitreous Normal Normal       Fundus Exam      Right Left   Posterior Vitreous Normal    Disc Normal    C/D Ratio 0.2    Macula Clinically significant macular edema, Exudates, Macular thickening, Mild clinically significant macular edema    Vessels NPDR- Moderate    Periphery Normal           IMAGING AND PROCEDURES  Imaging and Procedures for 08/17/20  OCT, Retina - OU - Both Eyes       Right Eye Quality was good. Scan locations included subfoveal. Central Foveal Thickness: 295. Progression has improved.   Left Eye Quality was good. Scan locations included subfoveal. Central Foveal Thickness: 303. Progression has been stable. Findings include normal foveal contour.   Notes OD with focal CSME superotemporal margin of the foveal avascular zone  overall improved as compared to November 2021 and improved as compared to last visit 07-06-2020.  We will repeat injection intravitreal Avastin OD today, first injection OD since 03/16/2020       Intravitreal Injection,  Pharmacologic Agent - OD - Right Eye       Time Out 08/17/2020. 11:48 AM.  Confirmed correct patient, procedure, site, and patient consented.   Anesthesia Topical anesthesia was used. Anesthetic medications included [place] 3.5%.   Procedure Preparation included Tobramycin 0.3%, 5% betadine to ocular surface, 10% betadine to eyelids. A 30 gauge needle was used.   Injection:  2.5 mg Bevacizumab (AVASTIN) 2.53m/0.1mL SOSY   NDC:: 41660-630-16 Lot:: [id number]  Route: Intravitreal, Site: Right Eye  Post-op Post injection exam found visual acuity of at least counting fingers. The patient tolerated the procedure well. There were no complications. The patient received written and verbal post procedure care education. Post injection medications were not given.                 ASSESSMENT/PLAN:  No problem-specific Assessment & Plan notes found for this encounter.      ICD-10-CM   1. Moderate nonproliferative diabetic retinopathy of right eye with macular edema associated with type 2 diabetes mellitus (HCC)  E11.3311 OCT, Retina - OU - Both Eyes    Intravitreal Injection, Pharmacologic Agent - OD - Right Eye    bevacizumab (AVASTIN) SOSY 2.5 mg    1.  CSME OD has improved now at follow-up visit of 6 weeks.  We'll repeat injection OD today.  OCT was reviewed with the patient to confirm for her sake of understanding.  2.  Up OD again in 6 weeks and dilate and potential Avastin again  3.  Ophthalmic Meds Ordered this visit:  Meds ordered this encounter  Medications  . bevacizumab (AVASTIN) SOSY 2.5 mg       Return in about 6 weeks (around 09/28/2020) for dilate, OD, AVASTIN OCT.  There are no Patient Instructions on file for this visit.   Explained the diagnoses, plan, and follow up with the patient and they expressed understanding.  Patient expressed understanding of the importance of proper follow up care.   GClent DemarkRankin M.D. Diseases & Surgery of the Retina and  Vitreous Retina & Diabetic EAmes02/23/22     Abbreviations: M myopia (nearsighted); A astigmatism; H hyperopia (farsighted); P presbyopia; Mrx spectacle prescription;  CTL contact lenses; OD right eye; OS left eye; OU both eyes  XT exotropia; ET esotropia; PEK punctate epithelial keratitis; PEE punctate epithelial erosions; DES dry eye syndrome; MGD meibomian gland dysfunction; ATs artificial tears; PFAT's preservative free artificial tears; NRenner Cornernuclear sclerotic cataract; PSC posterior subcapsular cataract; ERM epi-retinal membrane; PVD posterior vitreous detachment; RD retinal detachment; DM diabetes mellitus; DR diabetic retinopathy; NPDR non-proliferative diabetic retinopathy; PDR proliferative diabetic retinopathy; CSME clinically significant macular edema; DME diabetic macular edema; dbh dot blot hemorrhages; CWS cotton wool spot; POAG primary open angle glaucoma; C/D cup-to-disc ratio; HVF humphrey visual field; GVF goldmann visual field; OCT optical coherence tomography; IOP intraocular pressure; BRVO Branch retinal vein occlusion; CRVO central retinal vein occlusion; CRAO central retinal artery occlusion; BRAO branch retinal artery occlusion; RT retinal tear; SB scleral buckle; PPV pars plana vitrectomy; VH Vitreous hemorrhage; PRP panretinal laser photocoagulation; IVK intravitreal kenalog; VMT vitreomacular traction; MH Macular hole;  NVD neovascularization of the disc; NVE neovascularization elsewhere; AREDS age related eye disease study; ARMD age related macular degeneration; POAG primary open angle glaucoma; EBMD epithelial/anterior basement membrane dystrophy; ACIOL anterior chamber intraocular lens; IOL intraocular lens; PCIOL posterior chamber intraocular lens; Phaco/IOL phacoemulsification with intraocular lens placement; PDibollphotorefractive keratectomy; LASIK laser assisted in situ keratomileusis; HTN hypertension; DM diabetes mellitus; COPD chronic obstructive pulmonary disease

## 2020-08-18 ENCOUNTER — Telehealth: Payer: Self-pay

## 2020-08-18 NOTE — Chronic Care Management (AMB) (Signed)
Chronic Care Management Pharmacy Assistant   Name: Shannon [name]  MRN: [id number] DOB: 08-Oct-1955  Reason for Encounter: Medication Review   Patient Questions:  1.  Have you seen any other providers since your last visit? Yes, 08/17/20-Rankin, Clent Demark, MD    2.  Any changes in your medicines or health? No    PCP : [name], FNP  Allergies:  No Known Allergies  Medications: Outpatient Encounter Medications as of 08/18/2020  Medication Sig  . atorvastatin (LIPITOR) 20 MG tablet Take 1 tablet (20 mg total) by mouth daily.  . blood glucose meter kit and supplies KIT Dispense based on patient and insurance preference. Use 3 times daily as directed to check blood sugar. Dx code e11.65  . Blood Glucose Monitoring Suppl (ONE TOUCH ULTRA MINI) w/Device KIT 1 each by Does not apply route 4 (four) times daily -  before meals and at bedtime.  . Cholecalciferol (VITAMIN D-3 PO) Take 1 capsule by mouth daily. 2000 units  . Continuous Blood Gluc Receiver (FREESTYLE LIBRE 14 DAY READER) DEVI USE TO CHECK BLOOD SUGARS FOUR TIMES A DAY DX:E11.65  . Continuous Blood Gluc Sensor (FREESTYLE LIBRE 2 SENSOR) MISC USE AS DIRECTED every 14 DAYS  . glucose blood test strip Use as instructed  . glucose blood test strip Use as instructed to check blood sugar 3 times a day. Dx code e11.65  . insulin degludec (TRESIBA FLEXTOUCH) 100 UNIT/ML FlexTouch Pen Inject 0.21 mLs (21 Units total) into the skin daily.  . Lancets (ONETOUCH ULTRASOFT) lancets Use as instructed to check blood sugars 4 times daily E11.9  . lisinopril (ZESTRIL) 10 MG tablet Take 1 tablet (10 mg total) by mouth daily.  . metFORMIN (GLUCOPHAGE XR) 750 MG 24 hr tablet Take 1 tablet (750 mg total) by mouth daily with breakfast.  . Semaglutide,0.25 or 0.5MG/DOS, (OZEMPIC, 0.25 OR 0.5 MG/DOSE,) 2 MG/1.5ML SOPN Inject 0.5 mg into the skin once a week.   No facility-administered encounter medications on file as of 08/18/2020.     Current Diagnosis: Patient Active Problem List   Diagnosis Date Noted  . Hypertensive retinopathy of both eyes 07/06/2020  . Type 2 macular telangiectasis of right eye 05/04/2020  . Sleep apnea in adult 03/16/2020  . Vitreous floaters of left eye 12/29/2019  . Other long term (current) drug therapy 11/16/2019  . Snores 10/27/2019  . Moderate nonproliferative diabetic retinopathy of right eye with macular edema ([place]) 10/06/2019  . Moderate nonproliferative diabetic retinopathy of left eye with macular edema associated with type 2 diabetes mellitus ([place]) 10/06/2019  . Retinal exudates and deposits 10/06/2019  . Cough 11/20/2018  . Other fatigue 11/20/2018  . COVID-19 virus infection 10/13/2018  . Hypokalemia 10/13/2018  . Pyelonephritis 06/17/2018   Reviewed chart for medication changes ahead of medication coordination call.  No OVs, Consults, or hospital visits since last care coordination call/Pharmacist visit. (If appropriate, list visit date, provider name)  No medication changes indicated OR if recent visit, treatment plan here.  BP Readings from Last 3 Encounters:  05/23/20 126/60  03/22/20 132/70  02/16/20 130/70    Lab Results  Component Value Date   HGBA1C 6.7 (H) 05/23/2020     Patient obtains medications through Adherence Packaging  30 Days   Last adherence delivery included:  Metformin 750 mg 1 tablet daily(breakfast) Atorvastatin 20 mg 1 tablet daily(bedtime) Lisinopril 10 mg 1 tablet daily(breakfast) Freestyle Libre Kit 2 Sensors- Use as directed every 14 days   Patient  declined the following medications: Onetouch ultra test strip and lancets to check blood sugars three times a day due to discontinuing, changed to [employer].   Patient is due for next adherence delivery on: 08/23/2020.  08/25/20-Called patient and reviewed medications and coordinated delivery, patient is needing all medications except Tresiba Flextouch 100  Unit/ML.  08/25/20-Patient called [facility] to speak about her medication delivery, returned the phone call to the patient and asked if we can speak later at 5:00 PM due to a meeting I have to attend at 4:00 PM. The patient voiced understanding stating that will be fine.  08/24/20-no answer, unable to leave a message due to mailbox full.  08/18/20-No answer, unable to leave a message due to full mailbox.   This delivery to include: Metformin 750 mg 1 tablet daily(breakfast) Atorvastatin 20 mg 1 tablet daily(bedtime) Lisinopril 10 mg 1 tablet daily(breakfast) Freestyle Libre Kit 2 Sensors- Use as directed every 14 days Vitamin D3 2000 units- 1 capsule daily (eveining meal)  No short fill or acute fill needed.  Patient declined the following medications: Tresiba Flextouch - Inject 21 units sq daily-due to receiving a delivery of 5 pens in one box on 08/08/20 and one extra pen leftover.  Patient needs refills for-None.  Confirmed delivery date of 08/26/2020, advised patient that pharmacy will contact them the morning of delivery.  Follow-Up:  Coordination of Enhanced Pharmacy Services and Pharmacist Review-Patient stated she has medication for 1 more day. Patient reports readings are good. FBG was 114 today 08/25/2020. Patient reports not having any blood pressure complications.  [name], CPP Notified.  Raynelle Highland, [place] Pharmacist Assistant [phone] CCM Total Time:39 minutes

## 2020-08-22 ENCOUNTER — Encounter: Payer: Self-pay | Admitting: Nurse Practitioner

## 2020-08-22 ENCOUNTER — Ambulatory Visit (INDEPENDENT_AMBULATORY_CARE_PROVIDER_SITE_OTHER): Payer: HMO | Admitting: Nurse Practitioner

## 2020-08-22 ENCOUNTER — Other Ambulatory Visit: Payer: Self-pay

## 2020-08-22 VITALS — BP 132/78 | HR 75 | Temp 97.9°F | Ht 62.4 in | Wt 153.2 lb

## 2020-08-22 DIAGNOSIS — Z794 Long term (current) use of insulin: Secondary | ICD-10-CM | POA: Diagnosis not present

## 2020-08-22 DIAGNOSIS — I700 Atherosclerosis of aorta: Secondary | ICD-10-CM | POA: Diagnosis not present

## 2020-08-22 DIAGNOSIS — E119 Type 2 diabetes mellitus without complications: Secondary | ICD-10-CM

## 2020-08-22 DIAGNOSIS — I10 Essential (primary) hypertension: Secondary | ICD-10-CM | POA: Diagnosis not present

## 2020-08-22 DIAGNOSIS — Z6827 Body mass index (BMI) 27.0-27.9, adult: Secondary | ICD-10-CM | POA: Diagnosis not present

## 2020-08-22 NOTE — Patient Instructions (Signed)
Diabetes Mellitus and Nutrition, Adult When you have diabetes, or diabetes mellitus, it is very important to have healthy eating habits because your blood sugar (glucose) levels are greatly affected by what you eat and drink. Eating healthy foods in the right amounts, at about the same times every day, can help you:  Control your blood glucose.  Lower your risk of heart disease.  Improve your blood pressure.  Reach or maintain a healthy weight. What can affect my meal plan? Every person with diabetes is different, and each person has different needs for a meal plan. Your health care provider may recommend that you work with a dietitian to make a meal plan that is best for you. Your meal plan may vary depending on factors such as:  The calories you need.  The medicines you take.  Your weight.  Your blood glucose, blood pressure, and cholesterol levels.  Your activity level.  Other health conditions you have, such as heart or kidney disease. How do carbohydrates affect me? Carbohydrates, also called carbs, affect your blood glucose level more than any other type of food. Eating carbs naturally raises the amount of glucose in your blood. Carb counting is a method for keeping track of how many carbs you eat. Counting carbs is important to keep your blood glucose at a healthy level, especially if you use insulin or take certain oral diabetes medicines. It is important to know how many carbs you can safely have in each meal. This is different for every person. Your dietitian can help you calculate how many carbs you should have at each meal and for each snack. How does alcohol affect me? Alcohol can cause a sudden decrease in blood glucose (hypoglycemia), especially if you use insulin or take certain oral diabetes medicines. Hypoglycemia can be a life-threatening condition. Symptoms of hypoglycemia, such as sleepiness, dizziness, and confusion, are similar to symptoms of having too much  alcohol.  Do not drink alcohol if: ? Your health care provider tells you not to drink. ? You are pregnant, may be pregnant, or are planning to become pregnant.  If you drink alcohol: ? Do not drink on an empty stomach. ? Limit how much you use to:  0-1 drink a day for women.  0-2 drinks a day for men. ? Be aware of how much alcohol is in your drink. In the [place] one drink equals one 12 oz bottle of beer (355 mL), one 5 oz glass of wine (148 mL), or one 1 oz glass of hard liquor (44 mL). ? Keep yourself hydrated with water, diet soda, or unsweetened iced tea.  Keep in mind that regular soda, juice, and other mixers may contain a lot of sugar and must be counted as carbs. What are tips for following this plan? Reading food labels  Start by checking the serving size on the "Nutrition Facts" label of packaged foods and drinks. The amount of calories, carbs, fats, and other nutrients listed on the label is based on one serving of the item. Many items contain more than one serving per package.  Check the total grams (g) of carbs in one serving. You can calculate the number of servings of carbs in one serving by dividing the total carbs by 15. For example, if a food has 30 g of total carbs per serving, it would be equal to 2 servings of carbs.  Check the number of grams (g) of saturated fats and trans fats in one serving. Choose foods that have   a low amount or none of these fats.  Check the number of milligrams (mg) of salt (sodium) in one serving. Most people should limit total sodium intake to less than 2,300 mg per day.  Always check the nutrition information of foods labeled as "low-fat" or "nonfat." These foods may be higher in added sugar or refined carbs and should be avoided.  Talk to your dietitian to identify your daily goals for nutrients listed on the label. Shopping  Avoid buying canned, pre-made, or processed foods. These foods tend to be high in fat, sodium, and added  sugar.  Shop around the outside edge of the grocery store. This is where you will most often find fresh fruits and vegetables, bulk grains, fresh meats, and fresh dairy. Cooking  Use low-heat cooking methods, such as baking, instead of high-heat cooking methods like deep frying.  Cook using healthy oils, such as olive, canola, or sunflower oil.  Avoid cooking with butter, cream, or high-fat meats. Meal planning  Eat meals and snacks regularly, preferably at the same times every day. Avoid going long periods of time without eating.  Eat foods that are high in fiber, such as fresh fruits, vegetables, beans, and whole grains. Talk with your dietitian about how many servings of carbs you can eat at each meal.  Eat 4-6 oz (112-168 g) of lean protein each day, such as lean meat, chicken, fish, eggs, or tofu. One ounce (oz) of lean protein is equal to: ? 1 oz (28 g) of meat, chicken, or fish. ? 1 egg. ?  cup (62 g) of tofu.  Eat some foods each day that contain healthy fats, such as avocado, nuts, seeds, and fish.   What foods should I eat? Fruits Berries. Apples. Oranges. Peaches. Apricots. Plums. Grapes. Mango. Papaya. Pomegranate. Kiwi. Cherries. Vegetables Lettuce. Spinach. Leafy greens, including kale, chard, collard greens, and mustard greens. Beets. Cauliflower. Cabbage. Broccoli. Carrots. Green beans. Tomatoes. Peppers. Onions. Cucumbers. Brussels sprouts. Grains Whole grains, such as whole-wheat or whole-grain bread, crackers, tortillas, cereal, and pasta. Unsweetened oatmeal. Quinoa. Brown or wild rice. Meats and other proteins Seafood. Poultry without skin. Lean cuts of poultry and beef. Tofu. Nuts. Seeds. Dairy Low-fat or fat-free dairy products such as milk, yogurt, and cheese. The items listed above may not be a complete list of foods and beverages you can eat. Contact a dietitian for more information. What foods should I avoid? Fruits Fruits canned with  syrup. Vegetables Canned vegetables. Frozen vegetables with butter or cream sauce. Grains Refined white flour and flour products such as bread, pasta, snack foods, and cereals. Avoid all processed foods. Meats and other proteins Fatty cuts of meat. Poultry with skin. Breaded or fried meats. Processed meat. Avoid saturated fats. Dairy Full-fat yogurt, cheese, or milk. Beverages Sweetened drinks, such as soda or iced tea. The items listed above may not be a complete list of foods and beverages you should avoid. Contact a dietitian for more information. Questions to ask a health care provider  Do I need to meet with a diabetes educator?  Do I need to meet with a dietitian?  What number can I call if I have questions?  When are the best times to check my blood glucose? Where to find more information:  American Diabetes Association: diabetes.org  Academy of Nutrition and Dietetics: www.eatright.org  National Institute of Diabetes and Digestive and Kidney Diseases: www.niddk.nih.gov  Association of Diabetes Care and Education Specialists: www.diabeteseducator.org Summary  It is important to have healthy eating   habits because your blood sugar (glucose) levels are greatly affected by what you eat and drink.  A healthy meal plan will help you control your blood glucose and maintain a healthy lifestyle.  Your health care provider may recommend that you work with a dietitian to make a meal plan that is best for you.  Keep in mind that carbohydrates (carbs) and alcohol have immediate effects on your blood glucose levels. It is important to count carbs and to use alcohol carefully. This information is not intended to replace advice given to you by your health care provider. Make sure you discuss any questions you have with your health care provider. Document Revised: 05/19/2019 Document Reviewed: 05/19/2019 Elsevier Patient Education  2021 Elsevier Inc.  

## 2020-08-22 NOTE — Progress Notes (Signed)
I,[name],acting as a scribe for Janece Moore, FNP.,have documented all relevant documentation on the behalf of Janece Moore, FNP,as directed by  Janece Moore, FNP while in the presence of Janece Moore, FNP.  This visit occurred during the SARS-CoV-2 public health emergency.  Safety protocols were in place, including screening questions prior to the visit, additional usage of staff PPE, and extensive cleaning of exam room while observing appropriate contact time as indicated for disinfecting solutions.  Subjective:     Patient ID: Shannon [name] , female    DOB: 11/26/1955 , 65 y.o.   MRN: [id number]   Chief Complaint  Patient presents with  . Diabetes    HPI  Patient is here for diabetes and htn follow up. She has no concerns at this time. Patient has missed one dose of ozempic due to being out of the medication. She used her last Ozempic last week 0.5 mg.  She is working 2 days a week but is officially retired.   Wt Readings from Last 3 Encounters: 08/22/20 : 153 lb 3.2 oz (69.5 kg) 05/23/20 : 151 lb 3.2 oz (68.6 kg) 03/22/20 : 154 lb 6.4 oz (70 kg)   Diabetes She presents for her follow-up diabetic visit. She has type 2 diabetes mellitus. Her disease course has been stable. Pertinent negatives for hypoglycemia include no confusion, dizziness or nervousness/anxiousness. Pertinent negatives for diabetes include no fatigue, no polydipsia, no polyphagia and no polyuria. There are no hypoglycemic complications. Symptoms are stable. There are no diabetic complications. Risk factors for coronary artery disease include diabetes mellitus, hypertension and obesity. Current diabetic treatment includes oral agent (dual therapy). She is compliant with treatment all of the time (she has only been taking 0.25 mg weekly, trying to spread out her medication when she did not have insurance). She is following a generally healthy diet. When asked about meal planning, she reported none. She has  not had a previous visit with a dietitian. She participates in exercise every other day. Her home blood glucose trend is increasing steadily. Her overall blood glucose range is 140-180 mg/dl. (She wears the libre.  She has been without it for about one week. Ranging approximately 70's - 210's ) An ACE inhibitor/angiotensin II receptor blocker is being taken. She does not see a podiatrist.Eye exam current: not done at this time, needs a new opthamologist.     Past Medical History:  Diagnosis Date  . Diabetes mellitus without complication (HCC)   . Hypercholesteremia   . Hypertension   . Sleep apnea    Newly diagnosed in September 2021     Family History  Problem Relation Age of Onset  . Hypertension Mother   . Diabetes Mother   . Stroke Mother   . Diabetes Father   . Hypertension Father   . Stroke Father   . Hypertension Son   . Diabetes Son   . Breast cancer Neg Hx      Current Outpatient Medications:  .  atorvastatin (LIPITOR) 20 MG tablet, Take 1 tablet (20 mg total) by mouth daily., Disp: 90 tablet, Rfl: 1 .  blood glucose meter kit and supplies KIT, Dispense based on patient and insurance preference. Use 3 times daily as directed to check blood sugar. Dx code e11.65, Disp: 1 each, Rfl: 9 .  Blood Glucose Monitoring Suppl (ONE TOUCH ULTRA MINI) w/Device KIT, 1 each by Does not apply route 4 (four) times daily -  before meals and at bedtime., Disp: 1 kit, Rfl: 0 .    Cholecalciferol (VITAMIN D-3 PO), Take 1 capsule by mouth daily. 2000 units, Disp: , Rfl:  .  Continuous Blood Gluc Receiver (FREESTYLE LIBRE 14 DAY READER) DEVI, USE TO CHECK BLOOD SUGARS FOUR TIMES A DAY DX:E11.65, Disp: 1 each, Rfl: 1 .  Continuous Blood Gluc Sensor (FREESTYLE LIBRE 2 SENSOR) MISC, USE AS DIRECTED every 14 DAYS, Disp: 3 each, Rfl: 1 .  glucose blood test strip, Use as instructed, Disp: 100 each, Rfl: 5 .  glucose blood test strip, Use as instructed to check blood sugar 3 times a day. Dx code e11.65,  Disp: 100 each, Rfl: 12 .  insulin degludec (TRESIBA FLEXTOUCH) 100 UNIT/ML FlexTouch Pen, Inject 0.21 mLs (21 Units total) into the skin daily., Disp: 15 pen, Rfl: 1 .  Lancets (ONETOUCH ULTRASOFT) lancets, Use as instructed to check blood sugars 4 times daily E11.9, Disp: 300 each, Rfl: 3 .  lisinopril (ZESTRIL) 10 MG tablet, Take 1 tablet (10 mg total) by mouth daily., Disp: 90 tablet, Rfl: 0 .  metFORMIN (GLUCOPHAGE XR) 750 MG 24 hr tablet, Take 1 tablet (750 mg total) by mouth daily with breakfast., Disp: 90 tablet, Rfl: 0 .  Semaglutide,0.25 or 0.5MG/DOS, (OZEMPIC, 0.25 OR 0.5 MG/DOSE,) 2 MG/1.5ML SOPN, Inject 0.5 mg into the skin once a week., Disp: 9.5 mL, Rfl: 1   No Known Allergies   Review of Systems  Constitutional: Negative.  Negative for fatigue.  Respiratory: Negative.   Cardiovascular: Negative.   Gastrointestinal: Negative.   Endocrine: Negative for polydipsia, polyphagia and polyuria.  Neurological: Negative.  Negative for dizziness.  Psychiatric/Behavioral: Negative for confusion. The patient is not nervous/anxious.      Today's Vitals   08/22/20 1154  BP: 132/78  Pulse: 75  Temp: 97.9 F (36.6 C)  TempSrc: Oral  Weight: 153 lb 3.2 oz (69.5 kg)  Height: 5' 2.4" (1.585 m)   Body mass index is 27.66 kg/m.   Objective:  Physical Exam Vitals reviewed.  Constitutional:      General: She is not in acute distress.    Appearance: Normal appearance. She is well-developed.  Cardiovascular:     Rate and Rhythm: Normal rate and regular rhythm.     Pulses: Normal pulses.     Heart sounds: Normal heart sounds. No murmur heard.   Pulmonary:     Effort: Pulmonary effort is normal. No respiratory distress.     Breath sounds: Normal breath sounds. No wheezing.  Chest:     Chest wall: No tenderness.  Musculoskeletal:        General: Normal range of motion.  Skin:    General: Skin is warm and dry.     Capillary Refill: Capillary refill takes less than 2 seconds.   Neurological:     General: No focal deficit present.     Mental Status: She is alert and oriented to person, place, and time.  Psychiatric:        Mood and Affect: Mood normal.        Behavior: Behavior normal.        Thought Content: Thought content normal.        Judgment: Judgment normal.         Assessment And Plan:     1. Type 2 diabetes mellitus without complication, with long-term current use of insulin (HCC)  Chronic, improving   Continue with current medications  Encouraged to increase physical activity to at least 150 minutes a week - CMP14+EGFR - Hemoglobin A1c  2. Aortic atherosclerosis (  [place])  Chronic, continue with statin as tolerated - Lipid panel  3. BMI 27.0-27.9,adult  Encouraged to focus on improving BMI to less than 25     Patient was given opportunity to ask questions. Patient verbalized understanding of the plan and was able to repeat key elements of the plan. All questions were answered to their satisfaction.  [name], FNP   I, [name], FNP, have reviewed all documentation for this visit. The documentation on 08/22/20 for the exam, diagnosis, procedures, and orders are all accurate and complete.   THE PATIENT IS ENCOURAGED TO PRACTICE SOCIAL DISTANCING DUE TO THE COVID-19 PANDEMIC.

## 2020-08-23 LAB — CMP14+EGFR
ALT: 31 IU/L (ref 0–32)
AST: 20 IU/L (ref 0–40)
Albumin/Globulin Ratio: 1.9 (ref 1.2–2.2)
Albumin: 4.6 g/dL (ref 3.8–4.8)
Alkaline Phosphatase: 92 IU/L (ref 44–121)
BUN/Creatinine Ratio: 33 — ABNORMAL HIGH (ref 12–28)
BUN: 19 mg/dL (ref 8–27)
Bilirubin Total: 0.3 mg/dL (ref 0.0–1.2)
CO2: 23 mmol/L (ref 20–29)
Calcium: 9.6 mg/dL (ref 8.7–10.3)
Chloride: 104 mmol/L (ref 96–106)
Creatinine, Ser: 0.57 mg/dL (ref 0.57–1.00)
Globulin, Total: 2.4 g/dL (ref 1.5–4.5)
Glucose: 114 mg/dL — ABNORMAL HIGH (ref 65–99)
Potassium: 4.2 mmol/L (ref 3.5–5.2)
Sodium: 142 mmol/L (ref 134–144)
Total Protein: 7 g/dL (ref 6.0–8.5)
eGFR: 100 mL/min/{1.73_m2} (ref >59)

## 2020-08-23 LAB — LIPID PANEL
Chol/HDL Ratio: 2.1 ratio (ref 0.0–4.4)
Cholesterol, Total: 126 mg/dL (ref 100–199)
HDL: 61 mg/dL (ref >39)
LDL Chol Calc (NIH): 49 mg/dL (ref 0–99)
Triglycerides: 79 mg/dL (ref 0–149)
VLDL Cholesterol Cal: 16 mg/dL (ref 5–40)

## 2020-08-23 LAB — HEMOGLOBIN A1C
Est. average glucose Bld gHb Est-mCnc: 151 mg/dL
Hgb A1c MFr Bld: 6.9 % — ABNORMAL HIGH (ref 4.8–5.6)

## 2020-08-29 ENCOUNTER — Other Ambulatory Visit: Payer: Self-pay

## 2020-08-29 ENCOUNTER — Encounter: Payer: Self-pay | Admitting: Neurology

## 2020-08-29 ENCOUNTER — Ambulatory Visit: Payer: HMO | Admitting: Neurology

## 2020-08-29 VITALS — BP 134/81 | HR 89 | Ht 63.0 in | Wt 154.0 lb

## 2020-08-29 DIAGNOSIS — Z9989 Dependence on other enabling machines and devices: Secondary | ICD-10-CM

## 2020-08-29 DIAGNOSIS — G4733 Obstructive sleep apnea (adult) (pediatric): Secondary | ICD-10-CM

## 2020-08-29 NOTE — Progress Notes (Signed)
Subjective:    Patient ID: Shannon [name] is a 65 y.o. female.  HPI     Interim history:   Shannon [name] is a 65 year old right-handed woman with an underlying medical history of hypertension, hyperlipidemia, diabetes, diabetic retinopathy and overweight state, who presents for follow-up consultation of Shannon [name] obstructive sleep apnea after interim testing and starting AutoPap therapy.  The patient is unaccompanied today.  I first met Shannon [name] at the request of Shannon [name] primary care nurse practitioner on 01/06/2020, at which time Shannon [name] reported snoring and excessive daytime somnolence.  Shannon [name] was encouraged to seek evaluation for sleep apnea by Shannon [name] retina specialist who had noted macular edema and felt it could be in part secondary to underlying sleep apnea.  Shannon [name] was advised to proceed with sleep testing.  Shannon [name] had a home sleep test on 03/02/2020 which indicated overall mild sleep apnea with an AHI of 7.7/h, O2 nadir of 89%.  Given Shannon [name] medical history and daytime somnolence reported Shannon [name] was encouraged to start AutoPap therapy.  Set up date was 05/10/20.  Today, 08/29/20: I reviewed Shannon [name] AutoPap compliance data from 07/26/2020 through 08/24/2020, which is a total of 30 days, during which time Shannon [name] used Shannon [name] machine only 14 days with percent use days greater than 4 hours at only 3%, indicating low compliance, average AHI 11.6/h, 95th percentile of pressure at 5.5 cm, leak for the 95th percentile at 13.7 L/min, pressure range of 5 to 11 cm with EPR of 3.  I reviewed Shannon [name] AutoPap compliance data from 06/26/2020 through 07/25/2020, which is a total of 30 days, during which time Shannon [name] used Shannon [name] machine 24 days with percent use days greater than 4 hours at 40%, indicating suboptimal compliance with an average usage of 2 hours and 50 minutes for days on treatment, residual AHI 4.2/h, 95th percentile of pressure at 8.4 cm with a range of 5 to 11 cm, leak on the higher side with a 95th percentile at 17.5 L/min.  Shannon [name] compliance in the month of late  November through late December was similar, percent use days greater than 4 hours was 33%.  Shannon [name] reports that Shannon [name] works third shift.  Shannon [name] tries to keep the mask on at least 4 hours daily.  Shannon [name] generally sleeps between 1 and 6 PM.  Shannon [name] has to be at work at 7 PM and works 12 hours.  Shannon [name] reports that Shannon [name] feels a little better, sleep quality and energy are a little better.  Shannon [name] reports that Shannon [name] had an eye examination and Shannon [name] right eye looks a little better.  I reviewed the note from Dr. Deloria Lair from 08/17/2020.  Shannon [name] is motivated to continue with treatment.  Shannon [name] reports using a nasal mask.  Shannon [name] does breathe at times through Shannon [name] mouth and is wondering if Shannon [name] could try a full facemask.  Shannon [name] talked to [facility] [facility] regarding Shannon [name] low readings messages on Shannon [name] machine and Shannon [name] was instructed to troubleshoot at home by unplugging and restarting.  Shannon [name] still gets the low readings.   The patient's allergies, current medications, family history, past medical history, past social history, past surgical history and problem list were reviewed and updated as appropriate.   Previously:   01/06/20: (Shannon [name]) reports snoring and excessive daytime somnolence.  Shannon [name] works third shift.  I reviewed your office note from 12/15/2019.  Shannon [name] was advised to seek evaluation for sleep apnea by Shannon [name] retina specialist who was concerned that Shannon [name] may have underlying sleep apnea contributing to Shannon [name] macular edema. Shannon [name]  Epworth sleepiness score is 15/24, fatigue severity score is 19 out of 63.  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  Shannon [name] works day shift for many years.  Shannon [name] is planning to retire in December.  Shannon [name] lives alone, Shannon [name] has 2 grandchildren, 57 year old son and 32 year old daughter.  Shannon [name] has woken up from snoring.  Shannon [name] tries to be in bed around noon or 1 PM and sleeps typically till 6 PM.  It is difficult for Shannon [name] to get more than 4 or 5 hours of sleep.  Shannon [name] does  not have to get up to use the bathroom and denies morning headaches.  Shannon [name] limits Shannon [name] caffeine and often only drinks on Shannon [name] day off.  When Shannon [name] does not have to work the next day Shannon [name] sleeps at night, Shannon [name] tends to sleep a little better when Shannon [name] sleeps at night.  Shannon [name] has no pets in the household.  Shannon [name] quit smoking some 15 years ago, drinks alcohol in the form of wine, up to 1/day, admits that it helps Shannon [name] sleep.  Shannon [name] has tried melatonin but does not like to take it.  Shannon [name] Past Medical History Is Significant For: Past Medical History:  Diagnosis Date  . Diabetes mellitus without complication ([place])   . Hypercholesteremia   . Hypertension   . Sleep apnea    Newly diagnosed in September 2021    Shannon [name] Past Surgical History Is Significant For: Past Surgical History:  Procedure Laterality Date  . ABDOMINAL HYSTERECTOMY    . CATARACT EXTRACTION Bilateral   . CESAREAN SECTION    . MYOMECTOMY      Shannon [name] Family History Is Significant For: Family History  Problem Relation Age of Onset  . Hypertension Mother   . Diabetes Mother   . Stroke Mother   . Diabetes Father   . Hypertension Father   . Stroke Father   . Hypertension Son   . Diabetes Son   . Breast cancer Neg Hx     Shannon [name] Social History Is Significant For: Social History   Socioeconomic History  . Marital status: Single    Spouse name: Not on file  . Number of children: Not on file  . Years of education: Not on file  . Highest education level: Not on file  Occupational History  . Not on file  Tobacco Use  . Smoking status: Former Smoker    Quit date: 2006    Years since quitting: 16.1  . Smokeless tobacco: Never Used  . Tobacco comment: quit 10 yrs  Vaping Use  . Vaping Use: Never used  Substance and Sexual Activity  . Alcohol use: Yes    Comment: 1 glass of wine sometimes daily   . Drug use: Never  . Sexual activity: Not on file  Other Topics Concern  . Not on file  Social History Narrative  . Not on file   Social  Determinants of Health   Financial Resource Strain: Medium Risk  . Difficulty of Paying Living Expenses: Somewhat hard  Food Insecurity: No Food Insecurity  . Worried About Charity fundraiser in the Last Year: Never true  . Ran Out of Food in the Last Year: Never true  Transportation Needs: No Transportation Needs  . Lack of Transportation (Medical): No  . Lack of Transportation (Non-Medical): No  Physical Activity: Not on file  Stress: Not on file  Social Connections: Not on file    Shannon [name] Allergies Are:  No Known Allergies:   Shannon [name] Current Medications Are:  Outpatient Encounter Medications as of 08/29/2020  Medication Sig  . atorvastatin (LIPITOR) 20 MG tablet Take 1 tablet (20 mg total) by mouth daily.  . blood glucose meter kit and supplies KIT Dispense based on patient and insurance preference. Use 3 times daily as directed to check blood sugar. Dx code e11.65  . Blood Glucose Monitoring Suppl (ONE TOUCH ULTRA MINI) w/Device KIT 1 each by Does not apply route 4 (four) times daily -  before meals and at bedtime.  . Cholecalciferol (VITAMIN D-3 PO) Take 1 capsule by mouth daily. 2000 units  . Continuous Blood Gluc Receiver (FREESTYLE LIBRE 14 DAY READER) DEVI USE TO CHECK BLOOD SUGARS FOUR TIMES A DAY DX:E11.65  . Continuous Blood Gluc Sensor (FREESTYLE LIBRE 2 SENSOR) MISC USE AS DIRECTED every 14 DAYS  . glucose blood test strip Use as instructed  . glucose blood test strip Use as instructed to check blood sugar 3 times a day. Dx code e11.65  . insulin degludec (TRESIBA FLEXTOUCH) 100 UNIT/ML FlexTouch Pen Inject 0.21 mLs (21 Units total) into the skin daily.  . Lancets (ONETOUCH ULTRASOFT) lancets Use as instructed to check blood sugars 4 times daily E11.9  . lisinopril (ZESTRIL) 10 MG tablet Take 1 tablet (10 mg total) by mouth daily.  . metFORMIN (GLUCOPHAGE XR) 750 MG 24 hr tablet Take 1 tablet (750 mg total) by mouth daily with breakfast.  . Semaglutide,0.25 or 0.5MG/DOS,  (OZEMPIC, 0.25 OR 0.5 MG/DOSE,) 2 MG/1.5ML SOPN Inject 0.5 mg into the skin once a week.   No facility-administered encounter medications on file as of 08/29/2020.  :  Review of Systems:  Out of a complete 14 point review of systems, all are reviewed and negative with the exception of these symptoms as listed below: Review of Systems  Neurological:       RM 1, alone. Here for initial cpap follow up.  Pt states machine telling Shannon [name] Shannon [name] is having low readings. Shannon [name] verified Shannon [name] is using for at least 4 hr per night.  No new symptoms since last seen.    Objective:  Neurological Exam  Physical Exam Physical Examination:   Vitals:   08/29/20 0720  BP: 134/81  Pulse: 89    General Examination: The patient is a very pleasant 65 y.o. female in no acute distress. Shannon [name] appears well-developed and well-nourished and well groomed.    HEENT: Normocephalic, atraumatic, pupils are equal, round and reactive to light, extraocular tracking is well preserved, face is symmetric with normal facial animation.  Neck with good range of motion, no carotid bruits.  Oropharynx exam reveals: mild to moderate mouth dryness, adequate dental hygiene and moderate airway crowding.  Tongue protrudes centrally and palate elevates symmetrically.  Chest: Clear to auscultation without wheezing, rhonchi or crackles noted.  Heart: S1+S2+0, regular and normal without murmurs, rubs or gallops noted.   Abdomen: Soft, non-tender and non-distended.  Extremities: There is no obvious edema in the distal lower extremities bilaterally.   Skin: Warm and dry without trophic changes noted.   Musculoskeletal: exam reveals no obvious joint deformities.  Neurologically:  Mental status: The patient is awake, alert and oriented in all 4 spheres. Shannon [name] immediate and remote memory, attention, language skills and fund of knowledge are appropriate. There is no evidence of aphasia, agnosia, apraxia or anomia. Speech is clear with normal  prosody and enunciation. Thought process  is linear. Mood is normal and affect is normal.  Cranial nerves II - XII are as described above under HEENT exam.  Motor exam: Normal bulk, strength and tone is noted. There is no tremor, Romberg is negative. Fine motor skills and coordination: grossly intact.  Cerebellar testing: No dysmetria or intention tremor. There is no truncal or gait ataxia.  Sensory exam: intact to light touch in the upper and lower extremities.  Gait, station and balance: Shannon [name] stands easily. No veering to one side is noted. No leaning to one side is noted. Posture is age-appropriate and stance is narrow based. Gait shows normal stride length and normal pace. No problems turning are noted.  Assessment and Plan:  In summary, Shannon [name] is a very pleasant 65 year old female  with an underlying medical history of hypertension, hyperlipidemia, diabetes, diabetic retinopathy and overweight state, who presents for follow-up consultation of Shannon [name] obstructive sleep apnea, after starting AutoPap therapy November.  Shannon [name] has had low readings lately in the past month, essentially since the last week of January 2022.  Shannon [name] reports using Shannon [name] machine.  Shannon [name] is advised to talk to Shannon [name] DME company about troubleshooting the machine and possible repair or replacement.  Shannon [name] has had suboptimal compliance in December and January for using the machine more than 4 hours each day.  Shannon [name] works night shift.  Shannon [name] is benefiting from treatment and that Shannon [name] feels that Shannon [name] sleep quality and energy level have improved, Shannon [name] has had an eye examination recently and had some improvement in Shannon [name] macular edema.  Shannon [name] is also being treated for this with Dr. Zadie Rhine.  Shannon [name] is advised to continue to use Shannon [name] AutoPap consistently.  Home sleep test from 03/02/2020 indicated overall mild obstructive sleep apnea with an AHI of 7.7/h, O2 nadir 89%.  Shannon [name] was encouraged to use AutoPap therapy given Shannon [name] eye related complications.  Shannon [name] is  advised to follow-up routinely in this clinic in 3 months to see one of our nurse practitioners.  I answered all Shannon [name] questions today and Shannon [name] was in agreement.   I spent 20 minutes in total face-to-face time and in reviewing records during pre-charting, more than 50% of which was spent in counseling and coordination of care, reviewing test results, reviewing medications and treatment regimen and/or in discussing or reviewing the diagnosis of OSA, the prognosis and treatment options. Pertinent laboratory and imaging test results that were available during this visit with the patient were reviewed by me and considered in my medical decision making (see chart for details).

## 2020-08-29 NOTE — Patient Instructions (Addendum)
It was good to see you again today.  I am glad you are benefiting from AutoPap therapy and that your eye exam is better on the right side.  Please continue to use your AutoPap fully.  At least 4 hours every night are recommended. Please check with your DME company, [employer] regarding the low readings this month.  They may have to give you a new machine if this one is defective.  Please also talk to them about any mask changes you would like to do.  Follow-up routinely in this clinic to see one of our nurse practitioners in 3 months.

## 2020-09-15 ENCOUNTER — Other Ambulatory Visit: Payer: Self-pay | Admitting: Nurse Practitioner

## 2020-09-15 DIAGNOSIS — Z794 Long term (current) use of insulin: Secondary | ICD-10-CM

## 2020-09-16 ENCOUNTER — Telehealth: Payer: Self-pay

## 2020-09-16 NOTE — Chronic Care Management (AMB) (Addendum)
Chronic Care Management Pharmacy Assistant   Name: Shannon [name]  MRN: [id number] DOB: [birth year]  Reason for Encounter: Medication Review/ Medication Coordination   Recent office visits:  08/22/2020- [name], FNP (PCP)  Recent consult visits:  08/29/2020- Dr Rexene Alberts (Neurology)  Hospital visits:  None in previous 6 months   Reviewed chart for medication changes ahead of medication coordination call.  No medication changes indicated:  BP Readings from Last 3 Encounters:  08/29/20 134/81  08/22/20 132/78  05/23/20 126/60    Lab Results  Component Value Date   HGBA1C 6.9 (H) 08/22/2020     Patient obtains medications through Adherence Packaging  30 Days   Last adherence delivery included:  Metformin 750 mg 1 tablet daily (breakfast)  Atorvastatin 20 mg 1 tablet daily(bedtime)  Lisinopril 10 mg 1 tablet daily (breakfast) Freestyle Libre Kit 2 Sensors- Use as directed every 14 days Vitamin D3 2000 units- 1 capsule daily (evening meal)  Patient declined the following medications last month: Tresiba Flextouch - Inject 21 units sq daily-due to receiving a delivery of 5 pens in one box on 08/08/20 and one extra pen leftover.  Patient is due for next adherence delivery on: 09/22/2020 . 09/16/2020- Called patient and reviewed medications and coordinated delivery, no answer, left message to return call.  09/26/2020- Called patient, left message that I will get delivery ready for 09/27/2020  This delivery to include: Metformin 750 mg 1 tablet daily (breakfast)  Atorvastatin 20 mg 1 tablet daily(bedtime)  Lisinopril 10 mg 1 tablet daily (breakfast) Freestyle Libre Kit 2 Sensors- Use as directed every 14 days Vitamin D3 2000 units- 1 capsule daily (evening meal)   No short fill or acute fill needed.  Patient declined the following medications: Tresiba Flextouch - Inject 21 units sq daily-due to receiving a delivery of 5 pens in one box on 08/08/20. Will not be out until  10/19/2020. Onetouch strips or lancets due to patient using Freestyle Libre CGM.  Patient needs refills for- Metformin, Lisinopril, Freestyle sensors, requested from PCP.    Confirmed delivery date of 09/27/2020, advised patient that pharmacy will contact them the morning of delivery.  Medications: Outpatient Encounter Medications as of 09/16/2020  Medication Sig   atorvastatin (LIPITOR) 20 MG tablet TAKE ONE TABLET BY MOUTH EVERY EVENING WITH A MEAL   blood glucose meter kit and supplies KIT Dispense based on patient and insurance preference. Use 3 times daily as directed to check blood sugar. Dx code e11.65   Blood Glucose Monitoring Suppl (ONE TOUCH ULTRA MINI) w/Device KIT 1 each by Does not apply route 4 (four) times daily -  before meals and at bedtime.   Cholecalciferol (VITAMIN D-3 PO) Take 1 capsule by mouth daily. 2000 units   Continuous Blood Gluc Receiver (FREESTYLE LIBRE 14 DAY READER) DEVI USE TO CHECK BLOOD SUGARS FOUR TIMES A DAY DX:E11.65   Continuous Blood Gluc Sensor (FREESTYLE LIBRE 2 SENSOR) MISC USE AS DIRECTED every 14 DAYS   glucose blood test strip Use as instructed   glucose blood test strip Use as instructed to check blood sugar 3 times a day. Dx code e11.65   insulin degludec (TRESIBA FLEXTOUCH) 100 UNIT/ML FlexTouch Pen Inject 0.21 mLs (21 Units total) into the skin daily.   Lancets (ONETOUCH ULTRASOFT) lancets Use as instructed to check blood sugars 4 times daily E11.9   lisinopril (ZESTRIL) 10 MG tablet Take 1 tablet (10 mg total) by mouth daily.   metFORMIN (GLUCOPHAGE XR) 750 MG 24  hr tablet Take 1 tablet (750 mg total) by mouth daily with breakfast.   Semaglutide,0.25 or 0.5MG/DOS, (OZEMPIC, 0.25 OR 0.5 MG/DOSE,) 2 MG/1.5ML SOPN Inject 0.5 mg into the skin once a week.   No facility-administered encounter medications on file as of 09/16/2020.    Star Rating Drugs: Atorvastatin 20 mg- Last filled 08/26/2020 for 30 day supply at [facility]. [facility] FlexTouch- Last  filled 08/05/2020 for 72 day supply at [facility] Lisinopril 10 mg- Last filled 08/26/2020 for 30 day supply at [facility] filled 11/16/2019 for 84 day supply at [facility] Metformin ER 750 mg- Last filled 08/26/2020 for 30 day supply at [facility].   SIG: [name], [place] Pharmacist Assistant [phone]  I have reviewed the care management and care coordination activities outlined in this encounter and I am certifying that I agree with the content of this note. No further action required.  Mayford Knife, [facility] 10/07/20 1:09 PM

## 2020-09-26 ENCOUNTER — Other Ambulatory Visit: Payer: Self-pay

## 2020-09-26 DIAGNOSIS — Z794 Long term (current) use of insulin: Secondary | ICD-10-CM

## 2020-09-26 MED ORDER — FREESTYLE LIBRE 2 SENSOR MISC: 1 refills | Status: DC

## 2020-09-26 MED ORDER — METFORMIN HCL ER 750 MG PO TB24: 750.0000 mg | ORAL_TABLET | Freq: Every day | ORAL | 0 refills | Status: DC

## 2020-09-26 MED ORDER — LISINOPRIL 10 MG PO TABS: 10.0000 mg | ORAL_TABLET | Freq: Every day | ORAL | 0 refills | Status: DC

## 2020-09-27 ENCOUNTER — Telehealth: Payer: Self-pay | Admitting: *Deleted

## 2020-09-27 NOTE — Chronic Care Management (AMB) (Signed)
  Care Management   Note  09/27/2020 Name: Shannon [name] MRN: [id number] DOB: [birth year]  [name] is a 65 y.o. year old female who is a primary care patient of Arnette Felts, FNP and is actively engaged with the care management team. I reached out to Carlton Adam by phone today to assist with scheduling a follow up visit with the Pharmacist  Follow up plan: Unsuccessful telephone outreach attempt made. A HIPAA compliant phone message was left for the patient providing contact information and requesting a return call.  The care management team will reach out to the patient again over the next 7 days.  If patient returns call to provider office, please advise to call Embedded Care Management Care Guide Gwenevere Ghazi at [phone].  Gwenevere Ghazi  Care Guide, Embedded Care Coordination [facility] Management

## 2020-09-28 ENCOUNTER — Encounter (INDEPENDENT_AMBULATORY_CARE_PROVIDER_SITE_OTHER): Payer: HMO | Admitting: Ophthalmology

## 2020-09-30 DIAGNOSIS — G4733 Obstructive sleep apnea (adult) (pediatric): Secondary | ICD-10-CM | POA: Diagnosis not present

## 2020-09-30 NOTE — Chronic Care Management (AMB) (Signed)
  Care Management   Note  09/30/2020 Name: Shannon [name] MRN: [id number] DOB: [birth year]  Shannon [name] is a 65 y.o. year old female who is a primary care patient of Arnette Felts, FNP and is actively engaged with the care management team. I reached out to Carlton Adam by phone today to assist with scheduling a follow up visit with the Pharmacist  Follow up plan: Telephone appointment with care management team member scheduled for:10/13/2020  [facility] Guide, Embedded Care Coordination [facility]  Care Management

## 2020-10-05 ENCOUNTER — Ambulatory Visit (INDEPENDENT_AMBULATORY_CARE_PROVIDER_SITE_OTHER): Payer: HMO | Admitting: Ophthalmology

## 2020-10-05 ENCOUNTER — Encounter (INDEPENDENT_AMBULATORY_CARE_PROVIDER_SITE_OTHER): Payer: Self-pay | Admitting: Ophthalmology

## 2020-10-05 ENCOUNTER — Other Ambulatory Visit: Payer: Self-pay

## 2020-10-05 DIAGNOSIS — E113311 Type 2 diabetes mellitus with moderate nonproliferative diabetic retinopathy with macular edema, right eye: Secondary | ICD-10-CM | POA: Diagnosis not present

## 2020-10-05 DIAGNOSIS — H35071 Retinal telangiectasis, right eye: Secondary | ICD-10-CM | POA: Diagnosis not present

## 2020-10-05 DIAGNOSIS — G4730 Sleep apnea, unspecified: Secondary | ICD-10-CM

## 2020-10-05 MED ORDER — BEVACIZUMAB 2.5 MG/0.1ML IZ SOSY
2.5000 mg | PREFILLED_SYRINGE | INTRAVITREAL | Status: AC | PRN
  Administered 2020-10-05: 2.5 mg via INTRAVITREAL

## 2020-10-05 NOTE — Assessment & Plan Note (Signed)
CSME now improving temporal edge of macula, in combination with Avastin as well as nightly use of CPAP.  Improvement in sleep apnea decreases nightly hypoxic and hypertensive damage to the macular perfusion of each eye

## 2020-10-05 NOTE — Assessment & Plan Note (Signed)
Patient still compliant with CPAP since October 2021

## 2020-10-05 NOTE — Progress Notes (Signed)
10/05/2020     CHIEF COMPLAINT Patient presents for Retina Follow Up (7 week fu OD. Possible Avastin OD/Pt states [facility] OU stable since last visit. Pt denies FOL, floaters, or ocular pain OU. //LBS: 95 (this am)/A1C: 6.9 ( couple of months ago))   HISTORY OF PRESENT ILLNESS: Shannon [name] is a 65 y.o. female who presents to the clinic today for:   HPI    Retina Follow Up    Patient presents with  Diabetic Retinopathy.  In right eye.  This started 7 weeks ago.  Duration of 7 weeks. Additional comments: 7 week fu OD. Possible Avastin OD Pt states VA OU stable since last visit. Pt denies FOL, floaters, or ocular pain OU.   LBS: 95 (this am) A1C: 6.9 ( couple of months ago)       Last edited by Kendra Opitz, COA on 10/05/2020  9:19 AM. (History)      Referring physician: [name], [address] [place],  [place] [address]  HISTORICAL INFORMATION:   Selected notes from the MEDICAL RECORD NUMBER    Lab Results  Component Value Date   HGBA1C 6.9 (H) 08/22/2020     CURRENT MEDICATIONS: No current outpatient medications on file. (Ophthalmic Drugs)   No current facility-administered medications for this visit. (Ophthalmic Drugs)   Current Outpatient Medications (Other)  Medication Sig  . atorvastatin (LIPITOR) 20 MG tablet TAKE ONE TABLET BY MOUTH EVERY EVENING WITH A MEAL  . blood glucose meter kit and supplies KIT Dispense based on patient and insurance preference. Use 3 times daily as directed to check blood sugar. Dx code e11.65  . Blood Glucose Monitoring Suppl (ONE TOUCH ULTRA MINI) w/Device KIT 1 each by Does not apply route 4 (four) times daily -  before meals and at bedtime.  . Cholecalciferol (VITAMIN D-3 PO) Take 1 capsule by mouth daily. 2000 units  . Continuous Blood Gluc Receiver (FREESTYLE LIBRE 14 DAY READER) DEVI USE TO CHECK BLOOD SUGARS FOUR TIMES A DAY DX:E11.65  . Continuous Blood Gluc Sensor (FREESTYLE LIBRE 2 SENSOR) MISC Use to  check blood sugars 4 times daily E11.65  . glucose blood test strip Use as instructed  . glucose blood test strip Use as instructed to check blood sugar 3 times a day. Dx code e11.65  . insulin degludec (TRESIBA FLEXTOUCH) 100 UNIT/ML FlexTouch Pen Inject 0.21 mLs (21 Units total) into the skin daily.  . Lancets (ONETOUCH ULTRASOFT) lancets Use as instructed to check blood sugars 4 times daily E11.9  . lisinopril (ZESTRIL) 10 MG tablet Take 1 tablet (10 mg total) by mouth daily.  . metFORMIN (GLUCOPHAGE XR) 750 MG 24 hr tablet Take 1 tablet (750 mg total) by mouth daily with breakfast.  . Semaglutide,0.25 or 0.5MG/DOS, (OZEMPIC, 0.25 OR 0.5 MG/DOSE,) 2 MG/1.5ML SOPN Inject 0.5 mg into the skin once a week.   No current facility-administered medications for this visit. (Other)      REVIEW OF SYSTEMS:    ALLERGIES No Known Allergies  PAST MEDICAL HISTORY Past Medical History:  Diagnosis Date  . Diabetes mellitus without complication ([place])   . Hypercholesteremia   . Hypertension   . Sleep apnea    Newly diagnosed in September 2021   Past Surgical History:  Procedure Laterality Date  . ABDOMINAL HYSTERECTOMY    . CATARACT EXTRACTION Bilateral   . CESAREAN SECTION    . MYOMECTOMY      FAMILY HISTORY Family History  Problem  Relation Age of Onset  . Hypertension Mother   . Diabetes Mother   . Stroke Mother   . Diabetes Father   . Hypertension Father   . Stroke Father   . Hypertension Son   . Diabetes Son   . Breast cancer Neg Hx     SOCIAL HISTORY Social History   Tobacco Use  . Smoking status: Former Smoker    Quit date: 2006    Years since quitting: 16.2  . Smokeless tobacco: Never Used  . Tobacco comment: quit 10 yrs  Vaping Use  . Vaping Use: Never used  Substance Use Topics  . Alcohol use: Yes    Comment: 1 glass of wine sometimes daily   . Drug use: Never         OPHTHALMIC EXAM: Base Eye Exam    Visual Acuity (ETDRS)      Right Left   Dist  [place] 20/25 20/25 +1       Tonometry (Tonopen, 9:24 AM)      Right Left   Pressure 13 13       Pupils      Pupils Dark Light Shape React APD   Right PERRL 3 2 Round Brisk None   Left PERRL 3 2 Round Brisk None       Visual Fields (Counting fingers)      Left Right    Full Full       Neuro/Psych    Oriented x3: Yes   Mood/Affect: Normal       Dilation    Right eye: 1.0% Mydriacyl, 2.5% Phenylephrine @ 9:24 AM        Slit Lamp and Fundus Exam    External Exam      Right Left   External Normal Normal       Slit Lamp Exam      Right Left   Lids/Lashes Normal Normal   Conjunctiva/Sclera White and quiet White and quiet   Cornea Clear Clear   Anterior Chamber Deep and quiet Deep and quiet   Iris Round and reactive Round and reactive   Lens Posterior chamber intraocular lens Posterior chamber intraocular lens   Anterior Vitreous Normal Normal       Fundus Exam      Right Left   Posterior Vitreous Normal    Disc Normal    C/D Ratio 0.2    Macula Clinically significant macular edema, Exudates, Macular thickening, Mild clinically significant macular edema    Vessels NPDR- Moderate    Periphery Normal           IMAGING AND PROCEDURES  Imaging and Procedures for 10/05/20  OCT, Retina - OU - Both Eyes       Right Eye Quality was good. Scan locations included subfoveal. Central Foveal Thickness: 295. Progression has improved. Findings include normal foveal contour.   Left Eye Quality was good. Scan locations included subfoveal. Central Foveal Thickness: 296. Findings include normal foveal contour.   Notes CSME OD, now responsive to intravitreal Avastin since patient has commence therapy of sleep apnea and its potential hypoxic damage nightly to macular perfusion.  Currently at 6-week follow-up.  We will repeat injection today and examination again in 8 weeks       Intravitreal Injection, Pharmacologic Agent - OD - Right Eye       Time Out 10/05/2020.  10:16 AM. Confirmed correct patient, procedure, site, and patient consented.   Anesthesia Topical anesthesia was used. Anesthetic medications included [place]  3.5%.   Procedure Preparation included Tobramycin 0.3%, 5% betadine to ocular surface, 10% betadine to eyelids. A 30 gauge needle was used.   Injection:  2.5 mg Bevacizumab (AVASTIN) 2.46m/0.1mL SOSY   NDC:: 42595-638-75 Lot:: [id number]  Route: Intravitreal, Site: Right Eye  Post-op Post injection exam found visual acuity of at least counting fingers. The patient tolerated the procedure well. There were no complications. The patient received written and verbal post procedure care education. Post injection medications were not given.                 ASSESSMENT/PLAN:  Type 2 macular telangiectasis of right eye Patient still compliant with CPAP since October 2021  Sleep apnea in adult Patient still compliant, has some trouble with low scores at times  Moderate nonproliferative diabetic retinopathy of right eye with macular edema (HCC) CSME now improving temporal edge of macula, in combination with Avastin as well as nightly use of CPAP.  Improvement in sleep apnea decreases nightly hypoxic and hypertensive damage to the macular perfusion of each eye      ICD-10-CM   1. Moderate nonproliferative diabetic retinopathy of right eye with macular edema associated with type 2 diabetes mellitus (HCC)  E11.3311 OCT, Retina - OU - Both Eyes    Intravitreal Injection, Pharmacologic Agent - OD - Right Eye    bevacizumab (AVASTIN) SOSY 2.5 mg  2. Type 2 macular telangiectasis of right eye  H35.071   3. Sleep apnea in adult  G47.30     1.  CSME component of macular disease right eye continues to improve today at 6-week post Avastin will repeat injection today and examination next in 8 weeks  2.  3.  Ophthalmic Meds Ordered this visit:  Meds ordered this encounter  Medications  . bevacizumab (AVASTIN) SOSY 2.5 mg        Return in about 8 weeks (around 11/30/2020) for dilate, OD, AVASTIN OCT.  There are no Patient Instructions on file for this visit.   Explained the diagnoses, plan, and follow up with the patient and they expressed understanding.  Patient expressed understanding of the importance of proper follow up care.   GClent DemarkRankin M.D. Diseases & Surgery of the Retina and Vitreous Retina & Diabetic [place]04/13/22     Abbreviations: M myopia (nearsighted); A astigmatism; H hyperopia (farsighted); P presbyopia; Mrx spectacle prescription;  CTL contact lenses; OD right eye; OS left eye; OU both eyes  XT exotropia; ET esotropia; PEK punctate epithelial keratitis; PEE punctate epithelial erosions; DES dry eye syndrome; MGD meibomian gland dysfunction; ATs artificial tears; PFAT's preservative free artificial tears; NPanorama Villagenuclear sclerotic cataract; PSC posterior subcapsular cataract; ERM epi-retinal membrane; PVD posterior vitreous detachment; RD retinal detachment; DM diabetes mellitus; DR diabetic retinopathy; NPDR non-proliferative diabetic retinopathy; PDR proliferative diabetic retinopathy; CSME clinically significant macular edema; DME diabetic macular edema; dbh dot blot hemorrhages; CWS cotton wool spot; POAG primary open angle glaucoma; C/D cup-to-disc ratio; HVF humphrey visual field; GVF goldmann visual field; OCT optical coherence tomography; IOP intraocular pressure; BRVO Branch retinal vein occlusion; CRVO central retinal vein occlusion; CRAO central retinal artery occlusion; BRAO branch retinal artery occlusion; RT retinal tear; SB scleral buckle; PPV pars plana vitrectomy; VH Vitreous hemorrhage; PRP panretinal laser photocoagulation; IVK intravitreal kenalog; VMT vitreomacular traction; MH Macular hole;  NVD neovascularization of the disc; NVE neovascularization elsewhere; AREDS age related eye disease study; ARMD age related macular degeneration; POAG primary open angle glaucoma; EBMD  epithelial/anterior basement membrane dystrophy; ACIOL  anterior chamber intraocular lens; IOL intraocular lens; PCIOL posterior chamber intraocular lens; Phaco/IOL phacoemulsification with intraocular lens placement; [place] photorefractive keratectomy; LASIK laser assisted in situ keratomileusis; HTN hypertension; DM diabetes mellitus; COPD chronic obstructive pulmonary disease

## 2020-10-05 NOTE — Assessment & Plan Note (Signed)
Patient still compliant, has some trouble with low scores at times

## 2020-10-06 ENCOUNTER — Other Ambulatory Visit: Payer: Self-pay | Admitting: Nurse Practitioner

## 2020-10-06 DIAGNOSIS — E119 Type 2 diabetes mellitus without complications: Secondary | ICD-10-CM

## 2020-10-06 DIAGNOSIS — Z794 Long term (current) use of insulin: Secondary | ICD-10-CM

## 2020-10-11 NOTE — Progress Notes (Incomplete)
Chronic Care Management Pharmacy Note  10/11/2020 Name:  Reighn Kaplan MRN:  [id number] DOB:  1956/06/14  Subjective: Dody Smartt is an 65 y.o. year old female who is a primary patient of [name], [place].  The CCM team was consulted for assistance with disease management and care coordination needs.    {CCMTELEPHONEFACETOFACE:21091510} for {CCMINITIALFOLLOWUPCHOICE:21091511} in response to provider referral for pharmacy case management and/or care coordination services.   Consent to Services:  {CCMCONSENTOPTIONS:25074}  Patient Care Team: [name], FNP as PCP - General (General Practice) Mayford Knife, [facility] (Pharmacist)  Recent office visits: ***  Recent consult visits: [facility] visits: {Hospital DC Yes/No:25215}  Objective:  Lab Results  Component Value Date   CREATININE 0.57 08/22/2020   BUN 19 08/22/2020   GFRNONAA 93 05/23/2020   GFRAA 107 05/23/2020   NA 142 08/22/2020   K 4.2 08/22/2020   CALCIUM 9.6 08/22/2020   CO2 23 08/22/2020   GLUCOSE 114 (H) 08/22/2020    Lab Results  Component Value Date/Time   HGBA1C 6.9 (H) 08/22/2020 03:53 PM   HGBA1C 6.7 (H) 05/23/2020 11:39 AM   MICROALBUR 80 08/11/2019 10:29 AM    Last diabetic Eye exam:  Lab Results  Component Value Date/Time   HMDIABEYEEXA Retinopathy (A) 09/14/2019 12:00 AM    Last diabetic Foot exam: No results found for: HMDIABFOOTEX   Lab Results  Component Value Date   CHOL 126 08/22/2020   HDL 61 08/22/2020   LDLCALC 49 08/22/2020   TRIG 79 08/22/2020   CHOLHDL 2.1 08/22/2020    Hepatic Function Latest Ref Rng & Units 08/22/2020 05/23/2020 02/16/2020  Total Protein 6.0 - 8.5 g/dL 7.0 6.7 7.2  Albumin 3.8 - 4.8 g/dL 4.6 4.4 4.9(H)  AST 0 - 40 IU/L _0 ALT 0 - 32 IU/L _1 Alk Phosphatase 44 - 121 IU/L 92 80 98  Total Bilirubin 0.0 - 1.2 mg/dL 0.3 0.3 0.3    Lab Results  Component Value Date/Time   TSH 1.700 11/16/2019 03:50 PM   TSH  0.797 08/29/2018 10:37 AM    CBC Latest Ref Rng & Units 11/16/2019 10/18/2018 10/17/2018  WBC 3.4 - 10.8 x10E3/uL 5.8 6.5 8.7  Hemoglobin 11.1 - 15.9 g/dL 11.2 11.7(L) 11.7(L)  Hematocrit 34.0 - 46.6 % 33.9(L) 36.0 35.0(L)  Platelets 150 - 450 x10E3/uL 213 204 181    No results found for: VD25OH  Clinical ASCVD: {YES/NO:21197} The ASCVD Risk score Mikey Bussing DC Jr., et al., 2013) failed to calculate for the following reasons:   The valid total cholesterol range is 130 to 320 mg/dL    Depression screen [facility] 2/9 04/20/2020 11/16/2019 07/23/2019  Decreased Interest 0 0 0  Down, Depressed, Hopeless 0 0 0  PHQ - 2 Score 0 0 0  Altered sleeping - 0 -  Tired, decreased energy - 0 -  Change in appetite - 0 -  Feeling bad or failure about yourself  - 0 -  Trouble concentrating - 0 -  Moving slowly or fidgety/restless - 0 -  Suicidal thoughts - 0 -  PHQ-9 Score - 0 -  Difficult doing work/chores - Not difficult at all -     ***Other: (CHADS2VASc if Afib, MMRC or CAT for COPD, ACT, DEXA)  Social History   Tobacco Use  Smoking Status Former Smoker  . Quit date: 2006  . Years since quitting: 16.3  Smokeless Tobacco Never Used  Tobacco Comment   quit 10  yrs   BP Readings from Last 3 Encounters:  08/29/20 134/81  08/22/20 132/78  05/23/20 126/60   Pulse Readings from Last 3 Encounters:  08/29/20 89  08/22/20 75  05/23/20 70   Wt Readings from Last 3 Encounters:  08/29/20 154 lb (69.9 kg)  08/22/20 153 lb 3.2 oz (69.5 kg)  05/23/20 151 lb 3.2 oz (68.6 kg)   BMI Readings from Last 3 Encounters:  08/29/20 27.28 kg/m  08/22/20 27.66 kg/m  05/23/20 27.30 kg/m    Assessment/Interventions: Review of patient past medical history, allergies, medications, health status, including review of consultants reports, laboratory and other test data, was performed as part of comprehensive evaluation and provision of chronic care management services.   SDOH:  (Social Determinants of Health)  assessments and interventions performed: {yes/no:20286}  SDOH Screenings   Alcohol Screen: Not on file  Depression (PHQ2-9): Low Risk   . PHQ-2 Score: 0  Financial Resource Strain: Medium Risk  . Difficulty of Paying Living Expenses: Somewhat hard  Food Insecurity: No Food Insecurity  . Worried About Charity fundraiser in the Last Year: Never true  . Ran Out of Food in the Last Year: Never true  Housing: Not on file  Physical Activity: Not on file  Social Connections: Not on file  Stress: Not on file  Tobacco Use: Medium Risk  . Smoking Tobacco Use: Former Smoker  . Smokeless Tobacco Use: Never Used  Transportation Needs: No Transportation Needs  . Lack of Transportation (Medical): No  . Lack of Transportation (Non-Medical): No    CCM Care Plan  No Known Allergies  Medications Reviewed Today    Reviewed by Kendra Opitz, COA (Technician) on 10/05/20 at [place] List Status: <None>  Medication Order Taking? Sig Documenting Provider Last Dose Status Informant  atorvastatin (LIPITOR) 20 MG tablet [id number]  TAKE ONE TABLET BY MOUTH EVERY EVENING WITH A MEAL [name], FNP  Active   blood glucose meter kit and supplies KIT [id number] No Dispense based on patient and insurance preference. Use 3 times daily as directed to check blood sugar. Dx code e11.65 [name], FNP Taking Active   Blood Glucose Monitoring Suppl (ONE TOUCH ULTRA MINI) w/Device KIT [id number] No 1 each by Does not apply route 4 (four) times daily -  before meals and at bedtime. [name], FNP Taking Active   Cholecalciferol (VITAMIN D-3 PO) [id number] No Take 1 capsule by mouth daily. 2000 units [provider] Taking Active Self  Continuous Blood Gluc Receiver (FREESTYLE LIBRE 14 DAY READER) DEVI [id number] No USE TO CHECK BLOOD SUGARS FOUR TIMES A DAY DX:E11.65 [name], FNP Taking Active   Continuous Blood Gluc Sensor (FREESTYLE LIBRE 2 SENSOR) [place] [id number]  Use to check blood sugars 4  times daily E11.65 [name], FNP  Active   glucose blood test strip [id number] No Use as instructed [name], FNP Taking Active   glucose blood test strip [id number] No Use as instructed to check blood sugar 3 times a day. Dx code e11.65 [name], FNP Taking Active   insulin degludec (TRESIBA FLEXTOUCH) 100 UNIT/ML FlexTouch Pen [id number] No Inject 0.21 mLs (21 Units total) into the skin daily. [name], FNP Taking Active   Lancets [facility] ULTRASOFT) lancets [id number] No Use as instructed to check blood sugars 4 times daily E11.9 [name], FNP Taking Active   lisinopril (ZESTRIL) 10 MG tablet [id number]  Take 1 tablet (10 mg total) by mouth daily. [name], FNP  Active   metFORMIN (GLUCOPHAGE XR) 750 MG 24 hr tablet [id number]  Take 1 tablet (750 mg total) by mouth daily with breakfast. [name], FNP  Active   Semaglutide,0.25 or 0.5MG/DOS, (OZEMPIC, 0.25 OR 0.5 MG/DOSE,) 2 MG/1.5ML SOPN [id number] No Inject 0.5 mg into the skin once a week. [name], FNP Taking Active           Patient Active Problem List   Diagnosis Date Noted  . Hypertensive retinopathy of both eyes 07/06/2020  . Type 2 macular telangiectasis of right eye 05/04/2020  . Sleep apnea in adult 03/16/2020  . Vitreous floaters of left eye 12/29/2019  . Other long term (current) drug therapy 11/16/2019  . Snores 10/27/2019  . Moderate nonproliferative diabetic retinopathy of right eye with macular edema ([place]) 10/06/2019  . Moderate nonproliferative diabetic retinopathy of left eye with macular edema associated with type 2 diabetes mellitus ([place]) 10/06/2019  . Retinal exudates and deposits 10/06/2019  . Cough 11/20/2018  . Other fatigue 11/20/2018  . COVID-19 virus infection 10/13/2018  . Hypokalemia 10/13/2018  . Pyelonephritis 06/17/2018    Immunization History  Administered Date(s) Administered  . Fluad Quad(high Dose 65+) 03/22/2020  . Influenza,inj,Quad PF,6+ Mos 04/18/2018  .  Influenza-Unspecified 05/01/2019  . PFIZER(Purple Top)SARS-COV-2 Vaccination 09/18/2019, 10/14/2019, 04/19/2020  . Pneumococcal Polysaccharide-23 05/30/2018  . Tdap 11/28/2017    Conditions to be addressed/monitored:  {USCCMDZASSESSMENTOPTIONS:23563}  There are no care plans that you recently modified to display for this patient.    Medication Assistance: {MEDASSISTANCEINFO:25044}  Patient's preferred pharmacy is:  [facility] - [place], [place] - [address] [place] [address] Phone: [phone] Fax: [phone]  Uses pill box? {Yes or If no, why not?:20788} Pt endorses ***% compliance  We discussed: {Pharmacy options:24294} Patient decided to: {US Pharmacy Plan:23885}  Care Plan and Follow Up Patient Decision:  {FOLLOWUP:24991}  Plan: {CM FOLLOW UP PLAN:25073}  ***   Current Barriers:  . {pharmacybarriers:24917} . ***  Pharmacist Clinical Goal(s):  Marland Kitchen Patient will {PHARMACYGOALCHOICES:24921} through collaboration with PharmD and provider.  . ***  Interventions: . 1:1 collaboration with [name], FNP regarding development and update of comprehensive plan of care as evidenced by provider attestation and co-signature . Inter-disciplinary care team collaboration (see longitudinal plan of care) . Comprehensive medication review performed; medication list updated in electronic medical record  {CCM PHARMD DISEASE STATES:25130}  Patient Goals/Self-Care Activities . Patient will:  - {pharmacypatientgoals:24919}  Follow Up Plan: {CM FOLLOW UP IBBC:48889}

## 2020-10-12 ENCOUNTER — Telehealth: Payer: Self-pay

## 2020-10-12 NOTE — Chronic Care Management (AMB) (Signed)
Called and spoke with the patient reminding her of upcoming CCM Call appointment on 10/13/20 at 3:45PM with Cherylin Mylar, CPP. The patient was made aware to have medications and supplements nearby during phone call with CPP, Cherylin Mylar. Patient verbalized understanding.   Lura Em Clinical Pharmacist Assistant [phone]

## 2020-10-13 ENCOUNTER — Telehealth: Payer: HMO

## 2020-10-18 ENCOUNTER — Telehealth: Payer: Self-pay

## 2020-10-18 NOTE — Chronic Care Management (AMB) (Signed)
Chronic Care Management Pharmacy Assistant   Name: Shannon [name]  MRN: [id number] DOB: 07/07/1955   Reason for Encounter: Appointment Reminder   Recent office visits:  08/22/20-Janece Moore, FNP(PCP)  Recent consult visits:  None noted  Hospital visits:  None in previous 6 months  Medications: Outpatient Encounter Medications as of 10/18/2020  Medication Sig  . atorvastatin (LIPITOR) 20 MG tablet TAKE ONE TABLET BY MOUTH EVERY EVENING WITH A MEAL  . blood glucose meter kit and supplies KIT Dispense based on patient and insurance preference. Use 3 times daily as directed to check blood sugar. Dx code e11.65  . Blood Glucose Monitoring Suppl (ONE TOUCH ULTRA MINI) w/Device KIT 1 each by Does not apply route 4 (four) times daily -  before meals and at bedtime.  . Cholecalciferol (VITAMIN D-3 PO) Take 1 capsule by mouth daily. 2000 units  . Continuous Blood Gluc Receiver (FREESTYLE LIBRE 14 DAY READER) DEVI USE TO CHECK BLOOD SUGARS FOUR TIMES A DAY DX:E11.65  . Continuous Blood Gluc Sensor (FREESTYLE LIBRE 2 SENSOR) MISC Use to check blood sugars 4 times daily E11.65  . glucose blood test strip Use as instructed  . glucose blood test strip Use as instructed to check blood sugar 3 times a day. Dx code e11.65  . Lancets (ONETOUCH ULTRASOFT) lancets Use as instructed to check blood sugars 4 times daily E11.9  . lisinopril (ZESTRIL) 10 MG tablet Take 1 tablet (10 mg total) by mouth daily.  . metFORMIN (GLUCOPHAGE XR) 750 MG 24 hr tablet Take 1 tablet (750 mg total) by mouth daily with breakfast.  . Semaglutide,0.25 or 0.5MG/DOS, (OZEMPIC, 0.25 OR 0.5 MG/DOSE,) 2 MG/1.5ML SOPN Inject 0.5 mg into the skin once a week.  . TRESIBA FLEXTOUCH 100 UNIT/ML FlexTouch Pen INJECT 21 UNITS (0.21mls) into THE SKIN DAILY   No facility-administered encounter medications on file as of 10/18/2020.    Left voicemail to remind patient of CCM appointment with Vallie Pearson on 10/19/20 at  3:30PM.    Jessica Hendricks,CMA Clinical Pharmacist Assistant [phone]   

## 2020-10-19 ENCOUNTER — Ambulatory Visit (INDEPENDENT_AMBULATORY_CARE_PROVIDER_SITE_OTHER): Payer: HMO

## 2020-10-19 DIAGNOSIS — E119 Type 2 diabetes mellitus without complications: Secondary | ICD-10-CM

## 2020-10-19 DIAGNOSIS — I10 Essential (primary) hypertension: Secondary | ICD-10-CM

## 2020-10-19 DIAGNOSIS — E782 Mixed hyperlipidemia: Secondary | ICD-10-CM | POA: Diagnosis not present

## 2020-10-19 DIAGNOSIS — Z794 Long term (current) use of insulin: Secondary | ICD-10-CM | POA: Diagnosis not present

## 2020-10-19 NOTE — Progress Notes (Signed)
Chronic Care Management Pharmacy Note  10/21/2020 Name:  Shannon [name] MRN:  [id number] DOB:  1956-01-07  Subjective: Shannon [name] is an 65 y.o. year old female who is a primary patient of [name], [place].  The CCM team was consulted for assistance with disease management and care coordination needs.    Engaged with patient face to face for follow up visit in response to provider referral for pharmacy case management and/or care coordination services.   Consent to Services:  The patient was given information about Chronic Care Management services, agreed to services, and gave verbal consent prior to initiation of services.  Please see initial visit note for detailed documentation.   Patient Care Team: [name], FNP as PCP - General (General Practice) Mayford Knife, [facility] (Pharmacist)  Recent office visits: 08/22/2020 PCP OV   Recent consult visits: 08/29/2020 OSA OV  07/06/2020- Retina specialist [facility] visits: None in previous 6 months  Objective:  Lab Results  Component Value Date   CREATININE 0.57 08/22/2020   BUN 19 08/22/2020   GFRNONAA 93 05/23/2020   GFRAA 107 05/23/2020   NA 142 08/22/2020   K 4.2 08/22/2020   CALCIUM 9.6 08/22/2020   CO2 23 08/22/2020   GLUCOSE 114 (H) 08/22/2020    Lab Results  Component Value Date/Time   HGBA1C 6.9 (H) 08/22/2020 03:53 PM   HGBA1C 6.7 (H) 05/23/2020 11:39 AM   MICROALBUR 80 08/11/2019 10:29 AM    Last diabetic Eye exam:  Lab Results  Component Value Date/Time   HMDIABEYEEXA Retinopathy (A) 09/14/2019 12:00 AM    Last diabetic Foot exam: No results found for: HMDIABFOOTEX   Lab Results  Component Value Date   CHOL 126 08/22/2020   HDL 61 08/22/2020   LDLCALC 49 08/22/2020   TRIG 79 08/22/2020   CHOLHDL 2.1 08/22/2020    Hepatic Function Latest Ref Rng & Units 08/22/2020 05/23/2020 02/16/2020  Total Protein 6.0 - 8.5 g/dL 7.0 6.7 7.2  Albumin 3.8 - 4.8 g/dL 4.6 4.4 4.9(H)   AST 0 - 40 IU/L 20 12 18   ALT 0 - 32 IU/L 31 21 21   Alk Phosphatase 44 - 121 IU/L 92 80 98  Total Bilirubin 0.0 - 1.2 mg/dL 0.3 0.3 0.3    Lab Results  Component Value Date/Time   TSH 1.700 11/16/2019 03:50 PM   TSH 0.797 08/29/2018 10:37 AM    CBC Latest Ref Rng & Units 11/16/2019 10/18/2018 10/17/2018  WBC 3.4 - 10.8 x10E3/uL 5.8 6.5 8.7  Hemoglobin 11.1 - 15.9 g/dL 11.2 11.7(L) 11.7(L)  Hematocrit 34.0 - 46.6 % 33.9(L) 36.0 35.0(L)  Platelets 150 - 450 x10E3/uL 213 204 181    No results found for: VD25OH  Clinical ASCVD: Yes  The ASCVD Risk score Mikey Bussing DC Jr., et al., 2013) failed to calculate for the following reasons:   The valid total cholesterol range is 130 to 320 mg/dL    Depression screen [facility] 2/9 04/20/2020 11/16/2019 07/23/2019  Decreased Interest 0 0 0  Down, Depressed, Hopeless 0 0 0  PHQ - 2 Score 0 0 0  Altered sleeping - 0 -  Tired, decreased energy - 0 -  Change in appetite - 0 -  Feeling bad or failure about yourself  - 0 -  Trouble concentrating - 0 -  Moving slowly or fidgety/restless - 0 -  Suicidal thoughts - 0 -  PHQ-9 Score - 0 -  Difficult doing work/chores - Not difficult  at all -      Social History   Tobacco Use  Smoking Status Former Smoker  . Quit date: 2006  . Years since quitting: 16.3  Smokeless Tobacco Never Used  Tobacco Comment   quit 10 yrs   BP Readings from Last 3 Encounters:  08/29/20 134/81  08/22/20 132/78  05/23/20 126/60   Pulse Readings from Last 3 Encounters:  08/29/20 89  08/22/20 75  05/23/20 70   Wt Readings from Last 3 Encounters:  08/29/20 154 lb (69.9 kg)  08/22/20 153 lb 3.2 oz (69.5 kg)  05/23/20 151 lb 3.2 oz (68.6 kg)   BMI Readings from Last 3 Encounters:  08/29/20 27.28 kg/m  08/22/20 27.66 kg/m  05/23/20 27.30 kg/m    Assessment/Interventions: Review of patient past medical history, allergies, medications, health status, including review of consultants reports, laboratory and other test  data, was performed as part of comprehensive evaluation and provision of chronic care management services.   SDOH:  (Social Determinants of Health) assessments and interventions performed: No  SDOH Screenings   Alcohol Screen: Not on file  Depression (PHQ2-9): Low Risk   . PHQ-2 Score: 0  Financial Resource Strain: Medium Risk  . Difficulty of Paying Living Expenses: Somewhat hard  Food Insecurity: No Food Insecurity  . Worried About Charity fundraiser in the Last Year: Never true  . Ran Out of Food in the Last Year: Never true  Housing: Not on file  Physical Activity: Not on file  Social Connections: Not on file  Stress: Not on file  Tobacco Use: Medium Risk  . Smoking Tobacco Use: Former Smoker  . Smokeless Tobacco Use: Never Used  Transportation Needs: No Transportation Needs  . Lack of Transportation (Medical): No  . Lack of Transportation (Non-Medical): No    CCM Care Plan  No Known Allergies  Medications Reviewed Today    Reviewed by Kendra Opitz, COA (Technician) on 10/05/20 at [facility] List Status: <None>  Medication Order Taking? Sig Documenting Provider Last Dose Status Informant  atorvastatin (LIPITOR) 20 MG tablet [id number]  TAKE ONE TABLET BY MOUTH EVERY EVENING WITH A MEAL [name], FNP  Active   blood glucose meter kit and supplies KIT [id number] No Dispense based on patient and insurance preference. Use 3 times daily as directed to check blood sugar. Dx code e11.65 [name], FNP Taking Active   Blood Glucose Monitoring Suppl (ONE TOUCH ULTRA MINI) w/Device KIT [id number] No 1 each by Does not apply route 4 (four) times daily -  before meals and at bedtime. [name], FNP Taking Active   Cholecalciferol (VITAMIN D-3 PO) [id number] No Take 1 capsule by mouth daily. 2000 units [provider] Taking Active Self  Continuous Blood Gluc Receiver (FREESTYLE LIBRE 14 DAY READER) DEVI [id number] No USE TO CHECK BLOOD SUGARS FOUR TIMES A DAY  DX:E11.65 [name], FNP Taking Active   Continuous Blood Gluc Sensor (FREESTYLE LIBRE 2 SENSOR) [place] [id number]  Use to check blood sugars 4 times daily E11.65 [name], FNP  Active   glucose blood test strip [id number] No Use as instructed [name], FNP Taking Active   glucose blood test strip [id number] No Use as instructed to check blood sugar 3 times a day. Dx code e11.65 [name], FNP Taking Active   insulin degludec (TRESIBA FLEXTOUCH) 100 UNIT/ML FlexTouch Pen [id number] No Inject 0.21 mLs (21 Units total) into the skin daily. [name], FNP Taking Active   Lancets [facility]  ULTRASOFT) lancets [id number] No Use as instructed to check blood sugars 4 times daily E11.9 [name], FNP Taking Active   lisinopril (ZESTRIL) 10 MG tablet [id number]  Take 1 tablet (10 mg total) by mouth daily. [name], FNP  Active   metFORMIN (GLUCOPHAGE XR) 750 MG 24 hr tablet [id number]  Take 1 tablet (750 mg total) by mouth daily with breakfast. [name], FNP  Active   Semaglutide,0.25 or 0.5MG/DOS, (OZEMPIC, 0.25 OR 0.5 MG/DOSE,) 2 MG/1.5ML SOPN [id number] No Inject 0.5 mg into the skin once a week. [name], FNP Taking Active           Patient Active Problem List   Diagnosis Date Noted  . Hypertensive retinopathy of both eyes 07/06/2020  . Type 2 macular telangiectasis of right eye 05/04/2020  . Sleep apnea in adult 03/16/2020  . Vitreous floaters of left eye 12/29/2019  . Other long term (current) drug therapy 11/16/2019  . Snores 10/27/2019  . Moderate nonproliferative diabetic retinopathy of right eye with macular edema ([place]) 10/06/2019  . Moderate nonproliferative diabetic retinopathy of left eye with macular edema associated with type 2 diabetes mellitus ([place]) 10/06/2019  . Retinal exudates and deposits 10/06/2019  . Cough 11/20/2018  . Other fatigue 11/20/2018  . COVID-19 virus infection 10/13/2018  . Hypokalemia 10/13/2018  . Pyelonephritis 06/17/2018     Immunization History  Administered Date(s) Administered  . Fluad Quad(high Dose 65+) 03/22/2020  . Influenza,inj,Quad PF,6+ Mos 04/18/2018  . Influenza-Unspecified 05/01/2019  . PFIZER(Purple Top)SARS-COV-2 Vaccination 09/18/2019, 10/14/2019, 04/19/2020  . Pneumococcal Polysaccharide-23 05/30/2018  . Tdap 11/28/2017    Conditions to be addressed/monitored:  Hyperlipidemia and Diabetes   Care Plan : [place]  Updates made by Mayford Knife, RPH since 10/21/2020 12:00 AM    Problem: HLD, DMII   Priority: High    Long-Range Goal: Disease Management   This Visit's Progress: On track  Priority: High  Note:     Current Barriers:  . Unable to independently monitor therapeutic efficacy  Pharmacist Clinical Goal(s):  Marland Kitchen Patient will achieve adherence to monitoring guidelines and medication adherence to achieve therapeutic efficacy through collaboration with PharmD and provider.   Interventions: . 1:1 collaboration with [name], FNP regarding development and update of comprehensive plan of care as evidenced by provider attestation and co-signature . Inter-disciplinary care team collaboration (see longitudinal plan of care) . Comprehensive medication review performed; medication list updated in electronic medical record   Hyperlipidemia: (LDL goal < 70) -Controlled -Current treatment: . Atorvastatin 20 mg tablet daily  -Educated on Cholesterol goals;  Benefits of statin for ASCVD risk reduction; Importance of limiting foods high in cholesterol; Exercise goal of 150 minutes per week; -Recommended to continue current medication  Diabetes (A1c goal <7%) -Controlled -Current medications: . Ozempic 0.5 mg into skin once a week . Metformin 750 mg - take 1 tablet by mouth daily with breakfast . Tyler Aas Flextouch - Inject 21 units in to the skin daily  -Current home glucose readings- using CGM Freestyle [employer] Results [place] and  Reviewed  Freestyle Libre sensor placed April 26th, 2022  Within target range 90% of the time between 70-160  Above 180 mg/dL 5% of the time  Below 70 mg/dL 0% of the time  Below 54 mg/dL 0% of the time  -Denies hypoglycemic/hyperglycemic symptoms -Current meal patterns:will discuss during next office visit.  -Current exercise: patient is working  -Educated on A1c and blood sugar goals; Complications of diabetes including  [name] damage, retinal damage, and cardiovascular disease; Exercise goal of 150 minutes per week; Proper insulin injection technique; Benefits of routine self-monitoring of blood sugar; Continuous glucose monitoring;  -Counseled to check feet daily and get yearly eye exams -Recommended to continue current medication  Patient Goals/Self-Care Activities . Patient will:  - take medications as prescribed  Follow Up Plan: The patient has been provided with contact information for the care management team and has been advised to call with any health related questions or concerns.       Medication Assistance: Application for Ozempic   medication assistance program. in process.  Anticipated assistance start date 10/2020.  See plan of care for additional detail.  Patient's preferred pharmacy is:  [facility] - [place], [place] - [address] [place] [address] Phone: [phone] Fax: [phone]  Uses pill box? No - patient uses vials. Pt endorses 80% compliance  We discussed: Benefits of medication synchronization, packaging and delivery as well as enhanced pharmacist oversight with [facility]. Patient decided to: Utilize [facility] pharmacy for medication synchronization, packaging and delivery  Care Plan and Follow Up Patient Decision:  Patient agrees to Care Plan and Follow-up.  Plan: The patient has been provided with contact information for the care management team and has been advised to call  with any health related questions or concerns.   [name], PharmD Clinical Pharmacist [facility] [phone]

## 2020-10-21 NOTE — Patient Instructions (Signed)
Visit Information It was great speaking with you today!  Please let me know if you have any questions about our visit.  Goals Addressed            This Visit's Progress   . Manage My Medicine       Timeframe:  Long-Range Goal Priority:  High Start Date:                             Expected End Date:                       Follow Up Date 01/11/2021   - call for medicine refill 2 or 3 days before it runs out - keep a list of all the medicines I take; vitamins and herbals too - learn to read medicine labels - use an alarm clock or phone to remind me to take my medicine    Why is this important?   . These steps will help you keep on track with your medicines.          Patient Care Plan: CCM Pharmacy Care Plan    Problem Identified: HLD, DMII   Priority: High    Long-Range Goal: Disease Management   This Visit's Progress: On track  Priority: High  Note:     Current Barriers:  . Unable to independently monitor therapeutic efficacy  Pharmacist Clinical Goal(s):  Marland Kitchen Patient will achieve adherence to monitoring guidelines and medication adherence to achieve therapeutic efficacy through collaboration with PharmD and provider.   Interventions: . 1:1 collaboration with Arnette Felts, FNP regarding development and update of comprehensive plan of care as evidenced by provider attestation and co-signature . Inter-disciplinary care team collaboration (see longitudinal plan of care) . Comprehensive medication review performed; medication list updated in electronic medical record   Hyperlipidemia: (LDL goal < 70) -Controlled -Current treatment: . Atorvastatin 20 mg tablet daily  -Educated on Cholesterol goals;  Benefits of statin for ASCVD risk reduction; Importance of limiting foods high in cholesterol; Exercise goal of 150 minutes per week; -Recommended to continue current medication  Diabetes (A1c goal <7%) -Controlled -Current medications: . Ozempic 0.5 mg into skin  once a week . Metformin 750 mg - take 1 tablet by mouth daily with breakfast . Evaristo Bury Flextouch - Inject 21 units in to the skin daily  -Current home glucose readings- using CGM Freestyle [facility] Results [place] and Reviewed  Freestyle Libre sensor placed April 26th, 2022  Within target range 90% of the time between 70-160  Above 180 mg/dL 5% of the time  Below 70 mg/dL 0% of the time  Below 54 mg/dL 0% of the time  -Denies hypoglycemic/hyperglycemic symptoms -Current meal patterns:will discuss during next office visit.  -Current exercise: patient is working  -Educated on A1c and blood sugar goals; Complications of diabetes including kidney damage, retinal damage, and cardiovascular disease; Exercise goal of 150 minutes per week; Proper insulin injection technique; Benefits of routine self-monitoring of blood sugar; Continuous glucose monitoring;  -Counseled to check feet daily and get yearly eye exams -Recommended to continue current medication  Patient Goals/Self-Care Activities . Patient will:  - take medications as prescribed  Follow Up Plan: The patient has been provided with contact information for the care management team and has been advised to call with any health related questions or concerns.       Patient agreed to services and verbal consent obtained.  The patient verbalized understanding of instructions, educational materials, and care plan provided today and agreed to receive a mailed copy of patient instructions, educational materials, and care plan.   Cherylin Mylar, PharmD Clinical Pharmacist [facility] [phone]

## 2020-11-13 ENCOUNTER — Other Ambulatory Visit: Payer: Self-pay | Admitting: Nurse Practitioner

## 2020-11-14 ENCOUNTER — Telehealth: Payer: Self-pay

## 2020-11-14 NOTE — Chronic Care Management (AMB) (Signed)
Chronic Care Management Pharmacy Assistant   Name: Shannon [name]  MRN: [id number] DOB: 10-12-1955   Reason for Encounter: Medication Review/ Medication coordination    Recent office visits:  None  Recent consult visits:  None  Hospital visits:  None in previous 6 months  Medications: Outpatient Encounter Medications as of 11/14/2020  Medication Sig  . atorvastatin (LIPITOR) 20 MG tablet TAKE ONE TABLET BY MOUTH EVERY EVENING WITH A MEAL  . blood glucose meter kit and supplies KIT Dispense based on patient and insurance preference. Use 3 times daily as directed to check blood sugar. Dx code e11.65  . Blood Glucose Monitoring Suppl (ONE TOUCH ULTRA MINI) w/Device KIT 1 each by Does not apply route 4 (four) times daily -  before meals and at bedtime.  . Cholecalciferol (VITAMIN D-3 PO) Take 1 capsule by mouth daily. 2000 units  . Continuous Blood Gluc Receiver (FREESTYLE LIBRE 14 DAY READER) DEVI USE TO CHECK BLOOD SUGARS FOUR TIMES A DAY DX:E11.65  . Continuous Blood Gluc Sensor (FREESTYLE LIBRE 2 SENSOR) MISC Use to check blood sugars 4 times daily E11.65  . glucose blood test strip Use as instructed  . glucose blood test strip Use as instructed to check blood sugar 3 times a day. Dx code e11.65  . Lancets (ONETOUCH ULTRASOFT) lancets Use as instructed to check blood sugars 4 times daily E11.9  . lisinopril (ZESTRIL) 10 MG tablet Take 1 tablet (10 mg total) by mouth daily.  . metFORMIN (GLUCOPHAGE XR) 750 MG 24 hr tablet Take 1 tablet (750 mg total) by mouth daily with breakfast.  . Semaglutide,0.25 or 0.5MG/DOS, (OZEMPIC, 0.25 OR 0.5 MG/DOSE,) 2 MG/1.5ML SOPN Inject 0.5 mg into the skin once a week.  Tyler Aas FLEXTOUCH 100 UNIT/ML FlexTouch Pen INJECT 21 UNITS (0.51ms) into THE SKIN DAILY   No facility-administered encounter medications on file as of 11/14/2020.   Reviewed chart for medication changes ahead of medication coordination call.  No OVs, Consults, or  hospital visits since last care coordination call/Pharmacist visit. (If appropriate, list visit date, provider name)  No medication changes indicated OR if recent visit, treatment plan here.  BP Readings from Last 3 Encounters:  08/29/20 134/81  08/22/20 132/78  05/23/20 126/60    Lab Results  Component Value Date   HGBA1C 6.9 (H) 08/22/2020     Patient obtains medications through Adherence Packaging  30 Days   Last adherence delivery included:  Metformin 750 mg 1 tablet daily(breakfast) Atorvastatin 20 mg 1 tablet daily(bedtime) Lisinopril 10 mg 1 tablet daily(breakfast) Freestyle Libre Kit 2 Sensors- Use as directed every 14 days Vitamin D3 2000 units- 1 capsule daily (evening meal)  Patient is due for next adherence delivery on: 11-23-2020. Called patient and reviewed medications and coordinated delivery.  This delivery to include: Metformin 750 mg 1 tablet daily(breakfast) Atorvastatin 20 mg 1 tablet daily(bedtime) Lisinopril 10 mg 1 tablet daily(breakfast) Freestyle Libre Kit 2 Sensors- Use as directed every 14 days Vitamin D3 2000 units- 1 capsule daily (evening meal) TTyler AasFlextouch- Inject21 units sq daily  No short or acute fill needed.  Confirmed delivery date of 11-23-2020, advised patient that pharmacy will contact them the morning of delivery.   Star Rating Drugs: Atorvastatin 20 mg- Last filled 10-24-2020  30 DS at [facility]. TTyler AasFlexTouch- Last filled 10-24-2020 30 DS at [facility] Lisinopril 10 mg- [facility]- Last filled  10-24-2020 for 30 DS at [facility] Pharmacist Assistant [phone]

## 2020-11-17 ENCOUNTER — Encounter: Payer: Self-pay | Admitting: Nurse Practitioner

## 2020-11-17 ENCOUNTER — Ambulatory Visit (INDEPENDENT_AMBULATORY_CARE_PROVIDER_SITE_OTHER): Payer: HMO | Admitting: Nurse Practitioner

## 2020-11-17 ENCOUNTER — Other Ambulatory Visit: Payer: Self-pay | Admitting: Nurse Practitioner

## 2020-11-17 ENCOUNTER — Other Ambulatory Visit: Payer: Self-pay

## 2020-11-17 ENCOUNTER — Ambulatory Visit (INDEPENDENT_AMBULATORY_CARE_PROVIDER_SITE_OTHER): Payer: HMO

## 2020-11-17 VITALS — BP 118/58 | HR 80 | Temp 97.6°F | Ht 63.0 in | Wt 154.6 lb

## 2020-11-17 VITALS — BP 118/58 | HR 80 | Temp 97.6°F | Ht 63.0 in | Wt 154.5 lb

## 2020-11-17 DIAGNOSIS — Z9989 Dependence on other enabling machines and devices: Secondary | ICD-10-CM | POA: Diagnosis not present

## 2020-11-17 DIAGNOSIS — Z794 Long term (current) use of insulin: Secondary | ICD-10-CM

## 2020-11-17 DIAGNOSIS — I700 Atherosclerosis of aorta: Secondary | ICD-10-CM | POA: Diagnosis not present

## 2020-11-17 DIAGNOSIS — E782 Mixed hyperlipidemia: Secondary | ICD-10-CM | POA: Diagnosis not present

## 2020-11-17 DIAGNOSIS — E119 Type 2 diabetes mellitus without complications: Secondary | ICD-10-CM

## 2020-11-17 DIAGNOSIS — Z0000 Encounter for general adult medical examination without abnormal findings: Secondary | ICD-10-CM | POA: Diagnosis not present

## 2020-11-17 DIAGNOSIS — Z1231 Encounter for screening mammogram for malignant neoplasm of breast: Secondary | ICD-10-CM

## 2020-11-17 LAB — POCT UA - MICROALBUMIN
Albumin/Creatinine Ratio, Urine, POC: <30
Creatinine, POC: 300 mg/dL
Microalbumin Ur, POC: 30 mg/L

## 2020-11-17 LAB — POCT URINALYSIS DIPSTICK
Bilirubin, UA: NEGATIVE
Blood, UA: NEGATIVE
Glucose, UA: NEGATIVE
Ketones, UA: NEGATIVE
Nitrite, UA: NEGATIVE
Protein, UA: NEGATIVE
Spec Grav, UA: >=1.03 — AB (ref 1.010–1.025)
Urobilinogen, UA: 0.2 E.U./dL
pH, UA: 5.5 (ref 5.0–8.0)

## 2020-11-17 MED ORDER — OZEMPIC (0.25 OR 0.5 MG/DOSE) 2 MG/1.5ML ~~LOC~~ SOPN: 0.5000 mg | PEN_INJECTOR | SUBCUTANEOUS | 1 refills | Status: DC

## 2020-11-17 NOTE — Progress Notes (Signed)
This visit occurred during the SARS-CoV-2 public health emergency.  Safety protocols were in place, including screening questions prior to the visit, additional usage of staff PPE, and extensive cleaning of exam room while observing appropriate contact time as indicated for disinfecting solutions.  Subjective:   Shannon [name] is a 65 y.o. female who presents for an Initial Medicare Annual Wellness Visit.  Review of Systems     Cardiac Risk Factors include: advanced age (>28mn, >>76women);diabetes mellitus;hypertension     Objective:    Today's Vitals   11/17/20 0833  BP: (!) 118/58  Pulse: 80  Temp: 97.6 F (36.4 C)  TempSrc: Oral  Weight: 154 lb 9.6 oz (70.1 kg)  Height: 5' 3"  (1.6 m)   Body mass index is 27.39 kg/m.  Advanced Directives 11/17/2020 11/16/2019 07/23/2019 10/12/2018 06/17/2018 06/17/2018 06/15/2018  Does Patient Have a Medical Advance Directive? Yes No No No No No No  Type of AParamedicof AChesaningLiving will - - - - - -  [place] of [place]? No - copy requested - - - - - -  Would patient like information on creating a medical advance directive? - Yes (MAU/Ambulatory/Procedural Areas - Information given) No - Patient declined No - Patient declined No - Patient declined - No - Patient declined    Current Medications (verified) Outpatient Encounter Medications as of 11/17/2020  Medication Sig  . atorvastatin (LIPITOR) 20 MG tablet TAKE ONE TABLET BY MOUTH EVERY EVENING WITH A MEAL  . blood glucose meter kit and supplies KIT Dispense based on patient and insurance preference. Use 3 times daily as directed to check blood sugar. Dx code e11.65  . Blood Glucose Monitoring Suppl (ONE TOUCH ULTRA MINI) w/Device KIT 1 each by Does not apply route 4 (four) times daily -  before meals and at bedtime.  . Cholecalciferol (VITAMIN D-3 PO) Take 1 capsule by mouth daily. 2000 units  . Continuous Blood Gluc Receiver  (FREESTYLE LIBRE 14 DAY READER) DEVI USE TO CHECK BLOOD SUGARS FOUR TIMES A DAY DX:E11.65  . Continuous Blood Gluc Sensor (FREESTYLE LIBRE 2 SENSOR) MISC Check glucose as directed. CHANGE sensor every 14 DAYS  . glucose blood test strip Use as instructed  . glucose blood test strip Use as instructed to check blood sugar 3 times a day. Dx code e11.65  . Lancets (ONETOUCH ULTRASOFT) lancets Use as instructed to check blood sugars 4 times daily E11.9  . lisinopril (ZESTRIL) 10 MG tablet Take 1 tablet (10 mg total) by mouth daily.  . metFORMIN (GLUCOPHAGE XR) 750 MG 24 hr tablet Take 1 tablet (750 mg total) by mouth daily with breakfast.  . TRESIBA FLEXTOUCH 100 UNIT/ML FlexTouch Pen INJECT 21 UNITS (0.260m) into THE SKIN DAILY  . Semaglutide,0.25 or 0.5MG/DOS, (OZEMPIC, 0.25 OR 0.5 MG/DOSE,) 2 MG/1.5ML SOPN Inject 0.5 mg into the skin once a week. (Patient not taking: Reported on 11/17/2020)   No facility-administered encounter medications on file as of 11/17/2020.    Allergies (verified) Patient has no known allergies.   History: Past Medical History:  Diagnosis Date  . Diabetes mellitus without complication (HCOrting  . Hypercholesteremia   . Hypertension   . Sleep apnea    Newly diagnosed in September 2021   Past Surgical History:  Procedure Laterality Date  . ABDOMINAL HYSTERECTOMY    . CATARACT EXTRACTION Bilateral   . CESAREAN SECTION    . MYOMECTOMY     Family History  Problem  Relation Age of Onset  . Hypertension Mother   . Diabetes Mother   . Stroke Mother   . Diabetes Father   . Hypertension Father   . Stroke Father   . Hypertension Son   . Diabetes Son   . Breast cancer Neg Hx    Social History   Socioeconomic History  . Marital status: Single    Spouse name: Not on file  . Number of children: Not on file  . Years of education: Not on file  . Highest education level: Not on file  Occupational History  . Not on file  Tobacco Use  . Smoking status: Former  Smoker    Quit date: 2006    Years since quitting: 16.4  . Smokeless tobacco: Never Used  . Tobacco comment: quit 10 yrs  Vaping Use  . Vaping Use: Never used  Substance and Sexual Activity  . Alcohol use: Yes    Comment: 1 glass of wine sometimes daily   . Drug use: Never  . Sexual activity: Not on file  Other Topics Concern  . Not on file  Social History Narrative  . Not on file   Social Determinants of Health   Financial Resource Strain: Low Risk   . Difficulty of Paying Living Expenses: Not hard at all  Food Insecurity: No Food Insecurity  . Worried About Charity fundraiser in the Last Year: Never true  . Ran Out of Food in the Last Year: Never true  Transportation Needs: No Transportation Needs  . Lack of Transportation (Medical): No  . Lack of Transportation (Non-Medical): No  Physical Activity: Sufficiently Active  . Days of Exercise per Week: 4 days  . Minutes of Exercise per Session: 50 min  Stress: No Stress Concern Present  . Feeling of Stress : Not at all  Social Connections: Not on file    Tobacco Counseling Counseling given: Not Answered Comment: quit 10 yrs   Clinical Intake:  Pre-visit preparation completed: Yes  Pain : No/denies pain     Nutritional Status: BMI 25 -29 Overweight Nutritional Risks: None Diabetes: Yes  How often do you need to have someone help you when you read instructions, pamphlets, or other written materials from your doctor or pharmacy?: 1 - Never What is the last grade level you completed in school?: college and 63yr nursing school  Diabetic? Yes Nutrition Risk Assessment:  Has the patient had any N/V/D within the last 2 months?  No  Does the patient have any non-healing wounds?  No  Has the patient had any unintentional weight loss or weight gain?  No   Diabetes:  Is the patient diabetic?  Yes  If diabetic, was a CBG obtained today?  No  Did the patient bring in their glucometer from home?  No  How often do  you monitor your CBG's? daily.   Financial Strains and Diabetes Management:  Are you having any financial strains with the device, your supplies or your medication? Yes .  Does the patient want to be seen by Chronic Care Management for management of their diabetes?  Yes  Would the patient like to be referred to a Nutritionist or for Diabetic Management?  No   Diabetic Exams:  Diabetic Eye Exam: Completed 10/05/2020 Diabetic Foot Exam: Overdue, Pt has been advised about the importance in completing this exam. Pt is scheduled for diabetic foot exam on today.   Interpreter Needed?: No  Information entered by :: NAllen LPN  Activities of Daily Living In your present state of health, do you have any difficulty performing the following activities: 11/17/2020 04/20/2020  Hearing? N N  Vision? N N  Difficulty concentrating or making decisions? N N  Walking or climbing stairs? N N  Dressing or bathing? N N  Doing errands, shopping? N N  Preparing Food and eating ? N N  Using the Toilet? N N  In the past six months, have you accidently leaked urine? N N  Do you have problems with loss of bowel control? N N  Managing your Medications? N N  Managing your Finances? N N  Housekeeping or managing your Housekeeping? N N  Some recent data might be hidden    Patient Care Team: [name], FNP as PCP - General (General Practice) Mayford Knife, [facility] (Pharmacist)  Indicate any recent Medical Services you may have received from other than [facility] providers in the past year (date may be approximate).     Assessment:   This is a routine wellness examination for Shannon [name].  Hearing/Vision screen  Hearing Screening   125Hz  250Hz  500Hz  1000Hz  2000Hz  3000Hz  4000Hz  6000Hz  8000Hz   Right ear:           Left ear:           Vision Screening Comments: Regular eye exams, Dr. Zadie Rhine  Dietary issues and exercise activities discussed: Current Exercise Habits: Home exercise routine, Type of exercise:  Other - see comments (water aerobics), Time (Minutes): 45, Frequency (Times/Week): 4, Weekly Exercise (Minutes/Week): 180  Goals Addressed            This Visit's Progress   . Patient Stated       11/17/2020, keep A1C under control      Depression Screen PHQ 2/9 Scores 11/17/2020 04/20/2020 11/16/2019 07/23/2019 06/01/2019 10/22/2018 06/26/2018  PHQ - 2 Score 0 0 0 0 0 0 0  PHQ- 9 Score - - 0 - - - -    Fall Risk Fall Risk  11/17/2020 11/16/2019 06/01/2019 10/22/2018 06/26/2018  Falls in the past year? 0 0 0 0 0  Number falls in past yr: - 0 - - -  Injury with Fall? - 0 - - -  Risk for fall due to : Medication side effect - - - -  Follow up Falls evaluation completed;Education provided;Falls prevention discussed - - - -    FALL RISK PREVENTION PERTAINING TO THE HOME:  Any stairs in or around the home? No  If so, are there any without handrails? n/a Home free of loose throw rugs in walkways, pet beds, electrical cords, etc? Yes  Adequate lighting in your home to reduce risk of falls? Yes   ASSISTIVE DEVICES UTILIZED TO PREVENT FALLS:  Life alert? No  Use of a cane, walker or w/c? No  Grab bars in the bathroom? No  Shower chair or bench in shower? No  Elevated toilet seat or a handicapped toilet? No   TIMED UP AND GO:  Was the test performed? No .    Gait steady and fast without use of assistive device  Cognitive Function:     6CIT Screen 11/17/2020 11/16/2019  What Year? 0 points 0 points  What month? 0 points 0 points  What time? 0 points 0 points  Count back from 20 0 points 0 points  Months in reverse 0 points 0 points  Repeat phrase 2 points 0 points  Total Score 2 0    Immunizations Immunization History  Administered  Date(s) Administered  . Fluad Quad(high Dose 65+) 03/22/2020  . Influenza,inj,Quad PF,6+ Mos 04/18/2018  . Influenza-Unspecified 05/01/2019  . PFIZER(Purple Top)SARS-COV-2 Vaccination 09/18/2019, 10/14/2019, 04/19/2020  . Pneumococcal  Polysaccharide-23 05/30/2018  . Tdap 11/28/2017    TDAP status: Up to date  Flu Vaccine status: Up to date  Pneumococcal vaccine status: Declined,  Education has been provided regarding the importance of this vaccine but patient still declined. Advised may receive this vaccine at local pharmacy or Health Dept. Aware to provide a copy of the vaccination record if obtained from local pharmacy or Health Dept. Verbalized acceptance and understanding.   Covid-19 vaccine status: Completed vaccines  Qualifies for Shingles Vaccine? Yes   Zostavax completed No   Shingrix Completed?: No.    Education has been provided regarding the importance of this vaccine. Patient has been advised to call insurance company to determine out of pocket expense if they have not yet received this vaccine. Advised may also receive vaccine at local pharmacy or Health Dept. Verbalized acceptance and understanding.  Screening Tests Health Maintenance  Topic Date Due  . PNA vac Low Risk Adult (1 of 2 - PCV13) 06/11/2019  . FOOT EXAM  05/31/2020  . INFLUENZA VACCINE  01/23/2021  . HEMOGLOBIN A1C  02/19/2021  . OPHTHALMOLOGY EXAM  10/05/2021  . MAMMOGRAM  12/08/2021  . COLONOSCOPY (Pts 45-49yr Insurance coverage will need to be confirmed)  04/11/2023  . TETANUS/TDAP  11/29/2027  . DEXA SCAN  Completed  . COVID-19 Vaccine  Completed  . Hepatitis C Screening  Completed  . HPV VACCINES  Aged Out    Health Maintenance  Health Maintenance Due  Topic Date Due  . PNA vac Low Risk Adult (1 of 2 - PCV13) 06/11/2019  . FOOT EXAM  05/31/2020    Colorectal cancer screening: Type of screening: Colonoscopy. Completed 04/10/2013. Repeat every 10 years  Mammogram status: Completed 12/09/2019. Repeat every year  Bone Density status: Completed 12/09/2019.  Lung Cancer Screening: (Low Dose CT Chest recommended if Age 175-80years, 30 pack-year currently smoking OR have quit w/in 15years.) does not qualify.   Lung Cancer  Screening Referral: no  Additional Screening:  Hepatitis C Screening: does qualify; Completed 02/16/2020  Vision Screening: Recommended annual ophthalmology exams for early detection of glaucoma and other disorders of the eye. Is the patient up to date with their annual eye exam?  Yes  Who is the provider or what is the name of the office in which the patient attends annual eye exams? Dr. RZadie RhineIf pt is not established with a provider, would they like to be referred to a provider to establish care? No .   Dental Screening: Recommended annual dental exams for proper oral hygiene  Community Resource Referral / Chronic Care Management: CRR required this visit?  No   CCM required this visit?  No      Plan:     I have personally reviewed and noted the following in the patient's chart:   . Medical and social history . Use of alcohol, tobacco or illicit drugs  . Current medications and supplements including opioid prescriptions. Patient is not currently taking opioid prescriptions. . Functional ability and status . Nutritional status . Physical activity . Advanced directives . List of other physicians . Hospitalizations, surgeries, and ER visits in previous 12 months . Vitals . Screenings to include cognitive, depression, and falls . Referrals and appointments  In addition, I have reviewed and discussed with patient certain preventive protocols, quality metrics,  and best practice recommendations. A written personalized care plan for preventive services as well as general preventive health recommendations were provided to patient.     Kellie Simmering, LPN   [id number]   Nurse Notes:

## 2020-11-17 NOTE — Patient Instructions (Signed)
Shannon [name] , Thank you for taking time to come for your Medicare Wellness Visit. I appreciate your ongoing commitment to your health goals. Please review the following plan we discussed and let me know if I can assist you in the future.   Screening recommendations/referrals: Colonoscopy: completed 04/10/2013 Mammogram: completed 12/09/2019 Bone Density: completed 12/09/2019 Recommended yearly ophthalmology/optometry visit for glaucoma screening and checkup Recommended yearly dental visit for hygiene and checkup  Vaccinations: Influenza vaccine: completed 03/22/2020, due 01/23/2021 Pneumococcal vaccine: decline Tdap vaccine: completed 11/28/2017, due 12/09/2027 Shingles vaccine: discussed   Covid-19: 04/19/2020, 10/14/2019, 09/18/2019  Advanced directives: Please bring a copy of your POA (Power of Attorney) and/or Living Will to your next appointment.   Conditions/risks identified: none  Next appointment: Follow up in one year for your annual wellness visit    Preventive Care 65 Years and Older, Female Preventive care refers to lifestyle choices and visits with your health care provider that can promote health and wellness. What does preventive care include?  A yearly physical exam. This is also called an annual well check.  Dental exams once or twice a year.  Routine eye exams. Ask your health care provider how often you should have your eyes checked.  Personal lifestyle choices, including:  Daily care of your teeth and gums.  Regular physical activity.  Eating a healthy diet.  Avoiding tobacco and drug use.  Limiting alcohol use.  Practicing safe sex.  Taking low-dose aspirin every day.  Taking vitamin and mineral supplements as recommended by your health care provider. What happens during an annual well check? The services and screenings done by your health care provider during your annual well check will depend on your age, overall health, lifestyle risk factors, and family  history of disease. Counseling  Your health care provider may ask you questions about your:  Alcohol use.  Tobacco use.  Drug use.  Emotional well-being.  Home and relationship well-being.  Sexual activity.  Eating habits.  History of falls.  Memory and ability to understand (cognition).  Work and work Astronomer.  Reproductive health. Screening  You may have the following tests or measurements:  Height, weight, and BMI.  Blood pressure.  Lipid and cholesterol levels. These may be checked every 5 years, or more frequently if you are over 65 years old.  Skin check.  Lung cancer screening. You may have this screening every year starting at age 23 if you have a 30-pack-year history of smoking and currently smoke or have quit within the past 15 years.  Fecal occult blood test (FOBT) of the stool. You may have this test every year starting at age 3.  Flexible sigmoidoscopy or colonoscopy. You may have a sigmoidoscopy every 5 years or a colonoscopy every 10 years starting at age 78.  Hepatitis C blood test.  Hepatitis B blood test.  Sexually transmitted disease (STD) testing.  Diabetes screening. This is done by checking your blood sugar (glucose) after you have not eaten for a while (fasting). You may have this done every 1-3 years.  Bone density scan. This is done to screen for osteoporosis. You may have this done starting at age 62.  Mammogram. This may be done every 1-2 years. Talk to your health care provider about how often you should have regular mammograms. Talk with your health care provider about your test results, treatment options, and if necessary, the need for more tests. Vaccines  Your health care provider may recommend certain vaccines, such as:  Influenza vaccine.  This is recommended every year.  Tetanus, diphtheria, and acellular pertussis (Tdap, Td) vaccine. You may need a Td booster every 10 years.  Zoster vaccine. You may need this after  age 40.  Pneumococcal 13-valent conjugate (PCV13) vaccine. One dose is recommended after age 100.  Pneumococcal polysaccharide (PPSV23) vaccine. One dose is recommended after age 67. Talk to your health care provider about which screenings and vaccines you need and how often you need them. This information is not intended to replace advice given to you by your health care provider. Make sure you discuss any questions you have with your health care provider. Document Released: 07/08/2015 Document Revised: 02/29/2016 Document Reviewed: 04/12/2015 Elsevier Interactive Patient Education  2017 [employer] Prevention in the Home Falls can cause injuries. They can happen to people of all ages. There are many things you can do to make your home safe and to help prevent falls. What can I do on the outside of my home?  Regularly fix the edges of walkways and driveways and fix any cracks.  Remove anything that might make you trip as you walk through a door, such as a raised step or threshold.  Trim any bushes or trees on the path to your home.  Use bright outdoor lighting.  Clear any walking paths of anything that might make someone trip, such as rocks or tools.  Regularly check to see if handrails are loose or broken. Make sure that both sides of any steps have handrails.  Any raised decks and porches should have guardrails on the edges.  Have any leaves, snow, or ice cleared regularly.  Use sand or salt on walking paths during winter.  Clean up any spills in your garage right away. This includes oil or grease spills. What can I do in the bathroom?  Use night lights.  Install grab bars by the toilet and in the tub and shower. Do not use towel bars as grab bars.  Use non-skid mats or decals in the tub or shower.  If you need to sit down in the shower, use a plastic, non-slip stool.  Keep the floor dry. Clean up any water that spills on the floor as soon as it  happens.  Remove soap buildup in the tub or shower regularly.  Attach bath mats securely with double-sided non-slip rug tape.  Do not have throw rugs and other things on the floor that can make you trip. What can I do in the bedroom?  Use night lights.  Make sure that you have a light by your bed that is easy to reach.  Do not use any sheets or blankets that are too big for your bed. They should not hang down onto the floor.  Have a firm chair that has side arms. You can use this for support while you get dressed.  Do not have throw rugs and other things on the floor that can make you trip. What can I do in the kitchen?  Clean up any spills right away.  Avoid walking on wet floors.  Keep items that you use a lot in easy-to-reach places.  If you need to reach something above you, use a strong step stool that has a grab bar.  Keep electrical cords out of the way.  Do not use floor polish or wax that makes floors slippery. If you must use wax, use non-skid floor wax.  Do not have throw rugs and other things on the floor that can make  you trip. What can I do with my stairs?  Do not leave any items on the stairs.  Make sure that there are handrails on both sides of the stairs and use them. Fix handrails that are broken or loose. Make sure that handrails are as long as the stairways.  Check any carpeting to make sure that it is firmly attached to the stairs. Fix any carpet that is loose or worn.  Avoid having throw rugs at the top or bottom of the stairs. If you do have throw rugs, attach them to the floor with carpet tape.  Make sure that you have a light switch at the top of the stairs and the bottom of the stairs. If you do not have them, ask someone to add them for you. What else can I do to help prevent falls?  Wear shoes that:  Do not have high heels.  Have rubber bottoms.  Are comfortable and fit you well.  Are closed at the toe. Do not wear sandals.  If you  use a stepladder:  Make sure that it is fully opened. Do not climb a closed stepladder.  Make sure that both sides of the stepladder are locked into place.  Ask someone to hold it for you, if possible.  Clearly mark and make sure that you can see:  Any grab bars or handrails.  First and last steps.  Where the edge of each step is.  Use tools that help you move around (mobility aids) if they are needed. These include:  Canes.  Walkers.  Scooters.  Crutches.  Turn on the lights when you go into a dark area. Replace any light bulbs as soon as they burn out.  Set up your furniture so you have a clear path. Avoid moving your furniture around.  If any of your floors are uneven, fix them.  If there are any pets around you, be aware of where they are.  Review your medicines with your doctor. Some medicines can make you feel dizzy. This can increase your chance of falling. Ask your doctor what other things that you can do to help prevent falls. This information is not intended to replace advice given to you by your health care provider. Make sure you discuss any questions you have with your health care provider. Document Released: 04/07/2009 Document Revised: 11/17/2015 Document Reviewed: 07/16/2014 Elsevier Interactive Patient Education  2017 [employer].

## 2020-11-17 NOTE — Patient Instructions (Signed)

## 2020-11-17 NOTE — Progress Notes (Addendum)
I,Yamilka Roman [employer] as a Education administrator for [employer], FNP.,have documented all relevant documentation on the behalf of [name], FNP,as directed by  [name], FNP while in the presence of [name], [place].  This visit occurred during the SARS-CoV-2 public health emergency.  Safety protocols were in place, including screening questions prior to the visit, additional usage of staff PPE, and extensive cleaning of exam room while observing appropriate contact time as indicated for disinfecting solutions.  Subjective:     Patient ID: Shannon [name] , female    DOB: June 08, 1956 , 65 y.o.   MRN: [id number]   Chief Complaint  Patient presents with  . Diabetes  . Hypertension    HPI  Patient is here for diabetes and htn follow up. She has been without Ozempic for one month.    Wt Readings from Last 3 Encounters: 08/22/20 : 153 lb 3.2 oz (69.5 kg) 05/23/20 : 151 lb 3.2 oz (68.6 kg) 03/22/20 : 154 lb 6.4 oz (70 kg)  She had her AWV done today with [facility]  Diabetes She presents for her follow-up diabetic visit. She has type 2 diabetes mellitus. Her disease course has been stable. Pertinent negatives for hypoglycemia include no confusion, dizziness or nervousness/anxiousness. Pertinent negatives for diabetes include no fatigue, no polydipsia, no polyphagia and no polyuria. There are no hypoglycemic complications. Symptoms are stable. There are no diabetic complications. Risk factors for coronary artery disease include diabetes mellitus, hypertension and obesity. Current diabetic treatment includes oral agent (dual therapy). She is compliant with treatment all of the time (she has only been taking 0.25 mg weekly, trying to spread out her medication when she did not have insurance). She is following a generally healthy diet. When asked about meal planning, she reported none. She has not had a previous visit with a dietitian. She participates in exercise every other day. Her home blood  glucose trend is increasing steadily. Her overall blood glucose range is 140-180 mg/dl. (She wears the [place].    Blood sugar today was 142 she feels is related to not having the Ozempic. ) An ACE inhibitor/angiotensin II receptor blocker is being taken. She does not see a podiatrist.Eye exam current: not done at this time, needs a new opthamologist.     Past Medical History:  Diagnosis Date  . Diabetes mellitus without complication ([place])   . Hypercholesteremia   . Hypertension   . Sleep apnea    Newly diagnosed in September 2021     Family History  Problem Relation Age of Onset  . Hypertension Mother   . Diabetes Mother   . Stroke Mother   . Diabetes Father   . Hypertension Father   . Stroke Father   . Hypertension Son   . Diabetes Son   . Breast cancer Neg Hx      Current Outpatient Medications:  .  atorvastatin (LIPITOR) 20 MG tablet, TAKE ONE TABLET BY MOUTH EVERY EVENING WITH A MEAL, Disp: 90 tablet, Rfl: 1 .  blood glucose meter kit and supplies KIT, Dispense based on patient and insurance preference. Use 3 times daily as directed to check blood sugar. Dx code e11.65, Disp: 1 each, Rfl: 9 .  Blood Glucose Monitoring Suppl (ONE TOUCH ULTRA MINI) w/Device KIT, 1 each by Does not apply route 4 (four) times daily -  before meals and at bedtime., Disp: 1 kit, Rfl: 0 .  Cholecalciferol (VITAMIN D-3 PO), Take 1 capsule by mouth daily. 2000 units, Disp: , Rfl:  .  Continuous Blood Gluc Receiver (FREESTYLE LIBRE 14 DAY READER) DEVI, USE TO CHECK BLOOD SUGARS FOUR TIMES A DAY DX:E11.65, Disp: 1 each, Rfl: 1 .  Continuous Blood Gluc Sensor (FREESTYLE LIBRE 2 SENSOR) MISC, Check glucose as directed. CHANGE sensor every 14 DAYS, Disp: 1 each, Rfl: 1 .  glucose blood test strip, Use as instructed, Disp: 100 each, Rfl: 5 .  glucose blood test strip, Use as instructed to check blood sugar 3 times a day. Dx code e11.65, Disp: 100 each, Rfl: 12 .  Lancets (ONETOUCH ULTRASOFT) lancets, Use as  instructed to check blood sugars 4 times daily E11.9, Disp: 300 each, Rfl: 3 .  lisinopril (ZESTRIL) 10 MG tablet, Take 1 tablet (10 mg total) by mouth daily., Disp: 90 tablet, Rfl: 0 .  metFORMIN (GLUCOPHAGE XR) 750 MG 24 hr tablet, Take 1 tablet (750 mg total) by mouth daily with breakfast., Disp: 90 tablet, Rfl: 0 .  Semaglutide,0.25 or 0.5MG/DOS, (OZEMPIC, 0.25 OR 0.5 MG/DOSE,) 2 MG/1.5ML SOPN, Inject 0.5 mg into the skin once a week., Disp: 4.5 mL, Rfl: 1 .  TRESIBA FLEXTOUCH 100 UNIT/ML FlexTouch Pen, INJECT 21 UNITS (0.68ms) into THE SKIN DAILY, Disp: 15 mL, Rfl: 1   No Known Allergies   Review of Systems  Constitutional: Negative for fatigue.  Endocrine: Negative for polydipsia, polyphagia and polyuria.  Neurological: Negative for dizziness.  Psychiatric/Behavioral: Negative for confusion. The patient is not nervous/anxious.      Today's Vitals   11/17/20 0849  BP: (!) 118/58  Pulse: 80  Temp: 97.6 F (36.4 C)  Weight: 154 lb 8.7 oz (70.1 kg)  Height: 5' 3"  (1.6 m)  PainSc: 0-No pain   Body mass index is 27.38 kg/m.   Objective:  Physical Exam Vitals reviewed.  Constitutional:      General: She is not in acute distress.    Appearance: Normal appearance. She is well-developed.  Cardiovascular:     Rate and Rhythm: Normal rate and regular rhythm.     Pulses: Normal pulses.     Heart sounds: Normal heart sounds. No murmur heard.   Pulmonary:     Effort: Pulmonary effort is normal. No respiratory distress.     Breath sounds: Normal breath sounds. No wheezing.  Chest:     Chest wall: No tenderness.  Skin:    General: Skin is warm and dry.     Capillary Refill: Capillary refill takes less than 2 seconds.  Neurological:     General: No focal deficit present.     Mental Status: She is alert and oriented to person, place, and time.     Cranial Nerves: No cranial nerve deficit.     Motor: No weakness.  Psychiatric:        Mood and Affect: Mood normal.         Behavior: Behavior normal.        Thought Content: Thought content normal.        Judgment: Judgment normal.         Assessment And Plan:     1. Type 2 diabetes mellitus without complication, with long-term current use of insulin (HCC)  Chronic, fair control  Continue with current medications  Encouraged to limit intake of sugary foods and drinks  Encouraged to increase physical activity to 150 minutes per week  Diabetic foot exam done, she has decreased sensation bilaterally - Hemoglobin A1c - CMP14+EGFR - Semaglutide,0.25 or 0.5MG/DOS, (OZEMPIC, 0.25 OR 0.5 MG/DOSE,) 2 MG/1.5ML SOPN; Inject 0.5 mg into  the skin once a week.  Dispense: 4.5 mL; Refill: 1  2. Aortic atherosclerosis (HCC)  Chronic, continue with statin - Lipid panel  3. Mixed hyperlipidemia  Chronic, controlled  Continue with current medications, tolerating medication well - Lipid panel - CMP14+EGFR  4. CPAP (continuous positive airway pressure) dependence  She feels she is doing well with her CPAP. Does feel has a better quality of life with her CPAP   Patient was given opportunity to ask questions. Patient verbalized understanding of the plan and was able to repeat key elements of the plan. All questions were answered to their satisfaction.  [name], FNP   I, [name], FNP, have reviewed all documentation for this visit. The documentation on 11/17/20 for the exam, diagnosis, procedures, and orders are all accurate and complete.   IF YOU HAVE BEEN REFERRED TO A SPECIALIST, IT MAY TAKE 1-2 WEEKS TO SCHEDULE/PROCESS THE REFERRAL. IF YOU HAVE NOT HEARD FROM US/SPECIALIST IN TWO WEEKS, PLEASE GIVE [place] A CALL AT [phone].   THE PATIENT IS ENCOURAGED TO PRACTICE SOCIAL DISTANCING DUE TO THE COVID-19 PANDEMIC.

## 2020-11-17 NOTE — Addendum Note (Signed)
Addended by: Elisha Ponder E on: 11/17/2020 12:07 PM   Modules accepted: Orders

## 2020-11-18 LAB — CMP14+EGFR
ALT: 34 IU/L — ABNORMAL HIGH (ref 0–32)
AST: 23 IU/L (ref 0–40)
Albumin/Globulin Ratio: 1.8 (ref 1.2–2.2)
Albumin: 4.9 g/dL — ABNORMAL HIGH (ref 3.8–4.8)
Alkaline Phosphatase: 104 IU/L (ref 44–121)
BUN/Creatinine Ratio: 28 (ref 12–28)
BUN: 19 mg/dL (ref 8–27)
Bilirubin Total: 0.4 mg/dL (ref 0.0–1.2)
CO2: 24 mmol/L (ref 20–29)
Calcium: 9.6 mg/dL (ref 8.7–10.3)
Chloride: 102 mmol/L (ref 96–106)
Creatinine, Ser: 0.68 mg/dL (ref 0.57–1.00)
Globulin, Total: 2.7 g/dL (ref 1.5–4.5)
Glucose: 150 mg/dL — ABNORMAL HIGH (ref 65–99)
Potassium: 4.7 mmol/L (ref 3.5–5.2)
Sodium: 142 mmol/L (ref 134–144)
Total Protein: 7.6 g/dL (ref 6.0–8.5)
eGFR: 96 mL/min/{1.73_m2} (ref >59)

## 2020-11-18 LAB — LIPID PANEL
Chol/HDL Ratio: 2.4 ratio (ref 0.0–4.4)
Cholesterol, Total: 154 mg/dL (ref 100–199)
HDL: 63 mg/dL (ref >39)
LDL Chol Calc (NIH): 72 mg/dL (ref 0–99)
Triglycerides: 107 mg/dL (ref 0–149)
VLDL Cholesterol Cal: 19 mg/dL (ref 5–40)

## 2020-11-18 LAB — HEMOGLOBIN A1C
Est. average glucose Bld gHb Est-mCnc: 151 mg/dL
Hgb A1c MFr Bld: 6.9 % — ABNORMAL HIGH (ref 4.8–5.6)

## 2020-11-30 ENCOUNTER — Ambulatory Visit (INDEPENDENT_AMBULATORY_CARE_PROVIDER_SITE_OTHER): Payer: HMO | Admitting: Ophthalmology

## 2020-11-30 ENCOUNTER — Encounter (INDEPENDENT_AMBULATORY_CARE_PROVIDER_SITE_OTHER): Payer: Self-pay | Admitting: Ophthalmology

## 2020-11-30 ENCOUNTER — Other Ambulatory Visit: Payer: Self-pay

## 2020-11-30 DIAGNOSIS — E113311 Type 2 diabetes mellitus with moderate nonproliferative diabetic retinopathy with macular edema, right eye: Secondary | ICD-10-CM | POA: Diagnosis not present

## 2020-11-30 DIAGNOSIS — H35071 Retinal telangiectasis, right eye: Secondary | ICD-10-CM | POA: Diagnosis not present

## 2020-11-30 DIAGNOSIS — E113312 Type 2 diabetes mellitus with moderate nonproliferative diabetic retinopathy with macular edema, left eye: Secondary | ICD-10-CM

## 2020-11-30 MED ORDER — BEVACIZUMAB 2.5 MG/0.1ML IZ SOSY
2.5000 mg | PREFILLED_SYRINGE | INTRAVITREAL | Status: AC | PRN
  Administered 2020-11-30: 2.5 mg via INTRAVITREAL

## 2020-11-30 NOTE — Progress Notes (Signed)
11/30/2020     CHIEF COMPLAINT Patient presents for Retina Follow Up (8 week fu OD and Avastin OD/Pt states VA OU stable since last visit. Pt denies FOL, floaters, or ocular pain OU. Karie Mainland: 6.9/LBS: 123)   HISTORY OF PRESENT ILLNESS: Shannon [name] is a 65 y.o. female who presents to the clinic today for:   HPI    Retina Follow Up    Diagnosis: Diabetic Retinopathy   Laterality: right eye   Onset: 8 weeks ago   Severity: mild   Duration: 8 weeks   Course: stable   Comments: 8 week fu OD and Avastin OD Pt states VA OU stable since last visit. Pt denies FOL, floaters, or ocular pain OU.  A1C: 6.9 LBS: 123       Last edited by Kendra Opitz, COA on 11/30/2020  8:19 AM. (History)      Referring physician: [name], [address],  [place] [address]  HISTORICAL INFORMATION:   Selected notes from the MEDICAL RECORD NUMBER    Lab Results  Component Value Date   HGBA1C 6.9 (H) 11/17/2020     CURRENT MEDICATIONS: No current outpatient medications on file. (Ophthalmic Drugs)   No current facility-administered medications for this visit. (Ophthalmic Drugs)   Current Outpatient Medications (Other)  Medication Sig  . atorvastatin (LIPITOR) 20 MG tablet TAKE ONE TABLET BY MOUTH EVERY EVENING WITH A MEAL  . blood glucose meter kit and supplies KIT Dispense based on patient and insurance preference. Use 3 times daily as directed to check blood sugar. Dx code e11.65  . Blood Glucose Monitoring Suppl (ONE TOUCH ULTRA MINI) w/Device KIT 1 each by Does not apply route 4 (four) times daily -  before meals and at bedtime.  . Cholecalciferol (VITAMIN D-3 PO) Take 1 capsule by mouth daily. 2000 units  . Continuous Blood Gluc Receiver (FREESTYLE LIBRE 14 DAY READER) DEVI USE TO CHECK BLOOD SUGARS FOUR TIMES A DAY DX:E11.65  . Continuous Blood Gluc Sensor (FREESTYLE LIBRE 2 SENSOR) MISC Check glucose as directed. CHANGE sensor every 14 DAYS  . glucose  blood test strip Use as instructed  . glucose blood test strip Use as instructed to check blood sugar 3 times a day. Dx code e11.65  . Lancets (ONETOUCH ULTRASOFT) lancets Use as instructed to check blood sugars 4 times daily E11.9  . lisinopril (ZESTRIL) 10 MG tablet Take 1 tablet (10 mg total) by mouth daily.  . metFORMIN (GLUCOPHAGE XR) 750 MG 24 hr tablet Take 1 tablet (750 mg total) by mouth daily with breakfast.  . Semaglutide,0.25 or 0.5MG/DOS, (OZEMPIC, 0.25 OR 0.5 MG/DOSE,) 2 MG/1.5ML SOPN Inject 0.5 mg into the skin once a week.  . TRESIBA FLEXTOUCH 100 UNIT/ML FlexTouch Pen INJECT 21 UNITS (0.5ms) into THE SKIN DAILY   No current facility-administered medications for this visit. (Other)      REVIEW OF SYSTEMS:    ALLERGIES No Known Allergies  PAST MEDICAL HISTORY Past Medical History:  Diagnosis Date  . [place]   . Hypercholesteremia   . Hypertension   . Sleep apnea    Newly diagnosed in September 2021   Past Surgical History:  Procedure Laterality Date  . ABDOMINAL HYSTERECTOMY    . CATARACT EXTRACTION Bilateral   . CESAREAN SECTION    . MYOMECTOMY      FAMILY HISTORY Family History  Problem Relation Age of Onset  . Hypertension Mother   . Diabetes  Mother   . Stroke Mother   . Diabetes Father   . Hypertension Father   . Stroke Father   . Hypertension Son   . Diabetes Son   . Breast cancer Neg Hx     SOCIAL HISTORY Social History   Tobacco Use  . Smoking status: Former Smoker    Quit date: 2006    Years since quitting: 16.4  . Smokeless tobacco: Never Used  . Tobacco comment: quit 10 yrs  Vaping Use  . Vaping Use: Never used  Substance Use Topics  . Alcohol use: Yes    Comment: 1 glass of wine sometimes daily   . Drug use: Never         OPHTHALMIC EXAM: Base Eye Exam    Visual Acuity (ETDRS)      Right Left   Dist [place] 20/20 -2 20/25 +2       Tonometry (Tonopen, 8:22 AM)      Right Left    Pressure 16 16       Pupils      Pupils Dark Light Shape React APD   Right PERRL 3 2 Round Brisk None   Left PERRL 3 2 Round Brisk None       Visual Fields (Counting fingers)      Left Right    Full Full       Extraocular Movement      Right Left    Full Full       Neuro/Psych    Oriented x3: Yes   Mood/Affect: Normal       Dilation    Right eye: 1.0% Mydriacyl, 2.5% Phenylephrine @ 8:22 AM        Slit Lamp and Fundus Exam    External Exam      Right Left   External Normal Normal       Slit Lamp Exam      Right Left   Lids/Lashes Normal Normal   Conjunctiva/Sclera White and quiet White and quiet   Cornea Clear Clear   Anterior Chamber Deep and quiet Deep and quiet   Iris Round and reactive Round and reactive   Lens Posterior chamber intraocular lens Posterior chamber intraocular lens   Anterior Vitreous Normal Normal       Fundus Exam      Right Left   Posterior Vitreous Normal    Disc Normal    C/D Ratio 0.2    Macula  Exudates, Macular thickening, Mild clinically significant macular edema    Vessels NPDR- Moderate    Periphery Normal           IMAGING AND PROCEDURES  Imaging and Procedures for 11/30/20  OCT, Retina - OU - Both Eyes       Right Eye Quality was good. Scan locations included subfoveal. Central Foveal Thickness: 310. Progression has improved. Findings include abnormal foveal contour.   Left Eye Quality was good. Scan locations included subfoveal. Central Foveal Thickness: 310. Progression has been stable. Findings include normal foveal contour.   Notes CSME OD, now responsive to intravitreal Avastin since patient has commence therapy of sleep apnea and its potential hypoxic damage nightly to macular perfusion.  Currently at 8-week follow-up.  Currently at 8-week follow-up.  We will repeat injection today and examination again in 10 weeks       Intravitreal Injection, Pharmacologic Agent - OD - Right Eye       Time  Out 11/30/2020. 9:08 AM. Confirmed correct patient, procedure,  site, and patient consented.   Anesthesia Topical anesthesia was used. Anesthetic medications included [place] 3.5%.   Procedure Preparation included Tobramycin 0.3%, 5% betadine to ocular surface, 10% betadine to eyelids. A 30 gauge needle was used.   Injection:  2.5 mg Bevacizumab (AVASTIN) 2.85m/0.1mL SOSY   NDC: 706269-485-46 Lot:: [id number]  Route: Intravitreal, Site: Right Eye  Post-op Post injection exam found visual acuity of at least counting fingers. The patient tolerated the procedure well. There were no complications. The patient received written and verbal post procedure care education. Post injection medications were not given.                 ASSESSMENT/PLAN:  Moderate nonproliferative diabetic retinopathy of left eye with macular edema associated with type 2 diabetes mellitus (HCC) OS stable by exam  Type 2 macular telangiectasis of right eye Superotemporal to the fovea improved now on consistent CPAP use and responsive also to antivegF.  Now longer interval follow-up at 8 weeks we will extend interval of interval examination next to 10 weeks      ICD-10-CM   1. Moderate nonproliferative diabetic retinopathy of right eye with macular edema associated with type 2 diabetes mellitus (HCC)  E11.3311 OCT, Retina - OU - Both Eyes    Intravitreal Injection, Pharmacologic Agent - OD - Right Eye    bevacizumab (AVASTIN) SOSY 2.5 mg  2. Moderate nonproliferative diabetic retinopathy of left eye with macular edema associated with type 2 diabetes mellitus ([place]  EX38.1829  3. Type 2 macular telangiectasis of right eye  H35.071     1.  CSME OD now responsive to antivegF now the patient no longer faces nightly hypoxic maculopathy damage from untreated OSA.  On CPAP with excellent compliance and patient enjoys all the typical benefits including improved sleep, improved sense of wellbeing more energy.  2.  CSME OD  now well controlled on antivegF at 8-week interval now we will extend next interval examination to 10 weeks  3.  Ophthalmic Meds Ordered this visit:  Meds ordered this encounter  Medications  . bevacizumab (AVASTIN) SOSY 2.5 mg       Return in about 10 weeks (around 02/08/2021) for dilate, OD, AVASTIN OCT.  There are no Patient Instructions on file for this visit.   Explained the diagnoses, plan, and follow up with the patient and they expressed understanding.  Patient expressed understanding of the importance of proper follow up care.   GClent DemarkRankin M.D. Diseases & Surgery of the Retina and Vitreous Retina & Diabetic [place]06/08/22     Abbreviations: M myopia (nearsighted); A astigmatism; H hyperopia (farsighted); P presbyopia; Mrx spectacle prescription;  CTL contact lenses; OD right eye; OS left eye; OU both eyes  XT exotropia; ET esotropia; PEK punctate epithelial keratitis; PEE punctate epithelial erosions; DES dry eye syndrome; MGD meibomian gland dysfunction; ATs artificial tears; PFAT's preservative free artificial tears; NGreensburgnuclear sclerotic cataract; PSC posterior subcapsular cataract; ERM epi-retinal membrane; PVD posterior vitreous detachment; RD retinal detachment; DM diabetes mellitus; DR diabetic retinopathy; NPDR non-proliferative diabetic retinopathy; PDR proliferative diabetic retinopathy; CSME clinically significant macular edema; DME diabetic macular edema; dbh dot blot hemorrhages; CWS cotton wool spot; POAG primary open angle glaucoma; C/D cup-to-disc ratio; HVF humphrey visual field; GVF goldmann visual field; OCT optical coherence tomography; IOP intraocular pressure; BRVO Branch retinal vein occlusion; CRVO central retinal vein occlusion; CRAO central retinal artery occlusion; BRAO branch retinal artery occlusion; RT retinal tear; SB scleral buckle; PPV pars plana  vitrectomy; VH Vitreous hemorrhage; PRP panretinal laser photocoagulation; IVK intravitreal  kenalog; VMT vitreomacular traction; MH Macular hole;  NVD neovascularization of the disc; NVE neovascularization elsewhere; AREDS age related eye disease study; ARMD age related macular degeneration; POAG primary open angle glaucoma; EBMD epithelial/anterior basement membrane dystrophy; ACIOL anterior chamber intraocular lens; IOL intraocular lens; PCIOL posterior chamber intraocular lens; Phaco/IOL phacoemulsification with intraocular lens placement; [place] photorefractive keratectomy; LASIK laser assisted in situ keratomileusis; HTN hypertension; DM diabetes mellitus; COPD chronic obstructive pulmonary disease

## 2020-11-30 NOTE — Assessment & Plan Note (Signed)
OS stable by exam

## 2020-11-30 NOTE — Assessment & Plan Note (Signed)
Superotemporal to the fovea improved now on consistent CPAP use and responsive also to antivegF.  Now longer interval follow-up at 8 weeks we will extend interval of interval examination next to 10 weeks

## 2020-12-07 ENCOUNTER — Ambulatory Visit: Payer: HMO | Admitting: Family Medicine

## 2020-12-08 DIAGNOSIS — G4733 Obstructive sleep apnea (adult) (pediatric): Secondary | ICD-10-CM | POA: Diagnosis not present

## 2020-12-14 ENCOUNTER — Telehealth: Payer: Self-pay

## 2020-12-14 ENCOUNTER — Other Ambulatory Visit: Payer: Self-pay

## 2020-12-14 DIAGNOSIS — E119 Type 2 diabetes mellitus without complications: Secondary | ICD-10-CM

## 2020-12-14 MED ORDER — METFORMIN HCL ER 750 MG PO TB24: 750.0000 mg | ORAL_TABLET | Freq: Every day | ORAL | 0 refills | Status: DC

## 2020-12-14 MED ORDER — LISINOPRIL 10 MG PO TABS: 10.0000 mg | ORAL_TABLET | Freq: Every day | ORAL | 0 refills | Status: DC

## 2020-12-14 NOTE — Chronic Care Management (AMB) (Signed)
Chronic Care Management Pharmacy Assistant   Name: Shannon [name]  MRN: [id number] DOB: June 12, 1956   Reason for Encounter: Medication Review/ Medication Coordination  Recent office visits:  11-17-2020 Shannon [name], [place]. CMP14+EGFR glucose=150, Albumin 4.9, ALT 34. A1C= 6.9.  Recent consult visits:  11-30-2020 Shannon Horn, MD (Ophthalmology). Avastin 2.5 mg injection given in right eye. OCT, Retina-OU-both eyes ordered.  Hospital visits:  None in previous 6 months  Medications: Outpatient Encounter Medications as of 12/14/2020  Medication Sig   atorvastatin (LIPITOR) 20 MG tablet TAKE ONE TABLET BY MOUTH EVERY EVENING WITH A MEAL   blood glucose meter kit and supplies KIT Dispense based on patient and insurance preference. Use 3 times daily as directed to check blood sugar. Dx code e11.65   Blood Glucose Monitoring Suppl (ONE TOUCH ULTRA MINI) w/Device KIT 1 each by Does not apply route 4 (four) times daily -  before meals and at bedtime.   Cholecalciferol (VITAMIN D-3 PO) Take 1 capsule by mouth daily. 2000 units   Continuous Blood Gluc Receiver (FREESTYLE LIBRE 14 DAY READER) DEVI USE TO CHECK BLOOD SUGARS FOUR TIMES A DAY DX:E11.65   Continuous Blood Gluc Sensor (FREESTYLE LIBRE 2 SENSOR) MISC Check glucose as directed. CHANGE sensor every 14 DAYS   glucose blood test strip Use as instructed   glucose blood test strip Use as instructed to check blood sugar 3 times a day. Dx code e11.65   Lancets (ONETOUCH ULTRASOFT) lancets Use as instructed to check blood sugars 4 times daily E11.9   lisinopril (ZESTRIL) 10 MG tablet Take 1 tablet (10 mg total) by mouth daily.   metFORMIN (GLUCOPHAGE XR) 750 MG 24 hr tablet Take 1 tablet (750 mg total) by mouth daily with breakfast.   Semaglutide,0.25 or 0.5MG/DOS, (OZEMPIC, 0.25 OR 0.5 MG/DOSE,) 2 MG/1.5ML SOPN Inject 0.5 mg into the skin once a week.   TRESIBA FLEXTOUCH 100 UNIT/ML FlexTouch Pen INJECT 21 UNITS (0.78ms) into THE  SKIN DAILY   No facility-administered encounter medications on file as of 12/14/2020.   Reviewed chart for medication changes ahead of medication coordination call.  No OVs, Consults, or hospital visits since last care coordination call/Pharmacist visit. (If appropriate, list visit date, provider name)  No medication changes indicated OR if recent visit, treatment plan here.  BP Readings from Last 3 Encounters:  11/17/20 (!) 118/58  11/17/20 (!) 118/58  08/29/20 134/81    Lab Results  Component Value Date   HGBA1C 6.9 (H) 11/17/2020     Patient obtains medications through Adherence Packaging  30 Days   Last adherence delivery included:  Metformin 750 mg 1 tablet daily (breakfast) Atorvastatin 20 mg 1 tablet daily(bedtime) Lisinopril 10 mg 1 tablet daily (breakfast) Freestyle Libre Kit 2 Sensors- Use as directed every 14 days Vitamin D3 2000 units- 1 capsule daily (evening meal) TTyler AasFlextouch - Inject 21 units sq daily  Patient didn't deny any medications  Patient is due for next adherence delivery on: 12-22-2020  Called patient and reviewed medications and coordinated delivery.  This delivery to include: Metformin 750 mg 1 tablet daily (breakfast) Atorvastatin 20 mg 1 tablet daily(bedtime) Lisinopril 10 mg 1 tablet daily (breakfast) Freestyle Libre Kit 2 Sensors- Use as directed every 14 days Vitamin D3 2000 units- 1 capsule daily (evening meal) TTyler AasFlextouch - Inject 21 units sq daily Ozempic 0.25 mg weekly  No acute or short fill needed  Patient declined the following medications (meds) due to (reason): None  Patient needs  refills for Lisinopril 10 mg daily (Breakfast) Metformin 750 mg daily (Breakfast)  Confirmed delivery date of 12-22-2020 advised patient that pharmacy will contact them the morning of delivery.  NOTES: Patient reported last blood sugar reading 94. Sent refill request to PCP for Lisinopril and Metformin.  Star Rating  Drugs: Atorvastatin 20 mg- Last filled 11-17-2020  30 DS at [facility]. Lisinopril 10 mg- Last filled 11-17-2020 30 DS at [facility]- Last filled 11-17-2020 for 84 DS at [facility] Metformin ER 750 mg- Last filled 11-17-2020 for 30 DS at [facility] Pharmacist Assistant [phone]

## 2020-12-15 ENCOUNTER — Ambulatory Visit (INDEPENDENT_AMBULATORY_CARE_PROVIDER_SITE_OTHER): Payer: HMO | Admitting: Family Medicine

## 2020-12-15 ENCOUNTER — Encounter: Payer: Self-pay | Admitting: Family Medicine

## 2020-12-15 VITALS — BP 122/70 | HR 85 | Ht 63.0 in | Wt 155.2 lb

## 2020-12-15 DIAGNOSIS — Z9989 Dependence on other enabling machines and devices: Secondary | ICD-10-CM

## 2020-12-15 DIAGNOSIS — G4733 Obstructive sleep apnea (adult) (pediatric): Secondary | ICD-10-CM

## 2020-12-15 NOTE — Progress Notes (Addendum)
PATIENT: Shannon [name] DOB: [birth year]  REASON FOR VISIT: follow up HISTORY FROM: patient  Chief Complaint  Patient presents with   Obstructive Sleep Apnea    RM 1, alone. Here for cpap f/u, reports doing well, w/ no issues or concerns.      HISTORY OF PRESENT ILLNESS: 12/15/20 ALL:  Shannon [name] is a 65 y.o. female here today for follow up for OSA on CPAP. She is doing well with new mask. She has not had any difficulty using CPAP. She does feel a little better rested in the mornings.        08/29/2020 SA: Shannon [name] is a 65 year old right-handed woman with an underlying medical history of hypertension, hyperlipidemia, diabetes, diabetic retinopathy and overweight state, who presents for follow-up consultation of her obstructive sleep apnea after interim testing and starting AutoPap therapy.  The patient is unaccompanied today.  I first met her at the request of her primary care nurse practitioner on 01/06/2020, at which time she reported snoring and excessive daytime somnolence.  She was encouraged to seek evaluation for sleep apnea by her retina specialist who had noted macular edema and felt it could be in part secondary to underlying sleep apnea.  She was advised to proceed with sleep testing.  She had a home sleep test on 03/02/2020 which indicated overall mild sleep apnea with an AHI of 7.7/h, O2 nadir of 89%.  Given her medical history and daytime somnolence reported she was encouraged to start AutoPap therapy.  Set up date was 05/10/20.   Today, 08/29/20: I reviewed her AutoPap compliance data from 07/26/2020 through 08/24/2020, which is a total of 30 days, during which time she used her machine only 14 days with percent use days greater than 4 hours at only 3%, indicating low compliance, average AHI 11.6/h, 95th percentile of pressure at 5.5 cm, leak for the 95th percentile at 13.7 L/min, pressure range of 5 to 11 cm with EPR of 3.   I reviewed her AutoPap compliance data  from 06/26/2020 through 07/25/2020, which is a total of 30 days, during which time she used her machine 24 days with percent use days greater than 4 hours at 40%, indicating suboptimal compliance with an average usage of 2 hours and 50 minutes for days on treatment, residual AHI 4.2/h, 95th percentile of pressure at 8.4 cm with a range of 5 to 11 cm, leak on the higher side with a 95th percentile at 17.5 L/min.  Her compliance in the month of late November through late December was similar, percent use days greater than 4 hours was 33%.  She reports that she works third shift.  She tries to keep the mask on at least 4 hours daily.  She generally sleeps between 1 and 6 PM.  She has to be at work at 7 PM and works 12 hours.  She reports that she feels a little better, sleep quality and energy are a little better.  She reports that she had an eye examination and her right eye looks a little better.  I reviewed the note from Dr. Deloria Lair from 08/17/2020.  She is motivated to continue with treatment.  She reports using a nasal mask.  She does breathe at times through her mouth and is wondering if she could try a full facemask.  She talked to aero care regarding her low readings messages on her machine and she was instructed to troubleshoot at home by unplugging and restarting.  She  still gets the low readings.   REVIEW OF SYSTEMS: Out of a complete 14 system review of symptoms, the patient complains only of the following symptoms, and all other reviewed systems are negative.  ESS: 4  ALLERGIES: No Known Allergies  HOME MEDICATIONS: Outpatient Medications Prior to Visit  Medication Sig Dispense Refill   atorvastatin (LIPITOR) 20 MG tablet TAKE ONE TABLET BY MOUTH EVERY EVENING WITH A MEAL 90 tablet 1   blood glucose meter kit and supplies KIT Dispense based on patient and insurance preference. Use 3 times daily as directed to check blood sugar. Dx code e11.65 1 each 9   Blood Glucose Monitoring Suppl (ONE  TOUCH ULTRA MINI) w/Device KIT 1 each by Does not apply route 4 (four) times daily -  before meals and at bedtime. 1 kit 0   Cholecalciferol (VITAMIN D-3 PO) Take 1 capsule by mouth daily. 2000 units     Continuous Blood Gluc Receiver (FREESTYLE LIBRE 14 DAY READER) DEVI USE TO CHECK BLOOD SUGARS FOUR TIMES A DAY DX:E11.65 1 each 1   Continuous Blood Gluc Sensor (FREESTYLE LIBRE 2 SENSOR) MISC Check glucose as directed. CHANGE sensor every 14 DAYS 1 each 1   glucose blood test strip Use as instructed 100 each 5   glucose blood test strip Use as instructed to check blood sugar 3 times a day. Dx code e11.65 100 each 12   Lancets (ONETOUCH ULTRASOFT) lancets Use as instructed to check blood sugars 4 times daily E11.9 300 each 3   lisinopril (ZESTRIL) 10 MG tablet Take 1 tablet (10 mg total) by mouth daily. 90 tablet 0   metFORMIN (GLUCOPHAGE XR) 750 MG 24 hr tablet Take 1 tablet (750 mg total) by mouth daily with breakfast. 90 tablet 0   Semaglutide,0.25 or 0.5MG/DOS, (OZEMPIC, 0.25 OR 0.5 MG/DOSE,) 2 MG/1.5ML SOPN Inject 0.5 mg into the skin once a week. 4.5 mL 1   TRESIBA FLEXTOUCH 100 UNIT/ML FlexTouch Pen INJECT 21 UNITS (0.49ms) into THE SKIN DAILY 15 mL 1   No facility-administered medications prior to visit.    PAST MEDICAL HISTORY: Past Medical History:  Diagnosis Date   Diabetes mellitus without complication ([place]    Hypercholesteremia    Hypertension    Sleep apnea    Newly diagnosed in September 2021    PAST SURGICAL HISTORY: Past Surgical History:  Procedure Laterality Date   ABDOMINAL HYSTERECTOMY     CATARACT EXTRACTION Bilateral    CESAREAN SECTION     MYOMECTOMY      FAMILY HISTORY: Family History  Problem Relation Age of Onset   Hypertension Mother    Diabetes Mother    Stroke Mother    Diabetes Father    Hypertension Father    Stroke Father    Hypertension Son    Diabetes Son    Breast cancer Neg Hx     SOCIAL HISTORY: Social History   Socioeconomic  History   Marital status: Single    Spouse name: Not on file   Number of children: Not on file   Years of education: Not on file   Highest education level: Not on file  Occupational History   Not on file  Tobacco Use   Smoking status: Former    Pack years: 0.00    Types: Cigarettes    Quit date: 2006    Years since quitting: 16.4   Smokeless tobacco: Never   Tobacco comments:    quit 10 yrs  Vaping Use  Vaping Use: Never used  Substance and Sexual Activity   Alcohol use: Yes    Comment: 1 glass of wine sometimes daily    Drug use: Never   Sexual activity: Not on file  Other Topics Concern   Not on file  Social History Narrative   Not on file   Social Determinants of Health   Financial Resource Strain: Low Risk    Difficulty of Paying Living Expenses: Not hard at all  Food Insecurity: No Food Insecurity   Worried About Charity fundraiser in the Last Year: Never true   [place] in the Last Year: Never true  Transportation Needs: No Transportation Needs   Lack of Transportation (Medical): No   Lack of Transportation (Non-Medical): No  Physical Activity: Sufficiently Active   Days of Exercise per Week: 4 days   Minutes of Exercise per Session: 50 min  Stress: No Stress Concern Present   Feeling of Stress : Not at all  Social Connections: Not on file  Intimate Partner Violence: Not on file     PHYSICAL EXAM  Vitals:   12/15/20 1447  BP: 122/70  Pulse: 85  Weight: 155 lb 3.2 oz (70.4 kg)  Height: $Remove'5\' 3"'gDAmXKm$  (1.6 m)   Body mass index is 27.49 kg/m.  Generalized: Well developed, in no acute distress  Cardiology: normal rate and rhythm, no murmur noted Respiratory: clear to auscultation bilaterally  Neurological examination  Mentation: Alert oriented to time, place, history taking. Follows all commands speech and language fluent Cranial nerve II-XII: Pupils were equal round reactive to light. Extraocular movements were full, visual field were full  Motor:  The motor testing reveals 5 over 5 strength of all 4 extremities. Good symmetric motor tone is noted throughout.  Gait and station: Gait is normal.    DIAGNOSTIC DATA (LABS, IMAGING, TESTING) - I reviewed patient records, labs, notes, testing and imaging myself where available.  No flowsheet data found.   Lab Results  Component Value Date   WBC 5.8 11/16/2019   HGB 11.2 11/16/2019   HCT 33.9 (L) 11/16/2019   MCV 85 11/16/2019   PLT 213 11/16/2019      Component Value Date/Time   NA 142 11/17/2020 0927   K 4.7 11/17/2020 0927   CL 102 11/17/2020 0927   CO2 24 11/17/2020 0927   GLUCOSE 150 (H) 11/17/2020 0927   GLUCOSE 183 (H) 10/18/2018 0550   BUN 19 11/17/2020 0927   CREATININE 0.68 11/17/2020 0927   CALCIUM 9.6 11/17/2020 0927   PROT 7.6 11/17/2020 0927   ALBUMIN 4.9 (H) 11/17/2020 0927   AST 23 11/17/2020 0927   ALT 34 (H) 11/17/2020 0927   ALKPHOS 104 11/17/2020 0927   BILITOT 0.4 11/17/2020 0927   GFRNONAA 93 05/23/2020 1139   GFRAA 107 05/23/2020 1139   Lab Results  Component Value Date   CHOL 154 11/17/2020   HDL 63 11/17/2020   LDLCALC 72 11/17/2020   TRIG 107 11/17/2020   CHOLHDL 2.4 11/17/2020   Lab Results  Component Value Date   HGBA1C 6.9 (H) 11/17/2020   Lab Results  Component Value Date   VITAMINB12 438 09/20/2008   Lab Results  Component Value Date   TSH 1.700 11/16/2019     ASSESSMENT AND PLAN 65 y.o. year old female  has a past medical history of Diabetes mellitus without complication ([place]), Hypercholesteremia, Hypertension, and Sleep apnea. here with     ICD-10-CM   1. OSA on CPAP  G47.33 For home use only DME continuous positive airway pressure (CPAP)   Z99.89         Nayelis Bonito is doing well on CPAP therapy. Compliance report reveals acceptable complaince. She was encouraged to continue using CPAP nightly and for greater than 4 hours each night. We will update supply orders as indicated. Risks of untreated sleep apnea  review and education materials provided. Healthy lifestyle habits encouraged. She will follow up in 1 year, sooner if needed. She  verbalizes understanding and agreement with this plan.    Orders Placed This Encounter  Procedures   For home use only DME continuous positive airway pressure (CPAP)    Supplies    Order Specific Question:   Length of Need    Answer:   Lifetime    Order Specific Question:   Patient has OSA or probable OSA    Answer:   Yes    Order Specific Question:   Is the patient currently using CPAP in the home    Answer:   Yes    Order Specific Question:   Settings    Answer:   Other see comments    Order Specific Question:   CPAP supplies needed    Answer:   Mask, headgear, cushions, filters, heated tubing and water chamber      No orders of the defined types were placed in this encounter.     Debbora Presto, FNP-C 12/15/2020, 3:16 PM [facility] [address], [place], [place] [address] [phone]  I reviewed the above note and documentation by the Nurse Practitioner and agree with the history, exam, assessment and plan as outlined above. I was available for consultation. Star Age, MD, PhD [facility])

## 2020-12-15 NOTE — Patient Instructions (Signed)

## 2021-01-05 ENCOUNTER — Telehealth: Payer: Self-pay

## 2021-01-05 ENCOUNTER — Other Ambulatory Visit: Payer: Self-pay | Admitting: Nurse Practitioner

## 2021-01-05 DIAGNOSIS — Z794 Long term (current) use of insulin: Secondary | ICD-10-CM

## 2021-01-05 DIAGNOSIS — E119 Type 2 diabetes mellitus without complications: Secondary | ICD-10-CM

## 2021-01-05 NOTE — Chronic Care Management (AMB) (Signed)
Chronic Care Management Pharmacy Assistant   Name: Shannon [name]  MRN: [id number] DOB: April 25, 1956   Reason for Encounter: Patient assistance, Diabetes  Recent office visits:  11-17-2020 [name], [place]. CMP14+EGFR glucose= 150, Albumin= 4.9, ALT= 34. A1C= 6.9  11-17-2020 Kellie Simmering, LPN. Medicare wellness  Recent consult visits:  11-30-2020 [name], MD (Ophthalmology). OCT, Retina both eyes. Avastin injection in right eye.  12-15-2020 Lomax, Amy, NP ([facility]). Follow up on CPAP.  Hospital visits:  None in previous 6 months  Medications: Outpatient Encounter Medications as of 01/05/2021  Medication Sig   atorvastatin (LIPITOR) 20 MG tablet TAKE ONE TABLET BY MOUTH EVERY EVENING WITH A MEAL   blood glucose meter kit and supplies KIT Dispense based on patient and insurance preference. Use 3 times daily as directed to check blood sugar. Dx code e11.65   Blood Glucose Monitoring Suppl (ONE TOUCH ULTRA MINI) w/Device KIT 1 each by Does not apply route 4 (four) times daily -  before meals and at bedtime.   Cholecalciferol (VITAMIN D-3 PO) Take 1 capsule by mouth daily. 2000 units   Continuous Blood Gluc Receiver (FREESTYLE LIBRE 14 DAY READER) DEVI USE TO CHECK BLOOD SUGARS FOUR TIMES A DAY DX:E11.65   Continuous Blood Gluc Sensor (FREESTYLE LIBRE 2 SENSOR) MISC Check glucose as directed. CHANGE sensor every 14 DAYS   glucose blood test strip Use as instructed   glucose blood test strip Use as instructed to check blood sugar 3 times a day. Dx code e11.65   Lancets (ONETOUCH ULTRASOFT) lancets Use as instructed to check blood sugars 4 times daily E11.9   lisinopril (ZESTRIL) 10 MG tablet Take 1 tablet (10 mg total) by mouth daily.   metFORMIN (GLUCOPHAGE XR) 750 MG 24 hr tablet Take 1 tablet (750 mg total) by mouth daily with breakfast.   Semaglutide,0.25 or 0.5MG/DOS, (OZEMPIC, 0.25 OR 0.5 MG/DOSE,) 2 MG/1.5ML SOPN Inject 0.5 mg into the skin  once a week.   TRESIBA FLEXTOUCH 100 UNIT/ML FlexTouch Pen INJECT 21 UNITS into THE SKIN DAILY   No facility-administered encounter medications on file as of 01/05/2021.   Recent Relevant Labs: Lab Results  Component Value Date/Time   HGBA1C 6.9 (H) 11/17/2020 09:27 AM   HGBA1C 6.9 (H) 08/22/2020 03:53 PM   MICROALBUR 30 11/17/2020 12:05 PM   MICROALBUR 80 08/11/2019 10:29 AM    Kidney Function Lab Results  Component Value Date/Time   CREATININE 0.68 11/17/2020 09:27 AM   CREATININE 0.57 08/22/2020 03:53 PM   GFRNONAA 93 05/23/2020 11:39 AM   GFRAA 107 05/23/2020 11:39 AM    Current antihyperglycemic regimen:  Ozempic 0.5 mg into skin once a week Metformin 750 mg - take 1 tablet by mouth daily with breakfast Tresiba Flextouch - Inject 21 units in to the skin daily  What recent interventions/DTPs have been made to improve glycemic control:  Exercise goal of 150 minutes per week Proper insulin injection technique Counseled to check feet daily and get yearly eye exams  Have there been any recent hospitalizations or ED visits since last visit with CPP? No  Patient denies hypoglycemic symptoms  Patient denies hyperglycemic symptoms  How often are you checking your blood sugar? once daily  What are your blood sugars ranging?  Fasting: None Before meals: 142 After meals: None Bedtime: None  During the week, how often does your blood glucose drop below 70? Never  Are you checking your feet daily/regularly? Patient states daily  Adherence  Review: Is the patient currently on a STATIN medication? Yes Is the patient currently on ACE/ARB medication? Yes Does the patient have >5 day gap between last estimated fill dates? No  NOTES: [employer] Patient Assistance and patient was approved for Ozempic with Patient Assistance and will be shipped out 10-14 days. Patient will like to receive medication through Patient assistance for now instead of [facility]. Updated request  with [facility].  Care Gaps: Shingrix overdue Covid booster overdue Medicare wellness 11-30-2021 RAF= 0.841 %  Star Rating Drugs: Ozempic 0.5 mg- Last filled 12-16-2020 30 [facility]. Patient assistance approved and will be shipped in 10-14 days. Metformin ER 750 mg- Last filled 12-16-2020 30 [facility] Lisinopril 10 mg- Last filled 12-16-2020 [facility] Atorvastatin 20 mg- Last filled 12-16-2020 [facility]  [facility] Clinical Pharmacist Assistant [phone]

## 2021-01-10 ENCOUNTER — Telehealth: Payer: Self-pay

## 2021-01-10 NOTE — Chronic Care Management (AMB) (Signed)
   Patient aware of telephone appointment with Cherylin Mylar CPP on 01-11-2021 at 9:30. Patient aware to have/bring all medications, supplements, blood pressure and/or blood sugar logs to visit.  Questions: Have you had any recent office visit or specialist visit outside of [facility]? Patient stated no  Are there any concerns you would like to discuss during your office visit? Patient stated no  Are you having any problems obtaining your medications? (Whether it pharmacy issues or cost) Patient stated no  If patient has any PAP medications ask if they are having any problems getting their PAP medication or refill? Patient stated no  Care Gaps: Shingrix overdue Covid booster overdue Medicare wellness 11-30-2021 RAF= 0.841 %  Star Rating Drug: Ozempic 0.5 mg- Last filled 12-16-2020 30 [facility]. Patient assistance approved and will be shipped in 10-14 days. Metformin ER 750 mg- Last filled 12-16-2020 [facility] Lisinopril 10 mg- Last filled 12-16-2020 [facility] Atorvastatin 20 mg- Last filled 12-16-2020 30 [facility]  Any gaps in medications fill history? No   Huey Romans [facility] Clinical Pharmacist Assistant [phone]

## 2021-01-11 ENCOUNTER — Ambulatory Visit (INDEPENDENT_AMBULATORY_CARE_PROVIDER_SITE_OTHER): Payer: HMO

## 2021-01-11 DIAGNOSIS — Z794 Long term (current) use of insulin: Secondary | ICD-10-CM

## 2021-01-11 DIAGNOSIS — E119 Type 2 diabetes mellitus without complications: Secondary | ICD-10-CM

## 2021-01-11 DIAGNOSIS — E782 Mixed hyperlipidemia: Secondary | ICD-10-CM | POA: Diagnosis not present

## 2021-01-11 NOTE — Patient Instructions (Signed)
Visit Information It was great speaking with you today!  Please let me know if you have any questions about our visit.   Goals Addressed             This Visit's Progress    COMPLETED: Manage My Medicine       Timeframe:  Long-Range Goal Priority:  High Start Date:                             Expected End Date:                       Follow Up Date 06/29/2021  Completed:  - keep a list of all the medicines I take; vitamins and herbals too - use a pillbox to sort medicine - use an alarm clock or phone to remind me to take my medicine     Why is this important?   These steps will help you keep on track with your medicines.        Monitor and Manage My Blood Sugar-Diabetes Type 2       Timeframe:  Long-Range Goal Priority:  Medium Start Date:                             Expected End Date:                       Follow Up Date 06/29/2021    - check blood sugar before and after exercise - check blood sugar if I feel it is too high or too low    Why is this important?   Checking your blood sugar at home helps to keep it from getting very high or very low.  Writing the results in a diary or log helps the doctor know how to care for you.  Your blood sugar log should have the time, date and the results.  Also, write down the amount of insulin or other medicine that you take.  Other information, like what you ate, exercise done and how you were feeling, will also be helpful.     Notes:  Please call with any questions that you might have        Patient Care Plan: CCM Pharmacy Care Plan     Problem Identified: HLD, DMII   Priority: High     Long-Range Goal: Disease Management   Recent Progress: On track  Priority: High  Note:     Current Barriers:  Unable to independently afford treatment regimen Unable to independently monitor therapeutic efficacy  Pharmacist Clinical Goal(s):  Patient will verbalize ability to afford treatment regimen achieve adherence to  monitoring guidelines and medication adherence to achieve therapeutic efficacy through collaboration with PharmD and provider.   Interventions: 1:1 collaboration with Arnette Felts, FNP regarding development and update of comprehensive plan of care as evidenced by provider attestation and co-signature Inter-disciplinary care team collaboration (see longitudinal plan of care) Comprehensive medication review performed; medication list updated in electronic medical record  Hyperlipidemia: (LDL goal < 70) -Controlled -Current treatment: Atorvastatin 20 mg tablet once per day -Current dietary patterns: eating fresh fruit and vegetables avoiding fried and fatty foods  -Current exercise habits: Silver sneakers aerobics -Congratulated patient on losing inches and feeling good.  -Educated on Importance of limiting foods high in cholesterol; -Recommended to continue current medication  Diabetes (A1c goal <7%) -  Not ideally controlled -Current medications: Tresiba 100 unit/ml : 21 units into the skin daily  Ozempic 0.5mg  once a week on Friday Metformin XR 750 mg tablet once per day  -Medications previously tried:   -Current home glucose readings : CGM has been at goal below 140 her high is 160 and this after she has eaten ( 90 - lower 100's). She had a low BS reading last week.  -Reports hypoglycemic/hyperglycemic symptoms  -This Monday she felt the low BS reading, her legs felt weak and she felt nervous and jittery and stopped exercising, she went home and had something to eat afterwards.  -Current meal patterns:  breakfast: fruit   lunch: usually does not eat lunch  dinner: heaviest meal, salad - with fruit juice  snacks: nutritious snacks: plums, grapes, plums, raw celery, fruit drinks: water adds parsley, mint and strawberry  -Current exercise: she goes to aerobic classes with silver sneakers, water aerobics -We discussed ways to prevent hypoglycemia after exercise including the  following:  -Having a snack with slower-acting carbohydrates, such as a granola bar or trail mix, before and after your workout to help prevent a drop in your blood sugar. -Educated on Prevention and management of hypoglycemic episodes; Counseled to check feet daily and get yearly eye exams  -Recommended to continue current medication  Patient Goals/Self-Care Activities Patient will:  - take medications as prescribed -Begin to have a long acting carbohydrate prior to exercise and afterwards.   Follow Up Plan: The patient has been provided with contact information for the care management team and has been advised to call with any health related questions or concerns.       Patient agreed to services and verbal consent obtained.   The patient verbalized understanding of instructions, educational materials, and care plan provided today and agreed to receive a mailed copy of patient instructions, educational materials, and care plan.   Shannon [name], PharmD Clinical Pharmacist [facility] [phone]

## 2021-01-11 NOTE — Progress Notes (Signed)
Chronic Care Management Pharmacy Note  01/11/2021 Name:  Shannon [name] MRN:  [id number] DOB:  [birth year]  Summary: Patient reports that she is doing okay. She reports having hypoglycemia last week and it was a frightening experience.   Recommendations/Changes made from today's visit: Recommended patient continue to closely monitor BS. Recommend patient receive COVID-19 booster.   Plan: Patient to receive COVID-19 vaccine booster today at [place] to have a long acting carbohydrate prior to exercise and afterwards and check BS prior to exercising and afterwards.   Subjective: Shannon [name] is an 65 y.o. year old female who is a primary patient of [name], [place].  The CCM team was consulted for assistance with disease management and care coordination needs.    Engaged with patient by telephone for follow up visit in response to provider referral for pharmacy case management and/or care coordination services. She is currently working [employer] as a Marine scientist. She reports that things get busy at night and so sometimes she does not have time to eat. She was diagnosed with DM II in 1986. She is going on multiple vacations including to [place] and other places. She   Consent to Services:  The patient was given information about Chronic Care Management services, agreed to services, and gave verbal consent prior to initiation of services.  Please see initial visit note for detailed documentation.   Patient Care Team: [name], FNP as PCP - General (General Practice) Mayford Knife, [facility] (Pharmacist)  Recent office visits: 11/17/2020 PCP OV 08/22/2020 PCP OV  Recent consult visits: 10/05/2020 Retina specialist 08/29/2020 Neurology [facility] visits: None in previous 6 months  Objective:  Lab Results  Component Value Date   CREATININE 0.68 11/17/2020   BUN 19 11/17/2020   GFRNONAA 93 05/23/2020   GFRAA 107 05/23/2020   NA 142 11/17/2020   K 4.7 11/17/2020    CALCIUM 9.6 11/17/2020   CO2 24 11/17/2020   GLUCOSE 150 (H) 11/17/2020    Lab Results  Component Value Date/Time   HGBA1C 6.9 (H) 11/17/2020 09:27 AM   HGBA1C 6.9 (H) 08/22/2020 03:53 PM   MICROALBUR 30 11/17/2020 12:05 PM   MICROALBUR 80 08/11/2019 10:29 AM    Last diabetic Eye exam:  Lab Results  Component Value Date/Time   HMDIABEYEEXA Retinopathy (A) 09/14/2019 12:00 AM    Last diabetic Foot exam: No results found for: HMDIABFOOTEX   Lab Results  Component Value Date   CHOL 154 11/17/2020   HDL 63 11/17/2020   LDLCALC 72 11/17/2020   TRIG 107 11/17/2020   CHOLHDL 2.4 11/17/2020    Hepatic Function Latest Ref Rng & Units 11/17/2020 08/22/2020 05/23/2020  Total Protein 6.0 - 8.5 g/dL 7.6 7.0 6.7  Albumin 3.8 - 4.8 g/dL 4.9(H) 4.6 4.4  AST 0 - 40 IU/L 23 20 12   ALT 0 - 32 IU/L 34(H) 31 21  Alk Phosphatase 44 - 121 IU/L 104 92 80  Total Bilirubin 0.0 - 1.2 mg/dL 0.4 0.3 0.3    Lab Results  Component Value Date/Time   TSH 1.700 11/16/2019 03:50 PM   TSH 0.797 08/29/2018 10:37 AM    CBC Latest Ref Rng & Units 11/16/2019 10/18/2018 10/17/2018  WBC 3.4 - 10.8 x10E3/uL 5.8 6.5 8.7  Hemoglobin 11.1 - 15.9 g/dL 11.2 11.7(L) 11.7(L)  Hematocrit 34.0 - 46.6 % 33.9(L) 36.0 35.0(L)  Platelets 150 - 450 x10E3/uL 213 204 181    No results found for: VD25OH  Clinical  ASCVD: No  The 10-year ASCVD risk score Mikey Bussing DC Jr., et al., 2013) is: 15.3%   Values used to calculate the score:     Age: 65 years     Sex: Female     Is Non-Hispanic African American: Yes     Diabetic: Yes     Tobacco smoker: No     Systolic Blood Pressure: 272 mmHg     Is BP treated: Yes     HDL Cholesterol: 63 mg/dL     Total Cholesterol: 154 mg/dL    Depression screen [facility] 2/9 11/17/2020 04/20/2020 11/16/2019  Decreased Interest 0 0 0  Down, Depressed, Hopeless 0 0 0  PHQ - 2 Score 0 0 0  Altered sleeping - - 0  Tired, decreased energy - - 0  Change in appetite - - 0  Feeling bad or failure about  yourself  - - 0  Trouble concentrating - - 0  Moving slowly or fidgety/restless - - 0  Suicidal thoughts - - 0  PHQ-9 Score - - 0  Difficult doing work/chores - - Not difficult at all     Social History   Tobacco Use  Smoking Status Former   Types: Cigarettes   Quit date: 2006   Years since quitting: 16.5  Smokeless Tobacco Never  Tobacco Comments   quit 10 yrs   BP Readings from Last 3 Encounters:  12/15/20 122/70  11/17/20 (!) 118/58  11/17/20 (!) 118/58   Pulse Readings from Last 3 Encounters:  12/15/20 85  11/17/20 80  11/17/20 80   Wt Readings from Last 3 Encounters:  12/15/20 155 lb 3.2 oz (70.4 kg)  11/17/20 154 lb 8.7 oz (70.1 kg)  11/17/20 154 lb 9.6 oz (70.1 kg)   BMI Readings from Last 3 Encounters:  12/15/20 27.49 kg/m  11/17/20 27.38 kg/m  11/17/20 27.39 kg/m    Assessment/Interventions: Review of patient past medical history, allergies, medications, health status, including review of consultants reports, laboratory and other test data, was performed as part of comprehensive evaluation and provision of chronic care management services.   SDOH:  (Social Determinants of Health) assessments and interventions performed: Yes  SDOH Screenings   Alcohol Screen: Not on file  Depression (PHQ2-9): Low Risk    PHQ-2 Score: 0  Financial Resource Strain: Low Risk    Difficulty of Paying Living Expenses: Not hard at all  Food Insecurity: No Food Insecurity   Worried About Charity fundraiser in the Last Year: Never true   Ran Out of Food in the Last Year: Never true  Housing: Not on file  Physical Activity: Sufficiently Active   Days of Exercise per Week: 4 days   Minutes of Exercise per Session: 50 min  Social Connections: Not on file  Stress: No Stress Concern Present   Feeling of Stress : Not at all  Tobacco Use: Medium Risk   Smoking Tobacco Use: Former   Smokeless Tobacco Use: Never  Transportation Needs: No Transportation Needs   Lack of  Transportation (Medical): No   Lack of Transportation (Non-Medical): No    CCM Care Plan  No Known Allergies  Medications Reviewed Today     Reviewed by Debbora Presto, NP (Nurse Practitioner) on 12/15/20 at 1517  Med List Status: <None>   Medication Order Taking? Sig Documenting Provider Last Dose Status Informant  atorvastatin (LIPITOR) 20 MG tablet [id number] No TAKE ONE TABLET BY MOUTH EVERY EVENING WITH A MEAL [name], FNP Taking Active  blood glucose meter kit and supplies KIT [id number] No Dispense based on patient and insurance preference. Use 3 times daily as directed to check blood sugar. Dx code e11.65 [name], FNP Taking Active   Blood Glucose Monitoring Suppl (ONE TOUCH ULTRA MINI) w/Device KIT [id number] No 1 each by Does not apply route 4 (four) times daily -  before meals and at bedtime. [name], FNP Taking Active   Cholecalciferol (VITAMIN D-3 PO) [id number] No Take 1 capsule by mouth daily. 2000 units [provider] Taking Active Self  Continuous Blood Gluc Receiver (FREESTYLE LIBRE 14 DAY READER) DEVI [id number] No USE TO CHECK BLOOD SUGARS FOUR TIMES A DAY DX:E11.65 [name], FNP Taking Active   Continuous Blood Gluc Sensor (FREESTYLE LIBRE 2 SENSOR) MISC [id number] No Check glucose as directed. CHANGE sensor every 14 DAYS [name], FNP Taking Active   glucose blood test strip [id number] No Use as instructed [name], FNP Taking Active   glucose blood test strip [id number] No Use as instructed to check blood sugar 3 times a day. Dx code e11.65 [name], FNP Taking Active   Lancets [facility] ULTRASOFT) lancets [id number] No Use as instructed to check blood sugars 4 times daily E11.9 [name], FNP Taking Active   lisinopril (ZESTRIL) 10 MG tablet [id number]  Take 1 tablet (10 mg total) by mouth daily. [name], FNP  Active   metFORMIN (GLUCOPHAGE XR) 750 MG 24 hr tablet [id number]  Take 1 tablet (750 mg total) by mouth daily with  breakfast. [name], FNP  Active   Semaglutide,0.25 or 0.5MG/DOS, (OZEMPIC, 0.25 OR 0.5 MG/DOSE,) 2 MG/1.5ML SOPN [id number]  Inject 0.5 mg into the skin once a week. [name], FNP  Active   TRESIBA FLEXTOUCH 100 UNIT/ML FlexTouch Pen [id number] No INJECT 21 UNITS (0.33ms) into THE SKIN DAILY MMinette Brine FNP Taking Active             Patient Active Problem List   Diagnosis Date Noted   Hypertensive retinopathy of both eyes 07/06/2020   Type 2 macular telangiectasis of right eye 05/04/2020   Sleep apnea in adult 03/16/2020   Vitreous floaters of left eye 12/29/2019   Other long term (current) drug therapy 11/16/2019   Snores 10/27/2019   Moderate nonproliferative diabetic retinopathy of right eye with macular edema (HCC) 10/06/2019   Moderate nonproliferative diabetic retinopathy of left eye with macular edema associated with type 2 diabetes mellitus ([place] 10/06/2019   Retinal exudates and deposits 10/06/2019   Cough 11/20/2018   Other fatigue 11/20/2018   COVID-19 virus infection 10/13/2018   Hypokalemia 10/13/2018   Pyelonephritis 06/17/2018    Immunization History  Administered Date(s) Administered   Fluad Quad(high Dose 65+) 03/22/2020   Influenza,inj,Quad PF,6+ Mos 04/18/2018   Influenza-Unspecified 05/01/2019   PFIZER(Purple Top)SARS-COV-2 Vaccination 09/18/2019, 10/14/2019, 04/19/2020   Pneumococcal Polysaccharide-23 05/30/2018   Tdap 11/28/2017    Conditions to be addressed/monitored:  Hyperlipidemia and Diabetes  Care [place] : [place] Updates made by PMayford Knife RIv[place] 01/11/2021 12:00 AM     Problem: HLD, DMII   Priority: High     Long-Range Goal: Disease Management   This Visit's Progress: On track  Recent Progress: On track  Priority: High  Note:     Current Barriers:  Unable to independently afford treatment regimen Unable to independently monitor therapeutic efficacy  Pharmacist Clinical Goal(s):  Patient  will verbalize ability to afford treatment regimen achieve adherence to monitoring guidelines and medication  adherence to achieve therapeutic efficacy through collaboration with PharmD and provider.   Interventions: 1:1 collaboration with [name], FNP regarding development and update of comprehensive plan of care as evidenced by provider attestation and co-signature Inter-disciplinary care team collaboration (see longitudinal plan of care) Comprehensive medication review performed; medication list updated in electronic medical record  Hyperlipidemia: (LDL goal < 70) -Controlled -Current treatment: Atorvastatin 20 mg tablet once per day -Current dietary patterns: eating fresh fruit and vegetables avoiding fried and fatty foods  -Current exercise habits: Silver sneakers aerobics -Congratulated patient on losing inches and feeling good.  -Educated on Importance of limiting foods high in cholesterol; -Recommended to continue current medication  Diabetes (A1c goal <7%) -Not ideally controlled -Current medications: Tresiba 100 unit/ml : 21 units into the skin daily  Ozempic 0.54m once a week on Friday Metformin XR 750 mg tablet once per day  -Medications previously tried:   -Current home glucose readings : CGM has been at goal below 140 her high is 160 and this after she has eaten ( 90 - lower 100's). She had a low BS reading last week.  -Reports hypoglycemic/hyperglycemic symptoms  -This Monday she felt the low BS reading, her legs felt weak and she felt nervous and jittery and stopped exercising, she went home and had something to eat afterwards.  -Current meal patterns:  breakfast: fruit   lunch: usually does not eat lunch  dinner: heaviest meal, salad - with fruit juice  snacks: nutritious snacks: plums, grapes, plums, raw celery, fruit drinks: water adds parsley, mint and strawberry  -Current exercise: she goes to aerobic classes with silver sneakers, water aerobics -We  discussed ways to prevent hypoglycemia after exercise including the following:  -Having a snack with slower-acting carbohydrates, such as a granola bar or trail mix, before and after your workout to help prevent a drop in your blood sugar. -Educated on Prevention and management of hypoglycemic episodes; Counseled to check feet daily and get yearly eye exams -Recommended to continue current medication  Health Maintenance -Vaccine gaps: COVID-19 Booster, Shingrix Vaccine  -Recommended patient receive COVID-19 booster vaccines [facility], confirmation number:  [id number]   Patient Goals/Self-Care Activities Patient will:  - take medications as prescribed -Begin to have a long acting carbohydrate prior to exercise and afterwards.   Follow Up Plan: The patient has been provided with contact information for the care management team and has been advised to call with any health related questions or concerns.       Medication Assistance:  Ozempic obtained through [employer] [employer] assistance program.  Enrollment ends 05/2021  Compliance/Adherence/Medication fill history: Care Gaps: COVID-19 Vaccine Booster  Star-Rating Drugs: Atorvastatin 20 mg tablet Metformin 750 mg tablet  Lisinopril 10 mg tablet Ozempic 0.5 mg   Patient's preferred [place]  [facility] [place] [place]: [phone]: [phone] Uses pill box? Yes Pt endorses 95% compliance  We discussed: Benefits of medication synchronization, packaging and delivery as well as enhanced pharmacist oversight with [facility]. Patient decided to: Continue current medication management strategy  Care Plan and Follow Up Patient Decision:  Patient agrees to Care Plan and Follow-up.  Plan: Telephone follow up appointment  with care management team member scheduled for:  06/29/2021 and The patient has been provided with contact information for the care management team and has  been advised to call with any health related questions or concerns.   [name], PharmD Clinical Pharmacist [facility] [phone]

## 2021-01-12 ENCOUNTER — Ambulatory Visit
Admission: RE | Admit: 2021-01-12 | Discharge: 2021-01-12 | Disposition: A | Discharge status: Home / Self Care | Payer: HMO | Source: Ambulatory Visit | Attending: Nurse Practitioner | Admitting: Nurse Practitioner

## 2021-01-12 ENCOUNTER — Other Ambulatory Visit: Payer: Self-pay

## 2021-01-12 DIAGNOSIS — Z1231 Encounter for screening mammogram for malignant neoplasm of breast: Secondary | ICD-10-CM | POA: Diagnosis not present

## 2021-01-13 ENCOUNTER — Telehealth: Payer: Self-pay

## 2021-01-13 NOTE — Chronic Care Management (AMB) (Signed)
Chronic Care Management Pharmacy Assistant   Name: Shannon [name]  MRN: [id number] DOB: [birth year]  Reason for Encounter: Medication Review/ Medication coordintaion    Recent office visits:  None  Recent consult visits:  None  Hospital visits:  None in previous 6 months  Medications: Outpatient Encounter Medications as of 01/13/2021  Medication Sig   atorvastatin (LIPITOR) 20 MG tablet TAKE ONE TABLET BY MOUTH EVERY EVENING WITH A MEAL   blood glucose meter kit and supplies KIT Dispense based on patient and insurance preference. Use 3 times daily as directed to check blood sugar. Dx code e11.65   Blood Glucose Monitoring Suppl (ONE TOUCH ULTRA MINI) w/Device KIT 1 each by Does not apply route 4 (four) times daily -  before meals and at bedtime.   Cholecalciferol (VITAMIN D-3 PO) Take 1 capsule by mouth daily. 2000 units   Continuous Blood Gluc Receiver (FREESTYLE LIBRE 14 DAY READER) DEVI USE TO CHECK BLOOD SUGARS FOUR TIMES A DAY DX:E11.65   Continuous Blood Gluc Sensor (FREESTYLE LIBRE 2 SENSOR) MISC Check glucose as directed. CHANGE sensor every 14 DAYS   glucose blood test strip Use as instructed   glucose blood test strip Use as instructed to check blood sugar 3 times a day. Dx code e11.65   Lancets (ONETOUCH ULTRASOFT) lancets Use as instructed to check blood sugars 4 times daily E11.9   lisinopril (ZESTRIL) 10 MG tablet Take 1 tablet (10 mg total) by mouth daily.   metFORMIN (GLUCOPHAGE XR) 750 MG 24 hr tablet Take 1 tablet (750 mg total) by mouth daily with breakfast.   Semaglutide,0.25 or 0.5MG/DOS, (OZEMPIC, 0.25 OR 0.5 MG/DOSE,) 2 MG/1.5ML SOPN Inject 0.5 mg into the skin once a week.   TRESIBA FLEXTOUCH 100 UNIT/ML FlexTouch Pen INJECT 21 UNITS into THE SKIN DAILY   No facility-administered encounter medications on file as of 01/13/2021.   Reviewed chart for medication changes ahead of medication coordination call.  No OVs, Consults, or hospital visits since  last care coordination call/Pharmacist visit. (If appropriate, list visit date, provider name)  No medication changes indicated OR if recent visit, treatment plan here.  BP Readings from Last 3 Encounters:  12/15/20 122/70  11/17/20 (!) 118/58  11/17/20 (!) 118/58    Lab Results  Component Value Date   HGBA1C 6.9 (H) 11/17/2020     Patient obtains medications through Adherence Packaging  30 Days   Last adherence delivery included: Metformin 750 mg 1 tablet daily (breakfast) Atorvastatin 20 mg 1 tablet daily(bedtime) Lisinopril 10 mg 1 tablet daily (breakfast) Freestyle Libre Kit 2 Sensors- Use as directed every 14 days Vitamin D3 2000 units- 1 capsule daily (evening meal) Tyler Aas Flextouch - Inject 21 units sq daily Ozempic 0.25 mg weekly  Patient declined the following medications (meds) due to (reason): None   Patient is due for next adherence delivery on: 01-20-2021  Called patient and reviewed medications and coordinated delivery.  This delivery to include: Metformin 750 mg 1 tablet daily (breakfast) Atorvastatin 20 mg 1 tablet daily(bedtime) Lisinopril 10 mg 1 tablet daily (breakfast) Freestyle Libre Kit 2 Sensors- Use as directed every 14 days Vitamin D3 2000 units- 1 capsule daily (evening meal) Tyler Aas Flextouch - Inject 21 units sq daily Ozempic 0.25 mg weekly  No acute or short fill needed  Patient declined the following medications: None   Patient needs refills for: None  Confirmed delivery date of 01-20-2021 advised patient that pharmacy will contact them the morning of delivery.  Care Gaps: Shingrix overdue Covid booster overdue Medicare wellness 11-30-2021 RAF= 0.841 %  Star Rating Drugs: Ozempic 0.5 mg- Patient assistance Metformin ER 750 mg-Last filled 12-16-2020 30 DS [facility] Lisinopril 10 mg- Last filled 12-16-2020 30 DS [facility] Atorvastatin 20 mg- Last filled 12-16-2020 30 DS [facility]  [place] Clinical Pharmacist  Assistant [phone]

## 2021-01-14 DIAGNOSIS — G4733 Obstructive sleep apnea (adult) (pediatric): Secondary | ICD-10-CM | POA: Diagnosis not present

## 2021-01-18 ENCOUNTER — Other Ambulatory Visit: Payer: Self-pay

## 2021-01-18 ENCOUNTER — Ambulatory Visit (INDEPENDENT_AMBULATORY_CARE_PROVIDER_SITE_OTHER): Payer: HMO | Admitting: Nurse Practitioner

## 2021-01-18 ENCOUNTER — Encounter: Payer: Self-pay | Admitting: Nurse Practitioner

## 2021-01-18 VITALS — BP 118/72 | HR 84 | Temp 98.5°F | Ht 63.0 in | Wt 151.6 lb

## 2021-01-18 DIAGNOSIS — Z9989 Dependence on other enabling machines and devices: Secondary | ICD-10-CM | POA: Diagnosis not present

## 2021-01-18 DIAGNOSIS — Z23 Encounter for immunization: Secondary | ICD-10-CM | POA: Diagnosis not present

## 2021-01-18 DIAGNOSIS — I10 Essential (primary) hypertension: Secondary | ICD-10-CM

## 2021-01-18 DIAGNOSIS — G4730 Sleep apnea, unspecified: Secondary | ICD-10-CM

## 2021-01-18 DIAGNOSIS — Z0000 Encounter for general adult medical examination without abnormal findings: Secondary | ICD-10-CM

## 2021-01-18 DIAGNOSIS — E782 Mixed hyperlipidemia: Secondary | ICD-10-CM

## 2021-01-18 DIAGNOSIS — Z862 Personal history of diseases of the blood and blood-forming organs and certain disorders involving the immune mechanism: Secondary | ICD-10-CM

## 2021-01-18 DIAGNOSIS — E119 Type 2 diabetes mellitus without complications: Secondary | ICD-10-CM

## 2021-01-18 DIAGNOSIS — Z794 Long term (current) use of insulin: Secondary | ICD-10-CM

## 2021-01-18 LAB — CBC WITH DIFFERENTIAL/PLATELET
Basophils Absolute: 0 10*3/uL (ref 0.0–0.2)
Basos: 1 %
EOS (ABSOLUTE): 0 10*3/uL (ref 0.0–0.4)
Eos: 0 %
Hematocrit: 35 % (ref 34.0–46.6)
Hemoglobin: 11.3 g/dL (ref 11.1–15.9)
Immature Grans (Abs): 0 10*3/uL (ref 0.0–0.1)
Immature Granulocytes: 0 %
Lymphocytes Absolute: 1.1 10*3/uL (ref 0.7–3.1)
Lymphs: 30 %
MCH: 27.8 pg (ref 26.6–33.0)
MCHC: 32.3 g/dL (ref 31.5–35.7)
MCV: 86 fL (ref 79–97)
Monocytes Absolute: 0.4 10*3/uL (ref 0.1–0.9)
Monocytes: 10 %
Neutrophils Absolute: 2.1 10*3/uL (ref 1.4–7.0)
Neutrophils: 59 %
Platelets: 167 10*3/uL (ref 150–450)
RBC: 4.07 x10E6/uL (ref 3.77–5.28)
RDW: 12.9 % (ref 11.7–15.4)
WBC: 3.6 10*3/uL (ref 3.4–10.8)

## 2021-01-18 MED ORDER — ZOSTER VAC RECOMB ADJUVANTED 50 MCG/0.5ML IM SUSR: 0.5000 mL | Freq: Once | INTRAMUSCULAR | 0 refills | Status: AC

## 2021-01-18 NOTE — Progress Notes (Signed)
I,Yamilka Roman [employer] as a Education administrator for [employer], FNP.,have documented all relevant documentation on the behalf of [name], FNP,as directed by  [name], FNP while in the presence of [name], [place].  This visit occurred during the SARS-CoV-2 public health emergency.  Safety protocols were in place, including screening questions prior to the visit, additional usage of staff PPE, and extensive cleaning of exam room while observing appropriate contact time as indicated for disinfecting solutions.  Subjective:     Patient ID: Shannon [name] , female    DOB: 13-Feb-1956 , 65 y.o.   MRN: [id number]   Chief Complaint  Patient presents with   Annual Exam    HPI  Patient here for hm. She has a eye exam coming up next month with her eye doctor. At her last visit she had been out of the [place].  She has just had a covid booster last week.    Wt Readings from Last 3 Encounters: 01/18/21 : 151 lb 9.6 oz (68.8 kg) 12/15/20 : 155 lb 3.2 oz (70.4 kg) 11/17/20 : 154 lb 8.7 oz (70.1 kg)      Past Medical History:  Diagnosis Date   COVID-19 virus infection 10/13/2018   Diabetes mellitus without complication ([place])    Hypercholesteremia    Hypertension    Sleep apnea    Newly diagnosed in September 2021     Family History  Problem Relation Age of Onset   Hypertension Mother    Diabetes Mother    Stroke Mother    Diabetes Father    Hypertension Father    Stroke Father    Hypertension Son    Diabetes Son    Breast cancer Neg Hx      Current Outpatient Medications:    atorvastatin (LIPITOR) 20 MG tablet, TAKE ONE TABLET BY MOUTH EVERY EVENING WITH A MEAL, Disp: 90 tablet, Rfl: 1   blood glucose meter kit and supplies KIT, Dispense based on patient and insurance preference. Use 3 times daily as directed to check blood sugar. Dx code e11.65, Disp: 1 each, Rfl: 9   Blood Glucose Monitoring Suppl (ONE TOUCH ULTRA MINI) w/Device KIT, 1 each by Does not apply route 4  (four) times daily -  before meals and at bedtime., Disp: 1 kit, Rfl: 0   Cholecalciferol (VITAMIN D-3 PO), Take 1 capsule by mouth daily. 2000 units, Disp: , Rfl:    Continuous Blood Gluc Receiver (FREESTYLE LIBRE 14 DAY READER) DEVI, USE TO CHECK BLOOD SUGARS FOUR TIMES A DAY DX:E11.65, Disp: 1 each, Rfl: 1   Continuous Blood Gluc Sensor (FREESTYLE LIBRE 2 SENSOR) MISC, Check glucose as directed. CHANGE sensor every 14 DAYS, Disp: 2 each, Rfl: 1   glucose blood test strip, Use as instructed, Disp: 100 each, Rfl: 5   glucose blood test strip, Use as instructed to check blood sugar 3 times a day. Dx code e11.65, Disp: 100 each, Rfl: 12   Lancets (ONETOUCH ULTRASOFT) lancets, Use as instructed to check blood sugars 4 times daily E11.9, Disp: 300 each, Rfl: 3   lisinopril (ZESTRIL) 10 MG tablet, Take 1 tablet (10 mg total) by mouth daily., Disp: 90 tablet, Rfl: 0   metFORMIN (GLUCOPHAGE XR) 750 MG 24 hr tablet, Take 1 tablet (750 mg total) by mouth daily with breakfast., Disp: 90 tablet, Rfl: 0   Semaglutide,0.25 or 0.5MG/DOS, (OZEMPIC, 0.25 OR 0.5 MG/DOSE,) 2 MG/1.5ML SOPN, Inject 0.5 mg into the skin once a week., Disp: 4.5 mL,  Rfl: 1   TRESIBA FLEXTOUCH 100 UNIT/ML FlexTouch Pen, INJECT 21 UNITS into THE SKIN DAILY, Disp: 15 mL, Rfl: 1   Zoster Vaccine Adjuvanted (SHINGRIX) injection, Inject 0.5 mLs into the muscle once for 1 dose., Disp: 0.5 mL, Rfl: 0   No Known Allergies    The patient states she uses status post hysterectomy for birth control.  No LMP recorded. Patient has had a hysterectomy.. Negative for Dysmenorrhea and Negative for Menorrhagia. Negative for: breast discharge, breast lump(s), breast pain and breast self exam. Associated symptoms include abnormal vaginal bleeding. Pertinent negatives include abnormal bleeding (hematology), anxiety, decreased libido, depression, difficulty falling sleep, dyspareunia, history of infertility, nocturia, sexual dysfunction, sleep disturbances,  urinary incontinence, urinary urgency, vaginal discharge and vaginal itching. Diet regular; eating 1000-1100 calories a day.  The patient states her exercise level is moderate with water aerobics one day a week and silver sneakers two days.   The patient's tobacco use is:  Social History   Tobacco Use  Smoking Status Former   Types: Cigarettes   Quit date: 2006   Years since quitting: 16.5  Smokeless Tobacco Never  Tobacco Comments   quit 10 yrs   She has been exposed to passive smoke. The patient's alcohol use is:  Social History   Substance and Sexual Activity  Alcohol Use Yes   Comment: 1 glass of wine sometimes daily     Review of Systems  Constitutional: Negative.   HENT: Negative.    Eyes: Negative.   Respiratory: Negative.    Cardiovascular: Negative.   Gastrointestinal: Negative.   Endocrine: Negative.   Genitourinary: Negative.   Musculoskeletal: Negative.   Skin: Negative.   Allergic/Immunologic: Negative.   Neurological: Negative.   Hematological: Negative.   Psychiatric/Behavioral: Negative.      Today's Vitals   01/18/21 1000  BP: 118/72  Pulse: 84  Temp: 98.5 F (36.9 C)  Weight: 151 lb 9.6 oz (68.8 kg)  Height: 5' 3"  (1.6 m)   Body mass index is 26.85 kg/m.   Objective:  Physical Exam Constitutional:      General: She is not in acute distress.    Appearance: Normal appearance. She is well-developed. She is obese.  HENT:     Head: Normocephalic and atraumatic.     Right Ear: Hearing, tympanic membrane, ear canal and external ear normal. There is no impacted cerumen.     Left Ear: Hearing, tympanic membrane, ear canal and external ear normal. There is no impacted cerumen.     Nose:     Comments: Deferred - masked    Mouth/Throat:     Comments: Deferred - masked Eyes:     General: Lids are normal.     Extraocular Movements: Extraocular movements intact.     Conjunctiva/sclera: Conjunctivae normal.     Pupils: Pupils are equal, round, and  reactive to light.     Funduscopic exam:    Right eye: No papilledema.        Left eye: No papilledema.  Neck:     Thyroid: No thyroid mass.     Vascular: No carotid bruit.  Cardiovascular:     Rate and Rhythm: Normal rate and regular rhythm.     Pulses: Normal pulses.     Heart sounds: Normal heart sounds. No murmur heard. Pulmonary:     Effort: Pulmonary effort is normal.     Breath sounds: Normal breath sounds.  Chest:     Chest wall: No mass.  Breasts:  Tanner Score is 5.     Right: Normal. No mass, tenderness, axillary adenopathy or supraclavicular adenopathy.     Left: Normal. No mass, tenderness, axillary adenopathy or supraclavicular adenopathy.  Abdominal:     General: Abdomen is flat. Bowel sounds are normal. There is no distension.     Palpations: Abdomen is soft.     Tenderness: There is no abdominal tenderness.  Genitourinary:    Rectum: Guaiac result negative.  Musculoskeletal:        General: No swelling or tenderness. Normal range of motion.     Cervical back: Full passive range of motion without pain, normal range of motion and neck supple.     Right lower leg: No edema.     Left lower leg: No edema.  Lymphadenopathy:     Upper Body:     Right upper body: No supraclavicular, axillary or pectoral adenopathy.     Left upper body: No supraclavicular, axillary or pectoral adenopathy.  Skin:    General: Skin is warm and dry.     Capillary Refill: Capillary refill takes less than 2 seconds.     Coloration: Skin is not jaundiced.     Findings: No bruising.  Neurological:     General: No focal deficit present.     Mental Status: She is alert and oriented to person, place, and time.     Cranial Nerves: No cranial nerve deficit.     Sensory: No sensory deficit.  Psychiatric:        Mood and Affect: Mood normal.        Behavior: Behavior normal.        Thought Content: Thought content normal.        Judgment: Judgment normal.        Assessment And Plan:      1. Encounter for general adult medical examination w/o abnormal findings Behavior modifications discussed and diet history reviewed.   Pt will continue to exercise regularly and modify diet with low GI, plant based foods and decrease intake of processed foods.  Recommend intake of daily multivitamin, Vitamin D, and calcium.  Recommend mammogram and colonoscopy (up to date) for preventive screenings, as well as recommend immunizations that include influenza, TDAP, and Shingles (she will get in one month)  2. Mixed hyperlipidemia Chronic, controlled Continue with current medications  3. Type 2 diabetes mellitus without complication, with long-term current use of insulin (HCC) Chronic, fair control Continue with current medications, tolerating Ozempic and tresiba well Encouraged to limit intake of sugary foods and drinks continue to increase physical activity to 150 minutes per week Diabetic foot exam done, no abnormal findings Eye exam is up to date  4. Essential hypertension Comments: Excellent control, continue with lisinopril tolerating well.  EKG done with NSR with HR 85 - EKG 12-Lead  5. Immunization due Sent Rx to pharmacy will get in 1 month has just had her covid booster last week - Zoster Vaccine Adjuvanted [facility]) injection; Inject 0.5 mLs into the muscle once for 1 dose.  Dispense: 0.5 mL; Refill: 0  6. History of thrombocytopenia - CBC with Differential/Platelet  7. Sleep apnea in adult Overall doing well, using CPAP now and becoming adjusted  8. CPAP (continuous positive airway pressure) dependence Continue with CPAP    Patient was given opportunity to ask questions. Patient verbalized understanding of the plan and was able to repeat key elements of the plan. All questions were answered to their satisfaction.   [name], FNP  Teola Bradley, FNP, have reviewed all documentation for this visit. The documentation on 01/18/21 for the exam, diagnosis,  procedures, and orders are all accurate and complete.   THE PATIENT IS ENCOURAGED TO PRACTICE SOCIAL DISTANCING DUE TO THE COVID-19 PANDEMIC.

## 2021-01-18 NOTE — Patient Instructions (Signed)
Health Maintenance, Female Adopting a healthy lifestyle and getting preventive care are important in promoting health and wellness. Ask your health care provider about: The right schedule for you to have regular tests and exams. Things you can do on your own to prevent diseases and keep yourself healthy. What should I know about diet, weight, and exercise? Eat a healthy diet  Eat a diet that includes plenty of vegetables, fruits, low-fat dairy products, and lean protein. Do not eat a lot of foods that are high in solid fats, added sugars, or sodium.  Maintain a healthy weight Body mass index (BMI) is used to identify weight problems. It estimates body fat based on height and weight. Your health care provider can help determineyour BMI and help you achieve or maintain a healthy weight. Get regular exercise Get regular exercise. This is one of the most important things you can do for your health. Most adults should: Exercise for at least 150 minutes each week. The exercise should increase your heart rate and make you sweat (moderate-intensity exercise). Do strengthening exercises at least twice a week. This is in addition to the moderate-intensity exercise. Spend less time sitting. Even light physical activity can be beneficial. Watch cholesterol and blood lipids Have your blood tested for lipids and cholesterol at 67 years of age, then havethis test every 5 years. Have your cholesterol levels checked more often if: Your lipid or cholesterol levels are high. You are older than 67 years of age. You are at high risk for heart disease. What should I know about cancer screening? Depending on your health history and family history, you may need to have cancer screening at various ages. This may include screening for: Breast cancer. Cervical cancer. Colorectal cancer. Skin cancer. Lung cancer. What should I know about heart disease, diabetes, and high blood pressure? Blood pressure and heart  disease High blood pressure causes heart disease and increases the risk of stroke. This is more likely to develop in people who have high blood pressure readings, are of African descent, or are overweight. Have your blood pressure checked: Every 3-5 years if you are 18-39 years of age. Every year if you are 40 years old or older. Diabetes Have regular diabetes screenings. This checks your fasting blood sugar level. Have the screening done: Once every three years after age 40 if you are at a normal weight and have a low risk for diabetes. More often and at a younger age if you are overweight or have a high risk for diabetes. What should I know about preventing infection? Hepatitis B If you have a higher risk for hepatitis B, you should be screened for this virus. Talk with your health care provider to find out if you are at risk forhepatitis B infection. Hepatitis C Testing is recommended for: Everyone born from 1945 through 1965. Anyone with known risk factors for hepatitis C. Sexually transmitted infections (STIs) Get screened for STIs, including gonorrhea and chlamydia, if: You are sexually active and are younger than 67 years of age. You are older than 67 years of age and your health care provider tells you that you are at risk for this type of infection. Your sexual activity has changed since you were last screened, and you are at increased risk for chlamydia or gonorrhea. Ask your health care provider if you are at risk. Ask your health care provider about whether you are at high risk for HIV. Your health care provider may recommend a prescription medicine to help   prevent HIV infection. If you choose to take medicine to prevent HIV, you should first get tested for HIV. You should then be tested every 3 months for as long as you are taking the medicine. Pregnancy If you are about to stop having your period (premenopausal) and you may become pregnant, seek counseling before you get  pregnant. Take 400 to 800 micrograms (mcg) of folic acid every day if you become pregnant. Ask for birth control (contraception) if you want to prevent pregnancy. Osteoporosis and menopause Osteoporosis is a disease in which the bones lose minerals and strength with aging. This can result in bone fractures. If you are 65 years old or older, or if you are at risk for osteoporosis and fractures, ask your health care provider if you should: Be screened for bone loss. Take a calcium or vitamin D supplement to lower your risk of fractures. Be given hormone replacement therapy (HRT) to treat symptoms of menopause. Follow these instructions at home: Lifestyle Do not use any products that contain nicotine or tobacco, such as cigarettes, e-cigarettes, and chewing tobacco. If you need help quitting, ask your health care provider. Do not use street drugs. Do not share needles. Ask your health care provider for help if you need support or information about quitting drugs. Alcohol use Do not drink alcohol if: Your health care provider tells you not to drink. You are pregnant, may be pregnant, or are planning to become pregnant. If you drink alcohol: Limit how much you use to 0-1 drink a day. Limit intake if you are breastfeeding. Be aware of how much alcohol is in your drink. In the [place] one drink equals one 12 oz bottle of beer (355 mL), one 5 oz glass of wine (148 mL), or one 1 oz glass of hard liquor (44 mL). General instructions Schedule regular health, dental, and eye exams. Stay current with your vaccines. Tell your health care provider if: You often feel depressed. You have ever been abused or do not feel safe at home. Summary Adopting a healthy lifestyle and getting preventive care are important in promoting health and wellness. Follow your health care provider's instructions about healthy diet, exercising, and getting tested or screened for diseases. Follow your health care provider's  instructions on monitoring your cholesterol and blood pressure. This information is not intended to replace advice given to you by your health care provider. Make sure you discuss any questions you have with your healthcare provider. Document Revised: 06/04/2018 Document Reviewed: 06/04/2018 Elsevier Patient Education  2022 [employer]  

## 2021-02-08 ENCOUNTER — Encounter (INDEPENDENT_AMBULATORY_CARE_PROVIDER_SITE_OTHER): Payer: Self-pay | Admitting: Ophthalmology

## 2021-02-08 ENCOUNTER — Other Ambulatory Visit: Payer: Self-pay

## 2021-02-08 ENCOUNTER — Ambulatory Visit (INDEPENDENT_AMBULATORY_CARE_PROVIDER_SITE_OTHER): Payer: HMO | Admitting: Ophthalmology

## 2021-02-08 DIAGNOSIS — E113312 Type 2 diabetes mellitus with moderate nonproliferative diabetic retinopathy with macular edema, left eye: Secondary | ICD-10-CM | POA: Diagnosis not present

## 2021-02-08 DIAGNOSIS — G4730 Sleep apnea, unspecified: Secondary | ICD-10-CM | POA: Diagnosis not present

## 2021-02-08 DIAGNOSIS — E113311 Type 2 diabetes mellitus with moderate nonproliferative diabetic retinopathy with macular edema, right eye: Secondary | ICD-10-CM

## 2021-02-08 DIAGNOSIS — G4733 Obstructive sleep apnea (adult) (pediatric): Secondary | ICD-10-CM | POA: Diagnosis not present

## 2021-02-08 MED ORDER — BEVACIZUMAB 2.5 MG/0.1ML IZ SOSY
2.5000 mg | PREFILLED_SYRINGE | INTRAVITREAL | Status: AC | PRN
  Administered 2021-02-08: 2.5 mg via INTRAVITREAL

## 2021-02-08 NOTE — Assessment & Plan Note (Signed)
CSME OD continues slow steady improvement on the temporal aspect of the FAZ and superiorly.  Currently at 10-week follow-up today with improved thickness, repeat injection today and examination again in 3 months  May consider fluorescein angiography next to delineate the extent of diabetic retinopathy

## 2021-02-08 NOTE — Assessment & Plan Note (Signed)
Mild AHI index of 7.1 upon original testing according to patient report   Continues on sleep apnea therapy

## 2021-02-08 NOTE — Assessment & Plan Note (Signed)
Very minor OS overall and yet some less thickening diffusely as well with no ongoing specific treatment ocular therapy

## 2021-02-08 NOTE — Progress Notes (Signed)
02/08/2021     CHIEF COMPLAINT Patient presents for Retina Follow Up   HISTORY OF PRESENT ILLNESS: Shannon [name] is a 65 y.o. female who presents to the clinic today for:   HPI     Retina Follow Up           Diagnosis: Diabetic Retinopathy   Laterality: right eye   Onset: 10 weeks ago   Severity: mild   Duration: 10 weeks   Course: stable         Comments   10 week fu OD and Avastin OD Pt states VA OU stable since last visit. Pt denies FOL, floaters, or ocular pain OU.  A1C: 6.9 LBS: 139      Last edited by Kendra Opitz, COA on 02/08/2021  8:37 AM.      Referring physician: [name], [place] [address] [place],  [place] [address]  HISTORICAL INFORMATION:   Selected notes from the MEDICAL RECORD NUMBER    Lab Results  Component Value Date   HGBA1C 6.9 (H) 11/17/2020     CURRENT MEDICATIONS: No current outpatient medications on file. (Ophthalmic Drugs)   No current facility-administered medications for this visit. (Ophthalmic Drugs)   Current Outpatient Medications (Other)  Medication Sig   atorvastatin (LIPITOR) 20 MG tablet TAKE ONE TABLET BY MOUTH EVERY EVENING WITH A MEAL   blood glucose meter kit and supplies KIT Dispense based on patient and insurance preference. Use 3 times daily as directed to check blood sugar. Dx code e11.65   Blood Glucose Monitoring Suppl (ONE TOUCH ULTRA MINI) w/Device KIT 1 each by Does not apply route 4 (four) times daily -  before meals and at bedtime.   Cholecalciferol (VITAMIN D-3 PO) Take 1 capsule by mouth daily. 2000 units   Continuous Blood Gluc Receiver (FREESTYLE LIBRE 14 DAY READER) DEVI USE TO CHECK BLOOD SUGARS FOUR TIMES A DAY DX:E11.65   Continuous Blood Gluc Sensor (FREESTYLE LIBRE 2 SENSOR) MISC Check glucose as directed. CHANGE sensor every 14 DAYS   glucose blood test strip Use as instructed   glucose blood test strip Use as instructed to check blood sugar 3 times a day. Dx code  e11.65   Lancets (ONETOUCH ULTRASOFT) lancets Use as instructed to check blood sugars 4 times daily E11.9   lisinopril (ZESTRIL) 10 MG tablet Take 1 tablet (10 mg total) by mouth daily.   metFORMIN (GLUCOPHAGE XR) 750 MG 24 hr tablet Take 1 tablet (750 mg total) by mouth daily with breakfast.   Semaglutide,0.25 or 0.5MG/DOS, (OZEMPIC, 0.25 OR 0.5 MG/DOSE,) 2 MG/1.5ML SOPN Inject 0.5 mg into the skin once a week.   TRESIBA FLEXTOUCH 100 UNIT/ML FlexTouch Pen INJECT 21 UNITS into THE SKIN DAILY   No current facility-administered medications for this visit. (Other)      REVIEW OF SYSTEMS:    ALLERGIES No Known Allergies  PAST MEDICAL HISTORY Past Medical History:  Diagnosis Date   COVID-19 virus infection 10/13/2018   Diabetes mellitus without complication ([place])    Hypercholesteremia    Hypertension    Sleep apnea    Newly diagnosed in September 2021   Past Surgical History:  Procedure Laterality Date   ABDOMINAL HYSTERECTOMY     CATARACT EXTRACTION Bilateral    CESAREAN SECTION     MYOMECTOMY      FAMILY HISTORY Family History  Problem Relation Age of Onset   Hypertension Mother    Diabetes Mother    Stroke  Mother    Diabetes Father    Hypertension Father    Stroke Father    Hypertension Son    Diabetes Son    Breast cancer Neg Hx     SOCIAL HISTORY Social History   Tobacco Use   Smoking status: Former    Types: Cigarettes    Quit date: 2006    Years since quitting: 16.6   Smokeless tobacco: Never   Tobacco comments:    quit 10 yrs  Vaping Use   Vaping Use: Never used  Substance Use Topics   Alcohol use: Yes    Comment: 1 glass of wine sometimes daily    Drug use: Never         OPHTHALMIC EXAM:  Base Eye Exam     Visual Acuity (ETDRS)       Right Left   Dist [place] 20/20 -1 20/25 -1         Tonometry (Tonopen, 8:39 AM)       Right Left   Pressure 12 13         Pupils       Pupils Dark Light Shape React APD   Right PERRL 3 2  Round Brisk None   Left PERRL 3 2 Round Brisk None         Visual Fields (Counting fingers)       Left Right    Full Full         Extraocular Movement       Right Left    Full Full         Neuro/Psych     Oriented x3: Yes   Mood/Affect: Normal         Dilation     Right eye: 1.0% Mydriacyl, 2.5% Phenylephrine @ 8:39 AM           Slit Lamp and Fundus Exam     External Exam       Right Left   External Normal Normal         Slit Lamp Exam       Right Left   Lids/Lashes Normal Normal   Conjunctiva/Sclera White and quiet White and quiet   Cornea Clear Clear   Anterior Chamber Deep and quiet Deep and quiet   Iris Round and reactive Round and reactive   Lens Posterior chamber intraocular lens Posterior chamber intraocular lens   Anterior Vitreous Normal Normal         Fundus Exam       Right Left   Posterior Vitreous Normal    Disc Normal    C/D Ratio 0.2    Macula  Exudates, Macular thickening, Mild clinically significant macular edema    Vessels NPDR- Moderate    Periphery Normal             IMAGING AND PROCEDURES  Imaging and Procedures for 02/08/21  OCT, Retina - OU - Both Eyes       Right Eye Quality was good. Scan locations included subfoveal. Central Foveal Thickness: 286. Progression has improved. Findings include abnormal foveal contour.   Left Eye Quality was good. Scan locations included subfoveal. Central Foveal Thickness: 305. Progression has been stable. Findings include normal foveal contour.   Notes CSME OD, now responsive to intravitreal Avastin since patient has commence therapy of sleep apnea and its potential hypoxic damage nightly to macular perfusion.  Currently at 10-week follow-up.  Currently at 10-week follow-up.  We will repeat injection today and examination  again in 12 weeks     Intravitreal Injection, Pharmacologic Agent - OD - Right Eye       Time Out 02/08/2021. 9:05 AM. Confirmed correct  patient, procedure, site, and patient consented.   Anesthesia Topical anesthesia was used. Anesthetic medications included [place] 3.5%.   Procedure Preparation included Tobramycin 0.3%, 5% betadine to ocular surface, 10% betadine to eyelids. A 30 gauge needle was used.   Injection: 2.5 mg bevacizumab 2.5 MG/0.1ML   Route: Intravitreal, Site: Right Eye   NDC: [phone], Lot: [id number]   Post-op Post injection exam found visual acuity of at least counting fingers. The patient tolerated the procedure well. There were no complications. The patient received written and verbal post procedure care education. Post injection medications were not given.              ASSESSMENT/PLAN:  Sleep apnea in adult Mild AHI index of 7.1 upon original testing according to patient report   Continues on sleep apnea therapy  Moderate nonproliferative diabetic retinopathy of left eye with macular edema associated with type 2 diabetes mellitus (HCC) Very minor OS overall and yet some less thickening diffusely as well with no ongoing specific treatment ocular therapy  Moderate nonproliferative diabetic retinopathy of right eye with macular edema (HCC) CSME OD continues slow steady improvement on the temporal aspect of the FAZ and superiorly.  Currently at 10-week follow-up today with improved thickness, repeat injection today and examination again in 3 months  May consider fluorescein angiography next to delineate the extent of diabetic retinopathy     ICD-10-CM   1. Moderate nonproliferative diabetic retinopathy of right eye with macular edema associated with type 2 diabetes mellitus (HCC)  E11.3311 OCT, Retina - OU - Both Eyes    Intravitreal Injection, Pharmacologic Agent - OD - Right Eye    bevacizumab (AVASTIN) SOSY 2.5 mg    2. Sleep apnea in adult  G47.30     3. Moderate nonproliferative diabetic retinopathy of left eye with macular edema associated with type 2 diabetes mellitus ([place])   D82.6415       1.  Moderate NPDR with CSME active OD continues a slow steady improvement particularly since onset of therapy for mild apnea, particularly since onset of therapy September 2021.  Now 10-week follow-up.  Improving we will extend interval next to 12 weeks  2.  Dilate OU next likely FFA OD OS to delineate extent of diabetic retinopathy  3.  Ophthalmic Meds Ordered this visit:  Meds ordered this encounter  Medications   bevacizumab (AVASTIN) SOSY 2.5 mg       Return in about 3 months (around 05/11/2021) for DILATE OU, COLOR FP, OPTOS FFA R/L, OCT.  There are no Patient Instructions on file for this visit.   Explained the diagnoses, plan, and follow up with the patient and they expressed understanding.  Patient expressed understanding of the importance of proper follow up care.   Clent Demark Lyndie Vanderloop M.D. Diseases & Surgery of the Retina and Vitreous [facility] [place] 02/08/21     Abbreviations: M myopia (nearsighted); A astigmatism; H hyperopia (farsighted); P presbyopia; Mrx spectacle prescription;  CTL contact lenses; OD right eye; OS left eye; OU both eyes  XT exotropia; ET esotropia; PEK punctate epithelial keratitis; PEE punctate epithelial erosions; DES dry eye syndrome; MGD meibomian gland dysfunction; ATs artificial tears; PFAT's preservative free artificial tears; [place] nuclear sclerotic cataract; PSC posterior subcapsular cataract; ERM epi-retinal membrane; PVD posterior vitreous detachment; RD retinal detachment; DM  diabetes mellitus; DR diabetic retinopathy; NPDR non-proliferative diabetic retinopathy; PDR proliferative diabetic retinopathy; CSME clinically significant macular edema; DME diabetic macular edema; dbh dot blot hemorrhages; CWS cotton wool spot; POAG primary open angle glaucoma; C/D cup-to-disc ratio; HVF humphrey visual field; GVF goldmann visual field; OCT optical coherence tomography; IOP intraocular pressure; BRVO Branch retinal vein  occlusion; CRVO central retinal vein occlusion; CRAO central retinal artery occlusion; BRAO branch retinal artery occlusion; RT retinal tear; SB scleral buckle; PPV pars plana vitrectomy; VH Vitreous hemorrhage; PRP panretinal laser photocoagulation; IVK intravitreal kenalog; VMT vitreomacular traction; MH Macular hole;  NVD neovascularization of the disc; NVE neovascularization elsewhere; AREDS age related eye disease study; ARMD age related macular degeneration; POAG primary open angle glaucoma; EBMD epithelial/anterior basement membrane dystrophy; ACIOL anterior chamber intraocular lens; IOL intraocular lens; PCIOL posterior chamber intraocular lens; Phaco/IOL phacoemulsification with intraocular lens placement; [place] photorefractive keratectomy; LASIK laser assisted in situ keratomileusis; HTN hypertension; DM diabetes mellitus; COPD chronic obstructive pulmonary disease

## 2021-02-10 ENCOUNTER — Telehealth: Payer: Self-pay

## 2021-02-10 NOTE — Chronic Care Management (AMB) (Signed)
Chronic Care Management Pharmacy Assistant   Name: Shannon [name]  MRN: [id number] DOB: April 06, 1956  Reason for Encounter: Medication Review/Medication Coordination Call   Recent office visits:  01/18/2021- [name], FNP (PCP)- Zoster Vaccine Injection ordered.   Recent consult visits:  02/08/2021- Deloria Lair, MD (Retina Specialist)- Intravitreal Injection, Pharmacologic Agent - OD - Right Eye     bevacizumab (AVASTIN) SOSY 2.5 mg given at visit.    Hospital visits:  None in previous 6 months  Reviewed chart for medication changes ahead of medication coordination call.  No medication changes indicated.  BP Readings from Last 3 Encounters:  01/18/21 118/72  12/15/20 122/70  11/17/20 (!) 118/58    Lab Results  Component Value Date   HGBA1C 6.9 (H) 11/17/2020     Patient obtains medications through Adherence Packaging  30 Days   Last adherence delivery included:  Metformin 750 mg 1 tablet daily (breakfast) Atorvastatin 20 mg 1 tablet daily(bedtime) Lisinopril 10 mg 1 tablet daily (breakfast) Freestyle Libre Kit 2 Sensors- Use as directed every 14 days Vitamin D3 2000 units- 1 capsule daily (evening meal) Tyler Aas Flextouch - Inject 21 units sq daily Ozempic 0.25 mg weekly  Patient did not decline any medications last month.   Patient is due for next adherence delivery on: 02/20/2021. 02/10/2021- Called patient to review medications and coordinated delivery, no answer, left message to return call. 02/13/2021- Called patient again, no answer, left message to return call.   02/20/2021- No return call from patient, [facility] delivered medications 02/20/2021.   This delivery included: Metformin 750 mg 1 tablet daily (breakfast) Atorvastatin 20 mg 1 tablet daily(bedtime) Lisinopril 10 mg 1 tablet daily (breakfast) Freestyle Libre Kit 2 Sensors- Use as directed every 14 days Vitamin D3 2000 units- 1 capsule daily (evening meal) Tyler Aas Flextouch - Inject 21  units sq daily Ozempic 0.25 mg weekly   Medications: Outpatient Encounter Medications as of 02/10/2021  Medication Sig   atorvastatin (LIPITOR) 20 MG tablet TAKE ONE TABLET BY MOUTH EVERY EVENING WITH A MEAL   blood glucose meter kit and supplies KIT Dispense based on patient and insurance preference. Use 3 times daily as directed to check blood sugar. Dx code e11.65   Blood Glucose Monitoring Suppl (ONE TOUCH ULTRA MINI) w/Device KIT 1 each by Does not apply route 4 (four) times daily -  before meals and at bedtime.   Cholecalciferol (VITAMIN D-3 PO) Take 1 capsule by mouth daily. 2000 units   Continuous Blood Gluc Receiver (FREESTYLE LIBRE 14 DAY READER) DEVI USE TO CHECK BLOOD SUGARS FOUR TIMES A DAY DX:E11.65   Continuous Blood Gluc Sensor (FREESTYLE LIBRE 2 SENSOR) MISC Check glucose as directed. CHANGE sensor every 14 DAYS   glucose blood test strip Use as instructed   glucose blood test strip Use as instructed to check blood sugar 3 times a day. Dx code e11.65   Lancets (ONETOUCH ULTRASOFT) lancets Use as instructed to check blood sugars 4 times daily E11.9   lisinopril (ZESTRIL) 10 MG tablet Take 1 tablet (10 mg total) by mouth daily.   metFORMIN (GLUCOPHAGE XR) 750 MG 24 hr tablet Take 1 tablet (750 mg total) by mouth daily with breakfast.   Semaglutide,0.25 or 0.5MG/DOS, (OZEMPIC, 0.25 OR 0.5 MG/DOSE,) 2 MG/1.5ML SOPN Inject 0.5 mg into the skin once a week.   TRESIBA FLEXTOUCH 100 UNIT/ML FlexTouch Pen INJECT 21 UNITS into THE SKIN DAILY   No facility-administered encounter medications on file as of 02/10/2021.  Care Gaps: Zoster Vaccines- Shingrix- Never done INFLUENZA VACCINE- Last completed 03/22/2020.  Star Rating Drugs: Atorvastatin 20 mg- Last filled 02/14/2021 for 30 day supply at [facility] Metformin 750 mg- Last filled 02/14/2021 for 30 day supply at [facility] Lisinopril 10 mg- Last filled 02/14/2021 for 30 day supply at [place], [place] Pharmacist Assistant [phone]

## 2021-02-14 DIAGNOSIS — G4733 Obstructive sleep apnea (adult) (pediatric): Secondary | ICD-10-CM | POA: Diagnosis not present

## 2021-02-17 ENCOUNTER — Telehealth: Payer: Self-pay

## 2021-02-17 NOTE — Chronic Care Management (AMB) (Addendum)
Received a message from [facility] stating patient's Ozempic copay was 200 dollars. Informed pharmacist that patient was approved for patient assistance and she wouldn't need [facility]. [facility] patient assistance and was informed patient's ozempic was delivered to office on 01-26-2021. Patient states she has a few pens left and will pick up the rest at her appointment this week.  Huey Romans [facility] Clinical Pharmacist Assistant [phone]

## 2021-02-22 ENCOUNTER — Other Ambulatory Visit: Payer: Self-pay

## 2021-02-22 ENCOUNTER — Encounter: Payer: Self-pay | Admitting: Nurse Practitioner

## 2021-02-22 ENCOUNTER — Ambulatory Visit (INDEPENDENT_AMBULATORY_CARE_PROVIDER_SITE_OTHER): Payer: HMO | Admitting: Nurse Practitioner

## 2021-02-22 VITALS — BP 122/70 | HR 85 | Temp 98.1°F | Ht 63.0 in | Wt 149.8 lb

## 2021-02-22 DIAGNOSIS — Z2821 Immunization not carried out because of patient refusal: Secondary | ICD-10-CM

## 2021-02-22 DIAGNOSIS — I700 Atherosclerosis of aorta: Secondary | ICD-10-CM

## 2021-02-22 DIAGNOSIS — E119 Type 2 diabetes mellitus without complications: Secondary | ICD-10-CM

## 2021-02-22 DIAGNOSIS — Z794 Long term (current) use of insulin: Secondary | ICD-10-CM | POA: Diagnosis not present

## 2021-02-22 DIAGNOSIS — Z23 Encounter for immunization: Secondary | ICD-10-CM

## 2021-02-22 LAB — POCT URINALYSIS DIPSTICK
Bilirubin, UA: NEGATIVE
Blood, UA: NEGATIVE
Glucose, UA: NEGATIVE
Ketones, UA: NEGATIVE
Nitrite, UA: NEGATIVE
Protein, UA: NEGATIVE
Spec Grav, UA: >=1.03 — AB (ref 1.010–1.025)
Urobilinogen, UA: 0.2 E.U./dL
pH, UA: 5.5 (ref 5.0–8.0)

## 2021-02-22 MED ORDER — SHINGRIX 50 MCG/0.5ML IM SUSR: 0.5000 mL | Freq: Once | INTRAMUSCULAR | 1 refills | Status: AC

## 2021-02-22 MED ORDER — GLUCAGEN HYPOKIT 1 MG IJ SOLR: 1.0000 mg | Freq: Once | INTRAMUSCULAR | 5 refills | Status: AC | PRN

## 2021-02-22 NOTE — Addendum Note (Signed)
Addended by: Harle Battiest on: 02/22/2021 04:32 PM   Modules accepted: Orders

## 2021-02-22 NOTE — Progress Notes (Signed)
I,Shannon [name],acting as a Education administrator for Shannon Brine, FNP.,have documented all relevant documentation on the behalf of Shannon Brine, FNP,as directed by  Shannon Brine, FNP while in the presence of Shannon [name], [place].  This visit occurred during the SARS-CoV-2 public health emergency.  Safety protocols were in place, including screening questions prior to the visit, additional usage of staff PPE, and extensive cleaning of exam room while observing appropriate contact time as indicated for disinfecting solutions.  Subjective:     Patient ID: Shannon [name] , female    DOB: [birth year] , 65 y.o.   MRN: [id number]   Chief Complaint  Patient presents with   Hypertension     HPI  Pt is here today for BP & DM f/u. She would like to get her shingles vaccine.  Wt Readings from Last 3 Encounters: 02/22/21 : 149 lb 12.8 oz (67.9 kg) 01/18/21 : 151 lb 9.6 oz (68.8 kg) 12/15/20 : 155 lb 3.2 oz (70.4 kg)    Diabetes She presents for her follow-up diabetic visit. She has type 2 diabetes mellitus. Her disease course has been stable. Pertinent negatives for hypoglycemia include no confusion, dizziness or nervousness/anxiousness. Pertinent negatives for diabetes include no fatigue, no polydipsia, no polyphagia and no polyuria. There are no hypoglycemic complications. Symptoms are stable. There are no diabetic complications. Risk factors for coronary artery disease include diabetes mellitus, hypertension and obesity. Current diabetic treatment includes oral agent (dual therapy). She is compliant with treatment all of the time (she has only been taking 0.25 mg weekly, trying to spread out her medication when she did not have insurance). She is following a generally healthy diet. When asked about meal planning, she reported none. She has not had a previous visit with a dietitian. She participates in exercise every other day. Her home blood glucose trend is increasing steadily. Her overall blood glucose range is  140-180 mg/dl. (She wears the [place].   Blood sugar today was 91, she has been having low blood sugar of 68 a few days in a row, no symptoms. Currently on 21 units Tresiba.) An ACE inhibitor/angiotensin II receptor blocker is being taken. She does not see a podiatrist.   Past Medical History:  Diagnosis Date   COVID-19 virus infection 10/13/2018   Diabetes mellitus without complication ([place])    Hypercholesteremia    Sleep apnea    Newly diagnosed in September 2021     Family History  Problem Relation Age of Onset   Hypertension Mother    Diabetes Mother    Stroke Mother    Diabetes Father    Hypertension Father    Stroke Father    Hypertension Son    Diabetes Son    Breast cancer Neg Hx      Current Outpatient Medications:    atorvastatin (LIPITOR) 20 MG tablet, TAKE ONE TABLET BY MOUTH EVERY EVENING WITH A MEAL, Disp: 90 tablet, Rfl: 1   blood glucose meter kit and supplies KIT, Dispense based on patient and insurance preference. Use 3 times daily as directed to check blood sugar. Dx code e11.65, Disp: 1 each, Rfl: 9   Blood Glucose Monitoring Suppl (ONE TOUCH ULTRA MINI) w/Device KIT, 1 each by Does not apply route 4 (four) times daily -  before meals and at bedtime., Disp: 1 kit, Rfl: 0   Cholecalciferol (VITAMIN D-3 PO), Take 1 capsule by mouth daily. 2000 units, Disp: , Rfl:    Continuous Blood Gluc Receiver (FREESTYLE LIBRE 14 DAY READER)  DEVI, USE TO CHECK BLOOD SUGARS FOUR TIMES A DAY DX:E11.65, Disp: 1 each, Rfl: 1   Continuous Blood Gluc Sensor (FREESTYLE LIBRE 2 SENSOR) MISC, Check glucose as directed. CHANGE sensor every 14 DAYS, Disp: 2 each, Rfl: 1   glucagon (GLUCAGEN HYPOKIT) 1 MG SOLR injection, Inject 1 mg into the vein once as needed for up to 1 dose for low blood sugar., Disp: 1 each, Rfl: 5   glucose blood test strip, Use as instructed, Disp: 100 each, Rfl: 5   glucose blood test strip, Use as instructed to check blood sugar 3 times a day. Dx code e11.65, Disp:  100 each, Rfl: 12   Lancets (ONETOUCH ULTRASOFT) lancets, Use as instructed to check blood sugars 4 times daily E11.9, Disp: 300 each, Rfl: 3   lisinopril (ZESTRIL) 10 MG tablet, Take 1 tablet (10 mg total) by mouth daily., Disp: 90 tablet, Rfl: 0   metFORMIN (GLUCOPHAGE XR) 750 MG 24 hr tablet, Take 1 tablet (750 mg total) by mouth daily with breakfast., Disp: 90 tablet, Rfl: 0   Semaglutide,0.25 or 0.5MG/DOS, (OZEMPIC, 0.25 OR 0.5 MG/DOSE,) 2 MG/1.5ML SOPN, Inject 0.5 mg into the skin once a week., Disp: 4.5 mL, Rfl: 1   TRESIBA FLEXTOUCH 100 UNIT/ML FlexTouch Pen, INJECT 21 UNITS into THE SKIN DAILY, Disp: 15 mL, Rfl: 1   Zoster Vaccine Adjuvanted (SHINGRIX) injection, Inject 0.5 mLs into the muscle once for 1 dose., Disp: 0.5 mL, Rfl: 1   No Known Allergies   Review of Systems  Constitutional: Negative.  Negative for fatigue.  Respiratory: Negative.    Cardiovascular: Negative.   Endocrine: Negative for polydipsia, polyphagia and polyuria.  Neurological: Negative.  Negative for dizziness.  Psychiatric/Behavioral: Negative.  Negative for confusion. The patient is not nervous/anxious.     Today's Vitals   02/22/21 0838  BP: 122/70  Pulse: 85  Temp: 98.1 F (36.7 C)  Weight: 149 lb 12.8 oz (67.9 kg)  Height: _0  (1.6 m)   Body mass index is 26.54 kg/m.  Wt Readings from Last 3 Encounters:  02/22/21 149 lb 12.8 oz (67.9 kg)  01/18/21 151 lb 9.6 oz (68.8 kg)  12/15/20 155 lb 3.2 oz (70.4 kg)    Objective:  Physical Exam Vitals reviewed.  Constitutional:      General: She is not in acute distress.    Appearance: Normal appearance. She is well-developed.  Cardiovascular:     Rate and Rhythm: Normal rate and regular rhythm.     Pulses: Normal pulses.     Heart sounds: Normal heart sounds. No murmur heard. Pulmonary:     Effort: Pulmonary effort is normal. No respiratory distress.     Breath sounds: Normal breath sounds. No wheezing.  Chest:     Chest wall: No tenderness.   Skin:    General: Skin is warm and dry.     Capillary Refill: Capillary refill takes less than 2 seconds.  Neurological:     General: No focal deficit present.     Mental Status: She is alert and oriented to person, place, and time.     Cranial Nerves: No cranial nerve deficit.     Motor: No weakness.  Psychiatric:        Mood and Affect: Mood normal.        Behavior: Behavior normal.        Thought Content: Thought content normal.        Judgment: Judgment normal.  Assessment And Plan:     1. Type 2 diabetes mellitus without complication, with long-term current use of insulin (HCC) Comments: Blood sugars have been well controlled, decrease tresiba to 18 units nightly blood sugars have been as low as 68. Glucagon pen sent - Hemoglobin A1c - BMP8+eGFR - glucagon (GLUCAGEN HYPOKIT) 1 MG SOLR injection; Inject 1 mg into the vein once as needed for up to 1 dose for low blood sugar.  Dispense: 1 each; Refill: 5  2. Aortic atherosclerosis ([place]) Comments: Tolerating statin well  3. Encounter for immunization - Zoster Vaccine Adjuvanted [facility]) injection; Inject 0.5 mLs into the muscle once for 1 dose.  Dispense: 0.5 mL; Refill: 1  4. Influenza vaccination declined  Patient declined influenza vaccination at this time. Patient is aware that influenza vaccine prevents illness in 70% of healthy people, and reduces hospitalizations to 30-70% in elderly. This vaccine is recommended annually. Pt is willing to accept risk associated with refusing vaccination.   Patient was given opportunity to ask questions. Patient verbalized understanding of the plan and was able to repeat key elements of the plan. All questions were answered to their satisfaction.  Shannon Brine, FNP   I, Shannon Brine, FNP, have reviewed all documentation for this visit. The documentation on 02/22/21 for the exam, diagnosis, procedures, and orders are all accurate and complete.   IF YOU HAVE BEEN REFERRED TO A  SPECIALIST, IT MAY TAKE 1-2 WEEKS TO SCHEDULE/PROCESS THE REFERRAL. IF YOU HAVE NOT HEARD FROM US/SPECIALIST IN TWO WEEKS, PLEASE GIVE [place] A CALL AT [phone] X [phone].   THE PATIENT IS ENCOURAGED TO PRACTICE SOCIAL DISTANCING DUE TO THE COVID-19 PANDEMIC.

## 2021-02-23 LAB — BMP8+EGFR
BUN/Creatinine Ratio: 39 — ABNORMAL HIGH (ref 12–28)
BUN: 22 mg/dL (ref 8–27)
CO2: 24 mmol/L (ref 20–29)
Calcium: 9.6 mg/dL (ref 8.7–10.3)
Chloride: 103 mmol/L (ref 96–106)
Creatinine, Ser: 0.57 mg/dL (ref 0.57–1.00)
Glucose: 82 mg/dL (ref 65–99)
Potassium: 4.3 mmol/L (ref 3.5–5.2)
Sodium: 141 mmol/L (ref 134–144)
eGFR: 100 mL/min/{1.73_m2} (ref >59)

## 2021-02-23 LAB — HEMOGLOBIN A1C
Est. average glucose Bld gHb Est-mCnc: 140 mg/dL
Hgb A1c MFr Bld: 6.5 % — ABNORMAL HIGH (ref 4.8–5.6)

## 2021-03-06 ENCOUNTER — Other Ambulatory Visit: Payer: Self-pay | Admitting: Nurse Practitioner

## 2021-03-06 DIAGNOSIS — Z794 Long term (current) use of insulin: Secondary | ICD-10-CM

## 2021-03-06 DIAGNOSIS — E119 Type 2 diabetes mellitus without complications: Secondary | ICD-10-CM

## 2021-03-14 ENCOUNTER — Telehealth: Payer: Self-pay

## 2021-03-14 NOTE — Chronic Care Management (AMB) (Signed)
Chronic Care Management Pharmacy Assistant   Name: Shannon [name]  MRN: [id number] DOB: 11-04-1955   Reason for Encounter: Medication Review/ Medication coordination   Recent office visits:  02-22-2021 [name], FNP. BUN/Creatinine= 57. A1C= 6.5. spec grav, UA>=1.030, Leuko= trace. Shingrix given. START glucagon 1 mg as needed for low blood sugar.  Recent consult visits:  None  Hospital visits:  None in previous 6 months  Medications: Outpatient Encounter Medications as of 03/14/2021  Medication Sig   atorvastatin (LIPITOR) 20 MG tablet TAKE ONE TABLET BY MOUTH EVERY EVENING with A meal   blood glucose meter kit and supplies KIT Dispense based on patient and insurance preference. Use 3 times daily as directed to check blood sugar. Dx code e11.65   Blood Glucose Monitoring Suppl (ONE TOUCH ULTRA MINI) w/Device KIT 1 each by Does not apply route 4 (four) times daily -  before meals and at bedtime.   Cholecalciferol (VITAMIN D-3 PO) Take 1 capsule by mouth daily. 2000 units   Continuous Blood Gluc Receiver (FREESTYLE LIBRE 14 DAY READER) DEVI USE TO CHECK BLOOD SUGARS FOUR TIMES A DAY DX:E11.65   Continuous Blood Gluc Sensor (FREESTYLE LIBRE 2 SENSOR) MISC Check glucose as directed. CHANGE sensor FOR FOURTEEN DAYS   glucagon (GLUCAGEN HYPOKIT) 1 MG SOLR injection Inject 1 mg into the vein once as needed for up to 1 dose for low blood sugar.   glucose blood test strip Use as instructed   glucose blood test strip Use as instructed to check blood sugar 3 times a day. Dx code e11.65   Lancets (ONETOUCH ULTRASOFT) lancets Use as instructed to check blood sugars 4 times daily E11.9   lisinopril (ZESTRIL) 10 MG tablet Take 1 tablet (10 mg total) by mouth daily.   metFORMIN (GLUCOPHAGE XR) 750 MG 24 hr tablet Take 1 tablet (750 mg total) by mouth daily with breakfast.   Semaglutide,0.25 or 0.5MG/DOS, (OZEMPIC, 0.25 OR 0.5 MG/DOSE,) 2 MG/1.5ML SOPN Inject 0.5 mg into the skin once a  week.   TRESIBA FLEXTOUCH 100 UNIT/ML FlexTouch Pen INJECT 21 UNITS into THE SKIN DAILY   No facility-administered encounter medications on file as of 03/14/2021.   Reviewed chart for medication changes ahead of medication coordination call.  No OVs, Consults, or hospital visits since last care coordination call/Pharmacist visit. (If appropriate, list visit date, provider name)  No medication changes indicated OR if recent visit, treatment plan here.  BP Readings from Last 3 Encounters:  02/22/21 122/70  01/18/21 118/72  12/15/20 122/70    Lab Results  Component Value Date   HGBA1C 6.5 (H) 02/22/2021     Patient obtains medications through Adherence Packaging  30 Days   Last adherence delivery included: Metformin 750 mg 1 tablet daily (breakfast) Atorvastatin 20 mg 1 tablet daily(bedtime) Lisinopril 10 mg 1 tablet daily (breakfast) Freestyle Libre Kit 2 Sensors- Use as directed every 14 days Vitamin D3 2000 units- 1 capsule daily (evening meal) [employer] Flextouch - Inject 21 units sq daily  Patient declined (meds) last month: Unable to reach patient  Patient is due for next adherence delivery on: 03-22-2021 Called patient and reviewed medications and coordinated delivery.  This delivery to include: Metformin 750 mg 1 tablet daily (breakfast) Atorvastatin 20 mg 1 tablet daily(bedtime) Lisinopril 10 mg 1 tablet daily (breakfast) Freestyle Libre Kit 2 Sensors- Use as directed every 14 days Vitamin D3 2000 units- 1 capsule daily (evening meal) [employer] Flextouch - Inject 21 units sq daily  No short/ acute fill needed  Patient declined the following medications: Tresiba 21 units daily- Patient has 6 pens on hand  Patient needs refills for: Lisinopril 10 mg daily Metformin 750 daily  Confirmed delivery date of 03-22-2021 advised patient that pharmacy will contact them the morning of delivery.   Care Gaps: INFLUENZA VACCINE- Last completed 03/22/2020.  Star Rating  Drugs: Atorvastatin 20 mg- Last filled 02/14/2021 for 30 day supply at [facility] Metformin 750 mg- Last filled 02/14/2021 for 30 day supply at [place] Lisinopril 10 mg- Last filled 02/14/2021 for 30 day supply at [place] Pharmacist Assistant [phone]

## 2021-03-15 ENCOUNTER — Other Ambulatory Visit: Payer: Self-pay

## 2021-03-15 DIAGNOSIS — Z794 Long term (current) use of insulin: Secondary | ICD-10-CM

## 2021-03-15 DIAGNOSIS — E119 Type 2 diabetes mellitus without complications: Secondary | ICD-10-CM

## 2021-03-15 MED ORDER — METFORMIN HCL ER 750 MG PO TB24: 750.0000 mg | ORAL_TABLET | Freq: Every day | ORAL | 0 refills | Status: DC

## 2021-03-15 MED ORDER — LISINOPRIL 10 MG PO TABS
10.0000 mg | ORAL_TABLET | Freq: Every day | ORAL | 0 refills | Status: DC
Start: 2021-03-15 — End: 2021-06-07

## 2021-03-17 DIAGNOSIS — G4733 Obstructive sleep apnea (adult) (pediatric): Secondary | ICD-10-CM | POA: Diagnosis not present

## 2021-04-11 ENCOUNTER — Telehealth: Payer: Self-pay

## 2021-04-11 NOTE — Progress Notes (Signed)
Chronic Care Management Pharmacy Assistant   Name: Shannon [name]  MRN: [id number] DOB: 03-07-1956   Reason for Encounter: Medication Review/ Medication coordination   Recent office visits:  None  Recent consult visits:  None  Hospital visits:  None in previous 6 months  Medications: Outpatient Encounter Medications as of 04/11/2021  Medication Sig   atorvastatin (LIPITOR) 20 MG tablet TAKE ONE TABLET BY MOUTH EVERY EVENING with A meal   blood glucose meter kit and supplies KIT Dispense based on patient and insurance preference. Use 3 times daily as directed to check blood sugar. Dx code e11.65   Blood Glucose Monitoring Suppl (ONE TOUCH ULTRA MINI) w/Device KIT 1 each by Does not apply route 4 (four) times daily -  before meals and at bedtime.   Cholecalciferol (VITAMIN D-3 PO) Take 1 capsule by mouth daily. 2000 units   Continuous Blood Gluc Receiver (FREESTYLE LIBRE 14 DAY READER) DEVI USE TO CHECK BLOOD SUGARS FOUR TIMES A DAY DX:E11.65   Continuous Blood Gluc Sensor (FREESTYLE LIBRE 2 SENSOR) MISC Check glucose as directed. CHANGE sensor FOR FOURTEEN DAYS   glucagon (GLUCAGEN HYPOKIT) 1 MG SOLR injection Inject 1 mg into the vein once as needed for up to 1 dose for low blood sugar.   glucose blood test strip Use as instructed   glucose blood test strip Use as instructed to check blood sugar 3 times a day. Dx code e11.65   Lancets (ONETOUCH ULTRASOFT) lancets Use as instructed to check blood sugars 4 times daily E11.9   lisinopril (ZESTRIL) 10 MG tablet Take 1 tablet (10 mg total) by mouth daily.   metFORMIN (GLUCOPHAGE XR) 750 MG 24 hr tablet Take 1 tablet (750 mg total) by mouth daily with breakfast.   Semaglutide,0.25 or 0.5MG/DOS, (OZEMPIC, 0.25 OR 0.5 MG/DOSE,) 2 MG/1.5ML SOPN Inject 0.5 mg into the skin once a week.   TRESIBA FLEXTOUCH 100 UNIT/ML FlexTouch Pen INJECT 21 UNITS into THE SKIN DAILY   No facility-administered encounter medications on file as of  04/11/2021.   Reviewed chart for medication changes ahead of medication coordination call.  No OVs, Consults, or hospital visits since last care coordination call/Pharmacist visit. (If appropriate, list visit date, provider name)  No medication changes indicated OR if recent visit, treatment plan here.  BP Readings from Last 3 Encounters:  02/22/21 122/70  01/18/21 118/72  12/15/20 122/70    Lab Results  Component Value Date   HGBA1C 6.5 (H) 02/22/2021     Patient obtains medications through Adherence Packaging  30 Days   Last adherence delivery included:  Metformin 750 mg 1 tablet daily (breakfast) Atorvastatin 20 mg 1 tablet daily(bedtime) Lisinopril 10 mg 1 tablet daily (breakfast) Freestyle Libre Kit 2 Sensors- Use as directed every 14 days Vitamin D3 2000 units- 1 capsule daily (evening meal) Tyler Aas Flextouch - Inject 21 units sq daily  Patient declined (meds) last month: Tresiba 21 units daily- Patient has 6 pens on hand   Patient is due for next adherence delivery on: 04-20-2021 Called patient and reviewed medications and coordinated delivery.  This delivery to include: Atorvastatin 20 mg 1 tablet daily (bedtime) Metformin 750 mg 1 tablet daily (breakfast) Lisinopril 10 mg 1 tablet daily (breakfast) Vitamin D3 2000 units- 1 capsule daily (evening meal) Freestyle Libre Kit 2 Sensors- Use as directed every 14 days Tresiba Flextouch - Inject 21 units sq daily   Patient will need a short fill of (med), prior to adherence delivery. (To align  with sync date or if PRN med)  Coordinated acute fill for (med) to be delivered (date).  Patient declined the following medications: Tresiba 21 units daily Patient has 4 pens on hand  Patient needs refills for: [employer]- Use as directed every 14 days  Confirmed delivery date of 04-20-2021 advised patient that pharmacy will contact them the morning of delivery.   Care Gaps: INFLUENZA VACCINE- Last  completed 03/22/2020. Shingrix overdue  Star Rating Drugs: Atorvastatin 20 mg- Last filled 03-16-2021 30 [facility] Metformin 750 mg- Last filled 03-16-2021 [facility] Lisinopril 10 mg- Last filled 03-16-2021 [facility] Ozempic - PAP [place] Clinical Pharmacist Assistant [phone]

## 2021-04-11 NOTE — Chronic Care Management (AMB) (Signed)
04/11/2021- Patient has an increase of Ozempic to 1 mg, Reorder/change request faxed to [employer] Patient assistance program.  Billee Cashing, [facility] Clinical Pharmacist Assistant [phone]

## 2021-04-14 ENCOUNTER — Other Ambulatory Visit: Payer: Self-pay | Admitting: Nurse Practitioner

## 2021-04-16 DIAGNOSIS — G4733 Obstructive sleep apnea (adult) (pediatric): Secondary | ICD-10-CM | POA: Diagnosis not present

## 2021-05-05 ENCOUNTER — Telehealth: Payer: Self-pay

## 2021-05-05 NOTE — Chronic Care Management (AMB) (Signed)
    Chronic Care Management Pharmacy Assistant   Name: Shannon [name]  MRN: [id number] DOB: [birth year]  Reason for Encounter: 2023 PAP   Medications: Outpatient Encounter Medications as of 05/05/2021  Medication Sig   atorvastatin (LIPITOR) 20 MG tablet TAKE ONE TABLET BY MOUTH EVERY EVENING with A meal   blood glucose meter kit and supplies KIT Dispense based on patient and insurance preference. Use 3 times daily as directed to check blood sugar. Dx code e11.65   Blood Glucose Monitoring Suppl (ONE TOUCH ULTRA MINI) w/Device KIT 1 each by Does not apply route 4 (four) times daily -  before meals and at bedtime.   Cholecalciferol (VITAMIN D-3 PO) Take 1 capsule by mouth daily. 2000 units   Continuous Blood Gluc Receiver (FREESTYLE LIBRE 14 DAY READER) DEVI USE TO CHECK BLOOD SUGARS FOUR TIMES A DAY DX:E11.65   Continuous Blood Gluc Sensor (FREESTYLE LIBRE 2 SENSOR) MISC apply every 14 DAYS   glucagon (GLUCAGEN HYPOKIT) 1 MG SOLR injection Inject 1 mg into the vein once as needed for up to 1 dose for low blood sugar.   glucose blood test strip Use as instructed   glucose blood test strip Use as instructed to check blood sugar 3 times a day. Dx code e11.65   Lancets (ONETOUCH ULTRASOFT) lancets Use as instructed to check blood sugars 4 times daily E11.9   lisinopril (ZESTRIL) 10 MG tablet Take 1 tablet (10 mg total) by mouth daily.   metFORMIN (GLUCOPHAGE XR) 750 MG 24 hr tablet Take 1 tablet (750 mg total) by mouth daily with breakfast.   Semaglutide,0.25 or 0.5MG/DOS, (OZEMPIC, 0.25 OR 0.5 MG/DOSE,) 2 MG/1.5ML SOPN Inject 0.5 mg into the skin once a week.   TRESIBA FLEXTOUCH 100 UNIT/ML FlexTouch Pen INJECT 21 UNITS into THE SKIN DAILY   No facility-administered encounter medications on file as of 05/05/2021.   05-05-2021: Initiated 2023 patient assistance application for Ozempic. Left patient a voicemail informing her that application will be mailed to sign and return to office with  income.  [place] Pharmacist Assistant [phone]

## 2021-05-09 ENCOUNTER — Telehealth: Payer: Self-pay

## 2021-05-09 NOTE — Chronic Care Management (AMB) (Signed)
Chronic Care Management Pharmacy Assistant   Name: Shannon [name]  MRN: [id number] DOB: Aug 01, 1955   Reason for Encounter: Medication Review/ Medication coordination  Recent office visits:  None  Recent consult visits:  None  Hospital visits:  None in previous 6 months  Medications: Outpatient Encounter Medications as of 05/09/2021  Medication Sig   atorvastatin (LIPITOR) 20 MG tablet TAKE ONE TABLET BY MOUTH EVERY EVENING with A meal   blood glucose meter kit and supplies KIT Dispense based on patient and insurance preference. Use 3 times daily as directed to check blood sugar. Dx code e11.65   Blood Glucose Monitoring Suppl (ONE TOUCH ULTRA MINI) w/Device KIT 1 each by Does not apply route 4 (four) times daily -  before meals and at bedtime.   Cholecalciferol (VITAMIN D-3 PO) Take 1 capsule by mouth daily. 2000 units   Continuous Blood Gluc Receiver (FREESTYLE LIBRE 14 DAY READER) DEVI USE TO CHECK BLOOD SUGARS FOUR TIMES A DAY DX:E11.65   Continuous Blood Gluc Sensor (FREESTYLE LIBRE 2 SENSOR) MISC apply every 14 DAYS   glucagon (GLUCAGEN HYPOKIT) 1 MG SOLR injection Inject 1 mg into the vein once as needed for up to 1 dose for low blood sugar.   glucose blood test strip Use as instructed   glucose blood test strip Use as instructed to check blood sugar 3 times a day. Dx code e11.65   Lancets (ONETOUCH ULTRASOFT) lancets Use as instructed to check blood sugars 4 times daily E11.9   lisinopril (ZESTRIL) 10 MG tablet Take 1 tablet (10 mg total) by mouth daily.   metFORMIN (GLUCOPHAGE XR) 750 MG 24 hr tablet Take 1 tablet (750 mg total) by mouth daily with breakfast.   Semaglutide,0.25 or 0.5MG/DOS, (OZEMPIC, 0.25 OR 0.5 MG/DOSE,) 2 MG/1.5ML SOPN Inject 0.5 mg into the skin once a week.   TRESIBA FLEXTOUCH 100 UNIT/ML FlexTouch Pen INJECT 21 UNITS into THE SKIN DAILY   No facility-administered encounter medications on file as of 05/09/2021.  Reviewed chart for medication  changes ahead of medication coordination call.  No OVs, Consults, or hospital visits since last care coordination call/Pharmacist visit. (If appropriate, list visit date, provider name)  No medication changes indicated OR if recent visit, treatment plan here.  BP Readings from Last 3 Encounters:  02/22/21 122/70  01/18/21 118/72  12/15/20 122/70    Lab Results  Component Value Date   HGBA1C 6.5 (H) 02/22/2021     Patient obtains medications through Adherence Packaging  30 Days   Last adherence delivery included:  Atorvastatin 20 mg 1 tablet daily (bedtime) Metformin 750 mg 1 tablet daily (breakfast) Lisinopril 10 mg 1 tablet daily (breakfast) Vitamin D3 2000 units- 1 capsule daily (evening meal) Freestyle Libre Kit 2 Sensors- Use as directed every 14 days   Patient declined (meds) last month: Tresiba 21 units daily Patient has 4 pens on hand  Patient is due for next adherence delivery on: 05-22-2021  Called patient and reviewed medications and coordinated delivery.  This delivery to include: Atorvastatin 20 mg 1 tablet daily (bedtime) Metformin 750 mg 1 tablet daily (breakfast) Lisinopril 10 mg 1 tablet daily (breakfast) Vitamin D3 2000 units- 1 capsule daily (evening meal) Freestyle Libre Kit 2 Sensors- Use as directed every 14 days  No short/acute fills needed  Patient needs refills for: None  Confirmed delivery date of 05-22-2021 advised patient that pharmacy will contact them the morning of delivery.   Care Gaps: INFLUENZA VACCINE- Last completed 03/22/2020.  Shingrix overdue PNA Vac overdue Covid booster overdue last completed 01-11-2021  Star Rating Drugs: Atorvastatin 20 mg- Last filled 04-14-2021 30 [facility] Metformin 750 mg- Last filled 04-14-2021 [facility] Lisinopril 10 mg- Last filled 04-14-2021 30 [facility] Ozempic - PAP  [place] Clinical Pharmacist Assistant [phone]

## 2021-05-11 ENCOUNTER — Ambulatory Visit (INDEPENDENT_AMBULATORY_CARE_PROVIDER_SITE_OTHER): Payer: HMO | Admitting: Ophthalmology

## 2021-05-11 ENCOUNTER — Other Ambulatory Visit: Payer: Self-pay

## 2021-05-11 ENCOUNTER — Encounter (INDEPENDENT_AMBULATORY_CARE_PROVIDER_SITE_OTHER): Payer: Self-pay | Admitting: Ophthalmology

## 2021-05-11 DIAGNOSIS — G4730 Sleep apnea, unspecified: Secondary | ICD-10-CM

## 2021-05-11 DIAGNOSIS — H35033 Hypertensive retinopathy, bilateral: Secondary | ICD-10-CM

## 2021-05-11 DIAGNOSIS — E113311 Type 2 diabetes mellitus with moderate nonproliferative diabetic retinopathy with macular edema, right eye: Secondary | ICD-10-CM

## 2021-05-11 DIAGNOSIS — H35071 Retinal telangiectasis, right eye: Secondary | ICD-10-CM | POA: Diagnosis not present

## 2021-05-11 DIAGNOSIS — E113491 Type 2 diabetes mellitus with severe nonproliferative diabetic retinopathy without macular edema, right eye: Secondary | ICD-10-CM

## 2021-05-11 DIAGNOSIS — E113492 Type 2 diabetes mellitus with severe nonproliferative diabetic retinopathy without macular edema, left eye: Secondary | ICD-10-CM

## 2021-05-11 MED ORDER — FLUORESCEIN SODIUM 10 % IV SOLN
500.0000 mg | INTRAVENOUS | Status: AC | PRN
  Administered 2021-05-11: 09:00:00 500 mg via INTRAVENOUS

## 2021-05-11 NOTE — Progress Notes (Signed)
05/11/2021     CHIEF COMPLAINT Patient presents for  Chief Complaint  Patient presents with   Retina Follow Up      HISTORY OF PRESENT ILLNESS: Shannon [name] is a 65 y.o. female who presents to the clinic today for:   HPI     Retina Follow Up   Patient presents with  Diabetic Retinopathy.  In right eye.  This started 3 months ago.  Severity is mild.  Duration of 3 months.  Since onset it is stable.        Comments   3 mos fu OU oct FP/ FFA R/L. actinic keratosis       Last edited by Laurin Coder on 05/11/2021  8:22 AM.      Referring physician: [name], [place] [address] [place],  [place] [address]  HISTORICAL INFORMATION:   Selected notes from the MEDICAL RECORD NUMBER    Lab Results  Component Value Date   HGBA1C 6.5 (H) 02/22/2021     CURRENT MEDICATIONS: No current outpatient medications on file. (Ophthalmic Drugs)   No current facility-administered medications for this visit. (Ophthalmic Drugs)   Current Outpatient Medications (Other)  Medication Sig   atorvastatin (LIPITOR) 20 MG tablet TAKE ONE TABLET BY MOUTH EVERY EVENING with A meal   blood glucose meter kit and supplies KIT Dispense based on patient and insurance preference. Use 3 times daily as directed to check blood sugar. Dx code e11.65   Blood Glucose Monitoring Suppl (ONE TOUCH ULTRA MINI) w/Device KIT 1 each by Does not apply route 4 (four) times daily -  before meals and at bedtime.   Cholecalciferol (VITAMIN D-3 PO) Take 1 capsule by mouth daily. 2000 units   Continuous Blood Gluc Receiver (FREESTYLE LIBRE 14 DAY READER) DEVI USE TO CHECK BLOOD SUGARS FOUR TIMES A DAY DX:E11.65   Continuous Blood Gluc Sensor (FREESTYLE LIBRE 2 SENSOR) MISC apply every 14 DAYS   glucagon (GLUCAGEN HYPOKIT) 1 MG SOLR injection Inject 1 mg into the vein once as needed for up to 1 dose for low blood sugar.   glucose blood test strip Use as instructed   glucose blood test strip  Use as instructed to check blood sugar 3 times a day. Dx code e11.65   Lancets (ONETOUCH ULTRASOFT) lancets Use as instructed to check blood sugars 4 times daily E11.9   lisinopril (ZESTRIL) 10 MG tablet Take 1 tablet (10 mg total) by mouth daily.   metFORMIN (GLUCOPHAGE XR) 750 MG 24 hr tablet Take 1 tablet (750 mg total) by mouth daily with breakfast.   Semaglutide,0.25 or 0.5MG/DOS, (OZEMPIC, 0.25 OR 0.5 MG/DOSE,) 2 MG/1.5ML SOPN Inject 0.5 mg into the skin once a week.   TRESIBA FLEXTOUCH 100 UNIT/ML FlexTouch Pen INJECT 21 UNITS into THE SKIN DAILY   No current facility-administered medications for this visit. (Other)      REVIEW OF SYSTEMS:    ALLERGIES No Known Allergies  PAST MEDICAL HISTORY Past Medical History:  Diagnosis Date   COVID-19 virus infection 10/13/2018   Diabetes mellitus without complication ([place])    Hypercholesteremia    Sleep apnea    Newly diagnosed in September 2021   Past Surgical History:  Procedure Laterality Date   ABDOMINAL HYSTERECTOMY     CATARACT EXTRACTION Bilateral    CESAREAN SECTION     MYOMECTOMY      FAMILY HISTORY Family History  Problem Relation Age of Onset   Hypertension Mother  Diabetes Mother    Stroke Mother    Diabetes Father    Hypertension Father    Stroke Father    Hypertension Son    Diabetes Son    Breast cancer Neg Hx     SOCIAL HISTORY Social History   Tobacco Use   Smoking status: Former    Types: Cigarettes    Quit date: 2006    Years since quitting: 16.8   Smokeless tobacco: Never   Tobacco comments:    quit 10 yrs  Vaping Use   Vaping Use: Never used  Substance Use Topics   Alcohol use: Yes    Comment: 1 glass of wine sometimes daily    Drug use: Never         OPHTHALMIC EXAM:  Base Eye Exam     Visual Acuity (ETDRS)       Right Left   Dist  20/20 20/25         Tonometry (Tonopen, 8:21 AM)       Right Left   Pressure 12 9  Squeezing eyes closed        Pupils        Pupils Dark Light APD   Right PERRL 3 2 None   Left PERRL 3 2 None         Extraocular Movement       Right Left    Full Full         Neuro/Psych     Oriented x3: Yes   Mood/Affect: Normal         Dilation     Both eyes: 1.0% Mydriacyl, 2.5% Phenylephrine @ 8:21 AM           Slit Lamp and Fundus Exam     External Exam       Right Left   External Normal Normal         Slit Lamp Exam       Right Left   Lids/Lashes Normal Normal   Conjunctiva/Sclera White and quiet White and quiet   Cornea Clear Clear   Anterior Chamber Deep and quiet Deep and quiet   Iris Round and reactive Round and reactive   Lens Posterior chamber intraocular lens Posterior chamber intraocular lens   Anterior Vitreous Normal Normal         Fundus Exam       Right Left   Posterior Vitreous Normal Normal, small vitreous debris, no cells, no overt PVD   Disc Normal Normal, posterior pole cotton-wool spots noted   C/D Ratio 0.2 0.2   Macula  Exudates, Macular thickening, Mild clinically significant macular edema Microaneurysms, no detectable clinically significant macular edema   Vessels NPDR-Severe NPDR-Severe   Periphery Normal Normal            IMAGING AND PROCEDURES  Imaging and Procedures for 05/11/21  OCT, Retina - OU - Both Eyes       Right Eye Quality was good. Scan locations included subfoveal. Central Foveal Thickness: 284. Progression has improved. Findings include abnormal foveal contour.   Left Eye Quality was good. Scan locations included subfoveal. Central Foveal Thickness: 330. Progression has been stable. Findings include normal foveal contour.   Notes CSME OD, now some 3 months status post most recent injection Avastin concomitantly patient is using CPAP effectively and compliantly thus less nightly macular hypoxia      Color Fundus Photography Optos - OU - Both Eyes       Right Eye Progression  has been stable. Disc findings include  normal observations. Macula : microaneurysms. Vessels : IRMA.   Left Eye Progression has been stable. Disc findings include normal observations. Macula : microaneurysms. Vessels : IRMA.   Notes OD with posterior pole cotton-wool spots suggestive of superimposed hypertensive retinopathy.  I discussed these findings with the patient have encouraged her to monitor her waking hours for hypertension.     Fluorescein Angiography Optos (Transit OD)       Injection: 500 mg Fluorescein Sodium 10 %   Route: Intravenous   NDC: [phone]   Right Eye   Progression has no prior data. Early phase findings include microaneurysm, leakage. Mid/Late phase findings include leakage, microaneurysm, vascular perfusion defect.   Left Eye   Progression has no prior data. Mid/Late phase findings include vascular perfusion defect.   Notes Bilateral peripheral vascular wall staining in the retinal temporal peripherally, will monitor this area for capillary dropout .  No large regional vascular perfusion defects however  Angiographic CSME is present yet not confirmed by clinical examination or OCT.  We will continue to monitor             ASSESSMENT/PLAN:  Sleep apnea in adult Patient very compliant with CPAP use,  Severe nonproliferative diabetic retinopathy of left eye ([place]) Clinically confirmed by fluorescein angiography today  Type 2 macular telangiectasis of right eye Concomitant changes OD improved overall now over the last 14 months since onset of CPAP for diagnosed sleep apnea     ICD-10-CM   1. Moderate nonproliferative diabetic retinopathy of right eye with macular edema associated with type 2 diabetes mellitus (HCC)  E11.3311 OCT, Retina - OU - Both Eyes    Color Fundus Photography Optos - OU - Both Eyes    Fluorescein Angiography Optos (Transit OD)    Fluorescein Sodium 10 % injection 500 mg    2. Sleep apnea in adult  G47.30     3. Severe nonproliferative diabetic  retinopathy of right eye without macular edema associated with type 2 diabetes mellitus ([place])  E11.3491     4. Severe nonproliferative diabetic retinopathy of left eye associated with type 2 diabetes mellitus, macular edema presence unspecified ([place])  Y61.6837     5. Type 2 macular telangiectasis of right eye  H35.071       1.  No active CSME OU at this point we will continue to monitor at every 5-monthinterval however because severe NPDR is present with temporal watershed regions of the retina with vascular wall staining suggestive of an imminent or possible capillary occlusion in the near future.  Large regional perfusion defect sometimes benefit by regional or local PRP for long-term vegF suppression  2.  3.  Ophthalmic Meds Ordered this visit:  Meds ordered this encounter  Medications   Fluorescein Sodium 10 % injection 500 mg       Return in about 4 months (around 09/08/2021) for DILATE OU, OCT.  There are no Patient Instructions on file for this visit.   Explained the diagnoses, plan, and follow up with the patient and they expressed understanding.  Patient expressed understanding of the importance of proper follow up care.   GClent DemarkRankin M.D. Diseases & Surgery of the Retina and Vitreous Retina & Diabetic EMilo11/17/22     Abbreviations: M myopia (nearsighted); A astigmatism; H hyperopia (farsighted); P presbyopia; Mrx spectacle prescription;  CTL contact lenses; OD right eye; OS left eye; OU both eyes  XT exotropia; ET esotropia; PEK  punctate epithelial keratitis; PEE punctate epithelial erosions; DES dry eye syndrome; MGD meibomian gland dysfunction; ATs artificial tears; PFAT's preservative free artificial tears; [place] nuclear sclerotic cataract; PSC posterior subcapsular cataract; ERM epi-retinal membrane; PVD posterior vitreous detachment; RD retinal detachment; DM diabetes mellitus; DR diabetic retinopathy; NPDR non-proliferative diabetic retinopathy; PDR  proliferative diabetic retinopathy; CSME clinically significant macular edema; DME diabetic macular edema; dbh dot blot hemorrhages; CWS cotton wool spot; POAG primary open angle glaucoma; C/D cup-to-disc ratio; HVF humphrey visual field; GVF goldmann visual field; OCT optical coherence tomography; IOP intraocular pressure; BRVO Branch retinal vein occlusion; CRVO central retinal vein occlusion; CRAO central retinal artery occlusion; BRAO branch retinal artery occlusion; RT retinal tear; SB scleral buckle; PPV pars plana vitrectomy; VH Vitreous hemorrhage; PRP panretinal laser photocoagulation; IVK intravitreal kenalog; VMT vitreomacular traction; MH Macular hole;  NVD neovascularization of the disc; NVE neovascularization elsewhere; AREDS age related eye disease study; ARMD age related macular degeneration; POAG primary open angle glaucoma; EBMD epithelial/anterior basement membrane dystrophy; ACIOL anterior chamber intraocular lens; IOL intraocular lens; PCIOL posterior chamber intraocular lens; Phaco/IOL phacoemulsification with intraocular lens placement; [place] photorefractive keratectomy; LASIK laser assisted in situ keratomileusis; HTN hypertension; DM diabetes mellitus; COPD chronic obstructive pulmonary disease

## 2021-05-11 NOTE — Assessment & Plan Note (Signed)
Patient very compliant with CPAP use,

## 2021-05-11 NOTE — Assessment & Plan Note (Signed)
Similar cotton-wool spots noted posterior pole.  Currently sleep apnea is treated.  Thus patient may still have episodic hypertension for which home monitoring would be appropriate

## 2021-05-11 NOTE — Assessment & Plan Note (Signed)
Concomitant changes OD improved overall now over the last 14 months since onset of CPAP for diagnosed sleep apnea

## 2021-05-11 NOTE — Assessment & Plan Note (Signed)
Clinically confirmed by fluorescein angiography today

## 2021-05-17 ENCOUNTER — Ambulatory Visit: Payer: Self-pay

## 2021-05-17 DIAGNOSIS — G4733 Obstructive sleep apnea (adult) (pediatric): Secondary | ICD-10-CM | POA: Diagnosis not present

## 2021-05-17 NOTE — Progress Notes (Signed)
  Clinical Pharmacist Note  Follow Up Note   05/17/2021 Name: Jazzmine Kleiman MRN: [id number] DOB: Nov 13, 1955   Referred by: Arnette Felts, FNP Reason for referral : No chief complaint on file.   Reviewed patients chart and confirmed patients medications. No changes at this time  to patients chart.   Follow Up Plan: Telephone follow up appointment with care management team member scheduled for: 06/29/2021.  Cherylin Mylar, CPP, PharmD Clinical Pharmacist Practitioner [facility] [phone]

## 2021-05-24 ENCOUNTER — Other Ambulatory Visit: Payer: Self-pay

## 2021-05-24 MED ORDER — FREESTYLE TEST VI STRP: ORAL_STRIP | 12 refills | Status: DC

## 2021-05-24 MED ORDER — FREESTYLE TEST VI STRP
ORAL_STRIP | 12 refills | Status: DC
Start: 2021-05-24 — End: 2022-07-18

## 2021-05-24 NOTE — Addendum Note (Signed)
Addended by: Harlan Stains on: 05/24/2021 04:39 PM   Modules accepted: Orders

## 2021-06-07 ENCOUNTER — Telehealth: Payer: Self-pay

## 2021-06-07 ENCOUNTER — Other Ambulatory Visit: Payer: Self-pay

## 2021-06-07 DIAGNOSIS — E119 Type 2 diabetes mellitus without complications: Secondary | ICD-10-CM

## 2021-06-07 DIAGNOSIS — Z794 Long term (current) use of insulin: Secondary | ICD-10-CM

## 2021-06-07 MED ORDER — METFORMIN HCL ER 750 MG PO TB24: 750.0000 mg | ORAL_TABLET | Freq: Every day | ORAL | 0 refills | Status: DC

## 2021-06-07 MED ORDER — LISINOPRIL 10 MG PO TABS: 10.0000 mg | ORAL_TABLET | Freq: Every day | ORAL | 0 refills | Status: DC

## 2021-06-07 MED ORDER — FREESTYLE LIBRE 2 SENSOR MISC: 1 refills | Status: DC

## 2021-06-07 NOTE — Chronic Care Management (AMB) (Signed)
Chronic Care Management Pharmacy Assistant   Name: Chonte Ricke  MRN: [id number] DOB: 01/28/1956   Reason for Encounter: Medication Review/ Medication coordination  Recent office visits:  None  Recent consult visits:  None  Hospital visits:  None in previous 6 months  Medications: Outpatient Encounter Medications as of 06/07/2021  Medication Sig   atorvastatin (LIPITOR) 20 MG tablet TAKE ONE TABLET BY MOUTH EVERY EVENING with A meal   blood glucose meter kit and supplies KIT Dispense based on patient and insurance preference. Use 3 times daily as directed to check blood sugar. Dx code e11.65   Blood Glucose Monitoring Suppl (ONE TOUCH ULTRA MINI) w/Device KIT 1 each by Does not apply route 4 (four) times daily -  before meals and at bedtime.   Cholecalciferol (VITAMIN D-3 PO) Take 1 capsule by mouth daily. 2000 units   Continuous Blood Gluc Receiver (FREESTYLE LIBRE 14 DAY READER) DEVI USE TO CHECK BLOOD SUGARS FOUR TIMES A DAY DX:E11.65   Continuous Blood Gluc Sensor (FREESTYLE LIBRE 2 SENSOR) MISC apply every 14 DAYS   glucagon (GLUCAGEN HYPOKIT) 1 MG SOLR injection Inject 1 mg into the vein once as needed for up to 1 dose for low blood sugar.   glucose blood (FREESTYLE TEST STRIPS) test strip Use as instructed   glucose blood (FREESTYLE TEST STRIPS) test strip Use as instructed   glucose blood test strip Use as instructed   glucose blood test strip Use as instructed to check blood sugar 3 times a day. Dx code e11.65   Lancets (ONETOUCH ULTRASOFT) lancets Use as instructed to check blood sugars 4 times daily E11.9   lisinopril (ZESTRIL) 10 MG tablet Take 1 tablet (10 mg total) by mouth daily.   metFORMIN (GLUCOPHAGE XR) 750 MG 24 hr tablet Take 1 tablet (750 mg total) by mouth daily with breakfast.   Semaglutide,0.25 or 0.5MG/DOS, (OZEMPIC, 0.25 OR 0.5 MG/DOSE,) 2 MG/1.5ML SOPN Inject 0.5 mg into the skin once a week.   TRESIBA FLEXTOUCH 100 UNIT/ML FlexTouch Pen INJECT  21 UNITS into THE SKIN DAILY   No facility-administered encounter medications on file as of 06/07/2021.  Reviewed chart for medication changes ahead of medication coordination call.  No OVs, Consults, or hospital visits since last care coordination call/Pharmacist visit. (If appropriate, list visit date, provider name)  No medication changes indicated OR if recent visit, treatment plan here.  BP Readings from Last 3 Encounters:  02/22/21 122/70  01/18/21 118/72  12/15/20 122/70    Lab Results  Component Value Date   HGBA1C 6.5 (H) 02/22/2021     Patient obtains medications through Adherence Packaging  30 Days   Last adherence delivery included:  Atorvastatin 20 mg 1 tablet daily (bedtime) Metformin 750 mg 1 tablet daily (breakfast) Lisinopril 10 mg 1 tablet daily (breakfast) Vitamin D3 2000 units- 1 capsule daily (evening meal) Freestyle Libre Kit 2 Sensors- Use as directed every 14 days  Patient declined (meds) last month: None  Patient is due for next adherence delivery on: 06-20-2021  Called patient and reviewed medications and coordinated delivery.  This delivery to include: Atorvastatin 20 mg 1 tablet daily (bedtime) Metformin 750 mg 1 tablet daily (breakfast) Lisinopril 10 mg 1 tablet daily (breakfast) Vitamin D3 2000 units- 1 capsule daily (evening meal) Freestyle Libre Kit 2 Sensors- Use as directed every 14 days  No short/acute fill needed  Patient declined the following medications: None  Patient needs refills for: Metformin Lisinopril Freestyle libre  Confirmed  delivery date of 06-20-2021 advised patient that pharmacy will contact them the morning of delivery.   Care Gaps: INFLUENZA VACCINE- Last completed 03/22/2020. Shingrix overdue PNA Vac overdue Covid booster overdue last completed 01-11-2021 AWV 11-30-2021  Star Rating Drugs: Atorvastatin 20 mg- Last filled 05-15-2021 30 DS [facility] Metformin 750 mg- Last filled 05-15-2021 30 DS  [facility] Lisinopril 10 mg- Last filled 05-15-2021 30 DS [facility] Ozempic - Patient assistance  [place] Pharmacist Assistant [phone]

## 2021-06-16 DIAGNOSIS — G4733 Obstructive sleep apnea (adult) (pediatric): Secondary | ICD-10-CM | POA: Diagnosis not present

## 2021-06-21 ENCOUNTER — Telehealth: Payer: Self-pay

## 2021-06-21 NOTE — Chronic Care Management (AMB) (Signed)
° ° °  Chronic Care Management Pharmacy Assistant   Name: Haniyah Maciolek  MRN: [id number] DOB: 04-Sep-1955  Reason for Encounter: Reschedule appointment    Medications: Outpatient Encounter Medications as of 06/21/2021  Medication Sig   atorvastatin (LIPITOR) 20 MG tablet TAKE ONE TABLET BY MOUTH EVERY EVENING with A meal   blood glucose meter kit and supplies KIT Dispense based on patient and insurance preference. Use 3 times daily as directed to check blood sugar. Dx code e11.65   Blood Glucose Monitoring Suppl (ONE TOUCH ULTRA MINI) w/Device KIT 1 each by Does not apply route 4 (four) times daily -  before meals and at bedtime.   Cholecalciferol (VITAMIN D-3 PO) Take 1 capsule by mouth daily. 2000 units   Continuous Blood Gluc Receiver (FREESTYLE LIBRE 14 DAY READER) DEVI USE TO CHECK BLOOD SUGARS FOUR TIMES A DAY DX:E11.65   Continuous Blood Gluc Sensor (FREESTYLE LIBRE 2 SENSOR) MISC apply every 14 DAYS   glucagon (GLUCAGEN HYPOKIT) 1 MG SOLR injection Inject 1 mg into the vein once as needed for up to 1 dose for low blood sugar.   glucose blood (FREESTYLE TEST STRIPS) test strip Use as instructed   glucose blood (FREESTYLE TEST STRIPS) test strip Use as instructed   glucose blood test strip Use as instructed   glucose blood test strip Use as instructed to check blood sugar 3 times a day. Dx code e11.65   Lancets (ONETOUCH ULTRASOFT) lancets Use as instructed to check blood sugars 4 times daily E11.9   lisinopril (ZESTRIL) 10 MG tablet Take 1 tablet (10 mg total) by mouth daily.   metFORMIN (GLUCOPHAGE XR) 750 MG 24 hr tablet Take 1 tablet (750 mg total) by mouth daily with breakfast.   Semaglutide,0.25 or 0.5MG /DOS, (OZEMPIC, 0.25 OR 0.5 MG/DOSE,) 2 MG/1.5ML SOPN Inject 0.5 mg into the skin once a week.   TRESIBA FLEXTOUCH 100 UNIT/ML FlexTouch Pen INJECT 21 UNITS into THE SKIN DAILY   No facility-administered encounter medications on file as of 06/21/2021.   06-21-2021: Called  patient to reschedule telephone visit with [name] CPP on 06-29-2021. 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  [place] Pharmacist Assistant [phone]

## 2021-06-22 ENCOUNTER — Encounter: Payer: Self-pay | Admitting: Nurse Practitioner

## 2021-06-22 ENCOUNTER — Ambulatory Visit (INDEPENDENT_AMBULATORY_CARE_PROVIDER_SITE_OTHER): Payer: HMO | Admitting: Nurse Practitioner

## 2021-06-22 ENCOUNTER — Other Ambulatory Visit: Payer: Self-pay

## 2021-06-22 VITALS — BP 120/68 | HR 69 | Temp 98.2°F | Ht 62.2 in | Wt 150.4 lb

## 2021-06-22 DIAGNOSIS — E782 Mixed hyperlipidemia: Secondary | ICD-10-CM | POA: Diagnosis not present

## 2021-06-22 DIAGNOSIS — E119 Type 2 diabetes mellitus without complications: Secondary | ICD-10-CM | POA: Diagnosis not present

## 2021-06-22 DIAGNOSIS — Z23 Encounter for immunization: Secondary | ICD-10-CM | POA: Diagnosis not present

## 2021-06-22 DIAGNOSIS — Z794 Long term (current) use of insulin: Secondary | ICD-10-CM | POA: Diagnosis not present

## 2021-06-22 DIAGNOSIS — Z6827 Body mass index (BMI) 27.0-27.9, adult: Secondary | ICD-10-CM | POA: Diagnosis not present

## 2021-06-22 DIAGNOSIS — E113493 Type 2 diabetes mellitus with severe nonproliferative diabetic retinopathy without macular edema, bilateral: Secondary | ICD-10-CM | POA: Diagnosis not present

## 2021-06-22 DIAGNOSIS — I10 Essential (primary) hypertension: Secondary | ICD-10-CM | POA: Diagnosis not present

## 2021-06-22 MED ORDER — TRESIBA FLEXTOUCH 100 UNIT/ML ~~LOC~~ SOPN
15.0000 [IU] | PEN_INJECTOR | Freq: Every day | SUBCUTANEOUS | 1 refills | Status: DC
Start: 2021-06-22 — End: 2022-02-22

## 2021-06-22 MED ORDER — SHINGRIX 50 MCG/0.5ML IM SUSR: 0.5000 mL | Freq: Once | INTRAMUSCULAR | 0 refills | Status: AC

## 2021-06-22 MED ORDER — PNEUMOCOCCAL 13-VAL CONJ VACC IM SUSP: 0.5000 mL | INTRAMUSCULAR | 0 refills | Status: AC

## 2021-06-22 MED ORDER — PNEUMOCOCCAL 13-VAL CONJ VACC IM SUSP: 0.5000 mL | INTRAMUSCULAR | 0 refills | Status: DC

## 2021-06-22 NOTE — Progress Notes (Signed)
I,Yamilka J Llittleton,acting as a Education administrator for [employer], FNP.,have documented all relevant documentation on the behalf of Shannon Brine, FNP,as directed by  Shannon Brine, FNP while in the presence of Shannon [name], [place].   This visit occurred during the SARS-CoV-2 public health emergency.  Safety protocols were in place, including screening questions prior to the visit, additional usage of staff PPE, and extensive cleaning of exam room while observing appropriate contact time as indicated for disinfecting solutions.  Subjective:     Patient ID: Shannon [name] , female    DOB: [birth year] , 65 y.o.   MRN: [id number]   Chief Complaint  Patient presents with   Hypertension   Diabetes    HPI  Pt is here today for BP & DM f/u. She is taking 18 units of tresiba.   Wt Readings from Last 3 Encounters: 06/22/21 : 150 lb 6.4 oz (68.2 kg) 02/22/21 : 149 lb 12.8 oz (67.9 kg) 01/18/21 : 151 lb 9.6 oz (68.8 kg)    Diabetes She presents for her follow-up diabetic visit. She has type 2 diabetes mellitus. Her disease course has been stable. Pertinent negatives for hypoglycemia include no confusion, dizziness or nervousness/anxiousness. Pertinent negatives for diabetes include no fatigue, no polydipsia, no polyphagia and no polyuria. There are no hypoglycemic complications. Symptoms are stable. There are no diabetic complications. Risk factors for coronary artery disease include diabetes mellitus, hypertension and obesity. Current diabetic treatment includes oral agent (dual therapy). She is compliant with treatment all of the time (she has only been taking 0.25 mg weekly, trying to spread out her medication when she did not have insurance). She is following a generally healthy diet. When asked about meal planning, she reported none. She has not had a previous visit with a dietitian. She participates in exercise every other day. Her home blood glucose trend is increasing steadily. Her overall blood  glucose range is 140-180 mg/dl. (She wears the [place].   Blood sugar had one high of 175, she has been having several low readings 61-69. ) An ACE inhibitor/angiotensin II receptor blocker is being taken. She does not see a podiatrist.   Past Medical History:  Diagnosis Date   COVID-19 virus infection 10/13/2018   Diabetes mellitus without complication ([place])    Hypercholesteremia    Sleep apnea    Newly diagnosed in September 2021     Family History  Problem Relation Age of Onset   Hypertension Mother    Diabetes Mother    Stroke Mother    Diabetes Father    Hypertension Father    Stroke Father    Hypertension Son    Diabetes Son    Breast cancer Neg Hx      Current Outpatient Medications:    atorvastatin (LIPITOR) 20 MG tablet, TAKE ONE TABLET BY MOUTH EVERY EVENING with A meal, Disp: 90 tablet, Rfl: 1   blood glucose meter kit and supplies KIT, Dispense based on patient and insurance preference. Use 3 times daily as directed to check blood sugar. Dx code e11.65, Disp: 1 each, Rfl: 9   Blood Glucose Monitoring Suppl (ONE TOUCH ULTRA MINI) w/Device KIT, 1 each by Does not apply route 4 (four) times daily -  before meals and at bedtime., Disp: 1 kit, Rfl: 0   Cholecalciferol (VITAMIN D-3 PO), Take 1 capsule by mouth daily. 2000 units, Disp: , Rfl:    Continuous Blood Gluc Receiver (FREESTYLE LIBRE 14 DAY READER) DEVI, USE TO CHECK BLOOD SUGARS FOUR  TIMES A DAY DX:E11.65, Disp: 1 each, Rfl: 1   Continuous Blood Gluc Sensor (FREESTYLE LIBRE 2 SENSOR) MISC, apply every 14 DAYS, Disp: 2 each, Rfl: 1   glucagon (GLUCAGEN HYPOKIT) 1 MG SOLR injection, Inject 1 mg into the vein once as needed for up to 1 dose for low blood sugar., Disp: 1 each, Rfl: 5   glucose blood (FREESTYLE TEST STRIPS) test strip, Use as instructed, Disp: 100 each, Rfl: 12   glucose blood (FREESTYLE TEST STRIPS) test strip, Use as instructed, Disp: 100 each, Rfl: 12   glucose blood test strip, Use as instructed, Disp:  100 each, Rfl: 5   glucose blood test strip, Use as instructed to check blood sugar 3 times a day. Dx code e11.65, Disp: 100 each, Rfl: 12   Lancets (ONETOUCH ULTRASOFT) lancets, Use as instructed to check blood sugars 4 times daily E11.9, Disp: 300 each, Rfl: 3   lisinopril (ZESTRIL) 10 MG tablet, Take 1 tablet (10 mg total) by mouth daily., Disp: 90 tablet, Rfl: 0   metFORMIN (GLUCOPHAGE XR) 750 MG 24 hr tablet, Take 1 tablet (750 mg total) by mouth daily with breakfast., Disp: 90 tablet, Rfl: 0   Semaglutide,0.25 or 0.5MG/DOS, (OZEMPIC, 0.25 OR 0.5 MG/DOSE,) 2 MG/1.5ML SOPN, Inject 0.5 mg into the skin once a week., Disp: 4.5 mL, Rfl: 1   Zoster Vaccine Adjuvanted (SHINGRIX) injection, Inject 0.5 mLs into the muscle once for 1 dose., Disp: 0.5 mL, Rfl: 0   insulin degludec (TRESIBA FLEXTOUCH) 100 UNIT/ML FlexTouch Pen, Inject 15 Units into the skin at bedtime., Disp: 15 mL, Rfl: 1   pneumococcal 13-valent conjugate vaccine (PREVNAR 13) SUSP injection, Inject 0.5 mLs into the muscle tomorrow at 10 am for 1 dose., Disp: 0.5 mL, Rfl: 0   No Known Allergies   Review of Systems  Constitutional: Negative.  Negative for fatigue.  Respiratory: Negative.    Cardiovascular: Negative.   Endocrine: Negative for polydipsia, polyphagia and polyuria.  Neurological: Negative.  Negative for dizziness.  Psychiatric/Behavioral: Negative.  Negative for confusion. The patient is not nervous/anxious.     Today's Vitals   06/22/21 1409  BP: 120/68  Pulse: 69  Temp: 98.2 F (36.8 C)  Weight: 150 lb 6.4 oz (68.2 kg)  Height: 5' 2.2" (1.58 m)  PainSc: 0-No pain   Body mass index is 27.33 kg/m.   Objective:  Physical Exam Vitals reviewed.  Constitutional:      General: She is not in acute distress.    Appearance: Normal appearance. She is well-developed.  Cardiovascular:     Rate and Rhythm: Normal rate and regular rhythm.     Pulses: Normal pulses.     Heart sounds: Normal heart sounds. No murmur  heard. Pulmonary:     Effort: Pulmonary effort is normal. No respiratory distress.     Breath sounds: Normal breath sounds. No wheezing.  Chest:     Chest wall: No tenderness.  Skin:    General: Skin is warm and dry.     Capillary Refill: Capillary refill takes less than 2 seconds.  Neurological:     General: No focal deficit present.     Mental Status: She is alert and oriented to person, place, and time.     Cranial Nerves: No cranial nerve deficit.     Motor: No weakness.  Psychiatric:        Mood and Affect: Mood normal.        Behavior: Behavior normal.  Thought Content: Thought content normal.        Judgment: Judgment normal.        Assessment And Plan:     1. 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  - insulin degludec (TRESIBA FLEXTOUCH) 100 UNIT/ML FlexTouch Pen; Inject 15 Units into the skin at bedtime.  Dispense: 15 mL; Refill: 1 - Lipid panel - CMP14+EGFR - Hemoglobin A1c  2. Mixed hyperlipidemia Comments: Stable, continue current medications  3. Immunization due Influenza vaccine administered Encouraged to take Tylenol as needed for fever or muscle aches. - Flu Vaccine QUAD High Dose(Fluad) - pneumococcal 13-valent conjugate vaccine (PREVNAR 13) SUSP injection; Inject 0.5 mLs into the muscle tomorrow at 10 am for 1 dose.  Dispense: 0.5 mL; Refill: 0 - Zoster Vaccine Adjuvanted [facility]) injection; Inject 0.5 mLs into the muscle once for 1 dose.  Dispense: 0.5 mL; Refill: 0  4. BMI 27.0-27.9,adult     Patient was given opportunity to ask questions. Patient verbalized understanding of the plan and was able to repeat key elements of the plan. All questions were answered to their satisfaction.   Shannon Brine, FNP   I, Shannon Brine, FNP, have reviewed all documentation for this visit. The documentation on 06/22/21 for the exam, diagnosis, procedures, and orders are all accurate and complete.   IF YOU HAVE BEEN REFERRED TO A SPECIALIST, IT MAY TAKE 1-2 WEEKS TO SCHEDULE/PROCESS THE REFERRAL. IF YOU HAVE NOT HEARD FROM US/SPECIALIST IN TWO WEEKS, PLEASE GIVE [place] A CALL AT [phone].   THE PATIENT IS ENCOURAGED TO PRACTICE SOCIAL DISTANCING DUE TO THE COVID-19 PANDEMIC.

## 2021-06-22 NOTE — Patient Instructions (Signed)

## 2021-06-23 LAB — CMP14+EGFR
ALT: 30 IU/L (ref 0–32)
AST: 22 IU/L (ref 0–40)
Albumin/Globulin Ratio: 2.5 — ABNORMAL HIGH (ref 1.2–2.2)
Albumin: 4.7 g/dL (ref 3.8–4.8)
Alkaline Phosphatase: 88 IU/L (ref 44–121)
BUN/Creatinine Ratio: 23 (ref 12–28)
BUN: 15 mg/dL (ref 8–27)
Bilirubin Total: 0.3 mg/dL (ref 0.0–1.2)
CO2: 26 mmol/L (ref 20–29)
Calcium: 9.8 mg/dL (ref 8.7–10.3)
Chloride: 103 mmol/L (ref 96–106)
Creatinine, Ser: 0.65 mg/dL (ref 0.57–1.00)
Globulin, Total: 1.9 g/dL (ref 1.5–4.5)
Glucose: 95 mg/dL (ref 70–99)
Potassium: 4.7 mmol/L (ref 3.5–5.2)
Sodium: 141 mmol/L (ref 134–144)
Total Protein: 6.6 g/dL (ref 6.0–8.5)
eGFR: 96 mL/min/{1.73_m2} (ref >59)

## 2021-06-23 LAB — LIPID PANEL
Chol/HDL Ratio: 2.3 ratio (ref 0.0–4.4)
Cholesterol, Total: 137 mg/dL (ref 100–199)
HDL: 60 mg/dL (ref >39)
LDL Chol Calc (NIH): 49 mg/dL (ref 0–99)
Triglycerides: 168 mg/dL — ABNORMAL HIGH (ref 0–149)
VLDL Cholesterol Cal: 28 mg/dL (ref 5–40)

## 2021-06-23 LAB — HEMOGLOBIN A1C
Est. average glucose Bld gHb Est-mCnc: 134 mg/dL
Hgb A1c MFr Bld: 6.3 % — ABNORMAL HIGH (ref 4.8–5.6)

## 2021-06-27 ENCOUNTER — Encounter (HOSPITAL_COMMUNITY): Payer: Self-pay | Admitting: Emergency Medicine

## 2021-06-27 ENCOUNTER — Ambulatory Visit (HOSPITAL_COMMUNITY)
Admission: EM | Admit: 2021-06-27 | Discharge: 2021-06-27 | Disposition: A | Discharge status: Home / Self Care | Payer: HMO | Attending: Internal Medicine | Admitting: Internal Medicine

## 2021-06-27 ENCOUNTER — Other Ambulatory Visit: Payer: Self-pay

## 2021-06-27 DIAGNOSIS — U071 COVID-19: Secondary | ICD-10-CM | POA: Insufficient documentation

## 2021-06-27 DIAGNOSIS — J029 Acute pharyngitis, unspecified: Secondary | ICD-10-CM | POA: Diagnosis not present

## 2021-06-27 DIAGNOSIS — E119 Type 2 diabetes mellitus without complications: Secondary | ICD-10-CM | POA: Insufficient documentation

## 2021-06-27 DIAGNOSIS — R519 Headache, unspecified: Secondary | ICD-10-CM | POA: Diagnosis present

## 2021-06-27 MED ORDER — BENZONATATE 100 MG PO CAPS: 100.0000 mg | ORAL_CAPSULE | Freq: Three times a day (TID) | ORAL | 0 refills | Status: DC | PRN

## 2021-06-27 NOTE — ED Triage Notes (Signed)
Pt reports that she got flu shot on Thursday. On Saturday started feeling bad with congestion, cough, body aches, headache.

## 2021-06-27 NOTE — ED Provider Notes (Signed)
[facility]    CSN: [id number] Arrival date & time: 06/27/21  1210      History   Chief Complaint Chief Complaint  Patient presents with   Headache   Nasal Congestion    HPI GITEL BESTE is a 66 y.o. female with history of diabetes mellitus type 2 comes to urgent care with 3 to 4-day history of nasal congestion, nonproductive cough, global headaches and generalized body aches.  Patient's symptoms started a day or 2 after she was vaccinated against flu.  No sick contacts.  Patient works in skilled facility.  No shortness of breath or wheezing.  Patient is fully vaccinated against COVID-19 virus.   HPI  Past Medical History:  Diagnosis Date   COVID-19 virus infection 10/13/2018   Diabetes mellitus without complication ([place])    Hypercholesteremia    Sleep apnea    Newly diagnosed in September 2021    Patient Active Problem List   Diagnosis Date Noted   Hypertensive retinopathy of both eyes 07/06/2020   Type 2 macular telangiectasis of right eye 05/04/2020   Sleep apnea in adult 03/16/2020   Vitreous floaters of left eye 12/29/2019   Other long term (current) drug therapy 11/16/2019   Snores 10/27/2019   Severe nonproliferative diabetic retinopathy of right eye ([place]) 10/06/2019   Severe nonproliferative diabetic retinopathy of left eye ([place]) 10/06/2019   Retinal exudates and deposits 10/06/2019   Other fatigue 11/20/2018   Hypokalemia 10/13/2018   Pyelonephritis 06/17/2018    Past Surgical History:  Procedure Laterality Date   ABDOMINAL HYSTERECTOMY     CATARACT EXTRACTION Bilateral    CESAREAN SECTION     MYOMECTOMY      OB History   No obstetric history on file.      Home Medications    Prior to Admission medications   Medication Sig Start Date End Date Taking? Authorizing Provider  benzonatate (TESSALON) 100 MG capsule Take 1 capsule (100 mg total) by mouth 3 (three) times daily as needed for cough. 06/27/21  Yes Samanatha Brammer, Myrene Galas, MD   atorvastatin (LIPITOR) 20 MG tablet TAKE ONE TABLET BY MOUTH EVERY EVENING with A meal 03/06/21   [name], FNP  blood glucose meter kit and supplies KIT Dispense based on patient and insurance preference. Use 3 times daily as directed to check blood sugar. Dx code e11.65 03/09/20   [name], FNP  Blood Glucose Monitoring Suppl (ONE TOUCH ULTRA MINI) w/Device KIT 1 each by Does not apply route 4 (four) times daily -  before meals and at bedtime. 11/16/19   [name], FNP  Cholecalciferol (VITAMIN D-3 PO) Take 1 capsule by mouth daily. 2000 units    [provider]  Continuous Blood Gluc Receiver (FREESTYLE LIBRE 14 DAY READER) DEVI USE TO CHECK BLOOD SUGARS FOUR TIMES A DAY DX:E11.65 03/23/20   [name], FNP  Continuous Blood Gluc Sensor (FREESTYLE LIBRE 2 SENSOR) MISC apply every 14 DAYS 06/07/21   [name], FNP  glucagon (GLUCAGEN HYPOKIT) 1 MG SOLR injection Inject 1 mg into the vein once as needed for up to 1 dose for low blood sugar. 02/22/21   [name], FNP  glucose blood (FREESTYLE TEST STRIPS) test strip Use as instructed 05/24/21   [name], FNP  glucose blood (FREESTYLE TEST STRIPS) test strip Use as instructed 05/24/21   [name], FNP  glucose blood test strip Use as instructed 01/11/20   [name], FNP  glucose blood test strip Use as  instructed to check blood sugar 3 times a day. Dx code e11.65 03/09/20   [name], FNP  insulin degludec (TRESIBA FLEXTOUCH) 100 UNIT/ML FlexTouch Pen Inject 15 Units into the skin at bedtime. 06/22/21   [name], FNP  Lancets [facility] ULTRASOFT) lancets Use as instructed to check blood sugars 4 times daily E11.9 11/24/19   [name], FNP  lisinopril (ZESTRIL) 10 MG tablet Take 1 tablet (10 mg total) by mouth daily. 06/07/21   [name], FNP  metFORMIN (GLUCOPHAGE XR) 750 MG 24 hr tablet Take 1 tablet (750 mg total) by mouth daily with breakfast. 06/07/21 06/07/22  [name], FNP   Semaglutide,0.25 or 0.5MG/DOS, (OZEMPIC, 0.25 OR 0.5 MG/DOSE,) 2 MG/1.5ML SOPN Inject 0.5 mg into the skin once a week. 11/17/20   [name], FNP    Family History Family History  Problem Relation Age of Onset   Hypertension Mother    Diabetes Mother    Stroke Mother    Diabetes Father    Hypertension Father    Stroke Father    Hypertension Son    Diabetes Son    Breast cancer Neg Hx     Social History Social History   Tobacco Use   Smoking status: Former    Types: Cigarettes    Quit date: 2006    Years since quitting: 17.0   Smokeless tobacco: Never   Tobacco comments:    quit 10 yrs  Vaping Use   Vaping Use: Never used  Substance Use Topics   Alcohol use: Yes    Comment: 1 glass of wine sometimes daily    Drug use: Never     Allergies   Patient has no known allergies.   Review of Systems Review of Systems  Constitutional: Negative.   HENT:  Positive for congestion and rhinorrhea.   Eyes: Negative.   Respiratory:  Positive for cough. Negative for shortness of breath, wheezing and stridor.   Cardiovascular: Negative.  Negative for chest pain.  Gastrointestinal: Negative.   Musculoskeletal:  Positive for myalgias.  Skin: Negative.   Neurological:  Positive for headaches. Negative for syncope.    Physical Exam Triage Vital Signs ED Triage Vitals [06/27/21 1439]  Enc Vitals Group     BP (!) 115/99     Pulse Rate 88     Resp 18     Temp 99.8 F (37.7 C)     Temp Source Oral     SpO2 98 %     Weight      Height      Head Circumference      Peak Flow      Pain Score 6     Pain Loc      Pain Edu?      Excl. in [place]?    No data found.  Updated Vital Signs BP (!) 115/99 (BP Location: Right Arm)    Pulse 88    Temp 99.8 F (37.7 C) (Oral)    Resp 18    SpO2 98%   Visual Acuity Right Eye Distance:   Left Eye Distance:   Bilateral Distance:    Right Eye Near:   Left Eye Near:    Bilateral Near:     Physical Exam Vitals reviewed.   Constitutional:      General: She is not in acute distress.    Appearance: She is ill-appearing.  HENT:     Mouth/Throat:     Mouth: Mucous membranes are moist.  Cardiovascular:  Rate and Rhythm: Normal rate and regular rhythm.     Heart sounds: Normal heart sounds.  Pulmonary:     Effort: Pulmonary effort is normal. No respiratory distress.     Breath sounds: No stridor. No wheezing.  Abdominal:     General: Bowel sounds are normal.     Palpations: Abdomen is soft.  Neurological:     Mental Status: She is alert.     GCS: GCS eye subscore is 4. GCS verbal subscore is 5. GCS motor subscore is 6.     UC Treatments / Results  Labs (all labs ordered are listed, but only abnormal results are displayed) Labs Reviewed  SARS CORONAVIRUS 2 (TAT 6-24 HRS)    EKG   Radiology No results found.  Procedures Procedures (including critical care time)  Medications Ordered in UC Medications - No data to display  Initial Impression / Assessment and Plan / UC Course  I have reviewed the triage vital signs and the nursing notes.  Pertinent labs & imaging results that were available during my care of the patient were reviewed by me and considered in my medical decision making (see chart for details).     Acute viral illness COVID-19 PCR test has been sent Increase fluid intake  Tessalon perles as needed for cough We will call you with recommendations if labs are abnormal  Tylenol as needed for fever or body aches Return if symptoms worsens. Final Clinical Impressions(s) / UC Diagnoses   Final diagnoses:  Acute viral pharyngitis     Discharge Instructions      Increase oral fluid intake Please take medications as prescribed We will call you with recommendations if labs are abnormal Return to urgent care if symptoms worsen.   ED Prescriptions     Medication Sig Dispense Auth. Provider   benzonatate (TESSALON) 100 MG capsule Take 1 capsule (100 mg total) by mouth  3 (three) times daily as needed for cough. 21 capsule Reo Portela, Myrene Galas, MD      PDMP not reviewed this encounter.   Chase Picket, MD 06/27/21 [phone]

## 2021-06-27 NOTE — Discharge Instructions (Addendum)
Increase oral fluid intake Please take medications as prescribed We will call you with recommendations if labs are abnormal Return to urgent care if symptoms worsen. 

## 2021-06-28 ENCOUNTER — Telehealth: Payer: Self-pay

## 2021-06-28 LAB — SARS CORONAVIRUS 2 (TAT 6-24 HRS): SARS Coronavirus 2: POSITIVE — AB

## 2021-06-28 NOTE — Telephone Encounter (Signed)
I left the pt a message that her Ozempic is ready for pickup from the [employer] pt assistance program

## 2021-06-29 ENCOUNTER — Telehealth: Payer: Self-pay

## 2021-07-11 ENCOUNTER — Telehealth: Payer: Self-pay

## 2021-07-11 NOTE — Progress Notes (Signed)
Chronic Care Management Pharmacy Assistant   Name: Shannon [name]  MRN: [id number] DOB: 1956-06-13   Reason for Encounter: Medication Review/ Medication Coordination Call    Recent office visits:   06/22/2022 [name] FNP ( PCP) given Pneumococcal 13-Val Conj Vacc 0.5 mLs Intramuscular Tomorrow-1000, Zoster Vac Recomb Adjuvanted 50 MCG/0.5ML 0.5 mLs Intramuscular  Once    Recent consult visits: None  Hospital visits:  Medication Reconciliation was completed by comparing discharge summary, patients EMR and Pharmacy list, and upon discussion with patient.  [facility] on 06/27/2021 due to Acute viral pharyngitis. Discharge date was 06/27/2021.    New?Medications Started at [facility] Discharge:?? Benzonatate 100 mg Oral 3 times daily PRN due to Acute viral pharyngitis  Medication Changes at Hospital Discharge: None  Medications Discontinued at Hospital Discharge: None  Medications that remain the same after Hospital Discharge:??  -All other medications will remain the same.    Medications: Outpatient Encounter Medications as of 07/11/2021  Medication Sig   atorvastatin (LIPITOR) 20 MG tablet TAKE ONE TABLET BY MOUTH EVERY EVENING with A meal   benzonatate (TESSALON) 100 MG capsule Take 1 capsule (100 mg total) by mouth 3 (three) times daily as needed for cough.   blood glucose meter kit and supplies KIT Dispense based on patient and insurance preference. Use 3 times daily as directed to check blood sugar. Dx code e11.65   Blood Glucose Monitoring Suppl (ONE TOUCH ULTRA MINI) w/Device KIT 1 each by Does not apply route 4 (four) times daily -  before meals and at bedtime.   Cholecalciferol (VITAMIN D-3 PO) Take 1 capsule by mouth daily. 2000 units   Continuous Blood Gluc Receiver (FREESTYLE LIBRE 14 DAY READER) DEVI USE TO CHECK BLOOD SUGARS FOUR TIMES A DAY DX:E11.65   Continuous Blood Gluc Sensor (FREESTYLE LIBRE 2 SENSOR) MISC apply every 14 DAYS   glucagon  (GLUCAGEN HYPOKIT) 1 MG SOLR injection Inject 1 mg into the vein once as needed for up to 1 dose for low blood sugar.   glucose blood (FREESTYLE TEST STRIPS) test strip Use as instructed   glucose blood (FREESTYLE TEST STRIPS) test strip Use as instructed   glucose blood test strip Use as instructed   glucose blood test strip Use as instructed to check blood sugar 3 times a day. Dx code e11.65   insulin degludec (TRESIBA FLEXTOUCH) 100 UNIT/ML FlexTouch Pen Inject 15 Units into the skin at bedtime.   Lancets (ONETOUCH ULTRASOFT) lancets Use as instructed to check blood sugars 4 times daily E11.9   lisinopril (ZESTRIL) 10 MG tablet Take 1 tablet (10 mg total) by mouth daily.   metFORMIN (GLUCOPHAGE XR) 750 MG 24 hr tablet Take 1 tablet (750 mg total) by mouth daily with breakfast.   Semaglutide,0.25 or 0.5MG/DOS, (OZEMPIC, 0.25 OR 0.5 MG/DOSE,) 2 MG/1.5ML SOPN Inject 0.5 mg into the skin once a week.   No facility-administered encounter medications on file as of 07/11/2021.   Reviewed chart for medication changes ahead of medication coordination call.  No medication changes indicated OR if recent visit, treatment plan here.  BP Readings from Last 3 Encounters:  06/27/21 (!) 115/99  06/22/21 120/68  02/22/21 122/70    Lab Results  Component Value Date   HGBA1C 6.3 (H) 06/22/2021     Patient obtains medications through Adherence Packaging  30 Days   Last adherence delivery included:  Atorvastatin 20 mg 1 tablet daily (bedtime) Metformin 750 mg 1 tablet daily (breakfast) Lisinopril 10  mg 1 tablet daily (breakfast) Vitamin D3 2000 units- 1 capsule daily (evening meal) Freestyle Libre Kit 2 Sensors- Use as directed every 14 days  Patient did not declined any meds last month.  Patient is due for next adherence delivery on: 07/20/2021.  This delivery to include: Atorvastatin 20 mg 1 tablet daily (evening meal) Metformin 750 mg 1 tablet daily (breakfast) Lisinopril 10 mg 1 tablet  daily (breakfast) Vitamin D3 50 mcg - 1 capsule daily (evening meal) Freestyle Libre Kit 2 Sensors- Use as directed every 14 days  Patient will not need a short fill/ acute fill.  Patient does not needs refills.  Called patient left message 07/11/2021. Called patient left message 07/12/2021.  Patient did not confirmed delivery date of 07/20/2021. The pharmacy will contact them the morning of delivery.    [place] Pharmacist Assistant [phone]

## 2021-07-17 DIAGNOSIS — G4733 Obstructive sleep apnea (adult) (pediatric): Secondary | ICD-10-CM | POA: Diagnosis not present

## 2021-07-25 ENCOUNTER — Telehealth: Payer: Self-pay

## 2021-07-25 NOTE — Chronic Care Management (AMB) (Signed)
° ° °  Called [name], No answer, left message of appointment on 07-26-2021 at 2:00 via telephone visit with Cherylin Mylar, Pharm D. Notified to have all medications, supplements, blood pressure and/or blood sugar logs available during appointment and to return call if need to reschedule.   Care Gaps: Shingrix overdue PNA Vac overdue Covid booster overdue last completed 01-11-2021 AWV 11-30-2021  Star Rating Drug: Atorvastatin 20 mg- Last filled 06-13-2021 [facility] Metformin 750 mg- Last filled 06-13-2021 [facility] Lisinopril 10 mg- Last filled 06-13-2021 [facility] Ozempic - Patient assistance  Any gaps in medications fill history? No  Hue[facility] Clinical Pharmacist Assistant [phone]

## 2021-07-26 ENCOUNTER — Ambulatory Visit (INDEPENDENT_AMBULATORY_CARE_PROVIDER_SITE_OTHER): Payer: HMO

## 2021-07-26 DIAGNOSIS — E782 Mixed hyperlipidemia: Secondary | ICD-10-CM

## 2021-07-26 DIAGNOSIS — Z794 Long term (current) use of insulin: Secondary | ICD-10-CM

## 2021-07-26 DIAGNOSIS — E113493 Type 2 diabetes mellitus with severe nonproliferative diabetic retinopathy without macular edema, bilateral: Secondary | ICD-10-CM

## 2021-07-26 NOTE — Progress Notes (Signed)
Chronic Care Management Pharmacy Note  08/02/2021 Name:  Shannon [name] MRN:  [id number] DOB:  [birth year]  Summary: Patient reports that she is taking her medication everyday.   Recommendations/Changes made from today's visit: Recommend that patient medication be continued with out changes at this time.   Plan: Patient to get pneumonia vaccine when she comes to her next office visit.    Subjective: Shannon [name] is an 66 y.o. year old female who is a primary patient of [name], [facility].  The CCM team was consulted for assistance with disease management and care coordination needs.  She is still working two days a week from 7 P-7A her eating habits are not as good as the rest of week.  Engaged with patient by telephone for follow up visit in response to provider referral for pharmacy case management and/or care coordination services.   Consent to Services:  The patient was given information about Chronic Care Management services, agreed to services, and gave verbal consent prior to initiation of services.  Please see initial visit note for detailed documentation.   Patient Care Team: [name], FNP as PCP - General (General Practice) Mayford Knife, [facility] (Pharmacist)  Recent office visits: 06/22/2021 PCP OV  Recent consult visits: 05/11/2021 [facility] visits: None in previous 6 months   Objective:  Lab Results  Component Value Date   CREATININE 0.65 06/22/2021   BUN 15 06/22/2021   EGFR 96 06/22/2021   GFRNONAA 93 05/23/2020   GFRAA 107 05/23/2020   NA 141 06/22/2021   K 4.7 06/22/2021   CALCIUM 9.8 06/22/2021   CO2 26 06/22/2021   GLUCOSE 95 06/22/2021    Lab Results  Component Value Date/Time   HGBA1C 6.3 (H) 06/22/2021 02:43 PM   HGBA1C 6.5 (H) 02/22/2021 09:03 AM   MICROALBUR 30 11/17/2020 12:05 PM   MICROALBUR 80 08/11/2019 10:29 AM    Last diabetic Eye exam:  Lab Results  Component Value Date/Time   HMDIABEYEEXA  Retinopathy (A) 09/14/2019 12:00 AM    Last diabetic Foot exam: No results found for: HMDIABFOOTEX   Lab Results  Component Value Date   CHOL 137 06/22/2021   HDL 60 06/22/2021   LDLCALC 49 06/22/2021   TRIG 168 (H) 06/22/2021   CHOLHDL 2.3 06/22/2021    Hepatic Function Latest Ref Rng & Units 06/22/2021 11/17/2020 08/22/2020  Total Protein 6.0 - 8.5 g/dL 6.6 7.6 7.0  Albumin 3.8 - 4.8 g/dL 4.7 4.9(H) 4.6  AST 0 - 40 IU/L _0 ALT 0 - 32 IU/L 30 34(H) 31  Alk Phosphatase 44 - 121 IU/L 88 104 92  Total Bilirubin 0.0 - 1.2 mg/dL 0.3 0.4 0.3    Lab Results  Component Value Date/Time   TSH 1.700 11/16/2019 03:50 PM   TSH 0.797 08/29/2018 10:37 AM    CBC Latest Ref Rng & Units 01/18/2021 11/16/2019 10/18/2018  WBC 3.4 - 10.8 x10E3/uL 3.6 5.8 6.5  Hemoglobin 11.1 - 15.9 g/dL 11.3 11.2 11.7(L)  Hematocrit 34.0 - 46.6 % 35.0 33.9(L) 36.0  Platelets 150 - 450 x10E3/uL 167 213 204    No results found for: VD25OH  Clinical ASCVD: No  The 10-year ASCVD risk score (Arnett DK, et al., 2019) is: 24.5%   Values used to calculate the score:     Age: 38 years     Sex: Female     Is Non-Hispanic African American: Yes     Diabetic:  Yes     Tobacco smoker: Yes     Systolic Blood Pressure: 680 mmHg     Is BP treated: Yes     HDL Cholesterol: 60 mg/dL     Total Cholesterol: 137 mg/dL    Depression screen [facility] 2/9 11/17/2020 04/20/2020 11/16/2019  Decreased Interest 0 0 0  Down, Depressed, Hopeless 0 0 0  PHQ - 2 Score 0 0 0  Altered sleeping - - 0  Tired, decreased energy - - 0  Change in appetite - - 0  Feeling bad or failure about yourself  - - 0  Trouble concentrating - - 0  Moving slowly or fidgety/restless - - 0  Suicidal thoughts - - 0  PHQ-9 Score - - 0  Difficult doing work/chores - - Not difficult at all     Social History   Tobacco Use  Smoking Status Former   Types: Cigarettes   Quit date: 2006   Years since quitting: 17.1  Smokeless Tobacco Never  Tobacco  Comments   quit 10 yrs   BP Readings from Last 3 Encounters:  06/27/21 (!) 115/99  06/22/21 120/68  02/22/21 122/70   Pulse Readings from Last 3 Encounters:  06/27/21 88  06/22/21 69  02/22/21 85   Wt Readings from Last 3 Encounters:  06/22/21 150 lb 6.4 oz (68.2 kg)  02/22/21 149 lb 12.8 oz (67.9 kg)  01/18/21 151 lb 9.6 oz (68.8 kg)   BMI Readings from Last 3 Encounters:  06/22/21 27.33 kg/m  02/22/21 26.54 kg/m  01/18/21 26.85 kg/m    Assessment/Interventions: Review of patient past medical history, allergies, medications, health status, including review of consultants reports, laboratory and other test data, was performed as part of comprehensive evaluation and provision of chronic care management services.   SDOH:  (Social Determinants of Health) assessments and interventions performed: Yes  SDOH Screenings   Alcohol Screen: Not on file  Depression (PHQ2-9): Low Risk    PHQ-2 Score: 0  Financial Resource Strain: Low Risk    Difficulty of Paying Living Expenses: Not hard at all  Food Insecurity: No Food Insecurity   Worried About Charity fundraiser in the Last Year: Never true   Ran Out of Food in the Last Year: Never true  Housing: Not on file  Physical Activity: Sufficiently Active   Days of Exercise per Week: 4 days   Minutes of Exercise per Session: 50 min  Social Connections: Not on file  Stress: No Stress Concern Present   Feeling of Stress : Not at all  Tobacco Use: Medium Risk   Smoking Tobacco Use: Former   Smokeless Tobacco Use: Never   Passive Exposure: Not on file  Transportation Needs: No Transportation Needs   Lack of Transportation (Medical): No   Lack of Transportation (Non-Medical): No    CCM Care Plan  No Known Allergies  Medications Reviewed Today     Reviewed by Mayford Knife, RPH (Pharmacist) on 08/02/21 at 1453  Med List Status: <None>   Medication Order Taking? Sig Documenting Provider Last Dose Status Informant   atorvastatin (LIPITOR) 20 MG tablet [id number] Yes TAKE ONE TABLET BY MOUTH EVERY EVENING with A meal [name], FNP  Active   benzonatate (TESSALON) 100 MG capsule [id number] Yes Take 1 capsule (100 mg total) by mouth 3 (three) times daily as needed for cough. Chase Picket, MD  Active   blood glucose meter kit and supplies KIT [id number] Yes Dispense based on patient and  insurance preference. Use 3 times daily as directed to check blood sugar. Dx code e11.65 [name], FNP  Active   Blood Glucose Monitoring Suppl (ONE TOUCH ULTRA MINI) w/Device KIT [id number] Yes 1 each by Does not apply route 4 (four) times daily -  before meals and at bedtime. [name], FNP  Active   Cholecalciferol (VITAMIN D-3 PO) [id number] Yes Take 1 capsule by mouth daily. 2000 units [provider]  Active Self  Continuous Blood Gluc Receiver (FREESTYLE LIBRE 14 DAY READER) DEVI [id number] Yes USE TO CHECK BLOOD SUGARS FOUR TIMES A DAY DX:E11.65 [name], FNP  Active   Continuous Blood Gluc Sensor (FREESTYLE LIBRE 2 SENSOR) [place] [id number] Yes apply every 14 DAYS [name], FNP  Active   glucagon (GLUCAGEN HYPOKIT) 1 MG SOLR injection [id number] Yes Inject 1 mg into the vein once as needed for up to 1 dose for low blood sugar. [name], FNP  Active   glucose blood (FREESTYLE TEST STRIPS) test strip [id number] Yes Use as instructed [name], FNP  Active   glucose blood (FREESTYLE TEST STRIPS) test strip [id number] Yes Use as instructed [name], FNP  Active   glucose blood test strip [id number] Yes Use as instructed [name], FNP  Active   glucose blood test strip [id number] Yes Use as instructed to check blood sugar 3 times a day. Dx code e11.65 [name], FNP  Active   insulin degludec (TRESIBA FLEXTOUCH) 100 UNIT/ML FlexTouch Pen [id number] Yes Inject 15 Units into the skin at bedtime. [name], FNP  Active   Lancets [facility] ULTRASOFT) lancets [id number] Yes Use as  instructed to check blood sugars 4 times daily E11.9 [name], FNP  Active   lisinopril (ZESTRIL) 10 MG tablet [id number] Yes Take 1 tablet (10 mg total) by mouth daily. [name], FNP  Active   metFORMIN (GLUCOPHAGE XR) 750 MG 24 hr tablet [id number] Yes Take 1 tablet (750 mg total) by mouth daily with breakfast. [name], FNP  Active   Semaglutide,0.25 or 0.5MG/DOS, (OZEMPIC, 0.25 OR 0.5 MG/DOSE,) 2 MG/1.5ML SOPN [id number] Yes Inject 0.5 mg into the skin once a week. [name], FNP  Active             Patient Active Problem List   Diagnosis Date Noted   Hypertensive retinopathy of both eyes 07/06/2020   Type 2 macular telangiectasis of right eye 05/04/2020   Sleep apnea in adult 03/16/2020   Vitreous floaters of left eye 12/29/2019   Other long term (current) drug therapy 11/16/2019   Snores 10/27/2019   Severe nonproliferative diabetic retinopathy of right eye ([place]) 10/06/2019   Severe nonproliferative diabetic retinopathy of left eye ([place]) 10/06/2019   Retinal exudates and deposits 10/06/2019   Other fatigue 11/20/2018   Hypokalemia 10/13/2018   Pyelonephritis 06/17/2018    Immunization History  Administered Date(s) Administered   Fluad Quad(high Dose 65+) 03/22/2020, 06/22/2021   Influenza,inj,Quad PF,6+ Mos 04/18/2018   Influenza-Unspecified 05/01/2019   PFIZER(Purple Top)SARS-COV-2 Vaccination 09/18/2019, 10/14/2019, 04/19/2020, 01/11/2021   Pneumococcal Polysaccharide-23 05/30/2018   Tdap 11/28/2017   Zoster Recombinat (Shingrix) 02/22/2021    Conditions to be addressed/monitored:  Hypertension and Diabetes  Care Plan : [place]  Updates made by Mayford Knife, RPH since 08/02/2021 12:00 AM     Problem: HLD, DMII   Priority: High     Long-Range Goal: Disease Management   Recent Progress: On track  Priority: High  Note:   Current Barriers:  Unable to independently afford treatment regimen  Pharmacist Clinical Goal(s):   Patient will achieve adherence to monitoring guidelines and medication adherence to achieve therapeutic efficacy maintain control of diabetes  as evidenced by A1c<7  through collaboration with PharmD and provider.   Interventions: 1:1 collaboration with [name], FNP regarding development and update of comprehensive plan of care as evidenced by provider attestation and co-signature Inter-disciplinary care team collaboration (see longitudinal plan of care) Comprehensive medication review performed; medication list updated in electronic medical record  Hyperlipidemia: (LDL goal < 70) -Controlled -Current treatment: Atorvastatin 20 mg tablet once per day  Appropriate, Effective, Safe, Accessible -Current dietary patterns: She is eating her celery, and smoothies and peanuts and nuts plenty of vegetables, she is avoiding a lot of red meat. She is eating a lot fish, chicken and tofu.  -Current exercise habits: please see diabetes for exercise  -Educated on Benefits of statin for ASCVD risk reduction; -Congratulated patient on her healthy eating habits  -Recommended to continue current medication  Diabetes (A1c goal <7%) -Controlled -Current medications: Ozempic 0.5 mg once per week Appropriate, Effective, Safe, Accessible Tresiba - inject 15 units at bedtime  Appropriate, Effective, Safe, Accessible Metformin XR 750 mg tablet once per day Appropriate, Effective, Safe, Accessible -Current home glucose readings: patient has been checking BS very closely, once or twice her BS was out of wack with COVID.  fasting glucose: 97, 104, 135, 85  -Denies hypoglycemic/hyperglycemic symptoms -Current meal patterns:  drinks: plenty of water -Current exercise: She is taking water aerobics 4 times per week and silver sneakers once per week. She does exercises for body parts through music. 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  She goes to the [employer]. She enjoys it because there is laughter and  fellowship, the instructor is Debria Garret.  -Educated on Complications of diabetes including kidney damage, retinal damage, and cardiovascular disease; Exercise goal of 150 minutes per week; -Counseled to check feet daily and get yearly eye exams -Recommended to continue current medication  Health Maintenance -Vaccine gaps: Shingrix, Pneumonia Vaccine - will receive at [place], COVID-19 Booster    Patient Goals/Self-Care Activities Patient will:  - take medications as prescribed as evidenced by patient report and record review  Follow Up Plan: The patient has been provided with contact information for the care management team and has been advised to call with any health related questions or concerns.       Medication Assistance: Application for Ozempic  medication assistance program. in process.  Anticipated assistance start date 07/2021.  See plan of care for additional detail.  Compliance/Adherence/Medication fill history: Care Gaps: Pneumonia Vaccine COVID-19 Vaccine  Shingrix Vaccine   Star-Rating Drugs: Atorvastatin 20 mg tablet  Lisinopril 10 mg tablet  Metformin 750 mg tablet  Ozempic 0.5 mg injection   Patient's preferred pharmacy is:  [facility] - [place], [place] - [address] [place] [address] Phone: [phone] Fax: [phone]  [id number] - [place], [place] - [address] [place] [address] Phone: [phone] Fax: [phone]  Uses pill box? Yes Pt endorses 90% compliance  We discussed: Benefits of medication synchronization, packaging and delivery as well as enhanced pharmacist oversight with [facility]. Patient decided to: Continue current medication management strategy  Care Plan and Follow Up Patient Decision:  Patient agrees to Care Plan and Follow-up.  Plan: The patient has  been provided with contact information for the care management team and has been advised to call with any health related questions or concerns.   [name], CPP, PharmD Clinical Pharmacist Practitioner [facility] [phone]

## 2021-08-02 NOTE — Patient Instructions (Addendum)
Visit Information It was great speaking with you today!  Please let me know if you have any questions about our visit.   Goals Addressed             This Visit's Progress    COMPLETED: Manage My Medicine       Timeframe:  Long-Range Goal Priority:  High Start Date:                             Expected End Date:                       Follow Up Date 01/16/2022   Completed:  - keep a list of all the medicines I take; vitamins and herbals too - use a pillbox to sort medicine - use an alarm clock or phone to remind me to take my medicine     Why is this important?   These steps will help you keep on track with your medicines.        Monitor and Manage My Blood Sugar-Diabetes Type 2       Timeframe:  Long-Range Goal Priority:  Medium Start Date:                             Expected End Date:                       Follow Up Date 01/16/2022:   In Progress:   - check blood sugar before and after exercise - check blood sugar if I feel it is too high or too low    Why is this important?   Checking your blood sugar at home helps to keep it from getting very high or very low.  Writing the results in a diary or log helps the doctor know how to care for you.  Your blood sugar log should have the time, date and the results.  Also, write down the amount of insulin or other medicine that you take.  Other information, like what you ate, exercise done and how you were feeling, will also be helpful.     Notes:  Please call with any questions that you might have        Patient Care Plan: [place]     Problem Identified: HLD, DMII   Priority: High     Long-Range Goal: Disease Management   Recent Progress: On track  Priority: High  Note:   Current Barriers:  Unable to independently afford treatment regimen  Pharmacist Clinical Goal(s):  Patient will achieve adherence to monitoring guidelines and medication adherence to achieve therapeutic efficacy maintain  control of diabetes  as evidenced by A1c<7  through collaboration with PharmD and provider.   Interventions: 1:1 collaboration with [name], FNP regarding development and update of comprehensive plan of care as evidenced by provider attestation and co-signature Inter-disciplinary care team collaboration (see longitudinal plan of care) Comprehensive medication review performed; medication list updated in electronic medical record  Hyperlipidemia: (LDL goal < 70) -Controlled -Current treatment: Atorvastatin 20 mg tablet once per day  Appropriate, Effective, Safe, Accessible -Current dietary patterns: She is eating her celery, and smoothies and peanuts and nuts plenty of vegetables, she is avoiding a lot of red meat. She is eating a lot fish, chicken and tofu.  -Current exercise habits: please see diabetes for exercise  -  Educated on Benefits of statin for ASCVD risk reduction; -Congratulated patient on her healthy eating habits  -Recommended to continue current medication  Diabetes (A1c goal <7%) -Controlled -Current medications: Ozempic 0.5 mg once per week Appropriate, Effective, Safe, Accessible Tresiba - inject 15 units at bedtime  Appropriate, Effective, Safe, Accessible Metformin XR 750 mg tablet once per day Appropriate, Effective, Safe, Accessible -Current home glucose readings: patient has been checking BS very closely, once or twice her BS was out of wack with COVID.  fasting glucose: 97, 104, 135, 85  -Denies hypoglycemic/hyperglycemic symptoms -Current meal patterns:  drinks: plenty of water -Current exercise: She is taking water aerobics 4 times per week and silver sneakers once per week. She does exercises for body parts through music. 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  She goes to the [employer]. She enjoys it because there is laughter and fellowship, the instructor is Debria Garret.  -Educated on Complications of diabetes including kidney damage, retinal damage, and  cardiovascular disease; Exercise goal of 150 minutes per week; -Counseled to check feet daily and get yearly eye exams -Recommended to continue current medication  Health Maintenance -Vaccine gaps: Shingrix, Pneumonia Vaccine - will receive at [place], COVID-19 Booster    Patient Goals/Self-Care Activities Patient will:  - take medications as prescribed as evidenced by patient report and record review  Follow Up Plan: The patient has been provided with contact information for the care management team and has been advised to call with any health related questions or concerns.       Patient agreed to services and verbal consent obtained.   The patient verbalized understanding of instructions, educational materials, and care plan provided today and agreed to receive a mailed copy of patient instructions, educational materials, and care plan.   [name], PharmD Clinical Pharmacist [facility] [phone]

## 2021-08-08 ENCOUNTER — Telehealth: Payer: Self-pay

## 2021-08-08 NOTE — Chronic Care Management (AMB) (Signed)
Chronic Care Management Pharmacy Assistant   Name: Shannon [name]  MRN: [id number] DOB: 10/23/1955  Reason for Encounter: Medication Review/ Medication Coordination  Recent office visits:  None  Recent consult visits:  None  Hospital visits:  Medication Reconciliation was completed by comparing discharge summary, patients EMR and Pharmacy list, and upon discussion with patient.   [facility] on 06/27/2021 due to Acute viral pharyngitis. Discharge date was 06/27/2021.     New?Medications Started at [facility] Discharge:?? Benzonatate 100 mg Oral 3 times daily PRN due to Acute viral pharyngitis   Medication Changes at Hospital Discharge: None   Medications Discontinued at Hospital Discharge: None   Medications that remain the same after Hospital Discharge:??  -All other medications will remain the same.      Medications: Outpatient Encounter Medications as of 08/08/2021  Medication Sig   atorvastatin (LIPITOR) 20 MG tablet TAKE ONE TABLET BY MOUTH EVERY EVENING with A meal   benzonatate (TESSALON) 100 MG capsule Take 1 capsule (100 mg total) by mouth 3 (three) times daily as needed for cough.   blood glucose meter kit and supplies KIT Dispense based on patient and insurance preference. Use 3 times daily as directed to check blood sugar. Dx code e11.65   Blood Glucose Monitoring Suppl (ONE TOUCH ULTRA MINI) w/Device KIT 1 each by Does not apply route 4 (four) times daily -  before meals and at bedtime.   Cholecalciferol (VITAMIN D-3 PO) Take 1 capsule by mouth daily. 2000 units   Continuous Blood Gluc Receiver (FREESTYLE LIBRE 14 DAY READER) DEVI USE TO CHECK BLOOD SUGARS FOUR TIMES A DAY DX:E11.65   Continuous Blood Gluc Sensor (FREESTYLE LIBRE 2 SENSOR) MISC apply every 14 DAYS   glucagon (GLUCAGEN HYPOKIT) 1 MG SOLR injection Inject 1 mg into the vein once as needed for up to 1 dose for low blood sugar.   glucose blood (FREESTYLE TEST STRIPS) test strip Use as  instructed   glucose blood (FREESTYLE TEST STRIPS) test strip Use as instructed   glucose blood test strip Use as instructed   glucose blood test strip Use as instructed to check blood sugar 3 times a day. Dx code e11.65   insulin degludec (TRESIBA FLEXTOUCH) 100 UNIT/ML FlexTouch Pen Inject 15 Units into the skin at bedtime.   Lancets (ONETOUCH ULTRASOFT) lancets Use as instructed to check blood sugars 4 times daily E11.9   lisinopril (ZESTRIL) 10 MG tablet Take 1 tablet (10 mg total) by mouth daily.   metFORMIN (GLUCOPHAGE XR) 750 MG 24 hr tablet Take 1 tablet (750 mg total) by mouth daily with breakfast.   Semaglutide,0.25 or 0.5MG/DOS, (OZEMPIC, 0.25 OR 0.5 MG/DOSE,) 2 MG/1.5ML SOPN Inject 0.5 mg into the skin once a week.   No facility-administered encounter medications on file as of 08/08/2021.  Reviewed chart for medication changes ahead of medication coordination call.  No OVs, Consults, or hospital visits since last care coordination call/Pharmacist visit.   No medication changes indicated   BP Readings from Last 3 Encounters:  06/27/21 (!) 115/99  06/22/21 120/68  02/22/21 122/70    Lab Results  Component Value Date   HGBA1C 6.3 (H) 06/22/2021     Patient obtains medications through Adherence Packaging  30 Days   Last adherence delivery included:  Atorvastatin 20 mg 1 tablet daily (evening meal) Metformin 750 mg 1 tablet daily (breakfast) Lisinopril 10 mg 1 tablet daily (breakfast) Vitamin D3 50 mcg - 1 capsule daily (evening meal) Freestyle  Libre Kit 2 Sensors- Use as directed every 14 days  Patient declined (meds) last month: Was unable to reach patient  Patient is due for next adherence delivery on: 08-18-2021  Called patient and reviewed medications and coordinated delivery.  This delivery to include: Atorvastatin 20 mg 1 tablet daily (evening meal) Metformin 750 mg 1 tablet daily (breakfast) Lisinopril 10 mg 1 tablet daily (breakfast) Vitamin D3 50 mcg - 1  capsule daily (evening meal) Freestyle Libre Kit 2 Sensors- Use as directed every 14 days  No short/acute fill needed  Patient declined the following medications: None  Patient needs refills for: None  Confirmed delivery date of  08-18-2021 advised patient that pharmacy will contact them the morning of delivery.  Care Gaps: Shingrix overdue PNA Vac overdue Covid booster overdue last completed 01-11-2021 AWV 11-30-2021  Star Rating Drugs: Atorvastatin 20 mg- Last filled 07-13-2021 30 DS [facility] Metformin 750 mg- Last filled 07-13-2021 30 DS [facility] Lisinopril 10 mg- Last filled 07-13-2021 30 DS [facility] Ozempic - Patient assistance  [place] Pharmacist Assistant [phone]

## 2021-08-11 ENCOUNTER — Other Ambulatory Visit: Payer: Self-pay | Admitting: Nurse Practitioner

## 2021-08-17 DIAGNOSIS — G4733 Obstructive sleep apnea (adult) (pediatric): Secondary | ICD-10-CM | POA: Diagnosis not present

## 2021-08-22 ENCOUNTER — Ambulatory Visit: Payer: HMO

## 2021-08-22 DIAGNOSIS — Z794 Long term (current) use of insulin: Secondary | ICD-10-CM | POA: Diagnosis not present

## 2021-08-22 DIAGNOSIS — E113493 Type 2 diabetes mellitus with severe nonproliferative diabetic retinopathy without macular edema, bilateral: Secondary | ICD-10-CM | POA: Diagnosis not present

## 2021-08-22 DIAGNOSIS — E782 Mixed hyperlipidemia: Secondary | ICD-10-CM

## 2021-08-22 NOTE — Progress Notes (Signed)
Chronic Care Management Pharmacy Note  08/22/2021 Name:  Shannon [name] MRN:  [id number] DOB:  09-May-1956  Summary: Patient reports that she is taking her medication everyday.   Recommendations/Changes made from today's visit: Recommend that patient medication be continued with out changes at this time.   Plan: Patient to get pneumonia vaccine when she comes to her next office visit.    Subjective: Shannon [name] is an 66 y.o. year old female who is a primary patient of [name], [place].  The CCM team was consulted for assistance with disease management and care coordination needs.  She is still working two days a week from 7 P[employer] her eating habits are not as good as the rest of week.  Engaged with patient by telephone for follow up visit in response to provider referral for pharmacy case management and/or care coordination services.   Consent to Services:  The patient was given information about Chronic Care Management services, agreed to services, and gave verbal consent prior to initiation of services.  Please see initial visit note for detailed documentation.   Patient Care Team: [name], FNP as PCP - General (General Practice) Mayford Knife, [facility] (Pharmacist)  Recent office visits: 06/22/2021 PCP OV  Recent consult visits: 05/11/2021 [facility] visits: None in previous 6 months   Objective:  Lab Results  Component Value Date   CREATININE 0.65 06/22/2021   BUN 15 06/22/2021   EGFR 96 06/22/2021   GFRNONAA 93 05/23/2020   GFRAA 107 05/23/2020   NA 141 06/22/2021   K 4.7 06/22/2021   CALCIUM 9.8 06/22/2021   CO2 26 06/22/2021   GLUCOSE 95 06/22/2021    Lab Results  Component Value Date/Time   HGBA1C 6.3 (H) 06/22/2021 02:43 PM   HGBA1C 6.5 (H) 02/22/2021 09:03 AM   MICROALBUR 30 11/17/2020 12:05 PM   MICROALBUR 80 08/11/2019 10:29 AM    Last diabetic Eye exam:  Lab Results  Component Value Date/Time   HMDIABEYEEXA  Retinopathy (A) 09/14/2019 12:00 AM    Last diabetic Foot exam: No results found for: HMDIABFOOTEX   Lab Results  Component Value Date   CHOL 137 06/22/2021   HDL 60 06/22/2021   LDLCALC 49 06/22/2021   TRIG 168 (H) 06/22/2021   CHOLHDL 2.3 06/22/2021    Hepatic Function Latest Ref Rng & Units 06/22/2021 11/17/2020 08/22/2020  Total Protein 6.0 - 8.5 g/dL 6.6 7.6 7.0  Albumin 3.8 - 4.8 g/dL 4.7 4.9(H) 4.6  AST 0 - 40 IU/L $Remov'22 23 20  'TcWDqC$ ALT 0 - 32 IU/L 30 34(H) 31  Alk Phosphatase 44 - 121 IU/L 88 104 92  Total Bilirubin 0.0 - 1.2 mg/dL 0.3 0.4 0.3    Lab Results  Component Value Date/Time   TSH 1.700 11/16/2019 03:50 PM   TSH 0.797 08/29/2018 10:37 AM    CBC Latest Ref Rng & Units 01/18/2021 11/16/2019 10/18/2018  WBC 3.4 - 10.8 x10E3/uL 3.6 5.8 6.5  Hemoglobin 11.1 - 15.9 g/dL 11.3 11.2 11.7(L)  Hematocrit 34.0 - 46.6 % 35.0 33.9(L) 36.0  Platelets 150 - 450 x10E3/uL 167 213 204    No results found for: VD25OH  Clinical ASCVD: No  The 10-year ASCVD risk score (Arnett DK, et al., 2019) is: 24.5%   Values used to calculate the score:     Age: 72 years     Sex: Female     Is Non-Hispanic African American: Yes     Diabetic:  Yes     Tobacco smoker: Yes     Systolic Blood Pressure: 680 mmHg     Is BP treated: Yes     HDL Cholesterol: 60 mg/dL     Total Cholesterol: 137 mg/dL    Depression screen [facility] 2/9 11/17/2020 04/20/2020 11/16/2019  Decreased Interest 0 0 0  Down, Depressed, Hopeless 0 0 0  PHQ - 2 Score 0 0 0  Altered sleeping - - 0  Tired, decreased energy - - 0  Change in appetite - - 0  Feeling bad or failure about yourself  - - 0  Trouble concentrating - - 0  Moving slowly or fidgety/restless - - 0  Suicidal thoughts - - 0  PHQ-9 Score - - 0  Difficult doing work/chores - - Not difficult at all     Social History   Tobacco Use  Smoking Status Former   Types: Cigarettes   Quit date: 2006   Years since quitting: 17.1  Smokeless Tobacco Never  Tobacco  Comments   quit 10 yrs   BP Readings from Last 3 Encounters:  06/27/21 (!) 115/99  06/22/21 120/68  02/22/21 122/70   Pulse Readings from Last 3 Encounters:  06/27/21 88  06/22/21 69  02/22/21 85   Wt Readings from Last 3 Encounters:  06/22/21 150 lb 6.4 oz (68.2 kg)  02/22/21 149 lb 12.8 oz (67.9 kg)  01/18/21 151 lb 9.6 oz (68.8 kg)   BMI Readings from Last 3 Encounters:  06/22/21 27.33 kg/m  02/22/21 26.54 kg/m  01/18/21 26.85 kg/m    Assessment/Interventions: Review of patient past medical history, allergies, medications, health status, including review of consultants reports, laboratory and other test data, was performed as part of comprehensive evaluation and provision of chronic care management services.   SDOH:  (Social Determinants of Health) assessments and interventions performed: Yes  SDOH Screenings   Alcohol Screen: Not on file  Depression (PHQ2-9): Low Risk    PHQ-2 Score: 0  Financial Resource Strain: Low Risk    Difficulty of Paying Living Expenses: Not hard at all  Food Insecurity: No Food Insecurity   Worried About Charity fundraiser in the Last Year: Never true   Ran Out of Food in the Last Year: Never true  Housing: Not on file  Physical Activity: Sufficiently Active   Days of Exercise per Week: 4 days   Minutes of Exercise per Session: 50 min  Social Connections: Not on file  Stress: No Stress Concern Present   Feeling of Stress : Not at all  Tobacco Use: Medium Risk   Smoking Tobacco Use: Former   Smokeless Tobacco Use: Never   Passive Exposure: Not on file  Transportation Needs: No Transportation Needs   Lack of Transportation (Medical): No   Lack of Transportation (Non-Medical): No    CCM Care Plan  No Known Allergies  Medications Reviewed Today     Reviewed by Mayford Knife, RPH (Pharmacist) on 08/02/21 at 1453  Med List Status: <None>   Medication Order Taking? Sig Documenting Provider Last Dose Status Informant   atorvastatin (LIPITOR) 20 MG tablet [id number] Yes TAKE ONE TABLET BY MOUTH EVERY EVENING with A meal [name], FNP  Active   benzonatate (TESSALON) 100 MG capsule [id number] Yes Take 1 capsule (100 mg total) by mouth 3 (three) times daily as needed for cough. Chase Picket, MD  Active   blood glucose meter kit and supplies KIT [id number] Yes Dispense based on patient and  insurance preference. Use 3 times daily as directed to check blood sugar. Dx code e11.65 [name], FNP  Active   Blood Glucose Monitoring Suppl (ONE TOUCH ULTRA MINI) w/Device KIT [id number] Yes 1 each by Does not apply route 4 (four) times daily -  before meals and at bedtime. [name], FNP  Active   Cholecalciferol (VITAMIN D-3 PO) [id number] Yes Take 1 capsule by mouth daily. 2000 units [provider]  Active Self  Continuous Blood Gluc Receiver (FREESTYLE LIBRE 14 DAY READER) DEVI [id number] Yes USE TO CHECK BLOOD SUGARS FOUR TIMES A DAY DX:E11.65 [name], FNP  Active   Continuous Blood Gluc Sensor (FREESTYLE LIBRE 2 SENSOR) [place] [id number] Yes apply every 14 DAYS [name], FNP  Active   glucagon (GLUCAGEN HYPOKIT) 1 MG SOLR injection [id number] Yes Inject 1 mg into the vein once as needed for up to 1 dose for low blood sugar. [name], FNP  Active   glucose blood (FREESTYLE TEST STRIPS) test strip [id number] Yes Use as instructed [name], FNP  Active   glucose blood (FREESTYLE TEST STRIPS) test strip [id number] Yes Use as instructed [name], FNP  Active   glucose blood test strip [id number] Yes Use as instructed [name], FNP  Active   glucose blood test strip [id number] Yes Use as instructed to check blood sugar 3 times a day. Dx code e11.65 [name], FNP  Active   insulin degludec (TRESIBA FLEXTOUCH) 100 UNIT/ML FlexTouch Pen [id number] Yes Inject 15 Units into the skin at bedtime. [name], FNP  Active   Lancets [facility] ULTRASOFT) lancets [id number] Yes Use as  instructed to check blood sugars 4 times daily E11.9 [name], FNP  Active   lisinopril (ZESTRIL) 10 MG tablet [id number] Yes Take 1 tablet (10 mg total) by mouth daily. [name], FNP  Active   metFORMIN (GLUCOPHAGE XR) 750 MG 24 hr tablet [id number] Yes Take 1 tablet (750 mg total) by mouth daily with breakfast. [name], FNP  Active   Semaglutide,0.25 or 0.5MG /DOS, (OZEMPIC, 0.25 OR 0.5 MG/DOSE,) 2 MG/1.5ML SOPN [id number] Yes Inject 0.5 mg into the skin once a week. [name], FNP  Active             Patient Active Problem List   Diagnosis Date Noted   Hypertensive retinopathy of both eyes 07/06/2020   Type 2 macular telangiectasis of right eye 05/04/2020   Sleep apnea in adult 03/16/2020   Vitreous floaters of left eye 12/29/2019   Other long term (current) drug therapy 11/16/2019   Snores 10/27/2019   Severe nonproliferative diabetic retinopathy of right eye ([place]) 10/06/2019   Severe nonproliferative diabetic retinopathy of left eye ([place]) 10/06/2019   Retinal exudates and deposits 10/06/2019   Other fatigue 11/20/2018   Hypokalemia 10/13/2018   Pyelonephritis 06/17/2018    Immunization History  Administered Date(s) Administered   Fluad Quad(high Dose 65+) 03/22/2020, 06/22/2021   Influenza,inj,Quad PF,6+ Mos 04/18/2018   Influenza-Unspecified 05/01/2019   PFIZER(Purple Top)SARS-COV-2 Vaccination 09/18/2019, 10/14/2019, 04/19/2020, 01/11/2021   Pneumococcal Polysaccharide-23 05/30/2018   Tdap 11/28/2017   Zoster Recombinat (Shingrix) 02/22/2021    Conditions to be addressed/monitored:  Hypertension and Diabetes  This is an addendum to the visit that Ms. Connelley and I had on 07/26/2021. Patient came in today with issues with her Freestyle Libre sensor that is connecting to her APP on her phone.  The phone is displaying an error message that urgent alerts needs to be turned on. She has been  trying to correct this since Thursday. Patient and I did a  troubleshooting of the libre, then called Freestyle to work through reading issues.    Medication Assistance: Application for Ozempic  medication assistance program. in process.  Anticipated assistance start date 07/2021.  See plan of care for additional detail.  Compliance/Adherence/Medication fill history: Care Gaps: Pneumonia Vaccine COVID-19 Vaccine  Shingrix Vaccine   Star-Rating Drugs: Atorvastatin 20 mg tablet  Lisinopril 10 mg tablet  Metformin 750 mg tablet  Ozempic 0.5 mg injection   Patient's preferred pharmacy is:  [facility] [place], [place] - [address] [place] [address] Phone: [phone] Fax: [phone]  [id number] - [place], [place] - [address] [address] Phone: [phone] Fax: [phone]  Uses pill box? Yes Pt endorses 90% compliance  We discussed: Benefits of medication synchronization, packaging and delivery as well as enhanced pharmacist oversight with [facility]. Patient decided to: Continue current medication management strategy  Care Plan and Follow Up Patient Decision:  Patient agrees to Care Plan and Follow-up.  Plan: The patient has been provided with contact information for the care management team and has been advised to call with any health related questions or concerns.   [name], CPP, PharmD Clinical Pharmacist Practitioner [facility] [phone]

## 2021-09-06 ENCOUNTER — Telehealth: Payer: Self-pay

## 2021-09-06 NOTE — Chronic Care Management (AMB) (Addendum)
? ? ?Chronic Care Management ?Pharmacy Assistant  ? ?Name: Shannon [name]  MRN: [id number] DOB: Feb 26, 1956 ? ?Reason for Encounter: Disease State and Medication Review/ General, Medication Coordination ? ?Recent office visits:  ?None ? ?Recent consult visits:  ?None ? ?Hospital visits:  ?Medication Reconciliation was completed by comparing discharge summary, patient?s EMR and Pharmacy list, and upon discussion with patient. ?  ?[facility] on 06/27/2021 due to Acute viral pharyngitis. Discharge date was 06/27/2021.   ?  ?New?Medications Started at [facility] Discharge:?? ?Benzonatate 100 mg Oral 3 times daily PRN ?due to Acute viral pharyngitis ?  ?Medication Changes at Hospital Discharge: None ?  ?Medications Discontinued at Hospital Discharge: None ?  ?Medications that remain the same after Hospital Discharge:??  ?-All other medications will remain the same.   ?  ? ?Medications: ?Outpatient Encounter Medications as of 09/06/2021  ?Medication Sig  ? atorvastatin (LIPITOR) 20 MG tablet TAKE ONE TABLET BY MOUTH EVERY EVENING with A meal  ? benzonatate (TESSALON) 100 MG capsule Take 1 capsule (100 mg total) by mouth 3 (three) times daily as needed for cough.  ? blood glucose meter kit and supplies KIT Dispense based on patient and insurance preference. Use 3 times daily as directed to check blood sugar. Dx code e11.65  ? Blood Glucose Monitoring Suppl (ONE TOUCH ULTRA MINI) w/Device KIT 1 each by Does not apply route 4 (four) times daily -  before meals and at bedtime.  ? Cholecalciferol (VITAMIN D-3 PO) Take 1 capsule by mouth daily. 2000 units  ? Continuous Blood Gluc Receiver (FREESTYLE LIBRE 14 DAY READER) DEVI USE TO CHECK BLOOD SUGARS FOUR TIMES A DAY DX:E11.65  ? Continuous Blood Gluc Sensor (FREESTYLE LIBRE 2 SENSOR) MISC CHANGE sensor every 14 DAYS  ? glucagon (GLUCAGEN HYPOKIT) 1 MG SOLR injection Inject 1 mg into the vein once as needed for up to 1 dose for low blood sugar.  ? glucose blood (FREESTYLE  TEST STRIPS) test strip Use as instructed  ? glucose blood (FREESTYLE TEST STRIPS) test strip Use as instructed  ? glucose blood test strip Use as instructed  ? glucose blood test strip Use as instructed to check blood sugar 3 times a day. Dx code e11.65  ? insulin degludec (TRESIBA FLEXTOUCH) 100 UNIT/ML FlexTouch Pen Inject 15 Units into the skin at bedtime.  ? Lancets (ONETOUCH ULTRASOFT) lancets Use as instructed to check blood sugars 4 times daily E11.9  ? lisinopril (ZESTRIL) 10 MG tablet Take 1 tablet (10 mg total) by mouth daily.  ? metFORMIN (GLUCOPHAGE XR) 750 MG 24 hr tablet Take 1 tablet (750 mg total) by mouth daily with breakfast.  ? Semaglutide,0.25 or 0.5MG/DOS, (OZEMPIC, 0.25 OR 0.5 MG/DOSE,) 2 MG/1.5ML SOPN Inject 0.5 mg into the skin once a week.  ? ?No facility-administered encounter medications on file as of 09/06/2021.  ? ?Contacted Dimas Chyle for general disease state and medication adherence call.  ? ?Patient is not > 5 days past due for refill on the following medications per chart history: ? ?What concerns do you have about your medications? Patient stated no ? ?The patient denies side effects with her medications.  ? ?How often do you forget or accidentally miss a dose? Never ? ?Do you use a pillbox? Patient uses packaging with [facility] ? ?Are you having any problems getting your medications from your pharmacy? No ? ?Has the cost of your medications been a concern? No ?If yes, what medication and is patient assistance available  or has it been applied for? ? ?Since last visit with CPP, no interventions have been made:  ? ?The patient has not had an ED visit since last contact.  ? ?The patient denies problems with their health.  ? ?she denies  concerns or questions for [name], at this time.  ? ?Patient states BG readings are as follows: ? ?Fasting: 03-13 102 ?Before meals: None ?After meals: 03-15 203 (Patient reported eating a Jelly donut) , 03-14 153 ?Bedtime: None ? ?Reviewed  chart for medication changes ahead of medication coordination call. ? ?No OVs, Consults, or hospital visits since last care coordination call/Pharmacist visit.  ? ?No medication changes indicated OR if recent visit, treatment plan here. ? ?BP Readings from Last 3 Encounters:  ?06/27/21 (!) 115/99  ?06/22/21 120/68  ?02/22/21 122/70  ?  ?Lab Results  ?Component Value Date  ? HGBA1C 6.3 (H) 06/22/2021  ?  ? ?Patient obtains medications through Adherence Packaging  30 Days  ? ?Last adherence delivery included:  ?Atorvastatin 20 mg 1 tablet daily (evening meal) ?Metformin 750 mg 1 tablet daily (breakfast) ?Lisinopril 10 mg 1 tablet daily (breakfast) ?Vitamin D3 50 mcg - 1 capsule daily (evening meal) ?Freestyle Libre Kit 2 Sensors- Use as directed every 14 days ? ?Patient declined (meds) last month: ?None ? ?Patient is due for next adherence delivery on: 09-19-2021 ? ?Called patient and reviewed medications and coordinated delivery. ? ?This delivery to include: ?Atorvastatin 20 mg 1 tablet daily (evening meal) ?Metformin 750 mg 1 tablet daily (breakfast) ?Lisinopril 10 mg 1 tablet daily (breakfast) ?Vitamin D3 50 mcg - 1 capsule daily (evening meal) ?Freestyle Libre Kit 2 Sensors- Use as directed every 14 days ?Test strips ?Tresiba 15 units at bed time ? ?No short or acute fill needed ? ?Patient declined the following medications: ?None  ? ?Patient needs refills for: Refills sent to PCP ?Atorvastatin ?Metformin ? ?Confirmed delivery date of 09-19-2021 advised patient that pharmacy will contact them the morning of delivery. ? ?Care Gaps: ?Shingrix overdue ?PNA Vac overdue ?Covid booster overdue last completed 01-11-2021 ?AWV 11-30-2021 ? ?Star Rating Drugs: ?Atorvastatin 20 mg- Last filled 08-18-2021 30 DS [facility] ?Metformin 750 mg- Last filled 02-24--2023 30 DS [facility] ?Lisinopril 10 mg- Last filled 08-18-2021 30 DS [facility] ?Ozempic - Patient assistance ? ?Malecca Hicks CMA ?Clinical Pharmacist  Assistant ?[phone] ? ?

## 2021-09-06 NOTE — Chronic Care Management (AMB) (Incomplete Revision)
? ? ?Chronic Care Management ?Pharmacy Assistant  ? ?Name: Shannon [name]  MRN: [id number] DOB: Feb 26, 1956 ? ?Reason for Encounter: Disease State and Medication Review/ General, Medication Coordination ? ?Recent office visits:  ?None ? ?Recent consult visits:  ?None ? ?Hospital visits:  ?Medication Reconciliation was completed by comparing discharge summary, patient?s EMR and Pharmacy list, and upon discussion with patient. ?  ?[facility] on 06/27/2021 due to Acute viral pharyngitis. Discharge date was 06/27/2021.   ?  ?New?Medications Started at [facility] Discharge:?? ?Benzonatate 100 mg Oral 3 times daily PRN ?due to Acute viral pharyngitis ?  ?Medication Changes at Hospital Discharge: None ?  ?Medications Discontinued at Hospital Discharge: None ?  ?Medications that remain the same after Hospital Discharge:??  ?-All other medications will remain the same.   ?  ? ?Medications: ?Outpatient Encounter Medications as of 09/06/2021  ?Medication Sig  ? atorvastatin (LIPITOR) 20 MG tablet TAKE ONE TABLET BY MOUTH EVERY EVENING with A meal  ? benzonatate (TESSALON) 100 MG capsule Take 1 capsule (100 mg total) by mouth 3 (three) times daily as needed for cough.  ? blood glucose meter kit and supplies KIT Dispense based on patient and insurance preference. Use 3 times daily as directed to check blood sugar. Dx code e11.65  ? Blood Glucose Monitoring Suppl (ONE TOUCH ULTRA MINI) w/Device KIT 1 each by Does not apply route 4 (four) times daily -  before meals and at bedtime.  ? Cholecalciferol (VITAMIN D-3 PO) Take 1 capsule by mouth daily. 2000 units  ? Continuous Blood Gluc Receiver (FREESTYLE LIBRE 14 DAY READER) DEVI USE TO CHECK BLOOD SUGARS FOUR TIMES A DAY DX:E11.65  ? Continuous Blood Gluc Sensor (FREESTYLE LIBRE 2 SENSOR) MISC CHANGE sensor every 14 DAYS  ? glucagon (GLUCAGEN HYPOKIT) 1 MG SOLR injection Inject 1 mg into the vein once as needed for up to 1 dose for low blood sugar.  ? glucose blood (FREESTYLE  TEST STRIPS) test strip Use as instructed  ? glucose blood (FREESTYLE TEST STRIPS) test strip Use as instructed  ? glucose blood test strip Use as instructed  ? glucose blood test strip Use as instructed to check blood sugar 3 times a day. Dx code e11.65  ? insulin degludec (TRESIBA FLEXTOUCH) 100 UNIT/ML FlexTouch Pen Inject 15 Units into the skin at bedtime.  ? Lancets (ONETOUCH ULTRASOFT) lancets Use as instructed to check blood sugars 4 times daily E11.9  ? lisinopril (ZESTRIL) 10 MG tablet Take 1 tablet (10 mg total) by mouth daily.  ? metFORMIN (GLUCOPHAGE XR) 750 MG 24 hr tablet Take 1 tablet (750 mg total) by mouth daily with breakfast.  ? Semaglutide,0.25 or 0.5MG/DOS, (OZEMPIC, 0.25 OR 0.5 MG/DOSE,) 2 MG/1.5ML SOPN Inject 0.5 mg into the skin once a week.  ? ?No facility-administered encounter medications on file as of 09/06/2021.  ? ?Contacted Dimas Chyle for general disease state and medication adherence call.  ? ?Patient is not > 5 days past due for refill on the following medications per chart history: ? ?What concerns do you have about your medications? Patient stated no ? ?The patient denies side effects with her medications.  ? ?How often do you forget or accidentally miss a dose? Never ? ?Do you use a pillbox? Patient uses packaging with [facility] ? ?Are you having any problems getting your medications from your pharmacy? No ? ?Has the cost of your medications been a concern? No ?If yes, what medication and is patient assistance available  or has it been applied for? ? ?Since last visit with CPP, no interventions have been made:  ? ?The patient has not had an ED visit since last contact.  ? ?The patient denies problems with their health.  ? ?she denies  concerns or questions for [name], at this time.  ? ?Patient states BG readings are as follows: ? ?Fasting: 03-13 102 ?Before meals: None ?After meals: 03-15 203 (Patient reported eating a Jelly donut) , 03-14 153 ?Bedtime: None ?Reviewed  chart for medication changes ahead of medication coordination call. ? ?No OVs, Consults, or hospital visits since last care coordination call/Pharmacist visit. (If appropriate, list visit date, provider name) ? ?No medication changes indicated OR if recent visit, treatment plan here. ? ?BP Readings from Last 3 Encounters:  ?06/27/21 (!) 115/99  ?06/22/21 120/68  ?02/22/21 122/70  ?  ?Lab Results  ?Component Value Date  ? HGBA1C 6.3 (H) 06/22/2021  ?  ? ?Patient obtains medications through Adherence Packaging  30 Days  ? ?Last adherence delivery included:  ?Atorvastatin 20 mg 1 tablet daily (evening meal) ?Metformin 750 mg 1 tablet daily (breakfast) ?Lisinopril 10 mg 1 tablet daily (breakfast) ?Vitamin D3 50 mcg - 1 capsule daily (evening meal) ?Freestyle Libre Kit 2 Sensors- Use as directed every 14 days ? ?Patient declined (meds) last month: ?None ? ?Patient is due for next adherence delivery on: 09-19-2021 ? ?Called patient and reviewed medications and coordinated delivery. ? ?This delivery to include: ?Atorvastatin 20 mg 1 tablet daily (evening meal) ?Metformin 750 mg 1 tablet daily (breakfast) ?Lisinopril 10 mg 1 tablet daily (breakfast) ?Vitamin D3 50 mcg - 1 capsule daily (evening meal) ?Freestyle Libre Kit 2 Sensors- Use as directed every 14 days ?Test strips ?Tresiba 15 units at bed time ? ?No short or acute fill needed ? ?Patient declined the following medications: ?None  ? ?Patient needs refills for: ?Atorvastatin ?Metformin ? ?Confirmed delivery date of 09-19-2021 advised patient that pharmacy will contact them the morning of delivery. ? ?Care Gaps: ?Shingrix overdue ?PNA Vac overdue ?Covid booster overdue last completed 01-11-2021 ?AWV 11-30-2021 ? ?Star Rating Drugs: ?Atorvastatin 20 mg- Last filled 08-18-2021 30 DS [facility] ?Metformin 750 mg- Last filled 02-24--2023 30 DS [facility] ?Lisinopril 10 mg- Last filled 08-18-2021 30 DS [facility] ?Ozempic - Patient assistance ? ?Malecca Hicks CMA ?Clinical  Pharmacist Assistant ?[phone] ? ?

## 2021-09-08 ENCOUNTER — Other Ambulatory Visit: Payer: Self-pay

## 2021-09-08 DIAGNOSIS — E119 Type 2 diabetes mellitus without complications: Secondary | ICD-10-CM

## 2021-09-08 MED ORDER — METFORMIN HCL ER 750 MG PO TB24
750.0000 mg | ORAL_TABLET | Freq: Every day | ORAL | 1 refills | Status: DC
Start: 2021-09-08 — End: 2022-03-06

## 2021-09-08 MED ORDER — ATORVASTATIN CALCIUM 20 MG PO TABS: ORAL_TABLET | ORAL | 1 refills | Status: DC

## 2021-09-11 ENCOUNTER — Encounter (INDEPENDENT_AMBULATORY_CARE_PROVIDER_SITE_OTHER): Payer: Self-pay | Admitting: Ophthalmology

## 2021-09-11 ENCOUNTER — Ambulatory Visit (INDEPENDENT_AMBULATORY_CARE_PROVIDER_SITE_OTHER): Payer: HMO | Admitting: Ophthalmology

## 2021-09-11 ENCOUNTER — Other Ambulatory Visit: Payer: Self-pay

## 2021-09-11 DIAGNOSIS — E133491 Other specified diabetes mellitus with severe nonproliferative diabetic retinopathy without macular edema, right eye: Secondary | ICD-10-CM

## 2021-09-11 DIAGNOSIS — H35071 Retinal telangiectasis, right eye: Secondary | ICD-10-CM

## 2021-09-11 DIAGNOSIS — E113311 Type 2 diabetes mellitus with moderate nonproliferative diabetic retinopathy with macular edema, right eye: Secondary | ICD-10-CM

## 2021-09-11 DIAGNOSIS — G4730 Sleep apnea, unspecified: Secondary | ICD-10-CM

## 2021-09-11 DIAGNOSIS — E113412 Type 2 diabetes mellitus with severe nonproliferative diabetic retinopathy with macular edema, left eye: Secondary | ICD-10-CM

## 2021-09-11 MED ORDER — BEVACIZUMAB 2.5 MG/0.1ML IZ SOSY
2.5000 mg | PREFILLED_SYRINGE | INTRAVITREAL | Status: AC | PRN
  Administered 2021-09-11: 2.5 mg via INTRAVITREAL

## 2021-09-11 NOTE — Progress Notes (Signed)
? ? ?09/11/2021 ? ?  ? ?CHIEF COMPLAINT ?Patient presents for  ?Chief Complaint  ?Patient presents with  ? Diabetic Retinopathy with Macular Edema  ? ? ? ? ?HISTORY OF PRESENT ILLNESS: ?Shannon [name] is a 66 y.o. female who presents to the clinic today for:  ? ?HPI   ?4 mos for dilate OU, OCT. ?PT states no changes in vision. ?Pt had covid back in Jan. ?Pt denies floaters and FOL. ?  ?Last edited by Silvestre Moment on 09/11/2021  8:27 AM.  ?  ? ? ?Referring physician: ?[name], [facility] ?[address] ?[address] ?[place],  [place] [address] ? ?HISTORICAL INFORMATION:  ? ?Selected notes from the [facility] ?  ? ?Lab Results  ?Component Value Date  ? HGBA1C 6.3 (H) 06/22/2021  ?  ? ?CURRENT MEDICATIONS: ?No current outpatient medications on file. (Ophthalmic Drugs)  ? ?No current facility-administered medications for this visit. (Ophthalmic Drugs)  ? ?Current Outpatient Medications (Other)  ?Medication Sig  ? atorvastatin (LIPITOR) 20 MG tablet TAKE ONE TABLET BY MOUTH EVERY EVENING with A meal  ? benzonatate (TESSALON) 100 MG capsule Take 1 capsule (100 mg total) by mouth 3 (three) times daily as needed for cough.  ? blood glucose meter kit and supplies KIT Dispense based on patient and insurance preference. Use 3 times daily as directed to check blood sugar. Dx code e11.65  ? Blood Glucose Monitoring Suppl (ONE TOUCH ULTRA MINI) w/Device KIT 1 each by Does not apply route 4 (four) times daily -  before meals and at bedtime.  ? Cholecalciferol (VITAMIN D-3 PO) Take 1 capsule by mouth daily. 2000 units  ? Continuous Blood Gluc Receiver (FREESTYLE LIBRE 14 DAY READER) DEVI USE TO CHECK BLOOD SUGARS FOUR TIMES A DAY DX:E11.65  ? Continuous Blood Gluc Sensor (FREESTYLE LIBRE 2 SENSOR) MISC CHANGE sensor every 14 DAYS  ? glucagon (GLUCAGEN HYPOKIT) 1 MG SOLR injection Inject 1 mg into the vein once as needed for up to 1 dose for low blood sugar.  ? glucose blood (FREESTYLE TEST STRIPS) test strip Use as instructed  ? glucose  blood (FREESTYLE TEST STRIPS) test strip Use as instructed  ? glucose blood test strip Use as instructed  ? glucose blood test strip Use as instructed to check blood sugar 3 times a day. Dx code e11.65  ? insulin degludec (TRESIBA FLEXTOUCH) 100 UNIT/ML FlexTouch Pen Inject 15 Units into the skin at bedtime.  ? Lancets (ONETOUCH ULTRASOFT) lancets Use as instructed to check blood sugars 4 times daily E11.9  ? lisinopril (ZESTRIL) 10 MG tablet Take 1 tablet (10 mg total) by mouth daily.  ? metFORMIN (GLUCOPHAGE XR) 750 MG 24 hr tablet Take 1 tablet (750 mg total) by mouth daily with breakfast.  ? Semaglutide,0.25 or 0.5MG/DOS, (OZEMPIC, 0.25 OR 0.5 MG/DOSE,) 2 MG/1.5ML SOPN Inject 0.5 mg into the skin once a week.  ? ?No current facility-administered medications for this visit. (Other)  ? ? ? ? ?REVIEW OF SYSTEMS: ?ROS   ?Negative for: Constitutional, Gastrointestinal, Neurological, Skin, Genitourinary, Musculoskeletal, HENT, Endocrine, Cardiovascular, Eyes, Respiratory, Psychiatric, Allergic/Imm, Heme/Lymph ?Last edited by Silvestre Moment on 09/11/2021  8:27 AM.  ?  ? ? ? ?ALLERGIES ?No Known Allergies ? ?PAST MEDICAL HISTORY ?Past Medical History:  ?Diagnosis Date  ? COVID-19 virus infection 10/13/2018  ? Diabetes mellitus without complication ([place])   ? Hypercholesteremia   ? Sleep apnea   ? Newly diagnosed in September 2021  ? ?Past Surgical History:  ?Procedure  Laterality Date  ? ABDOMINAL HYSTERECTOMY    ? CATARACT EXTRACTION Bilateral   ? CESAREAN SECTION    ? MYOMECTOMY    ? ? ?FAMILY HISTORY ?Family History  ?Problem Relation Age of Onset  ? Hypertension Mother   ? Diabetes Mother   ? Stroke Mother   ? Diabetes Father   ? Hypertension Father   ? Stroke Father   ? Hypertension Son   ? Diabetes Son   ? Breast cancer Neg Hx   ? ? ?SOCIAL HISTORY ?Social History  ? ?Tobacco Use  ? Smoking status: Former  ?  Types: Cigarettes  ?  Quit date: 2006  ?  Years since quitting: 17.2  ? Smokeless tobacco: Never  ? Tobacco  comments:  ?  quit 10 yrs  ?Vaping Use  ? Vaping Use: Never used  ?Substance Use Topics  ? Alcohol use: Yes  ?  Comment: 1 glass of wine sometimes daily   ? Drug use: Never  ? ?  ? ?  ? ?OPHTHALMIC EXAM: ? ?Base Eye Exam   ? ? Visual Acuity (ETDRS)   ? ?   Right Left  ? Dist [place] 20/20 -1 20/20  ? ?  ?  ? ? Tonometry (Tonopen, 8:32 AM)   ? ?   Right Left  ? Pressure 15 10  ? ?  ?  ? ? Pupils   ? ?   Dark Light Shape React APD  ? Right 2 2 Round Slow None  ? Left 2 2 Round Slow None  ? ?  ?  ? ? Visual Fields   ? ?   Left Right  ?  Full Full  ? ?  ?  ? ? Extraocular Movement   ? ?   Right Left  ?  Full Full  ? ?  ?  ? ? Neuro/Psych   ? ? Oriented x3: Yes  ? Mood/Affect: Normal  ? ?  ?  ? ? Dilation   ? ? Both eyes: 1.0% Mydriacyl, 2.5% Phenylephrine @ 8:32 AM  ? ?  ?  ? ?  ? ?Slit Lamp and Fundus Exam   ? ? External Exam   ? ?   Right Left  ? External Normal Normal  ? ?  ?  ? ? Slit Lamp Exam   ? ?   Right Left  ? Lids/Lashes Normal Normal  ? Conjunctiva/Sclera White and quiet White and quiet  ? Cornea Clear Clear  ? Anterior Chamber Deep and quiet Deep and quiet  ? Iris Round and reactive Round and reactive  ? Lens Posterior chamber intraocular lens Posterior chamber intraocular lens  ? Anterior Vitreous Normal Normal  ? ?  ?  ? ? Fundus Exam   ? ?   Right Left  ? Posterior Vitreous Normal Normal, small vitreous debris, no cells, no overt PVD  ? Disc Normal Normal, posterior pole cotton-wool spots noted  ? C/D Ratio 0.2 0.2  ? Macula  Exudates, Macular thickening, Mild clinically significant macular edema Microaneurysms, no detectable clinically significant macular edema  ? Vessels NPDR-Severe NPDR-Severe  ? Periphery Normal Normal  ? ?  ?  ? ?  ? ? ?IMAGING AND PROCEDURES  ?Imaging and Procedures for 09/11/21 ? ?OCT, Retina - OU - Both Eyes   ? ?   ?Right Eye ?Quality was good. Scan locations included subfoveal. Central Foveal Thickness: 281. Progression has improved. Findings include abnormal foveal contour.   ? ?Left Eye ?Quality  was good. Scan locations included subfoveal. Central Foveal Thickness: 356. Progression has been stable. Findings include abnormal foveal contour.  ? ?Notes ?CSME OD, now some 3 months status post most recent injection Avastin concomitantly patient is using CPAP effectively and compliantly thus less nightly macular hypoxia ? ? ?OS with slight increase in thickening inferior portion of the FAZ extending into the center involvement.  Will need repeat and resume injection Avastin OS only today ? ?  ? ?Intravitreal Injection, Pharmacologic Agent - OS - Left Eye   ? ?   ?Time Out ?09/11/2021. 9:08 AM. Confirmed correct patient, procedure, site, and patient consented.  ? ?Anesthesia ?Topical anesthesia was used. Anesthetic medications included Lidocaine 4%.  ? ?Procedure ?Preparation included 10% betadine to eyelids, Tobramycin 0.3%, 5% betadine to ocular surface. A 30 gauge needle was used.  ? ?Injection: ?2.5 mg bevacizumab 2.5 MG/0.1ML ?  Route: Intravitreal, Site: Left Eye ?  [place]: [id number], Lot: [id number]  ? ?Post-op ?Post injection exam found visual acuity of at least counting fingers. The patient tolerated the procedure well. There were no complications. The patient received written and verbal post procedure care education. Post injection medications were not given.  ? ?  ? ? ?  ?  ? ?  ?ASSESSMENT/PLAN: ? ?Sleep apnea in adult ?Patient reports excellent compliance with CPAP usage, feels well improved sense of wellbeing ? ?Severe nonproliferative diabetic retinopathy of left eye, with macular edema, associated with type 2 diabetes mellitus ([place]) ?Increased center involved CSME OS, will need to resume therapy OS only.  Repeat Avastin today and examination next in 6 weeks ? ?Severe nonproliferative diabetic retinopathy of right eye ([place]) ?OD stable over time, will continue to monitor ? ?Type 2 macular telangiectasis of right eye ?Component of small microvascular disease temporal aspect of the  FAZ, not progressing as patient is compliant with CPAP ? ?Superimposed diabetic retinopathy present however  ? ?  ICD-10-CM   ?1. Severe nonproliferative diabetic retinopathy of left eye, with macular edema, associate

## 2021-09-11 NOTE — Assessment & Plan Note (Signed)
Component of small microvascular disease temporal aspect of the FAZ, not progressing as patient is compliant with CPAP ? ?Superimposed diabetic retinopathy present however ?

## 2021-09-11 NOTE — Assessment & Plan Note (Signed)
Patient reports excellent compliance with CPAP usage, feels well improved sense of wellbeing ?

## 2021-09-11 NOTE — Assessment & Plan Note (Signed)
OD stable over time, will continue to monitor ?

## 2021-09-11 NOTE — Assessment & Plan Note (Signed)
Increased center involved CSME OS, will need to resume therapy OS only.  Repeat Avastin today and examination next in 6 weeks ?

## 2021-09-13 ENCOUNTER — Other Ambulatory Visit: Payer: Self-pay | Admitting: Nurse Practitioner

## 2021-09-13 DIAGNOSIS — E119 Type 2 diabetes mellitus without complications: Secondary | ICD-10-CM

## 2021-09-14 DIAGNOSIS — G4733 Obstructive sleep apnea (adult) (pediatric): Secondary | ICD-10-CM | POA: Diagnosis not present

## 2021-09-28 ENCOUNTER — Ambulatory Visit (INDEPENDENT_AMBULATORY_CARE_PROVIDER_SITE_OTHER): Payer: HMO | Admitting: Nurse Practitioner

## 2021-09-28 ENCOUNTER — Encounter: Payer: Self-pay | Admitting: Nurse Practitioner

## 2021-09-28 VITALS — BP 118/62 | HR 77 | Temp 98.3°F | Ht 62.2 in | Wt 151.0 lb

## 2021-09-28 DIAGNOSIS — I700 Atherosclerosis of aorta: Secondary | ICD-10-CM | POA: Diagnosis not present

## 2021-09-28 DIAGNOSIS — E113493 Type 2 diabetes mellitus with severe nonproliferative diabetic retinopathy without macular edema, bilateral: Secondary | ICD-10-CM

## 2021-09-28 DIAGNOSIS — E782 Mixed hyperlipidemia: Secondary | ICD-10-CM

## 2021-09-28 DIAGNOSIS — Z794 Long term (current) use of insulin: Secondary | ICD-10-CM

## 2021-09-28 DIAGNOSIS — R200 Anesthesia of skin: Secondary | ICD-10-CM | POA: Diagnosis not present

## 2021-09-28 DIAGNOSIS — R202 Paresthesia of skin: Secondary | ICD-10-CM | POA: Diagnosis not present

## 2021-09-28 DIAGNOSIS — Z23 Encounter for immunization: Secondary | ICD-10-CM

## 2021-09-28 NOTE — Patient Instructions (Signed)

## 2021-09-28 NOTE — Progress Notes (Signed)
?Industrial/product designer as a Education administrator for [employer], FNP.,have documented all relevant documentation on the behalf of [name], FNP,as directed by  [name], FNP while in the presence of [name], [place]. ? ?This visit occurred during the SARS-CoV-2 public health emergency.  Safety protocols were in place, including screening questions prior to the visit, additional usage of staff PPE, and extensive cleaning of exam room while observing appropriate contact time as indicated for disinfecting solutions. ? ?Subjective:  ?  ? Patient ID: Shannon [name] , female    DOB: [birth year] , 66 y.o.   MRN: [id number] ? ? ?Chief Complaint  ?Patient presents with  ? Hypertension  ? Diabetes  ? ? ?HPI ? ?Pt is here today for BP & DM f/u. She is taking 15 units of tresiba.  ? ?Wt Readings from Last 3 Encounters: ?09/28/21 : 151 lb (68.5 kg) ?06/22/21 : 150 lb 6.4 oz (68.2 kg) ?02/22/21 : 149 lb 12.8 oz (67.9 kg) ? ? ? ?Diabetes ?She presents for her follow-up diabetic visit. She has type 2 diabetes mellitus. Her disease course has been stable. Pertinent negatives for hypoglycemia include no confusion, dizziness or nervousness/anxiousness. Pertinent negatives for diabetes include no fatigue, no polydipsia, no polyphagia and no polyuria. There are no hypoglycemic complications. Symptoms are stable. There are no diabetic complications. Risk factors for coronary artery disease include diabetes mellitus, hypertension and obesity. Current diabetic treatment includes oral agent (dual therapy). She is compliant with treatment all of the time (she has only been taking 0.25 mg weekly, trying to spread out her medication when she did not have insurance). She is following a generally healthy diet. When asked about meal planning, she reported none. She has not had a previous visit with a dietitian. She participates in exercise every other day. Her home blood glucose trend is increasing steadily. Her overall blood glucose range is  140-180 mg/dl. (She wears the [place].   ?Blood sugar had one high of 175, she has been having several low readings 61-69. ) An ACE inhibitor/angiotensin II receptor blocker is being taken. She does not see a podiatrist.Eye exam current: not done at this time, needs a new opthamologist.   ? ?Past Medical History:  ?Diagnosis Date  ? COVID-19 virus infection 10/13/2018  ? Diabetes mellitus without complication ([place])   ? Hypercholesteremia   ? Sleep apnea   ? Newly diagnosed in September 2021  ?  ? ?Family History  ?Problem Relation Age of Onset  ? Hypertension Mother   ? Diabetes Mother   ? Stroke Mother   ? Diabetes Father   ? Hypertension Father   ? Stroke Father   ? Hypertension Son   ? Diabetes Son   ? Breast cancer Neg Hx   ? ? ? ?Current Outpatient Medications:  ?  atorvastatin (LIPITOR) 20 MG tablet, TAKE ONE TABLET BY MOUTH EVERY EVENING with A meal, Disp: 90 tablet, Rfl: 1 ?  blood glucose meter kit and supplies KIT, Dispense based on patient and insurance preference. Use 3 times daily as directed to check blood sugar. Dx code e11.65, Disp: 1 each, Rfl: 9 ?  Blood Glucose Monitoring Suppl (ONE TOUCH ULTRA MINI) w/Device KIT, 1 each by Does not apply route 4 (four) times daily -  before meals and at bedtime., Disp: 1 kit, Rfl: 0 ?  Cholecalciferol (VITAMIN D-3 PO), Take 1 capsule by mouth daily. 2000 units, Disp: , Rfl:  ?  Continuous Blood Gluc Receiver (FREESTYLE LIBRE 14 DAY READER)  DEVI, USE TO CHECK BLOOD SUGARS FOUR TIMES A DAY DX:E11.65, Disp: 1 each, Rfl: 1 ?  glucagon (GLUCAGEN HYPOKIT) 1 MG SOLR injection, Inject 1 mg into the vein once as needed for up to 1 dose for low blood sugar., Disp: 1 each, Rfl: 5 ?  glucose blood (FREESTYLE TEST STRIPS) test strip, Use as instructed, Disp: 100 each, Rfl: 12 ?  glucose blood (FREESTYLE TEST STRIPS) test strip, Use as instructed, Disp: 100 each, Rfl: 12 ?  glucose blood test strip, Use as instructed, Disp: 100 each, Rfl: 5 ?  glucose blood test strip, Use as  instructed to check blood sugar 3 times a day. Dx code e11.65, Disp: 100 each, Rfl: 12 ?  insulin degludec (TRESIBA FLEXTOUCH) 100 UNIT/ML FlexTouch Pen, Inject 15 Units into the skin at bedtime., Disp: 15 mL, Rfl: 1 ?  Lancets (ONETOUCH ULTRASOFT) lancets, Use as instructed to check blood sugars 4 times daily E11.9, Disp: 300 each, Rfl: 3 ?  lisinopril (ZESTRIL) 10 MG tablet, TAKE ONE TABLET BY MOUTH EVERY MORNING, Disp: 90 tablet, Rfl: 0 ?  metFORMIN (GLUCOPHAGE XR) 750 MG 24 hr tablet, Take 1 tablet (750 mg total) by mouth daily with breakfast., Disp: 90 tablet, Rfl: 1 ?  Semaglutide,0.25 or 0.5MG/DOS, (OZEMPIC, 0.25 OR 0.5 MG/DOSE,) 2 MG/1.5ML SOPN, Inject 0.5 mg into the skin once a week., Disp: 4.5 mL, Rfl: 1 ?  Continuous Blood Gluc Sensor (FREESTYLE LIBRE 2 SENSOR) MISC, CHANGE sensor every 14 DAYS, Disp: 2 each, Rfl: 1  ? ?No Known Allergies  ? ?Review of Systems  ?Constitutional: Negative.  Negative for fatigue.  ?Respiratory: Negative.    ?Cardiovascular: Negative.   ?Gastrointestinal: Negative.   ?Endocrine: Negative for polydipsia, polyphagia and polyuria.  ?Neurological:  Positive for numbness (bilateral feet). Negative for dizziness.  ?Psychiatric/Behavioral:  Negative for confusion. The patient is not nervous/anxious.    ? ?Today's Vitals  ? 09/28/21 1136  ?BP: 118/62  ?Pulse: 77  ?Temp: 98.3 ?F (36.8 ?C)  ?TempSrc: Oral  ?Weight: 151 lb (68.5 kg)  ?Height: 5' 2.2" (1.58 m)  ? ?Body mass index is 27.44 kg/m?.  ?Wt Readings from Last 3 Encounters:  ?09/28/21 151 lb (68.5 kg)  ?06/22/21 150 lb 6.4 oz (68.2 kg)  ?02/22/21 149 lb 12.8 oz (67.9 kg)  ? ? ?Objective:  ?Physical Exam ?Vitals reviewed.  ?Constitutional:   ?   General: She is not in acute distress. ?   Appearance: Normal appearance. She is well-developed.  ?Cardiovascular:  ?   Rate and Rhythm: Normal rate and regular rhythm.  ?   Pulses: Normal pulses.  ?   Heart sounds: Normal heart sounds. No murmur heard. ?Pulmonary:  ?   Effort: Pulmonary  effort is normal. No respiratory distress.  ?   Breath sounds: Normal breath sounds. No wheezing.  ?Chest:  ?   Chest wall: No tenderness.  ?Musculoskeletal:     ?   General: Normal range of motion.  ?Skin: ?   General: Skin is warm and dry.  ?   Capillary Refill: Capillary refill takes less than 2 seconds.  ?Neurological:  ?   General: No focal deficit present.  ?   Mental Status: She is alert and oriented to person, place, and time.  ?Psychiatric:     ?   Mood and Affect: Mood normal.     ?   Behavior: Behavior normal.     ?   Thought Content: Thought content normal.     ?  Judgment: Judgment normal.  ?  ? ?   ?Assessment And Plan:  ?   ?1. Type 2 diabetes mellitus with both eyes affected by severe nonproliferative retinopathy without macular edema, with long-term current use of insulin ([place]) ?Comments: HgbA1c is improving, continue current medications, she is doing well with Ozempic and receives with patient assistance. ?- Hemoglobin A1c ? ?2. Aortic atherosclerosis ([place]) ?Comments: Continue statin, tolerating well.  ?- BMP8+EGFR ? ?3. Mixed hyperlipidemia ?Comments: Stable, continue  ?- Lipid panel ? ?4. Numbness and tingling ?Comments: Reports to bilateral feet. Will check for metabolic causes even though she has a history of diabetes. ?- Vitamin B12 ?- TSH ?- Methylmalonic Acid ? ?5. Need for pneumococcal 20-valent conjugate vaccination ?- Pneumococcal conjugate vaccine 20-valent (Prevnar 20) ?  ? ? ?Patient was given opportunity to ask questions. Patient verbalized understanding of the plan and was able to repeat key elements of the plan. All questions were answered to their satisfaction.  ?[name], FNP  ? ?I, [name], FNP, have reviewed all documentation for this visit. The documentation on 09/28/21 for the exam, diagnosis, procedures, and orders are all accurate and complete.  ? ?IF YOU HAVE BEEN REFERRED TO A SPECIALIST, IT MAY TAKE 1-2 WEEKS TO SCHEDULE/PROCESS THE REFERRAL. IF YOU HAVE NOT  HEARD FROM US/SPECIALIST IN TWO WEEKS, PLEASE GIVE [place] A CALL AT [phone] X [phone].  ? ?THE PATIENT IS ENCOURAGED TO PRACTICE SOCIAL DISTANCING DUE TO THE COVID-19 PANDEMIC.   ?

## 2021-10-01 LAB — BMP8+EGFR
BUN/Creatinine Ratio: 34 — ABNORMAL HIGH (ref 12–28)
BUN: 18 mg/dL (ref 8–27)
CO2: 26 mmol/L (ref 20–29)
Calcium: 9.5 mg/dL (ref 8.7–10.3)
Chloride: 102 mmol/L (ref 96–106)
Creatinine, Ser: 0.53 mg/dL — ABNORMAL LOW (ref 0.57–1.00)
Glucose: 98 mg/dL (ref 70–99)
Potassium: 4.4 mmol/L (ref 3.5–5.2)
Sodium: 142 mmol/L (ref 134–144)
eGFR: 101 mL/min/{1.73_m2} (ref >59)

## 2021-10-01 LAB — LIPID PANEL
Chol/HDL Ratio: 1.9 ratio (ref 0.0–4.4)
Cholesterol, Total: 109 mg/dL (ref 100–199)
HDL: 58 mg/dL (ref >39)
LDL Chol Calc (NIH): 34 mg/dL (ref 0–99)
Triglycerides: 88 mg/dL (ref 0–149)
VLDL Cholesterol Cal: 17 mg/dL (ref 5–40)

## 2021-10-01 LAB — HEMOGLOBIN A1C
Est. average glucose Bld gHb Est-mCnc: 148 mg/dL
Hgb A1c MFr Bld: 6.8 % — ABNORMAL HIGH (ref 4.8–5.6)

## 2021-10-01 LAB — TSH: TSH: 0.587 u[IU]/mL (ref 0.450–4.500)

## 2021-10-01 LAB — METHYLMALONIC ACID, SERUM: Methylmalonic Acid: 77 nmol/L (ref 0–378)

## 2021-10-01 LAB — VITAMIN B12: Vitamin B-12: 1789 pg/mL — ABNORMAL HIGH (ref 232–1245)

## 2021-10-06 ENCOUNTER — Telehealth: Payer: Self-pay

## 2021-10-06 NOTE — Chronic Care Management (AMB) (Addendum)
? ? ?Chronic Care Management ?Pharmacy Assistant  ? ?Name: Shannon [name]  MRN: [id number] DOB: 12-10-1955 ? ? ?Reason for Encounter: Medication Review/ Medication coordination ?  ? ?Recent office visits:  ?09-28-2021 [name], [place]. Creatinine= 0.53, BUN/Creatinine= 34. A1C= 6.8. B12= 1,789. STOP Tessalon. ? ?Recent consult visits:  ?09-11-2021 [name], MD (Ophthalmology). Bevacizumab injection given in both eyes. ? ?Hospital visits:  ?Medication Reconciliation was completed by comparing discharge summary, patient?s EMR and Pharmacy list, and upon discussion with patient. ?  ?[facility] on 06/27/2021 due to Acute viral pharyngitis. Discharge date was 06/27/2021.   ?  ?New?Medications Started at [facility] Discharge:?? ?Benzonatate 100 mg Oral 3 times daily PRN ?due to Acute viral pharyngitis ?  ?Medication Changes at Hospital Discharge: None ?  ?Medications Discontinued at Hospital Discharge: None ?  ?Medications that remain the same after Hospital Discharge:??  ?-All other medications will remain the same.   ? ?Medications: ?Outpatient Encounter Medications as of 10/06/2021  ?Medication Sig  ? atorvastatin (LIPITOR) 20 MG tablet TAKE ONE TABLET BY MOUTH EVERY EVENING with A meal  ? blood glucose meter kit and supplies KIT Dispense based on patient and insurance preference. Use 3 times daily as directed to check blood sugar. Dx code e11.65  ? Blood Glucose Monitoring Suppl (ONE TOUCH ULTRA MINI) w/Device KIT 1 each by Does not apply route 4 (four) times daily -  before meals and at bedtime.  ? Cholecalciferol (VITAMIN D-3 PO) Take 1 capsule by mouth daily. 2000 units  ? Continuous Blood Gluc Receiver (FREESTYLE LIBRE 14 DAY READER) DEVI USE TO CHECK BLOOD SUGARS FOUR TIMES A DAY DX:E11.65  ? Continuous Blood Gluc Sensor (FREESTYLE LIBRE 2 SENSOR) MISC CHANGE sensor every 14 DAYS  ? glucagon (GLUCAGEN HYPOKIT) 1 MG SOLR injection Inject 1 mg into the vein once as needed for up to 1 dose for low blood  sugar.  ? glucose blood (FREESTYLE TEST STRIPS) test strip Use as instructed  ? glucose blood (FREESTYLE TEST STRIPS) test strip Use as instructed  ? glucose blood test strip Use as instructed  ? glucose blood test strip Use as instructed to check blood sugar 3 times a day. Dx code e11.65  ? insulin degludec (TRESIBA FLEXTOUCH) 100 UNIT/ML FlexTouch Pen Inject 15 Units into the skin at bedtime.  ? Lancets (ONETOUCH ULTRASOFT) lancets Use as instructed to check blood sugars 4 times daily E11.9  ? lisinopril (ZESTRIL) 10 MG tablet TAKE ONE TABLET BY MOUTH EVERY MORNING  ? metFORMIN (GLUCOPHAGE XR) 750 MG 24 hr tablet Take 1 tablet (750 mg total) by mouth daily with breakfast.  ? Semaglutide,0.25 or 0.5MG/DOS, (OZEMPIC, 0.25 OR 0.5 MG/DOSE,) 2 MG/1.5ML SOPN Inject 0.5 mg into the skin once a week.  ? ?No facility-administered encounter medications on file as of 10/06/2021.  ? ?Reviewed chart for medication changes ahead of medication coordination call. ? ?No medication changes indicated OR if recent visit, treatment plan here. ? ?BP Readings from Last 3 Encounters:  ?09/28/21 118/62  ?06/27/21 (!) 115/99  ?06/22/21 120/68  ?  ?Lab Results  ?Component Value Date  ? HGBA1C 6.8 (H) 09/28/2021  ?  ? ?Patient obtains medications through Adherence Packaging  30 Days  ? ?Last adherence delivery included:  ?Atorvastatin 20 mg 1 tablet daily (evening meal) ?Metformin 750 mg 1 tablet daily (breakfast) ?Lisinopril 10 mg 1 tablet daily (breakfast) ?Vitamin D3 50 mcg - 1 capsule daily (evening meal) ?Freestyle Libre Kit 2 Sensors- Use as  directed every 14 days ?Test strips ?Tresiba 15 units at bed time ? ?Patient declined (meds) last month: ?None ? ?Patient is due for next adherence delivery on: 10-18-2021 ? ?Called patient and reviewed medications and coordinated delivery. ? ?This delivery to include: ?Tresiba 15 units at bed time ?Lisinopril 10 mg 1 tablet daily (breakfast) ?Vitamin D3 50 mcg - 1 capsule daily (evening  meal) ?Atorvastatin 20 mg 1 tablet daily (evening meal) ?Metformin 750 mg 1 tablet daily (breakfast) ?Freestyle Libre Kit 2 Sensors- Use as directed every 14 days ? ?No short/acute fill needed ? ?Patient declined the following medications: ?Test strips- Plenty supply ? ?Patient needs refills for: Refill request sent ?Freestyle libre sensor ? ?Confirmed delivery date of 10-18-2021, advised patient that pharmacy will contact them the morning of delivery. ? ?10-20-2021: Completed no fill report for [facility] sensor. Last filled on 10-18-2021. Updated no refill report. ? ?Care Gaps: ?Covid booster overdue last completed 01-11-2021 ?AWV 11-30-2021 ? ?Star Rating Drugs: ?Atorvastatin 20 mg- Last filled 09-13-2021 30 [facility] ?Metformin 750 mg- Last filled 09-13-2021 [facility] ?Lisinopril 10 mg- Last filled 09-14-2021 [facility] ?Ozempic - Patient assistance ? ?Malecca Hicks CMA ?Clinical Pharmacist Assistant ?[phone] ? ?

## 2021-10-09 ENCOUNTER — Other Ambulatory Visit: Payer: Self-pay

## 2021-10-09 MED ORDER — FREESTYLE LIBRE 2 SENSOR MISC
1 refills | Status: DC
Start: 2021-10-09 — End: 2021-12-06

## 2021-10-23 ENCOUNTER — Encounter (INDEPENDENT_AMBULATORY_CARE_PROVIDER_SITE_OTHER): Payer: HMO | Admitting: Ophthalmology

## 2021-10-25 ENCOUNTER — Encounter (INDEPENDENT_AMBULATORY_CARE_PROVIDER_SITE_OTHER): Payer: HMO | Admitting: Ophthalmology

## 2021-10-25 ENCOUNTER — Ambulatory Visit (INDEPENDENT_AMBULATORY_CARE_PROVIDER_SITE_OTHER): Payer: HMO | Admitting: Ophthalmology

## 2021-10-25 ENCOUNTER — Encounter (INDEPENDENT_AMBULATORY_CARE_PROVIDER_SITE_OTHER): Payer: Self-pay | Admitting: Ophthalmology

## 2021-10-25 DIAGNOSIS — G4730 Sleep apnea, unspecified: Secondary | ICD-10-CM | POA: Diagnosis not present

## 2021-10-25 DIAGNOSIS — H35071 Retinal telangiectasis, right eye: Secondary | ICD-10-CM | POA: Diagnosis not present

## 2021-10-25 DIAGNOSIS — E113412 Type 2 diabetes mellitus with severe nonproliferative diabetic retinopathy with macular edema, left eye: Secondary | ICD-10-CM | POA: Diagnosis not present

## 2021-10-25 MED ORDER — BEVACIZUMAB 2.5 MG/0.1ML IZ SOSY
2.5000 mg | PREFILLED_SYRINGE | INTRAVITREAL | Status: AC | PRN
  Administered 2021-10-25: 2.5 mg via INTRAVITREAL

## 2021-10-25 NOTE — Assessment & Plan Note (Signed)
OS much improved post Avastin injection, 6 weeks prior ?

## 2021-10-25 NOTE — Assessment & Plan Note (Signed)
Improved macular findings OD, some 5 to 6 weeks most recent evaluation of the left eye. ? ?OD may be also benefiting from continued use of CPAP use, as last injection antivegF OD was 8 months prior ?

## 2021-10-25 NOTE — Progress Notes (Signed)
? ? ?10/25/2021 ? ?  ? ?CHIEF COMPLAINT ?Patient presents for  ?Chief Complaint  ?Patient presents with  ? Diabetic Retinopathy with Macular Edema  ? ? ? ? ?HISTORY OF PRESENT ILLNESS: ?Shannon [name] is a 65 y.o. female who presents to the clinic today for:  ? ?HPI   ?6 weeks for dilate, OS AVASTIN OCT. ?Pt stated no changes in vision. ?Pt denies any new floaters and FOL. ? ?Last edited by Silvestre Moment on 10/25/2021  8:32 AM.  ?  ? ? ?Referring physician: ?[name], [facility] ?[address] ?[address] ?[place],  [place] [address] ? ?HISTORICAL INFORMATION:  ? ?Selected notes from the [facility] ?  ? ?Lab Results  ?Component Value Date  ? HGBA1C 6.8 (H) 09/28/2021  ?  ? ?CURRENT MEDICATIONS: ?No current outpatient medications on file. (Ophthalmic Drugs)  ? ?No current facility-administered medications for this visit. (Ophthalmic Drugs)  ? ?Current Outpatient Medications (Other)  ?Medication Sig  ? atorvastatin (LIPITOR) 20 MG tablet TAKE ONE TABLET BY MOUTH EVERY EVENING with A meal  ? blood glucose meter kit and supplies KIT Dispense based on patient and insurance preference. Use 3 times daily as directed to check blood sugar. Dx code e11.65  ? Blood Glucose Monitoring Suppl (ONE TOUCH ULTRA MINI) w/Device KIT 1 each by Does not apply route 4 (four) times daily -  before meals and at bedtime.  ? Cholecalciferol (VITAMIN D-3 PO) Take 1 capsule by mouth daily. 2000 units  ? Continuous Blood Gluc Receiver (FREESTYLE LIBRE 14 DAY READER) DEVI USE TO CHECK BLOOD SUGARS FOUR TIMES A DAY DX:E11.65  ? Continuous Blood Gluc Sensor (FREESTYLE LIBRE 2 SENSOR) MISC CHANGE sensor every 14 DAYS  ? glucagon (GLUCAGEN HYPOKIT) 1 MG SOLR injection Inject 1 mg into the vein once as needed for up to 1 dose for low blood sugar.  ? glucose blood (FREESTYLE TEST STRIPS) test strip Use as instructed  ? glucose blood (FREESTYLE TEST STRIPS) test strip Use as instructed  ? glucose blood test strip Use as instructed  ? glucose blood test strip  Use as instructed to check blood sugar 3 times a day. Dx code e11.65  ? insulin degludec (TRESIBA FLEXTOUCH) 100 UNIT/ML FlexTouch Pen Inject 15 Units into the skin at bedtime.  ? Lancets (ONETOUCH ULTRASOFT) lancets Use as instructed to check blood sugars 4 times daily E11.9  ? lisinopril (ZESTRIL) 10 MG tablet TAKE ONE TABLET BY MOUTH EVERY MORNING  ? metFORMIN (GLUCOPHAGE XR) 750 MG 24 hr tablet Take 1 tablet (750 mg total) by mouth daily with breakfast.  ? Semaglutide,0.25 or 0.5MG/DOS, (OZEMPIC, 0.25 OR 0.5 MG/DOSE,) 2 MG/1.5ML SOPN Inject 0.5 mg into the skin once a week.  ? ?No current facility-administered medications for this visit. (Other)  ? ? ? ? ?REVIEW OF SYSTEMS: ?ROS   ?Negative for: Constitutional, Gastrointestinal, Neurological, Skin, Genitourinary, Musculoskeletal, HENT, Endocrine, Cardiovascular, Eyes, Respiratory, Psychiatric, Allergic/Imm, Heme/Lymph ?Last edited by Silvestre Moment on 10/25/2021  8:32 AM.  ?  ? ? ? ?ALLERGIES ?No Known Allergies ? ?PAST MEDICAL HISTORY ?Past Medical History:  ?Diagnosis Date  ? COVID-19 virus infection 10/13/2018  ? Diabetes mellitus without complication ([place])   ? Hypercholesteremia   ? Sleep apnea   ? Newly diagnosed in September 2021  ? ?Past Surgical History:  ?Procedure Laterality Date  ? ABDOMINAL HYSTERECTOMY    ? CATARACT EXTRACTION Bilateral   ? CESAREAN SECTION    ? MYOMECTOMY    ? ? ?FAMILY  HISTORY ?Family History  ?Problem Relation Age of Onset  ? Hypertension Mother   ? Diabetes Mother   ? Stroke Mother   ? Diabetes Father   ? Hypertension Father   ? Stroke Father   ? Hypertension Son   ? Diabetes Son   ? Breast cancer Neg Hx   ? ? ?SOCIAL HISTORY ?Social History  ? ?Tobacco Use  ? Smoking status: Former  ?  Types: Cigarettes  ?  Quit date: 2006  ?  Years since quitting: 17.3  ? Smokeless tobacco: Never  ? Tobacco comments:  ?  quit 10 yrs  ?Vaping Use  ? Vaping Use: Never used  ?Substance Use Topics  ? Alcohol use: Yes  ?  Comment: 1 glass of wine sometimes  daily   ? Drug use: Never  ? ?  ? ?  ? ?OPHTHALMIC EXAM: ? ?Base Eye Exam   ? ? Visual Acuity (ETDRS)   ? ?   Right Left  ? Dist [place] 20/20 -2 20/20 -1  ? ?  ?  ? ? Tonometry (Tonopen, 8:36 AM)   ? ?   Right Left  ? Pressure 12 14  ? ?  ?  ? ? Pupils   ? ?   Pupils APD  ? Right PERRL None  ? Left PERRL None  ? ?  ?  ? ? Visual Fields   ? ?   Left Right  ?  Full Full  ? ?  ?  ? ? Extraocular Movement   ? ?   Right Left  ?  Full Full  ? ?  ?  ? ? Neuro/Psych   ? ? Oriented x3: Yes  ? Mood/Affect: Normal  ? ?  ?  ? ? Dilation   ? ? Left eye: 2.5% Phenylephrine, 1.0% Mydriacyl @ 8:35 AM  ? ?  ?  ? ?  ? ?Slit Lamp and Fundus Exam   ? ? External Exam   ? ?   Right Left  ? External Normal Normal  ? ?  ?  ? ? Slit Lamp Exam   ? ?   Right Left  ? Lids/Lashes Normal Normal  ? Conjunctiva/Sclera White and quiet White and quiet  ? Cornea Clear Clear  ? Anterior Chamber Deep and quiet Deep and quiet  ? Iris Round and reactive Round and reactive  ? Lens Posterior chamber intraocular lens Posterior chamber intraocular lens  ? Anterior Vitreous Normal Normal  ? ?  ?  ? ? Fundus Exam   ? ?   Right Left  ? Posterior Vitreous  Normal, small vitreous debris, no cells, no overt PVD  ? Disc  Normal, posterior pole cotton-wool spots noted  ? C/D Ratio  0.2  ? Macula  Microaneurysms, no detectable clinically significant macular edema  ? Vessels  NPDR-Severe  ? Periphery  Normal  ? ?  ?  ? ?  ? ? ?IMAGING AND PROCEDURES  ?Imaging and Procedures for 10/25/21 ? ?OCT, Retina - OU - Both Eyes   ? ?   ?Right Eye ?Quality was good. Scan locations included subfoveal. Central Foveal Thickness: 274. Progression has improved. Findings include abnormal foveal contour.  ? ?Left Eye ?Quality was good. Scan locations included subfoveal. Central Foveal Thickness: 314. Progression has been stable. Findings include abnormal foveal contour.  ? ?Notes ?CSME OD, now some 8 months status post most recent injection Avastin concomitantly patient is using CPAP  effectively and compliantly thus less nightly  macular hypoxia ? ? ?OS with significant in thickening inferior portion of the FAZ extending into the center involvement post resumption of Avastin therapy March 2023.  Will need repeat and resume injection Avastin OS only today, and reevaluate in 7 to 8 weeks ? ?  ? ?Intravitreal Injection, Pharmacologic Agent - OS - Left Eye   ? ?   ?Time Out ?10/25/2021. 9:05 AM. Confirmed correct patient, procedure, site, and patient consented.  ? ?Anesthesia ?Topical anesthesia was used. Anesthetic medications included Lidocaine 4%.  ? ?Procedure ?Preparation included 10% betadine to eyelids, Tobramycin 0.3%, 5% betadine to ocular surface. A 30 gauge needle was used.  ? ?Injection: ?2.5 mg bevacizumab 2.5 MG/0.1ML ?  Route: Intravitreal, Site: Left Eye ?  [place]: [id number], Lot: [id number] a  ? ?Post-op ?Post injection exam found visual acuity of at least counting fingers. The patient tolerated the procedure well. There were no complications. The patient received written and verbal post procedure care education. Post injection medications were not given.  ? ?  ? ? ?  ?  ? ?  ?ASSESSMENT/PLAN: ? ?Sleep apnea in adult ?Patient continues to be compliant with CPAP use, which in my opinion prevents nightly hypoxic damage to the macular region ? ?Type 2 macular telangiectasis of right eye ?Improved macular findings OD, some 5 to 6 weeks most recent evaluation of the left eye. ? ?OD may be also benefiting from continued use of CPAP use, as last injection antivegF OD was 8 months prior ? ?Severe nonproliferative diabetic retinopathy of left eye, with macular edema, associated with type 2 diabetes mellitus ([place]) ?OS much improved post Avastin injection, 6 weeks prior  ? ?  ICD-10-CM   ?1. Severe nonproliferative diabetic retinopathy of left eye, with macular edema, associated with type 2 diabetes mellitus ([place])  Y29.0379 OCT, Retina - OU - Both Eyes  ?  Intravitreal Injection, Pharmacologic Agent -  OS - Left Eye  ?  bevacizumab (AVASTIN) SOSY 2.5 mg  ?  ?2. Sleep apnea in adult  G47.30   ?  ?3. Type 2 macular telangiectasis of right eye  H35.071   ?  ? ? ?1.  OS much improved macular edema post Avasti

## 2021-10-25 NOTE — Assessment & Plan Note (Signed)
Patient continues to be compliant with CPAP use, which in my opinion prevents nightly hypoxic damage to the macular region ?

## 2021-10-26 ENCOUNTER — Telehealth: Payer: Self-pay

## 2021-10-26 NOTE — Chronic Care Management (AMB) (Signed)
Chronic Care Management Pharmacy Assistant   Name: Shannon [name]  MRN: [id number] DOB: 1956-01-30  Reason for Encounter: Disease State/ General  Recent office visits:  None  Recent consult visits:  10-25-2021 [name], MD (Ophthalmology). Avastin injection given. OCT, Retina - OU - Both Eyes.  Hospital visits:  Medication Reconciliation was completed by comparing discharge summary, patient's EMR and Pharmacy list, and upon discussion with patient.   [facility] on 06/27/2021 due to Acute viral pharyngitis. Discharge date was 06/27/2021.     New?Medications Started at [facility] Discharge:?? Benzonatate 100 mg Oral 3 times daily PRN due to Acute viral pharyngitis   Medication Changes at Hospital Discharge: None   Medications Discontinued at Hospital Discharge: None   Medications that remain the same after Hospital Discharge:??  -All other medications will remain the same.      Medications: Outpatient Encounter Medications as of 10/26/2021  Medication Sig   atorvastatin (LIPITOR) 20 MG tablet TAKE ONE TABLET BY MOUTH EVERY EVENING with A meal   blood glucose meter kit and supplies KIT Dispense based on patient and insurance preference. Use 3 times daily as directed to check blood sugar. Dx code e11.65   Blood Glucose Monitoring Suppl (ONE TOUCH ULTRA MINI) w/Device KIT 1 each by Does not apply route 4 (four) times daily -  before meals and at bedtime.   Cholecalciferol (VITAMIN D-3 PO) Take 1 capsule by mouth daily. 2000 units   Continuous Blood Gluc Receiver (FREESTYLE LIBRE 14 DAY READER) DEVI USE TO CHECK BLOOD SUGARS FOUR TIMES A DAY DX:E11.65   Continuous Blood Gluc Sensor (FREESTYLE LIBRE 2 SENSOR) MISC CHANGE sensor every 14 DAYS   glucagon (GLUCAGEN HYPOKIT) 1 MG SOLR injection Inject 1 mg into the vein once as needed for up to 1 dose for low blood sugar.   glucose blood (FREESTYLE TEST STRIPS) test strip Use as instructed   glucose blood (FREESTYLE TEST  STRIPS) test strip Use as instructed   glucose blood test strip Use as instructed   glucose blood test strip Use as instructed to check blood sugar 3 times a day. Dx code e11.65   insulin degludec (TRESIBA FLEXTOUCH) 100 UNIT/ML FlexTouch Pen Inject 15 Units into the skin at bedtime.   Lancets (ONETOUCH ULTRASOFT) lancets Use as instructed to check blood sugars 4 times daily E11.9   lisinopril (ZESTRIL) 10 MG tablet TAKE ONE TABLET BY MOUTH EVERY MORNING   metFORMIN (GLUCOPHAGE XR) 750 MG 24 hr tablet Take 1 tablet (750 mg total) by mouth daily with breakfast.   Semaglutide,0.25 or 0.5MG/DOS, (OZEMPIC, 0.25 OR 0.5 MG/DOSE,) 2 MG/1.5ML SOPN Inject 0.5 mg into the skin once a week.   No facility-administered encounter medications on file as of 10/26/2021.   Contacted [facility] for General Review Call   Chart Review:  Have there been any documented new, changed, or discontinued medications since last visit? No  Has there been any documented recent hospitalizations or ED visits since last visit with Clinical Pharmacist? No Brief Summary: None  Adherence Review:  Does the Clinical Pharmacist Assistant have access to adherence rates? Yes Adherence rates for STAR metric medications: None Adherence rates for medications indicated for disease state being reviewed: None Does the patient have >5 day gap between last estimated fill dates for any of the above medications or other medication gaps? No Reason for medication gaps: None   Disease State Questions:  Able to connect with Patient? Yes  Did patient  have any problems with their health recently? No  Have you had any admissions or emergency room visits or worsening of your condition(s) since last visit? No  Have you had any visits with new specialists or providers since your last visit? Yes Explain: Ophthalmologist on 10-25-2021  Have you had any new health care problem(s) since your last visit? No  Have you run out of any of your  medications since you last spoke with clinical pharmacist? No  Are there any medications you are not taking as prescribed? No  Are you having any issues or side effects with your medications? No  Do you have any other health concerns or questions you want to discuss with your Clinical Pharmacist before your next visit? No  Are there any health concerns that you feel we can do a better job addressing? No Note Patient's response. Are you having any problems with any of the following since the last visit: (select all that apply)  None-  12. Any falls since last visit? No   13. Any increased or uncontrolled pain since last visit? No   14. Next visit Type: office       Visit with:  Kellie Simmering, LPN        Date: [id number]        Time: 8:20  15. Additional Details? Patient stated she has 2 weeks worth of Ozempic. Contacted [facility] and was informed that they couldn't accept her proof of income which was a picture of a check stub. Patient will need a valid proof of income. Patient stated she will drop off social security statement and will ask for a sample at [facility].  Care Gaps: Covid booster overdue last completed 01-11-2021 AWV 11-30-2021  Star Rating Drugs: Atorvastatin 20 mg- Last filled 10-18-2021 30 [facility] Metformin 750 mg- Last filled 10-18-2021 [facility] Lisinopril 10 mg- Last filled 10-18-2021 30 [facility] Ozempic - Patient assistance  [place] Pharmacist Assistant [phone]

## 2021-11-06 ENCOUNTER — Telehealth: Payer: Self-pay

## 2021-11-06 NOTE — Chronic Care Management (AMB) (Addendum)
Chronic Care Management Pharmacy Assistant   Name: Shannon [name]  MRN: [id number] DOB: 05/19/1956  Reason for Encounter: Medication Review/ Medication coordination  Recent office visits:  None  Recent consult visits:  None  Hospital visits:  Medication Reconciliation was completed by comparing discharge summary, patient's EMR and Pharmacy list, and upon discussion with patient.   [facility] on 06/27/2021 due to Acute viral pharyngitis. Discharge date was 06/27/2021.     New?Medications Started at [facility] Discharge:?? Benzonatate 100 mg Oral 3 times daily PRN due to Acute viral pharyngitis   Medication Changes at Hospital Discharge: None   Medications Discontinued at Hospital Discharge: None   Medications that remain the same after Hospital Discharge:??  -All other medications will remain the same.    Medications: Outpatient Encounter Medications as of 11/06/2021  Medication Sig   atorvastatin (LIPITOR) 20 MG tablet TAKE ONE TABLET BY MOUTH EVERY EVENING with A meal   blood glucose meter kit and supplies KIT Dispense based on patient and insurance preference. Use 3 times daily as directed to check blood sugar. Dx code e11.65   Blood Glucose Monitoring Suppl (ONE TOUCH ULTRA MINI) w/Device KIT 1 each by Does not apply route 4 (four) times daily -  before meals and at bedtime.   Cholecalciferol (VITAMIN D-3 PO) Take 1 capsule by mouth daily. 2000 units   Continuous Blood Gluc Receiver (FREESTYLE LIBRE 14 DAY READER) DEVI USE TO CHECK BLOOD SUGARS FOUR TIMES A DAY DX:E11.65   Continuous Blood Gluc Sensor (FREESTYLE LIBRE 2 SENSOR) MISC CHANGE sensor every 14 DAYS   glucagon (GLUCAGEN HYPOKIT) 1 MG SOLR injection Inject 1 mg into the vein once as needed for up to 1 dose for low blood sugar.   glucose blood (FREESTYLE TEST STRIPS) test strip Use as instructed   glucose blood (FREESTYLE TEST STRIPS) test strip Use as instructed   glucose blood test strip Use as  instructed   glucose blood test strip Use as instructed to check blood sugar 3 times a day. Dx code e11.65   insulin degludec (TRESIBA FLEXTOUCH) 100 UNIT/ML FlexTouch Pen Inject 15 Units into the skin at bedtime.   Lancets (ONETOUCH ULTRASOFT) lancets Use as instructed to check blood sugars 4 times daily E11.9   lisinopril (ZESTRIL) 10 MG tablet TAKE ONE TABLET BY MOUTH EVERY MORNING   metFORMIN (GLUCOPHAGE XR) 750 MG 24 hr tablet Take 1 tablet (750 mg total) by mouth daily with breakfast.   Semaglutide,0.25 or 0.5MG/DOS, (OZEMPIC, 0.25 OR 0.5 MG/DOSE,) 2 MG/1.5ML SOPN Inject 0.5 mg into the skin once a week.   No facility-administered encounter medications on file as of 11/06/2021.  Reviewed chart for medication changes ahead of medication coordination call.  No hospital  since last care coordination call/Pharmacist visit.  No medication changes indicated OR if recent visit, treatment plan here.  BP Readings from Last 3 Encounters:  09/28/21 118/62  06/27/21 (!) 115/99  06/22/21 120/68    Lab Results  Component Value Date   HGBA1C 6.8 (H) 09/28/2021     Patient obtains medications through Adherence Packaging  30 Days   Last adherence delivery included:  Tresiba 15 units at bed time Lisinopril 10 mg 1 tablet daily (breakfast) Vitamin D3 50 mcg - 1 capsule daily (evening meal) Atorvastatin 20 mg 1 tablet daily (evening meal) Metformin 750 mg 1 tablet daily (breakfast) Freestyle Libre Kit 2 Sensors- Use as directed every 14 days  Patient declined (meds) last month due to  PRN use/additional supply on hand. Explanation of abundance on hand (ie #30 due to overlapping fills or previous adherence issues etc)  Patient is due for next adherence delivery on: 11-16-2021  Called patient and reviewed medications and coordinated delivery.  NOTES: Patient stated to take $3.00 off card on file and son will get delivery. OK to leave in screen door if son isn't home.  This delivery to  include: Tresiba 15 units at bed time Lisinopril 10 mg 1 tablet daily (breakfast) Vitamin D3 50 mcg - 1 capsule daily (evening meal) Atorvastatin 20 mg 1 tablet daily (evening meal) Metformin 750 mg 1 tablet daily (breakfast) Freestyle Libre Kit 2 Sensors- Use as directed every 14 days  No short or acute fill needed  Patient declined the following medications: None  Patient needs refills for: None  Confirmed delivery date of 11-16-2021 advised patient that pharmacy will contact them the morning of delivery.  11-21-2021: Patient stated income verification was dropped off today to be faxed to [facility].  Care Gaps: Covid booster overdue last completed 01-11-2021 AWV 11-30-2021  Star Rating Drugs: Atorvastatin 20 mg- Last filled 10-12-2021 30 DS [facility] Metformin 750 mg- Last filled 10-12-2021 30 [facility] Lisinopril 10 mg- Last filled 10-12-2021 30 DS [facility] Ozempic - Patient assistance  [place] Pharmacist Assistant [phone]

## 2021-11-21 ENCOUNTER — Ambulatory Visit (INDEPENDENT_AMBULATORY_CARE_PROVIDER_SITE_OTHER): Payer: HMO | Admitting: Nurse Practitioner

## 2021-11-21 VITALS — BP 126/64 | HR 62 | Temp 97.8°F | Ht 62.2 in | Wt 154.0 lb

## 2021-11-21 DIAGNOSIS — M25552 Pain in left hip: Secondary | ICD-10-CM

## 2021-11-21 DIAGNOSIS — E113493 Type 2 diabetes mellitus with severe nonproliferative diabetic retinopathy without macular edema, bilateral: Secondary | ICD-10-CM

## 2021-11-21 DIAGNOSIS — Z794 Long term (current) use of insulin: Secondary | ICD-10-CM | POA: Diagnosis not present

## 2021-11-21 DIAGNOSIS — M5432 Sciatica, left side: Secondary | ICD-10-CM

## 2021-11-21 MED ORDER — TRIAMCINOLONE ACETONIDE 40 MG/ML IJ SUSP
40.0000 mg | Freq: Once | INTRAMUSCULAR | Status: AC
  Administered 2021-11-21: 40 mg via INTRAMUSCULAR

## 2021-11-21 MED ORDER — KETOROLAC TROMETHAMINE 60 MG/2ML IM SOLN
60.0000 mg | Freq: Once | INTRAMUSCULAR | Status: AC
  Administered 2021-11-21: 60 mg via INTRAMUSCULAR

## 2021-11-21 NOTE — Progress Notes (Signed)
I,Shannon [name],acting as a Education administrator for [employer], FNP.,have documented all relevant documentation on the behalf of Shannon Brine, FNP,as directed by  Shannon Brine, FNP while in the presence of Shannon [name], Bells.  This visit occurred during the SARS-CoV-2 public health emergency.  Safety protocols were in place, including screening questions prior to the visit, additional usage of staff PPE, and extensive cleaning of exam room while observing appropriate contact time as indicated for disinfecting solutions.  Subjective:     Patient ID: Shannon [name] , female    DOB: August 11, 1955 , 66 y.o.   MRN: [id number]   Chief Complaint  Patient presents with   Hip Pain    HPI  Her blood sugar has been elevated has been out of Ozempic.   Hip Pain  The incident occurred more than 1 week ago (3 weeks ago). There was no injury mechanism. The pain is present in the left hip. The quality of the pain is described as aching, burning and shooting. The pain is at a severity of 10/10. The pain is severe. The pain has been Intermittent since onset. Associated symptoms include numbness and tingling. Pertinent negatives include no loss of motion. Nothing aggravates the symptoms. She has tried NSAIDs (She has been drinking wine) for the symptoms. The treatment provided moderate relief.     Past Medical History:  Diagnosis Date   COVID-19 virus infection 10/13/2018   Diabetes mellitus without complication ([place])    Hypercholesteremia    Sleep apnea    Newly diagnosed in September 2021     Family History  Problem Relation Age of Onset   Hypertension Mother    Diabetes Mother    Stroke Mother    Diabetes Father    Hypertension Father    Stroke Father    Hypertension Son    Diabetes Son    Breast cancer Neg Hx      Current Outpatient Medications:    atorvastatin (LIPITOR) 20 MG tablet, TAKE ONE TABLET BY MOUTH EVERY EVENING with A meal, Disp: 90 tablet, Rfl: 1   blood glucose meter kit and supplies  KIT, Dispense based on patient and insurance preference. Use 3 times daily as directed to check blood sugar. Dx code e11.65, Disp: 1 each, Rfl: 9   Blood Glucose Monitoring Suppl (ONE TOUCH ULTRA MINI) w/Device KIT, 1 each by Does not apply route 4 (four) times daily -  before meals and at bedtime., Disp: 1 kit, Rfl: 0   Cholecalciferol (VITAMIN D-3 PO), Take 1 capsule by mouth daily. 2000 units, Disp: , Rfl:    Continuous Blood Gluc Receiver (FREESTYLE LIBRE 14 DAY READER) DEVI, USE TO CHECK BLOOD SUGARS FOUR TIMES A DAY DX:E11.65, Disp: 1 each, Rfl: 1   Continuous Blood Gluc Sensor (FREESTYLE LIBRE 2 SENSOR) MISC, CHANGE sensor every 14 DAYS, Disp: 2 each, Rfl: 1   gabapentin (NEURONTIN) 300 MG capsule, Take 300 mg by mouth 3 (three) times daily. Take at bedtime for 3 days, take twice daily 3 days, then take 3 times a day, Disp: , Rfl:    glucagon (GLUCAGEN HYPOKIT) 1 MG SOLR injection, Inject 1 mg into the vein once as needed for up to 1 dose for low blood sugar., Disp: 1 each, Rfl: 5   glucose blood (FREESTYLE TEST STRIPS) test strip, Use as instructed, Disp: 100 each, Rfl: 12   glucose blood (FREESTYLE TEST STRIPS) test strip, Use as instructed, Disp: 100 each, Rfl: 12   glucose blood test strip, Use  as instructed, Disp: 100 each, Rfl: 5   glucose blood test strip, Use as instructed to check blood sugar 3 times a day. Dx code e11.65, Disp: 100 each, Rfl: 12   insulin degludec (TRESIBA FLEXTOUCH) 100 UNIT/ML FlexTouch Pen, Inject 15 Units into the skin at bedtime., Disp: 15 mL, Rfl: 1   Lancets (ONETOUCH ULTRASOFT) lancets, Use as instructed to check blood sugars 4 times daily E11.9, Disp: 300 each, Rfl: 3   lisinopril (ZESTRIL) 10 MG tablet, TAKE ONE TABLET BY MOUTH EVERY MORNING, Disp: 90 tablet, Rfl: 0   meloxicam (MOBIC) 15 MG tablet, Take 15 mg by mouth daily. Take 1 daily for 28 days, Disp: , Rfl:    metFORMIN (GLUCOPHAGE XR) 750 MG 24 hr tablet, Take 1 tablet (750 mg total) by mouth daily  with breakfast., Disp: 90 tablet, Rfl: 1   methocarbamol (ROBAXIN) 500 MG tablet, Take 500 mg by mouth 4 (four) times daily. For 5 days, Disp: , Rfl:    Semaglutide,0.25 or 0.5MG/DOS, (OZEMPIC, 0.25 OR 0.5 MG/DOSE,) 2 MG/1.5ML SOPN, Inject 0.5 mg into the skin once a week., Disp: 4.5 mL, Rfl: 1   No Known Allergies   Review of Systems  Constitutional: Negative.   Respiratory: Negative.    Cardiovascular: Negative.   Musculoskeletal:        Left hip pain  Neurological:  Positive for tingling and numbness.  Psychiatric/Behavioral: Negative.       Today's Vitals   11/21/21 1109  BP: 126/64  Pulse: 62  Temp: 97.8 F (36.6 C)  TempSrc: Oral  Weight: 154 lb (69.9 kg)  Height: 5' 2.2" (1.58 m)  PainSc: 10-Worst pain ever   Body mass index is 27.99 kg/m.  Wt Readings from Last 3 Encounters:  11/30/21 152 lb 6.4 oz (69.1 kg)  11/21/21 154 lb (69.9 kg)  09/28/21 151 lb (68.5 kg)    Objective:  Physical Exam Vitals reviewed.  Constitutional:      General: She is not in acute distress.    Appearance: Normal appearance. She is well-developed.  Cardiovascular:     Rate and Rhythm: Normal rate and regular rhythm.     Pulses: Normal pulses.     Heart sounds: Normal heart sounds. No murmur heard. Pulmonary:     Effort: Pulmonary effort is normal. No respiratory distress.     Breath sounds: Normal breath sounds. No wheezing.  Chest:     Chest wall: No tenderness.  Musculoskeletal:        General: Normal range of motion.  Skin:    General: Skin is warm and dry.     Capillary Refill: Capillary refill takes less than 2 seconds.  Neurological:     General: No focal deficit present.     Mental Status: She is alert and oriented to person, place, and time.     Cranial Nerves: No cranial nerve deficit.     Motor: No weakness.  Psychiatric:        Mood and Affect: Mood normal.        Behavior: Behavior normal.        Thought Content: Thought content normal.        Judgment:  Judgment normal.         Assessment And Plan:     1. Left hip pain Comments: Tenderness to left hip, negative clicking. Will treat with toradol and kenalog injection - ketorolac (TORADOL) injection 60 mg - triamcinolone acetonide (KENALOG-40) injection 40 mg  2. Left sciatic nerve  pain Comments: Encouraged to stretch regularly  3. 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 Made [name] pharmacist aware form was dropped off.  - Microalbumin / Creatinine Urine Ratio     Patient was given opportunity to ask questions. Patient verbalized understanding of the plan and was able to repeat key elements of the plan. All questions were answered to their satisfaction.  Shannon Brine, FNP   I, Shannon Brine, FNP, have reviewed all documentation for this visit. The documentation on 11/21/21 for the exam, diagnosis, procedures, and orders are all accurate and complete.   IF YOU HAVE BEEN REFERRED TO A SPECIALIST, IT MAY TAKE 1-2 WEEKS TO SCHEDULE/PROCESS THE REFERRAL. IF YOU HAVE NOT HEARD FROM US/SPECIALIST IN TWO WEEKS, PLEASE GIVE [place] A CALL AT [phone] X [phone].   THE PATIENT IS ENCOURAGED TO PRACTICE SOCIAL DISTANCING DUE TO THE COVID-19 PANDEMIC.

## 2021-11-22 ENCOUNTER — Telehealth: Payer: Self-pay

## 2021-11-22 LAB — MICROALBUMIN / CREATININE URINE RATIO
Creatinine, Urine: 261.5 mg/dL
Microalb/Creat Ratio: 16 mg/g creat (ref 0–29)
Microalbumin, Urine: 41.3 ug/mL

## 2021-11-22 NOTE — Chronic Care Management (AMB) (Cosign Needed)
Faxed re-enrollment paperwork with updated income to [employer] patient assistance program for Ozempic 0.25/0.5 mg.  Billee Cashing, CMA Clinical Pharmacist Assistant [phone]

## 2021-11-28 DIAGNOSIS — M545 Low back pain, unspecified: Secondary | ICD-10-CM | POA: Diagnosis not present

## 2021-11-30 ENCOUNTER — Ambulatory Visit (INDEPENDENT_AMBULATORY_CARE_PROVIDER_SITE_OTHER): Payer: HMO

## 2021-11-30 ENCOUNTER — Ambulatory Visit: Payer: HMO | Admitting: Nurse Practitioner

## 2021-11-30 VITALS — BP 122/60 | HR 74 | Temp 97.9°F | Ht 62.4 in | Wt 152.4 lb

## 2021-11-30 DIAGNOSIS — Z0000 Encounter for general adult medical examination without abnormal findings: Secondary | ICD-10-CM

## 2021-11-30 NOTE — Progress Notes (Signed)
Subjective:   Shannon [name] is a 66 y.o. female who presents for Medicare Annual (Subsequent) preventive examination.  Review of Systems     Cardiac Risk Factors include: advanced age (>38mn, >>51women);diabetes mellitus     Objective:    Today's Vitals   11/30/21 0818  BP: 122/60  Pulse: 74  Temp: 97.9 F (36.6 C)  TempSrc: Oral  SpO2: 90%  Weight: 152 lb 6.4 oz (69.1 kg)  Height: 5' 2.4" (1.585 m)   Body mass index is 27.52 kg/m.     11/30/2021    8:32 AM 11/17/2020    8:42 AM 11/16/2019    3:08 PM 07/23/2019    3:33 PM 10/12/2018    7:39 PM 06/17/2018    6:00 PM 06/17/2018   12:38 PM  Advanced Directives  Does Patient Have a Medical Advance Directive? Yes Yes _0   Type of AParamed[place]of [place] will       [place] of [place]? No - copy requested No - copy requested       Would patient like information on creating a medical advance directive?   Yes (MAU/Ambulatory/Procedural Areas - Information given) No - Patient declined No - Patient declined No - Patient declined     Current Medications (verified) Outpatient Encounter Medications as of 11/30/2021  Medication Sig   atorvastatin (LIPITOR) 20 MG tablet TAKE ONE TABLET BY MOUTH EVERY EVENING with A meal   blood glucose meter kit and supplies KIT Dispense based on patient and insurance preference. Use 3 times daily as directed to check blood sugar. Dx code e11.65   Blood Glucose Monitoring Suppl (ONE TOUCH ULTRA MINI) w/Device KIT 1 each by Does not apply route 4 (four) times daily -  before meals and at bedtime.   Cholecalciferol (VITAMIN D-3 PO) Take 1 capsule by mouth daily. 2000 units   Continuous Blood Gluc Receiver (FREESTYLE LIBRE 14 DAY READER) DEVI USE TO CHECK BLOOD SUGARS FOUR TIMES A DAY DX:E11.65   Continuous Blood Gluc Sensor (FREESTYLE LIBRE 2 SENSOR) MISC CHANGE sensor every 14 DAYS   gabapentin (NEURONTIN)  300 MG capsule Take 300 mg by mouth 3 (three) times daily. Take at bedtime for 3 days, take twice daily 3 days, then take 3 times a day   glucagon (GLUCAGEN HYPOKIT) 1 MG SOLR injection Inject 1 mg into the vein once as needed for up to 1 dose for low blood sugar.   glucose blood (FREESTYLE TEST STRIPS) test strip Use as instructed   glucose blood (FREESTYLE TEST STRIPS) test strip Use as instructed   glucose blood test strip Use as instructed   glucose blood test strip Use as instructed to check blood sugar 3 times a day. Dx code e11.65   insulin degludec (TRESIBA FLEXTOUCH) 100 UNIT/ML FlexTouch Pen Inject 15 Units into the skin at bedtime.   Lancets (ONETOUCH ULTRASOFT) lancets Use as instructed to check blood sugars 4 times daily E11.9   lisinopril (ZESTRIL) 10 MG tablet TAKE ONE TABLET BY MOUTH EVERY MORNING   meloxicam (MOBIC) 15 MG tablet Take 15 mg by mouth daily. Take 1 daily for 28 days   metFORMIN (GLUCOPHAGE XR) 750 MG 24 hr tablet Take 1 tablet (750 mg total) by mouth daily with breakfast.   methocarbamol (ROBAXIN) 500 MG tablet Take 500 mg by mouth 4 (four) times daily. For 5 days   Semaglutide,0.25 or 0.5MG/DOS, (OZEMPIC, 0.25  OR 0.5 MG/DOSE,) 2 MG/1.5ML SOPN Inject 0.5 mg into the skin once a week.   No facility-administered encounter medications on file as of 11/30/2021.    Allergies (verified) Patient has no known allergies.   History: Past Medical History:  Diagnosis Date   COVID-19 virus infection 10/13/2018   Diabetes mellitus without complication ([place])    Hypercholesteremia    Sleep apnea    Newly diagnosed in September 2021   Past Surgical History:  Procedure Laterality Date   ABDOMINAL HYSTERECTOMY     CATARACT EXTRACTION Bilateral    CESAREAN SECTION     MYOMECTOMY     Family History  Problem Relation Age of Onset   Hypertension Mother    Diabetes Mother    Stroke Mother    Diabetes Father    Hypertension Father    Stroke Father    Hypertension Son     Diabetes Son    Breast cancer Neg Hx    Social History   Socioeconomic History   Marital status: Single    Spouse name: Not on file   Number of children: Not on file   Years of education: Not on file   Highest education level: Not on file  Occupational History   Not on file  Tobacco Use   Smoking status: Former    Types: Cigarettes    Quit date: 2006    Years since quitting: 17.4   Smokeless tobacco: Never   Tobacco comments:    quit 10 yrs  Vaping Use   Vaping Use: Never used  Substance and Sexual Activity   Alcohol use: Yes    Comment: 1 glass of wine sometimes daily    Drug use: Never   Sexual activity: Not on file  Other Topics Concern   Not on file  Social History Narrative   Not on file   Social Determinants of Health   Financial Resource Strain: Low Risk  (11/30/2021)   Overall Financial Resource Strain (CARDIA)    Difficulty of Paying Living Expenses: Not hard at all  Food Insecurity: No Food Insecurity (11/30/2021)   Hunger Vital Sign    Worried About Running Out of Food in the Last Year: Never true    [place] in the Last Year: Never true  Transportation Needs: No Transportation Needs (11/30/2021)   PRAPARE - Hydrologist (Medical): No    Lack of Transportation (Non-Medical): No  Physical Activity: Sufficiently Active (11/30/2021)   Exercise Vital Sign    Days of Exercise per Week: 4 days    Minutes of Exercise per Session: 50 min  Stress: No Stress Concern Present (11/30/2021)   [place]    Feeling of Stress : Not at all  Social Connections: Not on file    Tobacco Counseling Counseling given: Not Answered Tobacco comments: quit 10 yrs   Clinical Intake:  Pre-visit preparation completed: Yes  Pain : No/denies pain     Nutritional Status: BMI 25 -29 Overweight Nutritional Risks: None Diabetes: Yes  How often do you need to have someone help  you when you read instructions, pamphlets, or other written materials from your doctor or pharmacy?: 1 - Never 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 Yes Nutrition Risk Assessment:  Has the patient had any N/V/D within the last 2 months?  No  Does the patient have any non-healing wounds?  No  Has the patient had any unintentional weight loss or weight gain?  No   Diabetes:  Is the patient diabetic?  Yes  If diabetic, was a CBG obtained today?  No  Did the patient bring in their glucometer from home?  No  How often do you monitor your CBG's? daily.   Financial Strains and Diabetes Management:  Are you having any financial strains with the device, your supplies or your medication? No .  Does the patient want to be seen by Chronic Care Management for management of their diabetes?  No  Would the patient like to be referred to a Nutritionist or for Diabetic Management?  No   Diabetic Exams:  Diabetic Eye Exam: Completed 11/30/2020 Diabetic Foot Exam: Overdue, Pt has been advised about the importance in completing this exam. Pt is scheduled for diabetic foot exam on next appointment.   Interpreter Needed?: No  Information entered by :: NAllen LPN   Activities of Daily Living    11/30/2021    8:33 AM  In your present state of health, do you have any difficulty performing the following activities:  Hearing? 0  Vision? 0  Difficulty concentrating or making decisions? 0  Walking or climbing stairs? 0  Dressing or bathing? 0  Doing errands, shopping? 0  Preparing Food and eating ? N  Using the Toilet? N  In the past six months, have you accidently leaked urine? Y  Do you have problems with loss of bowel control? N  Managing your Medications? N  Managing your Finances? N  Housekeeping or managing your Housekeeping? N    Patient Care Team: [name], FNP as PCP - General (General Practice) Mayford Knife, [facility]  (Pharmacist)  Indicate any recent Medical Services you may have received from other than [facility] providers in the past year (date may be approximate).     Assessment:   This is a routine wellness examination for Roxi.  Hearing/Vision screen Vision Screening - Comments:: Regular eye exams, Dr. Zadie Rhine  Dietary issues and exercise activities discussed: Current Exercise Habits: Structured exercise class, Type of exercise: Other - see comments (water aerobics), Time (Minutes): 45, Frequency (Times/Week): 4, Weekly Exercise (Minutes/Week): 180   Goals Addressed             This Visit's Progress    Patient Stated       11/30/2021, get A1c under control and control arthritis       Depression Screen    11/30/2021    8:33 AM 11/21/2021   11:08 AM 11/17/2020    8:43 AM 04/20/2020   10:07 AM 11/16/2019    2:44 PM 07/23/2019    2:55 PM 06/01/2019    8:51 AM  PHQ 2/9 Scores  PHQ - 2 Score 0 0 0 0 0 0 0  PHQ- 9 Score  0   0      Fall Risk    11/30/2021    8:33 AM 11/21/2021   11:08 AM 11/17/2020    8:43 AM 11/16/2019    2:44 PM 06/01/2019    8:51 AM  [place] in the past year? 0 0 0 0 0  Number falls in past yr: 0 0  0   Injury with Fall? 0 0  0   Risk for fall due to : Medication side effect No Fall Risks Medication side effect    Follow up Falls evaluation completed;Education provided;Falls prevention discussed Falls evaluation completed Falls evaluation completed;Education provided;Falls prevention discussed  FALL RISK PREVENTION PERTAINING TO THE HOME:  Any stairs in or around the home? No  If so, are there any without handrails?  N/a Home free of loose throw rugs in walkways, pet beds, electrical cords, etc? Yes  Adequate lighting in your home to reduce risk of falls? Yes   ASSISTIVE DEVICES UTILIZED TO PREVENT FALLS:  Life alert? No  Use of a cane, walker or w/c? No  Grab bars in the bathroom? No  Shower chair or bench in shower? Yes  Elevated toilet seat or  a handicapped toilet? No   TIMED UP AND GO:  Was the test performed? No .    Gait steady and fast without use of assistive device  Cognitive Function:        11/30/2021    8:34 AM 11/17/2020    8:44 AM 11/16/2019    2:43 PM  6CIT Screen  What Year? 0 points 0 points 0 points  What month? 0 points 0 points 0 points  What time? 0 points 0 points 0 points  Count back from 20 0 points 0 points 0 points  Months in reverse 0 points 0 points 0 points  Repeat phrase 4 points 2 points 0 points  Total Score 4 points 2 points 0 points    Immunizations Immunization History  Administered Date(s) Administered   Fluad Quad(high Dose 65+) 03/22/2020, 06/22/2021   Influenza,inj,Quad PF,6+ Mos 04/18/2018   Influenza-Unspecified 05/01/2019   PFIZER(Purple Top)SARS-COV-2 Vaccination 09/18/2019, 10/14/2019, 04/19/2020, 01/11/2021   PNEUMOCOCCAL CONJUGATE-20 09/28/2021   Pfizer Covid-19 Vaccine Bivalent Booster 1yr & up 10/23/2021   Pneumococcal Polysaccharide-23 05/30/2018   Tdap 11/28/2017   Zoster Recombinat (Shingrix) 02/22/2021, 08/15/2021    TDAP status: Up to date  Flu Vaccine status: Up to date  Pneumococcal vaccine status: Up to date  Covid-19 vaccine status: Completed vaccines  Qualifies for Shingles Vaccine? Yes   Zostavax completed Yes   Shingrix Completed?: Yes  Screening Tests Health Maintenance  Topic Date Due   FOOT EXAM  11/17/2021   OPHTHALMOLOGY EXAM  11/30/2021   INFLUENZA VACCINE  01/23/2022   HEMOGLOBIN A1C  03/30/2022   MAMMOGRAM  01/13/2023   COLONOSCOPY (Pts 45-456yrInsurance coverage will need to be confirmed)  04/11/2023   TETANUS/TDAP  11/29/2027   Pneumonia Vaccine 6561Years old  Completed   DEXA SCAN  Completed   COVID-19 Vaccine  Completed   Hepatitis C Screening  Completed   Zoster Vaccines- Shingrix  Completed   HPV VACCINES  Aged Out    Health Maintenance  Health Maintenance Due  Topic Date Due   FOOT EXAM  11/17/2021    OPHTHALMOLOGY EXAM  11/30/2021    Colorectal cancer screening: Type of screening: Colonoscopy. Completed 04/10/2013. Repeat every 10 years  Mammogram status: Completed 01/12/2021. Repeat every year  Bone Density status: Completed 12/09/2019.   Lung Cancer Screening: (Low Dose CT Chest recommended if Age 68-80ears, 30 pack-year currently smoking OR have quit w/in 15years.) does not qualify.   Lung Cancer Screening Referral: no  Additional Screening:  Hepatitis C Screening: does qualify; Completed 02/16/2020  Vision Screening: Recommended annual ophthalmology exams for early detection of glaucoma and other disorders of the eye. Is the patient up to date with their annual eye exam?  Yes  Who is the provider or what is the name of the office in which the patient attends annual eye exams? Dr. RaZadie Rhinef pt is not established with a provider, would they like to be referred  to a provider to establish care? No .   Dental Screening: Recommended annual dental exams for proper oral hygiene  Community Resource Referral / Chronic Care Management: CRR required this visit?  No   CCM required this visit?  No      Plan:     I have personally reviewed and noted the following in the patient's chart:   Medical and social history Use of alcohol, tobacco or illicit drugs  Current medications and supplements including opioid prescriptions.  Functional ability and status Nutritional status Physical activity Advanced directives List of other physicians Hospitalizations, surgeries, and ER visits in previous 12 months Vitals Screenings to include cognitive, depression, and falls Referrals and appointments  In addition, I have reviewed and discussed with patient certain preventive protocols, quality metrics, and best practice recommendations. A written personalized care plan for preventive services as well as general preventive health recommendations were provided to patient.     Kellie Simmering, LPN   06/30/1094   Nurse Notes: none

## 2021-11-30 NOTE — Patient Instructions (Signed)
[name] , Thank you for taking time to come for your Medicare Wellness Visit. I appreciate your ongoing commitment to your health goals. Please review the following plan we discussed and let me know if I can assist you in the future.   Screening recommendations/referrals: Colonoscopy: completed 04/10/2013, due 04/11/2023 Mammogram: completed 01/12/2021, due 01/13/2022 Bone Density: completed 12/09/2019 Recommended yearly ophthalmology/optometry visit for glaucoma screening and checkup Recommended yearly dental visit for hygiene and checkup  Vaccinations: Influenza vaccine: due 01/23/2022 Pneumococcal vaccine: completed 09/28/2021 Tdap vaccine: completed 11/28/2017, due 11/29/2027 Shingles vaccine: completed   Covid-19: 10/23/2021, 01/11/2021, 04/19/2020, 10/14/2019, 09/18/2019  Advanced directives: Please bring a copy of your POA (Power of Attorney) and/or Living Will to your next appointment.    Conditions/risks identified: none  Next appointment: Follow up in one year for your annual wellness visit    Preventive Care 65 Years and Older, Female Preventive care refers to lifestyle choices and visits with your health care provider that can promote health and wellness. What does preventive care include? A yearly physical exam. This is also called an annual well check. Dental exams once or twice a year. Routine eye exams. Ask your health care provider how often you should have your eyes checked. Personal lifestyle choices, including: Daily care of your teeth and gums. Regular physical activity. Eating a healthy diet. Avoiding tobacco and drug use. Limiting alcohol use. Practicing safe sex. Taking low-dose aspirin every day. Taking vitamin and mineral supplements as recommended by your health care provider. What happens during an annual well check? The services and screenings done by your health care provider during your annual well check will depend on your age, overall health, lifestyle risk  factors, and family history of disease. Counseling  Your health care provider may ask you questions about your: Alcohol use. Tobacco use. Drug use. Emotional well-being. Home and relationship well-being. Sexual activity. Eating habits. History of falls. Memory and ability to understand (cognition). Work and work Astronomer. Reproductive health. Screening  You may have the following tests or measurements: Height, weight, and BMI. Blood pressure. Lipid and cholesterol levels. These may be checked every 5 years, or more frequently if you are over 68 years old. Skin check. Lung cancer screening. You may have this screening every year starting at age 68 if you have a 30-pack-year history of smoking and currently smoke or have quit within the past 15 years. Fecal occult blood test (FOBT) of the stool. You may have this test every year starting at age 68. Flexible sigmoidoscopy or colonoscopy. You may have a sigmoidoscopy every 5 years or a colonoscopy every 10 years starting at age 68. Hepatitis C blood test. Hepatitis B blood test. Sexually transmitted disease (STD) testing. Diabetes screening. This is done by checking your blood sugar (glucose) after you have not eaten for a while (fasting). You may have this done every 1-3 years. Bone density scan. This is done to screen for osteoporosis. You may have this done starting at age 68. Mammogram. This may be done every 1-2 years. Talk to your health care provider about how often you should have regular mammograms. Talk with your health care provider about your test results, treatment options, and if necessary, the need for more tests. Vaccines  Your health care provider may recommend certain vaccines, such as: Influenza vaccine. This is recommended every year. Tetanus, diphtheria, and acellular pertussis (Tdap, Td) vaccine. You may need a Td booster every 10 years. Zoster vaccine. You may need this after age 68.  Pneumococcal 13-valent  conjugate (PCV13) vaccine. One dose is recommended after age 68. Pneumococcal polysaccharide (PPSV23) vaccine. One dose is recommended after age 68. Talk to your health care provider about which screenings and vaccines you need and how often you need them. This information is not intended to replace advice given to you by your health care provider. Make sure you discuss any questions you have with your health care provider. Document Released: 07/08/2015 Document Revised: 02/29/2016 Document Reviewed: 04/12/2015 Elsevier Interactive Patient Education  2017 [place] Prevention in the Home Falls can cause injuries. They can happen to people of all ages. There are many things you can do to make your home safe and to help prevent falls. What can I do on the outside of my home? Regularly fix the edges of walkways and driveways and fix any cracks. Remove anything that might make you trip as you walk through a door, such as a raised step or threshold. Trim any bushes or trees on the path to your home. Use bright outdoor lighting. Clear any walking paths of anything that might make someone trip, such as rocks or tools. Regularly check to see if handrails are loose or broken. Make sure that both sides of any steps have handrails. Any raised decks and porches should have guardrails on the edges. Have any leaves, snow, or ice cleared regularly. Use sand or salt on walking paths during winter. Clean up any spills in your garage right away. This includes oil or grease spills. What can I do in the bathroom? Use night lights. Install grab bars by the toilet and in the tub and shower. Do not use towel bars as grab bars. Use non-skid mats or decals in the tub or shower. If you need to sit down in the shower, use a plastic, non-slip stool. Keep the floor dry. Clean up any water that spills on the floor as soon as it happens. Remove soap buildup in the tub or shower regularly. Attach bath mats  securely with double-sided non-slip rug tape. Do not have throw rugs and other things on the floor that can make you trip. What can I do in the bedroom? Use night lights. Make sure that you have a light by your bed that is easy to reach. Do not use any sheets or blankets that are too big for your bed. They should not hang down onto the floor. Have a firm chair that has side arms. You can use this for support while you get dressed. Do not have throw rugs and other things on the floor that can make you trip. What can I do in the kitchen? Clean up any spills right away. Avoid walking on wet floors. Keep items that you use a lot in easy-to-reach places. If you need to reach something above you, use a strong step stool that has a grab bar. Keep electrical cords out of the way. Do not use floor polish or wax that makes floors slippery. If you must use wax, use non-skid floor wax. Do not have throw rugs and other things on the floor that can make you trip. What can I do with my stairs? Do not leave any items on the stairs. Make sure that there are handrails on both sides of the stairs and use them. Fix handrails that are broken or loose. Make sure that handrails are as long as the stairways. Check any carpeting to make sure that it is firmly attached to the stairs. Fix any  carpet that is loose or worn. Avoid having throw rugs at the top or bottom of the stairs. If you do have throw rugs, attach them to the floor with carpet tape. Make sure that you have a light switch at the top of the stairs and the bottom of the stairs. If you do not have them, ask someone to add them for you. What else can I do to help prevent falls? Wear shoes that: Do not have high heels. Have rubber bottoms. Are comfortable and fit you well. Are closed at the toe. Do not wear sandals. If you use a stepladder: Make sure that it is fully opened. Do not climb a closed stepladder. Make sure that both sides of the stepladder  are locked into place. Ask someone to hold it for you, if possible. Clearly mark and make sure that you can see: Any grab bars or handrails. First and last steps. Where the edge of each step is. Use tools that help you move around (mobility aids) if they are needed. These include: Canes. Walkers. Scooters. Crutches. Turn on the lights when you go into a dark area. Replace any light bulbs as soon as they burn out. Set up your furniture so you have a clear path. Avoid moving your furniture around. If any of your floors are uneven, fix them. If there are any pets around you, be aware of where they are. Review your medicines with your doctor. Some medicines can make you feel dizzy. This can increase your chance of falling. Ask your doctor what other things that you can do to help prevent falls. This information is not intended to replace advice given to you by your health care provider. Make sure you discuss any questions you have with your health care provider. Document Released: 04/07/2009 Document Revised: 11/17/2015 Document Reviewed: 07/16/2014 Elsevier Interactive Patient Education  2017 [employer].

## 2021-12-04 ENCOUNTER — Encounter: Payer: Self-pay | Admitting: Nurse Practitioner

## 2021-12-06 ENCOUNTER — Other Ambulatory Visit: Payer: Self-pay

## 2021-12-06 ENCOUNTER — Telehealth: Payer: Self-pay

## 2021-12-06 DIAGNOSIS — E119 Type 2 diabetes mellitus without complications: Secondary | ICD-10-CM

## 2021-12-06 MED ORDER — FREESTYLE LIBRE 2 SENSOR MISC
1 refills | Status: DC
Start: 2021-12-06 — End: 2022-02-02

## 2021-12-06 MED ORDER — LISINOPRIL 10 MG PO TABS: 10.0000 mg | ORAL_TABLET | Freq: Every morning | ORAL | 0 refills | Status: DC

## 2021-12-06 NOTE — Chronic Care Management (AMB) (Signed)
Chronic Care Management Pharmacy Assistant   Name: Shannon [name]  MRN: [id number] DOB: 1955-11-17   Reason for Encounter: Medication Review/ Medication coordination  Recent office visits:  11-30-2021 Kellie Simmering, LPN. Medicare annual wellness visit.  11-21-2021 [name], [place]. Toradol and kenalog injection for left hip pain.  Recent consult visits:  11-28-2021 [place], [place] [facility]). Visit for lower back pain. MRI, Lumbar Spine, W/o Contrast and  XR, Lumbar Spine completed.  Hospital visits:  Medication Reconciliation was completed by comparing discharge summary, patient's EMR and Pharmacy list, and upon discussion with patient.   [facility] on 06/27/2021 due to Acute viral pharyngitis. Discharge date was 06/27/2021.     New?Medications Started at [facility] Discharge:?? Benzonatate 100 mg Oral 3 times daily PRN due to Acute viral pharyngitis   Medication Changes at Hospital Discharge: None   Medications Discontinued at Hospital Discharge: None   Medications that remain the same after Hospital Discharge:??  -All other medications will remain the same.      Medications: Outpatient Encounter Medications as of 12/06/2021  Medication Sig   atorvastatin (LIPITOR) 20 MG tablet TAKE ONE TABLET BY MOUTH EVERY EVENING with A meal   blood glucose meter kit and supplies KIT Dispense based on patient and insurance preference. Use 3 times daily as directed to check blood sugar. Dx code e11.65   Blood Glucose Monitoring Suppl (ONE TOUCH ULTRA MINI) w/Device KIT 1 each by Does not apply route 4 (four) times daily -  before meals and at bedtime.   Cholecalciferol (VITAMIN D-3 PO) Take 1 capsule by mouth daily. 2000 units   Continuous Blood Gluc Receiver (FREESTYLE LIBRE 14 DAY READER) DEVI USE TO CHECK BLOOD SUGARS FOUR TIMES A DAY DX:E11.65   Continuous Blood Gluc Sensor (FREESTYLE LIBRE 2 SENSOR) MISC CHANGE sensor every 14 DAYS   gabapentin (NEURONTIN) 300  MG capsule Take 300 mg by mouth 3 (three) times daily. Take at bedtime for 3 days, take twice daily 3 days, then take 3 times a day   glucagon (GLUCAGEN HYPOKIT) 1 MG SOLR injection Inject 1 mg into the vein once as needed for up to 1 dose for low blood sugar.   glucose blood (FREESTYLE TEST STRIPS) test strip Use as instructed   glucose blood (FREESTYLE TEST STRIPS) test strip Use as instructed   glucose blood test strip Use as instructed   glucose blood test strip Use as instructed to check blood sugar 3 times a day. Dx code e11.65   insulin degludec (TRESIBA FLEXTOUCH) 100 UNIT/ML FlexTouch Pen Inject 15 Units into the skin at bedtime.   Lancets (ONETOUCH ULTRASOFT) lancets Use as instructed to check blood sugars 4 times daily E11.9   lisinopril (ZESTRIL) 10 MG tablet TAKE ONE TABLET BY MOUTH EVERY MORNING   meloxicam (MOBIC) 15 MG tablet Take 15 mg by mouth daily. Take 1 daily for 28 days   metFORMIN (GLUCOPHAGE XR) 750 MG 24 hr tablet Take 1 tablet (750 mg total) by mouth daily with breakfast.   methocarbamol (ROBAXIN) 500 MG tablet Take 500 mg by mouth 4 (four) times daily. For 5 days   Semaglutide,0.25 or 0.5MG/DOS, (OZEMPIC, 0.25 OR 0.5 MG/DOSE,) 2 MG/1.5ML SOPN Inject 0.5 mg into the skin once a week.   No facility-administered encounter medications on file as of 12/06/2021.   Reviewed chart for medication changes ahead of medication coordination call.  No hospital visits since last care coordination call/Pharmacist visit.  No medication changes indicated  BP Readings from Last 3 Encounters:  11/30/21 122/60  11/21/21 126/64  09/28/21 118/62    Lab Results  Component Value Date   HGBA1C 6.8 (H) 09/28/2021     Patient obtains medications through Adherence Packaging  30 Days   Last adherence delivery included:  Tresiba 15 units at bed time Lisinopril 10 mg 1 tablet daily (breakfast) Vitamin D3 50 mcg - 1 capsule daily (evening meal) Atorvastatin 20 mg 1 tablet daily  (evening meal) Metformin 750 mg 1 tablet daily (breakfast) Freestyle Libre Kit 2 Sensors- Use as directed every 14 days  Patient declined (meds) last month: None  Patient is due for next adherence delivery on: 12-18-2021  Called patient and reviewed medications and coordinated delivery.  This delivery to include: Freestyle Libre Kit 2 Sensors- Use as directed every 14 days Lisinopril 10 mg 1 tablet daily (breakfast) Vitamin D3 50 mcg - 1 capsule daily (evening meal) Atorvastatin 20 mg 1 tablet daily (evening meal) Metformin 750 mg 1 tablet daily (breakfast) Tresiba 15 units at bed time  No acute/short fill needed  Patient declined the following medications: None  Patient needs refills for: request sent Freestyle Libre  Lisinopril   Confirmed delivery date of 12-18-2021 advised patient that pharmacy will contact them the morning of delivery.   [place] Pharmacist Assistant [phone]

## 2021-12-13 ENCOUNTER — Ambulatory Visit (INDEPENDENT_AMBULATORY_CARE_PROVIDER_SITE_OTHER): Payer: HMO | Admitting: Ophthalmology

## 2021-12-13 ENCOUNTER — Encounter (INDEPENDENT_AMBULATORY_CARE_PROVIDER_SITE_OTHER): Payer: Self-pay | Admitting: Ophthalmology

## 2021-12-13 DIAGNOSIS — G4730 Sleep apnea, unspecified: Secondary | ICD-10-CM | POA: Diagnosis not present

## 2021-12-13 DIAGNOSIS — E113412 Type 2 diabetes mellitus with severe nonproliferative diabetic retinopathy with macular edema, left eye: Secondary | ICD-10-CM

## 2021-12-13 DIAGNOSIS — E113411 Type 2 diabetes mellitus with severe nonproliferative diabetic retinopathy with macular edema, right eye: Secondary | ICD-10-CM

## 2021-12-13 MED ORDER — BEVACIZUMAB 2.5 MG/0.1ML IZ SOSY
2.5000 mg | PREFILLED_SYRINGE | INTRAVITREAL | Status: AC | PRN
  Administered 2021-12-13: 2.5 mg via INTRAVITREAL

## 2021-12-13 NOTE — Assessment & Plan Note (Signed)
Patient continues to be compliant with CPAP usage.

## 2021-12-13 NOTE — Assessment & Plan Note (Signed)
Slight increase in CSME, will continue to monitor closely dilate OU next

## 2021-12-13 NOTE — Progress Notes (Signed)
12/13/2021     CHIEF COMPLAINT Patient presents for  Chief Complaint  Patient presents with   Diabetic Retinopathy with Macular Edema      HISTORY OF PRESENT ILLNESS: Shannon [name] is a 66 y.o. female who presents to the clinic today for:   HPI   7 weeks dilate OS, Avastin OS OCT. Patient states vision is stable and unchanged since last visit. Denies any new floaters or FOL. LBS: 124 this morning. Last edited by Laurin Coder on 12/13/2021  9:00 AM.      Referring physician: [name], [address] [place],  [place] [address]  HISTORICAL INFORMATION:   Selected notes from the MEDICAL RECORD NUMBER    Lab Results  Component Value Date   HGBA1C 6.8 (H) 09/28/2021     CURRENT MEDICATIONS: No current outpatient medications on file. (Ophthalmic Drugs)   No current facility-administered medications for this visit. (Ophthalmic Drugs)   Current Outpatient Medications (Other)  Medication Sig   atorvastatin (LIPITOR) 20 MG tablet TAKE ONE TABLET BY MOUTH EVERY EVENING with A meal   blood glucose meter kit and supplies KIT Dispense based on patient and insurance preference. Use 3 times daily as directed to check blood sugar. Dx code e11.65   Blood Glucose Monitoring Suppl (ONE TOUCH ULTRA MINI) w/Device KIT 1 each by Does not apply route 4 (four) times daily -  before meals and at bedtime.   Cholecalciferol (VITAMIN D-3 PO) Take 1 capsule by mouth daily. 2000 units   Continuous Blood Gluc Receiver (FREESTYLE LIBRE 14 DAY READER) DEVI USE TO CHECK BLOOD SUGARS FOUR TIMES A DAY DX:E11.65   Continuous Blood Gluc Sensor (FREESTYLE LIBRE 2 SENSOR) MISC CHANGE sensor every 14 DAYS   gabapentin (NEURONTIN) 300 MG capsule Take 300 mg by mouth 3 (three) times daily. Take at bedtime for 3 days, take twice daily 3 days, then take 3 times a day   glucagon (GLUCAGEN HYPOKIT) 1 MG SOLR injection Inject 1 mg into the vein once as needed for up to 1 dose for low blood  sugar.   glucose blood (FREESTYLE TEST STRIPS) test strip Use as instructed   glucose blood (FREESTYLE TEST STRIPS) test strip Use as instructed   glucose blood test strip Use as instructed   glucose blood test strip Use as instructed to check blood sugar 3 times a day. Dx code e11.65   insulin degludec (TRESIBA FLEXTOUCH) 100 UNIT/ML FlexTouch Pen Inject 15 Units into the skin at bedtime.   Lancets (ONETOUCH ULTRASOFT) lancets Use as instructed to check blood sugars 4 times daily E11.9   lisinopril (ZESTRIL) 10 MG tablet Take 1 tablet (10 mg total) by mouth every morning.   meloxicam (MOBIC) 15 MG tablet Take 15 mg by mouth daily. Take 1 daily for 28 days   metFORMIN (GLUCOPHAGE XR) 750 MG 24 hr tablet Take 1 tablet (750 mg total) by mouth daily with breakfast.   methocarbamol (ROBAXIN) 500 MG tablet Take 500 mg by mouth 4 (four) times daily. For 5 days   Semaglutide,0.25 or 0.5MG/DOS, (OZEMPIC, 0.25 OR 0.5 MG/DOSE,) 2 MG/1.5ML SOPN Inject 0.5 mg into the skin once a week.   No current facility-administered medications for this visit. (Other)      REVIEW OF SYSTEMS: ROS   Negative for: Constitutional, Gastrointestinal, Neurological, Skin, Genitourinary, Musculoskeletal, HENT, Endocrine, Cardiovascular, Eyes, Respiratory, Psychiatric, Allergic/Imm, Heme/Lymph Last edited by [name], MD on 12/13/2021  9:17 AM.  ALLERGIES No Known Allergies  PAST MEDICAL HISTORY Past Medical History:  Diagnosis Date   COVID-19 virus infection 10/13/2018   Diabetes mellitus without complication ([place])    Hypercholesteremia    Sleep apnea    Newly diagnosed in September 2021   Past Surgical History:  Procedure Laterality Date   ABDOMINAL HYSTERECTOMY     CATARACT EXTRACTION Bilateral    CESAREAN SECTION     MYOMECTOMY      FAMILY HISTORY Family History  Problem Relation Age of Onset   Hypertension Mother    Diabetes Mother    Stroke Mother    Diabetes Father    Hypertension  Father    Stroke Father    Hypertension Son    Diabetes Son    Breast cancer Neg Hx     SOCIAL HISTORY Social History   Tobacco Use   Smoking status: Former    Types: Cigarettes    Quit date: 2006    Years since quitting: 17.4   Smokeless tobacco: Never   Tobacco comments:    quit 10 yrs  Vaping Use   Vaping Use: Never used  Substance Use Topics   Alcohol use: Yes    Comment: 1 glass of wine sometimes daily    Drug use: Never         OPHTHALMIC EXAM:  Base Eye Exam     Visual Acuity (ETDRS)       Right Left   Dist [place] 20/25 -1 20/20 -2         Tonometry (Tonopen, 9:03 AM)       Right Left   Pressure 13 13         Pupils       Pupils Dark Light Shape React APD   Right PERRL 2 2 Round Minimal None   Left PERRL 2 2 Round Minimal None         Extraocular Movement       Right Left    Full Full         Neuro/Psych     Oriented x3: Yes   Mood/Affect: Normal         Dilation     Left eye: 1.0% Mydriacyl, 2.5% Phenylephrine @ 9:03 AM           Slit Lamp and Fundus Exam     External Exam       Right Left   External Normal Normal         Slit Lamp Exam       Right Left   Lids/Lashes Normal Normal   Conjunctiva/Sclera White and quiet White and quiet   Cornea Clear Clear   Anterior Chamber Deep and quiet Deep and quiet   Iris Round and reactive Round and reactive   Lens Posterior chamber intraocular lens Posterior chamber intraocular lens   Anterior Vitreous Normal Normal         Fundus Exam       Right Left   Posterior Vitreous  Normal, small vitreous debris, no cells, no overt PVD   Disc  Normal, posterior pole cotton-wool spots noted   C/D Ratio  0.2   Macula  Microaneurysms, no detectable clinically significant macular edema   Vessels  NPDR-Severe   Periphery  Normal            IMAGING AND PROCEDURES  Imaging and Procedures for 12/13/21  OCT, Retina - OU - Both Eyes       Right Eye Quality  was  good. Scan locations included subfoveal. Central Foveal Thickness: 290. Progression has worsened. Findings include abnormal foveal contour.   Left Eye Quality was good. Scan locations included subfoveal. Central Foveal Thickness: 285. Progression has improved. Findings include abnormal foveal contour.   Notes CSME OD, now some 9 months status post most recent injection Avastin concomitantly patient is using CPAP effectively and compliantly thus less nightly macular hypoxia   OS with much less thickening inferior portion of the FAZ extending into the center involvement post resumption of Avastin therapy March 2023.  Will need repeat and resume injection Avastin OS only today, and reevaluate in 7 to 8 weeks     Intravitreal Injection, Pharmacologic Agent - OS - Left Eye       Time Out 12/13/2021. 9:21 AM. Confirmed correct patient, procedure, site, and patient consented.   Anesthesia Topical anesthesia was used. Anesthetic medications included Lidocaine 4%.   Procedure Preparation included 5% betadine to ocular surface, 10% betadine to eyelids, Tobramycin 0.3%. A 30 gauge needle was used.   Injection: 2.5 mg bevacizumab 2.5 MG/0.1ML   Route: Intravitreal, Site: Left Eye   NDC: [phone], Lot: [id number], Expiration date: 01/21/2022   Post-op Post injection exam found visual acuity of at least counting fingers. The patient tolerated the procedure well. There were no complications. The patient received written and verbal post procedure care education. Post injection medications were not given.              ASSESSMENT/PLAN:  Severe nonproliferative diabetic retinopathy of left eye, with macular edema, associated with type 2 diabetes mellitus (HCC) Improved CSME now post Avastin No. 2 for recurrence of CSME onset March 2020 3 repeat today at 7 weeks.  Severe nonproliferative diabetic retinopathy of right eye, with macular edema, associated with type 2 diabetes mellitus  (HCC) Slight increase in CSME, will continue to monitor closely dilate OU next  Sleep apnea in adult Patient continues to be compliant with CPAP usage     ICD-10-CM   1. Severe nonproliferative diabetic retinopathy of left eye, with macular edema, associated with type 2 diabetes mellitus (HCC)  I32.5498 OCT, Retina - OU - Both Eyes    Intravitreal Injection, Pharmacologic Agent - OS - Left Eye    bevacizumab (AVASTIN) SOSY 2.5 mg    2. Severe nonproliferative diabetic retinopathy of right eye, with macular edema, associated with type 2 diabetes mellitus ([place])  E11.3411     3. Sleep apnea in adult  G47.30       1.  OS improving CSME thus effective Avastin usage at 7-week follow-up today.  Repeat injection today to maintain extend interval examination next however OD needs evaluation next as well  2.  Slight increase in thickening right eye today good acuity  3.  Ophthalmic Meds Ordered this visit:  Meds ordered this encounter  Medications   bevacizumab (AVASTIN) SOSY 2.5 mg       Return in about 8 weeks (around 02/07/2022) for DILATE OU, OPTOS FFA R/L, COLOR FP, AVASTIN OCT, OS, possible OD.  There are no Patient Instructions on file for this visit.   Explained the diagnoses, plan, and follow up with the patient and they expressed understanding.  Patient expressed understanding of the importance of proper follow up care.   Clent Demark Oshay Stranahan M.D. Diseases & Surgery of the Retina and Vitreous [facility]ina [facility] Di[facility] [place] 12/13/21     Abbreviations: M myopia (nearsighted); A astigmatism; H hyperopia (farsighted); P presbyopia; Mrx spectacle prescription;  CTL contact lenses; OD right eye; OS left eye; OU both eyes  XT exotropia; ET esotropia; PEK punctate epithelial keratitis; PEE punctate epithelial erosions; DES dry eye syndrome; MGD meibomian gland dysfunction; ATs artificial tears; PFAT's preservative free artificial tears; [place] nuclear sclerotic cataract; PSC posterior  subcapsular cataract; ERM epi-retinal membrane; PVD posterior vitreous detachment; RD retinal detachment; DM diabetes mellitus; DR diabetic retinopathy; NPDR non-proliferative diabetic retinopathy; PDR proliferative diabetic retinopathy; CSME clinically significant macular edema; DME diabetic macular edema; dbh dot blot hemorrhages; CWS cotton wool spot; POAG primary open angle glaucoma; C/D cup-to-disc ratio; HVF humphrey visual field; GVF goldmann visual field; OCT optical coherence tomography; IOP intraocular pressure; BRVO Branch retinal vein occlusion; CRVO central retinal vein occlusion; CRAO central retinal artery occlusion; BRAO branch retinal artery occlusion; RT retinal tear; SB scleral buckle; PPV pars plana vitrectomy; VH Vitreous hemorrhage; PRP panretinal laser photocoagulation; IVK intravitreal kenalog; VMT vitreomacular traction; MH Macular hole;  NVD neovascularization of the disc; NVE neovascularization elsewhere; AREDS age related eye disease study; ARMD age related macular degeneration; POAG primary open angle glaucoma; EBMD epithelial/anterior basement membrane dystrophy; ACIOL anterior chamber intraocular lens; IOL intraocular lens; PCIOL posterior chamber intraocular lens; Phaco/IOL phacoemulsification with intraocular lens placement; [place] photorefractive keratectomy; LASIK laser assisted in situ keratomileusis; HTN hypertension; DM diabetes mellitus; COPD chronic obstructive pulmonary disease

## 2021-12-13 NOTE — Assessment & Plan Note (Signed)
Improved CSME now post Avastin No. 2 for recurrence of CSME onset March 2020 3 repeat today at 7 weeks.

## 2021-12-21 ENCOUNTER — Ambulatory Visit: Payer: HMO | Admitting: Family Medicine

## 2021-12-21 ENCOUNTER — Encounter: Payer: Self-pay | Admitting: Family Medicine

## 2021-12-21 VITALS — BP 158/82 | HR 77 | Ht 62.4 in | Wt 155.5 lb

## 2021-12-21 DIAGNOSIS — Z9989 Dependence on other enabling machines and devices: Secondary | ICD-10-CM

## 2021-12-21 DIAGNOSIS — G4733 Obstructive sleep apnea (adult) (pediatric): Secondary | ICD-10-CM | POA: Diagnosis not present

## 2021-12-21 NOTE — Progress Notes (Signed)
CM sent to [facility] for new order ?

## 2021-12-21 NOTE — Patient Instructions (Signed)
Please continue using your CPAP regularly. While your insurance requires that you use CPAP at least 4 hours each night on 70% of the nights, I recommend, that you not skip any nights and use it throughout the night if you can. Getting used to CPAP and staying with the treatment long term does take time and patience and discipline. Untreated obstructive sleep apnea when it is moderate to severe can have an adverse impact on cardiovascular health and raise her risk for heart disease, arrhythmias, hypertension, congestive heart failure, stroke and diabetes. Untreated obstructive sleep apnea causes sleep disruption, nonrestorative sleep, and sleep deprivation. This can have an impact on your day to day functioning and cause daytime sleepiness and impairment of cognitive function, memory loss, mood disturbance, and problems focussing. Using CPAP regularly can improve these symptoms.   Please continue close follow up with PCP and ortho for back pain.   I will recheck download in 6 weeks. I will shoot you a message once I review it   Follow up with me in 6 months.

## 2021-12-21 NOTE — Progress Notes (Signed)
PATIENT: Shannon [name] DOB: [birth year]  REASON FOR VISIT: follow up HISTORY FROM: patient  Chief Complaint  Patient presents with   Follow-up    Rm 1, alone. Here for CPAP f/u. Pt reports working night shifts. Has been doing ok.      HISTORY OF PRESENT ILLNESS:  12/21/21 ALL:  Shannon [name] returns for follow up for OSA on CPAP. She reports that she is having difficulty with compliance. 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 She admits that she has not put therapy on when she gets home. She is napping during the day. She denies difficulty with machine or supplies. She is having significant back pain with shooting pain in left left. She is followed by PCP and ortho.     12/15/2020 ALL: Shannon [name] is a 66 y.o. female here today for follow up for OSA on CPAP. She is doing well with new mask. She has not had any difficulty using CPAP. She does feel a little better rested in the mornings.        08/29/2020 SA: Shannon [name] is a 66 year old right-handed woman with an underlying medical history of hypertension, hyperlipidemia, diabetes, diabetic retinopathy and overweight state, who presents for follow-up consultation of her obstructive sleep apnea after interim testing and starting AutoPap therapy.  The patient is unaccompanied today.  I first met her at the request of her primary care nurse practitioner on 01/06/2020, at which time she reported snoring and excessive daytime somnolence.  She was encouraged to seek evaluation for sleep apnea by her retina specialist who had noted macular edema and felt it could be in part secondary to underlying sleep apnea.  She was advised to proceed with sleep testing.  She had a home sleep test on 03/02/2020 which indicated overall mild sleep apnea with an AHI of 7.7/h, O2 nadir of 89%.  Given her medical history and daytime somnolence reported she was encouraged to start AutoPap therapy.  Set up date was 05/10/20.   Today, 08/29/20: I reviewed her AutoPap  compliance data from 07/26/2020 through 08/24/2020, which is a total of 30 days, during which time she used her machine only 14 days with percent use days greater than 4 hours at only 3%, indicating low compliance, average AHI 11.6/h, 95th percentile of pressure at 5.5 cm, leak for the 95th percentile at 13.7 L/min, pressure range of 5 to 11 cm with EPR of 3.   I reviewed her AutoPap compliance data from 06/26/2020 through 07/25/2020, which is a total of 30 days, during which time she used her machine 24 days with percent use days greater than 4 hours at 40%, indicating suboptimal compliance with an average usage of 2 hours and 50 minutes for days on treatment, residual AHI 4.2/h, 95th percentile of pressure at 8.4 cm with a range of 5 to 11 cm, leak on the higher side with a 95th percentile at 17.5 L/min.  Her compliance in the month of late November through late December was similar, percent use days greater than 4 hours was 33%.  She reports that she works third shift.  She tries to keep the mask on at least 4 hours daily.  She generally sleeps between 1 and 6 PM.  She has to be at work at 7 PM and works 12 hours.  She reports that she feels a little better, sleep quality and energy are a little better.  She reports that she had an eye examination and her  right eye looks a little better.  I reviewed the note from Dr. Deloria Lair from 08/17/2020.  She is motivated to continue with treatment.  She reports using a nasal mask.  She does breathe at times through her mouth and is wondering if she could try a full facemask.  She talked to [facility] regarding her low readings messages on her machine and she was instructed to troubleshoot at home by unplugging and restarting.  She still gets the low readings.   REVIEW OF SYSTEMS: Out of a complete 14 system review of symptoms, the patient complains only of the following symptoms, and all other reviewed systems are negative.  ESS: 4  ALLERGIES: No Known Allergies  HOME  MEDICATIONS: Outpatient Medications Prior to Visit  Medication Sig Dispense Refill   atorvastatin (LIPITOR) 20 MG tablet TAKE ONE TABLET BY MOUTH EVERY EVENING with A meal 90 tablet 1   blood glucose meter kit and supplies KIT Dispense based on patient and insurance preference. Use 3 times daily as directed to check blood sugar. Dx code e11.65 1 each 9   Blood Glucose Monitoring Suppl (ONE TOUCH ULTRA MINI) w/Device KIT 1 each by Does not apply route 4 (four) times daily -  before meals and at bedtime. 1 kit 0   Cholecalciferol (VITAMIN D-3 PO) Take 1 capsule by mouth daily. 2000 units     Continuous Blood Gluc Receiver (FREESTYLE LIBRE 14 DAY READER) DEVI USE TO CHECK BLOOD SUGARS FOUR TIMES A DAY DX:E11.65 1 each 1   Continuous Blood Gluc Sensor (FREESTYLE LIBRE 2 SENSOR) MISC CHANGE sensor every 14 DAYS 2 each 1   gabapentin (NEURONTIN) 300 MG capsule Take 300 mg by mouth 3 (three) times daily. Take at bedtime for 3 days, take twice daily 3 days, then take 3 times a day     glucagon (GLUCAGEN HYPOKIT) 1 MG SOLR injection Inject 1 mg into the vein once as needed for up to 1 dose for low blood sugar. 1 each 5   glucose blood (FREESTYLE TEST STRIPS) test strip Use as instructed 100 each 12   glucose blood (FREESTYLE TEST STRIPS) test strip Use as instructed 100 each 12   glucose blood test strip Use as instructed 100 each 5   glucose blood test strip Use as instructed to check blood sugar 3 times a day. Dx code e11.65 100 each 12   insulin degludec (TRESIBA FLEXTOUCH) 100 UNIT/ML FlexTouch Pen Inject 15 Units into the skin at bedtime. 15 mL 1   Lancets (ONETOUCH ULTRASOFT) lancets Use as instructed to check blood sugars 4 times daily E11.9 300 each 3   lisinopril (ZESTRIL) 10 MG tablet Take 1 tablet (10 mg total) by mouth every morning. 90 tablet 0   meloxicam (MOBIC) 15 MG tablet Take 15 mg by mouth daily. Take 1 daily for 28 days     metFORMIN (GLUCOPHAGE XR) 750 MG 24 hr tablet Take 1 tablet  (750 mg total) by mouth daily with breakfast. 90 tablet 1   methocarbamol (ROBAXIN) 500 MG tablet Take 500 mg by mouth 4 (four) times daily. For 5 days     Semaglutide,0.25 or 0.5MG/DOS, (OZEMPIC, 0.25 OR 0.5 MG/DOSE,) 2 MG/1.5ML SOPN Inject 0.5 mg into the skin once a week. 4.5 mL 1   No facility-administered medications prior to visit.    PAST MEDICAL HISTORY: Past Medical History:  Diagnosis Date   COVID-19 virus infection 10/13/2018   Diabetes mellitus without complication ([place])    Hypercholesteremia  Sleep apnea    Newly diagnosed in September 2021    PAST SURGICAL HISTORY: Past Surgical History:  Procedure Laterality Date   ABDOMINAL HYSTERECTOMY     CATARACT EXTRACTION Bilateral    CESAREAN SECTION     MYOMECTOMY      FAMILY HISTORY: Family History  Problem Relation Age of Onset   Hypertension Mother    Diabetes Mother    Stroke Mother    Diabetes Father    Hypertension Father    Stroke Father    Hypertension Son    Diabetes Son    Breast cancer Neg Hx     SOCIAL HISTORY: Social History   Socioeconomic History   Marital status: Single    Spouse name: Not on file   Number of children: Not on file   Years of education: Not on file   Highest education level: Not on file  Occupational History   Not on file  Tobacco Use   Smoking status: Former    Types: Cigarettes    Quit date: 2006    Years since quitting: 17.5   Smokeless tobacco: Never   Tobacco comments:    quit 10 yrs  Vaping Use   Vaping Use: Never used  Substance and Sexual Activity   Alcohol use: Yes    Comment: 1 glass of wine sometimes daily    Drug use: Never   Sexual activity: Not on file  Other Topics Concern   Not on file  Social History Narrative   Not on file   Social Determinants of Health   Financial Resource Strain: Low Risk  (11/30/2021)   Overall Financial Resource Strain (CARDIA)    Difficulty of Paying Living Expenses: Not hard at all  Food Insecurity: No Food  Insecurity (11/30/2021)   Hunger Vital Sign    Worried About Running Out of Food in the Last Year: Never true    Ran Out of Food in the Last Year: Never true  Transportation Needs: No Transportation Needs (11/30/2021)   PRAPARE - Hydrologist (Medical): No    Lack of Transportation (Non-Medical): No  Physical Activity: Sufficiently Active (11/30/2021)   Exercise Vital Sign    Days of Exercise per Week: 4 days    Minutes of Exercise per Session: 50 min  Stress: No Stress Concern Present (11/30/2021)   [place]    Feeling of Stress : Not at all  Social Connections: Not on file  Intimate Partner Violence: Not on file     PHYSICAL EXAM  Vitals:   12/21/21 1423  BP: (!) 158/82  Pulse: 77  Weight: 155 lb 8 oz (70.5 kg)  Height: 5' 2.4" (1.585 m)   Body mass index is 28.08 kg/m.  Generalized: Well developed, in no acute distress  Cardiology: normal rate and rhythm, no murmur noted Respiratory: clear to auscultation bilaterally  Neurological examination  Mentation: Alert oriented to time, place, history taking. Follows all commands speech and language fluent Cranial nerve II-XII: Pupils were equal round reactive to light. Extraocular movements were full, visual field were full  Motor: The motor testing reveals 5 over 5 strength of all 4 extremities. Good symmetric motor tone is noted throughout.  Gait and station: Gait is guarded, left limp, stable without assistive device    DIAGNOSTIC DATA (LABS, IMAGING, TESTING) - I reviewed patient records, labs, notes, testing and imaging myself where available.      No data  to display           Lab Results  Component Value Date   WBC 3.6 01/18/2021   HGB 11.3 01/18/2021   HCT 35.0 01/18/2021   MCV 86 01/18/2021   PLT 167 01/18/2021      Component Value Date/Time   NA 142 09/28/2021 1221   K 4.4 09/28/2021 1221   CL 102 09/28/2021 1221    CO2 26 09/28/2021 1221   GLUCOSE 98 09/28/2021 1221   GLUCOSE 183 (H) 10/18/2018 0550   BUN 18 09/28/2021 1221   CREATININE 0.53 (L) 09/28/2021 1221   CALCIUM 9.5 09/28/2021 1221   PROT 6.6 06/22/2021 1443   ALBUMIN 4.7 06/22/2021 1443   AST 22 06/22/2021 1443   ALT 30 06/22/2021 1443   ALKPHOS 88 06/22/2021 1443   BILITOT 0.3 06/22/2021 1443   GFRNONAA 93 05/23/2020 1139   GFRAA 107 05/23/2020 1139   Lab Results  Component Value Date   CHOL 109 09/28/2021   HDL 58 09/28/2021   LDLCALC 34 09/28/2021   TRIG 88 09/28/2021   CHOLHDL 1.9 09/28/2021   Lab Results  Component Value Date   HGBA1C 6.8 (H) 09/28/2021   Lab Results  Component Value Date   VITAMINB12 1,789 (H) 09/28/2021   Lab Results  Component Value Date   TSH 0.587 09/28/2021     ASSESSMENT AND PLAN 66 y.o. year old female  has a past medical history of COVID-19 virus infection (10/13/2018), Diabetes mellitus without complication ([place]), Hypercholesteremia, and Sleep apnea. here with     ICD-10-CM   1. OSA on CPAP  G47.33 For home use only DME continuous positive airway pressure (CPAP)   Z99.89        EVOLETT SOMARRIBA is doing well on CPAP therapy. Compliance report reveals suboptimal compliance. She feels this is related to working nights and vacations. I will repeat download in 6 weeks to assess compliance. She was encouraged to continue using CPAP nightly and for greater than 4 hours each night. We will update supply orders as indicated. Risks of untreated sleep apnea review and education materials provided.She was encouraged to follow up closely with PCP and ortho for current back pain. Healthy lifestyle habits encouraged. She will follow up in 6 months, sooner if needed. She  verbalizes understanding and agreement with this plan.    Orders Placed This Encounter  Procedures   For home use only DME continuous positive airway pressure (CPAP)    Supplies    Order Specific Question:   Length of Need     Answer:   Lifetime    Order Specific Question:   Patient has OSA or probable OSA    Answer:   Yes    Order Specific Question:   Is the patient currently using CPAP in the home    Answer:   Yes    Order Specific Question:   Settings    Answer:   Other see comments    Order Specific Question:   CPAP supplies needed    Answer:   Mask, headgear, cushions, filters, heated tubing and water chamber      No orders of the defined types were placed in this encounter.      Debbora Presto, FNP-C 12/21/2021, 2:49 PM [facility] [address] [place], [place] [address] [phone]

## 2021-12-27 ENCOUNTER — Ambulatory Visit: Payer: HMO | Admitting: Nurse Practitioner

## 2021-12-27 DIAGNOSIS — G4733 Obstructive sleep apnea (adult) (pediatric): Secondary | ICD-10-CM | POA: Diagnosis not present

## 2021-12-31 DIAGNOSIS — M5451 Vertebrogenic low back pain: Secondary | ICD-10-CM | POA: Diagnosis not present

## 2022-01-04 ENCOUNTER — Telehealth: Payer: Self-pay

## 2022-01-04 NOTE — Chronic Care Management (AMB) (Signed)
-   Chronic Care Management Pharmacy Assistant   Name: BRITTIANY WIEHE  MRN: [id number] DOB: May 26, 1956  Reason for Encounter: Medication Review/ Medication coordination  Recent office visits:  None  Recent consult visits:  12-21-2021 Debbora Presto, NP (Neurology). Follow up visit no changes.  12-13-2021 Rankin, Clent Demark, MD (Ophthalmology). Avastin injection given in left eye. OCT, Retina - OU - Both Eyes ordered.  Hospital visits:  None in previous 6 months  Medications: Outpatient Encounter Medications as of 01/04/2022  Medication Sig   atorvastatin (LIPITOR) 20 MG tablet TAKE ONE TABLET BY MOUTH EVERY EVENING with A meal   blood glucose meter kit and supplies KIT Dispense based on patient and insurance preference. Use 3 times daily as directed to check blood sugar. Dx code e11.65   Blood Glucose Monitoring Suppl (ONE TOUCH ULTRA MINI) w/Device KIT 1 each by Does not apply route 4 (four) times daily -  before meals and at bedtime.   Cholecalciferol (VITAMIN D-3 PO) Take 1 capsule by mouth daily. 2000 units   Continuous Blood Gluc Receiver (FREESTYLE LIBRE 14 DAY READER) DEVI USE TO CHECK BLOOD SUGARS FOUR TIMES A DAY DX:E11.65   Continuous Blood Gluc Sensor (FREESTYLE LIBRE 2 SENSOR) MISC CHANGE sensor every 14 DAYS   gabapentin (NEURONTIN) 300 MG capsule Take 300 mg by mouth 3 (three) times daily. Take at bedtime for 3 days, take twice daily 3 days, then take 3 times a day   glucagon (GLUCAGEN HYPOKIT) 1 MG SOLR injection Inject 1 mg into the vein once as needed for up to 1 dose for low blood sugar.   glucose blood (FREESTYLE TEST STRIPS) test strip Use as instructed   glucose blood (FREESTYLE TEST STRIPS) test strip Use as instructed   glucose blood test strip Use as instructed   glucose blood test strip Use as instructed to check blood sugar 3 times a day. Dx code e11.65   insulin degludec (TRESIBA FLEXTOUCH) 100 UNIT/ML FlexTouch Pen Inject 15 Units into the skin at bedtime.    Lancets (ONETOUCH ULTRASOFT) lancets Use as instructed to check blood sugars 4 times daily E11.9   lisinopril (ZESTRIL) 10 MG tablet Take 1 tablet (10 mg total) by mouth every morning.   meloxicam (MOBIC) 15 MG tablet Take 15 mg by mouth daily. Take 1 daily for 28 days   metFORMIN (GLUCOPHAGE XR) 750 MG 24 hr tablet Take 1 tablet (750 mg total) by mouth daily with breakfast.   methocarbamol (ROBAXIN) 500 MG tablet Take 500 mg by mouth 4 (four) times daily. For 5 days   Semaglutide,0.25 or 0.5MG/DOS, (OZEMPIC, 0.25 OR 0.5 MG/DOSE,) 2 MG/1.5ML SOPN Inject 0.5 mg into the skin once a week.   No facility-administered encounter medications on file as of 01/04/2022.   Reviewed chart for medication changes ahead of medication coordination call.  No OVs, Consults, or hospital visits since last care coordination call/Pharmacist visit.  No medication changes indicated  BP Readings from Last 3 Encounters:  12/21/21 (!) 158/82  11/30/21 122/60  11/21/21 126/64    Lab Results  Component Value Date   HGBA1C 6.8 (H) 09/28/2021     Patient obtains medications through Adherence Packaging  30 Days   Last adherence delivery included:  Freestyle Libre Kit 2 Sensors- Use as directed every 14 days Lisinopril 10 mg 1 tablet daily (breakfast) Vitamin D3 50 mcg - 1 capsule daily (evening meal) Atorvastatin 20 mg 1 tablet daily (evening meal) Metformin 750 mg 1 tablet daily (breakfast)  Tresiba 15 units at bed time  Patient declined (meds) last month: None  Patient is due for next adherence delivery on: 01-16-2022  Called patient and reviewed medications and coordinated delivery.  This delivery to include: Freestyle Libre Kit 2 Sensors- Use as directed every 14 days Lisinopril 10 mg 1 tablet daily (breakfast) Vitamin D3 50 mcg - 1 capsule daily (evening meal) Atorvastatin 20 mg 1 tablet daily (evening meal) Metformin 750 mg 1 tablet daily (breakfast) Tresiba 15 units at bed time   No acute/short  fill needed  Patient declined the following medications: None  Patient needs refills for: None  Confirmed delivery date of 01-16-2022, advised patient that pharmacy will contact them the morning of delivery.   [place] Pharmacist Assistant [phone]

## 2022-01-08 ENCOUNTER — Other Ambulatory Visit: Payer: Self-pay | Admitting: Nurse Practitioner

## 2022-01-08 DIAGNOSIS — Z1231 Encounter for screening mammogram for malignant neoplasm of breast: Secondary | ICD-10-CM

## 2022-01-10 DIAGNOSIS — M545 Low back pain, unspecified: Secondary | ICD-10-CM | POA: Diagnosis not present

## 2022-01-12 ENCOUNTER — Telehealth: Payer: Self-pay

## 2022-01-12 NOTE — Chronic Care Management (AMB) (Signed)
    Shannon [name] was reminded to have all medications, supplements and any blood glucose and blood pressure readings available for review with Cherylin Mylar, Pharm. D, at her telephone visit on 01-16-2022 at 2:00.    [facility] Clinical Pharmacist Assistant [phone]

## 2022-01-15 ENCOUNTER — Ambulatory Visit
Admission: RE | Admit: 2022-01-15 | Discharge: 2022-01-15 | Disposition: A | Discharge status: Home / Self Care | Payer: PPO | Source: Ambulatory Visit | Attending: Nurse Practitioner | Admitting: Nurse Practitioner

## 2022-01-15 DIAGNOSIS — Z1231 Encounter for screening mammogram for malignant neoplasm of breast: Secondary | ICD-10-CM

## 2022-01-16 ENCOUNTER — Telehealth: Payer: HMO

## 2022-01-16 ENCOUNTER — Telehealth: Payer: Self-pay

## 2022-01-16 NOTE — Chronic Care Management (AMB) (Signed)
Chronic Care Management Pharmacy Assistant   Name: ARDYN FORGE  MRN: [id number] DOB: [birth year]  Reason for Encounter: Disease State/ Diabetes  Recent office visits:  None  Recent consult visits:  01-10-2022 Roselyn Reef, MD ([place]). Follow up visit no changes.  Hospital visits:  None in previous 6 months  Medications: Outpatient Encounter Medications as of 01/16/2022  Medication Sig   atorvastatin (LIPITOR) 20 MG tablet TAKE ONE TABLET BY MOUTH EVERY EVENING with A meal   blood glucose meter kit and supplies KIT Dispense based on patient and insurance preference. Use 3 times daily as directed to check blood sugar. Dx code e11.65   Blood Glucose Monitoring Suppl (ONE TOUCH ULTRA MINI) w/Device KIT 1 each by Does not apply route 4 (four) times daily -  before meals and at bedtime.   Cholecalciferol (VITAMIN D-3 PO) Take 1 capsule by mouth daily. 2000 units   Continuous Blood Gluc Receiver (FREESTYLE LIBRE 14 DAY READER) DEVI USE TO CHECK BLOOD SUGARS FOUR TIMES A DAY DX:E11.65   Continuous Blood Gluc Sensor (FREESTYLE LIBRE 2 SENSOR) MISC CHANGE sensor every 14 DAYS   gabapentin (NEURONTIN) 300 MG capsule Take 300 mg by mouth 3 (three) times daily. Take at bedtime for 3 days, take twice daily 3 days, then take 3 times a day   glucagon (GLUCAGEN HYPOKIT) 1 MG SOLR injection Inject 1 mg into the vein once as needed for up to 1 dose for low blood sugar.   glucose blood (FREESTYLE TEST STRIPS) test strip Use as instructed   glucose blood (FREESTYLE TEST STRIPS) test strip Use as instructed   glucose blood test strip Use as instructed   glucose blood test strip Use as instructed to check blood sugar 3 times a day. Dx code e11.65   insulin degludec (TRESIBA FLEXTOUCH) 100 UNIT/ML FlexTouch Pen Inject 15 Units into the skin at bedtime.   Lancets (ONETOUCH ULTRASOFT) lancets Use as instructed to check blood sugars 4 times daily E11.9   lisinopril (ZESTRIL) 10 MG tablet  Take 1 tablet (10 mg total) by mouth every morning.   meloxicam (MOBIC) 15 MG tablet Take 15 mg by mouth daily. Take 1 daily for 28 days   metFORMIN (GLUCOPHAGE XR) 750 MG 24 hr tablet Take 1 tablet (750 mg total) by mouth daily with breakfast.   methocarbamol (ROBAXIN) 500 MG tablet Take 500 mg by mouth 4 (four) times daily. For 5 days   Semaglutide,0.25 or 0.5MG/DOS, (OZEMPIC, 0.25 OR 0.5 MG/DOSE,) 2 MG/1.5ML SOPN Inject 0.5 mg into the skin once a week.   No facility-administered encounter medications on file as of 01/16/2022.   Recent Relevant Labs: Lab Results  Component Value Date/Time   HGBA1C 6.8 (H) 09/28/2021 12:21 PM   HGBA1C 6.3 (H) 06/22/2021 02:43 PM   MICROALBUR 30 11/17/2020 12:05 PM   MICROALBUR 80 08/11/2019 10:29 AM    Kidney Function Lab Results  Component Value Date/Time   CREATININE 0.53 (L) 09/28/2021 12:21 PM   CREATININE 0.65 06/22/2021 02:43 PM   GFRNONAA 93 05/23/2020 11:39 AM   GFRAA 107 05/23/2020 11:39 AM    Current antihyperglycemic regimen:  Tresiba 15 units nightly Metformin 750 mg daily Ozempic 0.5 mg weekly  What recent interventions/DTPs have been made to improve glycemic control:  Educated on Complications of diabetes including kidney damage, retinal damage, and cardiovascular disease; Exercise goal of 150 minutes per week; -Counseled to check feet daily and get yearly eye exams -Recommended to continue current medication  Have there been any recent hospitalizations or ED visits since last visit with CPP? No  Patient denies hypoglycemic symptoms  Patient denies hyperglycemic symptoms  How often are you checking your blood sugar? once daily  What are your blood sugars ranging?  Fasting: 145, 162, 150 Before meals: None After meals: None Bedtime: None  During the week, how often does your blood glucose drop below 70? Never  Are you checking your feet daily/regularly? Daily  Adherence Review: Is the patient currently on a STATIN  medication? Yes Is the patient currently on ACE/ARB medication? Yes Does the patient have >5 day gap between last estimated fill dates? No  NOTES: Patient stated she is running low on Ozempic. Contacted [employer] and was told application was received and approved on 11-23-2021 but was never processed. Representative processed application today and medication will ship out 10-14 days. Patient confirmed she has 2 pens left. Will contact patient when medication arrives.  Care Gaps: Yearly foot exam AWV 12-05-2022  Star Rating Drugs: Atorvastatin 20 mg- Last filled 01-11-2022 30 [facility] Ozempic 0.5 mg- Patient assistance Metformin 750 mg- Last filled 01-11-2022 30 [facility] Lisinopril 10 mg- Last filled 01-11-2022 30 [facility]  [place] Clinical Pharmacist Assistant [phone]

## 2022-01-23 ENCOUNTER — Ambulatory Visit (INDEPENDENT_AMBULATORY_CARE_PROVIDER_SITE_OTHER): Payer: PPO | Admitting: Nurse Practitioner

## 2022-01-23 ENCOUNTER — Encounter: Payer: Self-pay | Admitting: Nurse Practitioner

## 2022-01-23 VITALS — BP 132/82 | HR 70 | Temp 98.1°F | Ht 62.0 in | Wt 155.8 lb

## 2022-01-23 DIAGNOSIS — Z0000 Encounter for general adult medical examination without abnormal findings: Secondary | ICD-10-CM

## 2022-01-23 DIAGNOSIS — R202 Paresthesia of skin: Secondary | ICD-10-CM

## 2022-01-23 DIAGNOSIS — Z794 Long term (current) use of insulin: Secondary | ICD-10-CM | POA: Diagnosis not present

## 2022-01-23 DIAGNOSIS — M5432 Sciatica, left side: Secondary | ICD-10-CM | POA: Diagnosis not present

## 2022-01-23 DIAGNOSIS — M25552 Pain in left hip: Secondary | ICD-10-CM

## 2022-01-23 DIAGNOSIS — Z79899 Other long term (current) drug therapy: Secondary | ICD-10-CM

## 2022-01-23 DIAGNOSIS — E782 Mixed hyperlipidemia: Secondary | ICD-10-CM | POA: Diagnosis not present

## 2022-01-23 DIAGNOSIS — E113493 Type 2 diabetes mellitus with severe nonproliferative diabetic retinopathy without macular edema, bilateral: Secondary | ICD-10-CM

## 2022-01-23 DIAGNOSIS — M5416 Radiculopathy, lumbar region: Secondary | ICD-10-CM | POA: Diagnosis not present

## 2022-01-23 DIAGNOSIS — R200 Anesthesia of skin: Secondary | ICD-10-CM

## 2022-01-23 DIAGNOSIS — I700 Atherosclerosis of aorta: Secondary | ICD-10-CM

## 2022-01-23 LAB — POCT URINALYSIS DIPSTICK
Bilirubin, UA: NEGATIVE
Blood, UA: NEGATIVE
Glucose, UA: NEGATIVE
Ketones, UA: NEGATIVE
Nitrite, UA: NEGATIVE
Protein, UA: NEGATIVE
Spec Grav, UA: 1.02 (ref 1.010–1.025)
Urobilinogen, UA: 0.2 E.U./dL
pH, UA: 7 (ref 5.0–8.0)

## 2022-01-23 NOTE — Progress Notes (Signed)
I,[name],acting as a Education administrator for [name], FNP.,have documented all relevant documentation on the behalf of [name], FNP,as directed by  [name], FNP while in the presence of [name], [place].   Subjective:     Patient ID: Shannon [name] , female    DOB: [birth year] , 66 y.o.   MRN: [id number]   Chief Complaint  Patient presents with   Annual Exam    HPI  Pt here for HM.  She reports numbness & tingling in left leg. She reports going to [facility] 2 weeks ago. She was diagnosed with degenerative joint disease - she is now on celebrex and pepcid. She is also to start PT today  She has been taking Ozempic 0.25 mg due to being approved with insurance.     Past Medical History:  Diagnosis Date   COVID-19 virus infection 10/13/2018   Diabetes mellitus without complication ([place])    Hypercholesteremia    Sleep apnea    Newly diagnosed in September 2021     Family History  Problem Relation Age of Onset   Hypertension Mother    Diabetes Mother    Stroke Mother    Diabetes Father    Hypertension Father    Stroke Father    Hypertension Son    Diabetes Son    Breast cancer Neg Hx      Current Outpatient Medications:    atorvastatin (LIPITOR) 20 MG tablet, TAKE ONE TABLET BY MOUTH EVERY EVENING with A meal, Disp: 90 tablet, Rfl: 1   blood glucose meter kit and supplies KIT, Dispense based on patient and insurance preference. Use 3 times daily as directed to check blood sugar. Dx code e11.65, Disp: 1 each, Rfl: 9   Blood Glucose Monitoring Suppl (ONE TOUCH ULTRA MINI) w/Device KIT, 1 each by Does not apply route 4 (four) times daily -  before meals and at bedtime., Disp: 1 kit, Rfl: 0   celecoxib (CELEBREX) 200 MG capsule, Take 1 capsule by mouth every morning., Disp: , Rfl:    Cholecalciferol (VITAMIN D-3 PO), Take 1 capsule by mouth daily. 2000 units, Disp: , Rfl:    Continuous Blood Gluc Receiver (FREESTYLE LIBRE 14 DAY READER) DEVI, USE TO CHECK  BLOOD SUGARS FOUR TIMES A DAY DX:E11.65, Disp: 1 each, Rfl: 1   Continuous Blood Gluc Sensor (FREESTYLE LIBRE 2 SENSOR) MISC, CHANGE sensor every 14 DAYS, Disp: 2 each, Rfl: 1   famotidine (PEPCID) 20 MG tablet, Take 1 tablet by mouth every morning., Disp: , Rfl:    glucagon (GLUCAGEN HYPOKIT) 1 MG SOLR injection, Inject 1 mg into the vein once as needed for up to 1 dose for low blood sugar., Disp: 1 each, Rfl: 5   glucose blood (FREESTYLE TEST STRIPS) test strip, Use as instructed, Disp: 100 each, Rfl: 12   glucose blood (FREESTYLE TEST STRIPS) test strip, Use as instructed, Disp: 100 each, Rfl: 12   glucose blood test strip, Use as instructed, Disp: 100 each, Rfl: 5   glucose blood test strip, Use as instructed to check blood sugar 3 times a day. Dx code e11.65, Disp: 100 each, Rfl: 12   insulin degludec (TRESIBA FLEXTOUCH) 100 UNIT/ML FlexTouch Pen, Inject 15 Units into the skin at bedtime., Disp: 15 mL, Rfl: 1   Lancets (ONETOUCH ULTRASOFT) lancets, Use as instructed to check blood sugars 4 times daily E11.9, Disp: 300 each, Rfl: 3   lisinopril (ZESTRIL) 10 MG tablet, Take 1 tablet (10 mg total)  by mouth every morning., Disp: 90 tablet, Rfl: 0   metFORMIN (GLUCOPHAGE XR) 750 MG 24 hr tablet, Take 1 tablet (750 mg total) by mouth daily with breakfast., Disp: 90 tablet, Rfl: 1   Semaglutide,0.25 or 0.5MG/DOS, (OZEMPIC, 0.25 OR 0.5 MG/DOSE,) 2 MG/1.5ML SOPN, Inject 0.5 mg into the skin once a week., Disp: 4.5 mL, Rfl: 1   No Known Allergies    The patient states she is status post hysterectomy. Negative for: breast discharge, breast lump(s), breast pain and breast self exam. Associated symptoms include abnormal vaginal bleeding. Pertinent negatives include abnormal bleeding (hematology), anxiety, decreased libido, depression, difficulty falling sleep, dyspareunia, history of infertility, nocturia, sexual dysfunction, sleep disturbances, urinary incontinence, urinary urgency, vaginal discharge and  vaginal itching. Diet good when she is not working, will eat more when working at night from 7p-7a. The patient states her exercise level is moderate with water aerobics 4 days a week and Silver Sneakers 2-3 days a week.  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 She plans to fully retire at the end of the year  The patient's tobacco use is:  Social History   Tobacco Use  Smoking Status Former   Types: Cigarettes   Quit date: 2006   Years since quitting: 17.5  Smokeless Tobacco Never  Tobacco Comments   quit 10 yrs   She has been exposed to passive smoke. The patient's alcohol use is:  Social History   Substance and Sexual Activity  Alcohol Use Yes   Comment: 1 glass of wine sometimes daily     Review of Systems  Constitutional: Negative.   HENT: Negative.    Eyes: Negative.   Respiratory: Negative.    Cardiovascular: Negative.   Gastrointestinal: Negative.   Endocrine: Negative.   Genitourinary: Negative.   Musculoskeletal: Negative.   Skin: Negative.   Allergic/Immunologic: Negative.   Neurological:  Positive for numbness (and tingling to feet).  Hematological: Negative.   Psychiatric/Behavioral: Negative.       Today's Vitals   01/23/22 1004  BP: 132/82  Pulse: 70  Temp: 98.1 F (36.7 C)  SpO2: 98%  Weight: 155 lb 12.8 oz (70.7 kg)  Height: 5' 2"  (1.575 m)  PainSc: 0-No pain   Body mass index is 28.5 kg/m.  Wt Readings from Last 3 Encounters:  01/23/22 155 lb 12.8 oz (70.7 kg)  12/21/21 155 lb 8 oz (70.5 kg)  11/30/21 152 lb 6.4 oz (69.1 kg)    Objective:  Physical Exam Vitals reviewed.  Constitutional:      General: She is not in acute distress.    Appearance: Normal appearance. She is well-developed.  HENT:     Head: Normocephalic and atraumatic.     Right Ear: Hearing, tympanic membrane, ear canal and external ear normal. There is no impacted cerumen.     Left Ear: Hearing, tympanic membrane, ear canal and external ear normal. There is no impacted  cerumen.     Nose: Nose normal.     Mouth/Throat:     Mouth: Mucous membranes are moist.  Eyes:     General: Lids are normal.     Extraocular Movements: Extraocular movements intact.     Conjunctiva/sclera: Conjunctivae normal.     Pupils: Pupils are equal, round, and reactive to light.     Funduscopic exam:    Right eye: No papilledema.        Left eye: No papilledema.  Neck:     Thyroid: No thyroid mass.  Vascular: No carotid bruit.  Cardiovascular:     Rate and Rhythm: Normal rate and regular rhythm.     Pulses: Normal pulses.     Heart sounds: Normal heart sounds. No murmur heard. Pulmonary:     Effort: Pulmonary effort is normal. No respiratory distress.     Breath sounds: Normal breath sounds. No wheezing.  Chest:     Chest wall: No mass.  Breasts:    Tanner Score is 5.     Right: Normal. No mass or tenderness.     Left: Normal. No mass or tenderness.  Abdominal:     General: Abdomen is flat. Bowel sounds are normal. There is no distension.     Palpations: Abdomen is soft.     Tenderness: There is no abdominal tenderness.  Genitourinary:    Comments: Followed by GYN Musculoskeletal:        General: No swelling or tenderness. Normal range of motion.     Cervical back: Full passive range of motion without pain, normal range of motion and neck supple.     Right lower leg: No edema.     Left lower leg: No edema.  Lymphadenopathy:     Upper Body:     Right upper body: No supraclavicular, axillary or pectoral adenopathy.     Left upper body: No supraclavicular, axillary or pectoral adenopathy.  Skin:    General: Skin is warm and dry.     Capillary Refill: Capillary refill takes less than 2 seconds.     Coloration: Skin is not jaundiced.     Findings: No bruising.  Neurological:     General: No focal deficit present.     Mental Status: She is alert and oriented to person, place, and time.     Cranial Nerves: No cranial nerve deficit.     Sensory: No sensory  deficit.  Psychiatric:        Mood and Affect: Mood normal.        Behavior: Behavior normal.        Thought Content: Thought content normal.        Judgment: Judgment normal.         Assessment And Plan:     1. Encounter for general adult medical examination w/o abnormal findings Behavior modifications discussed and diet history reviewed.   Pt will continue to exercise regularly and modify diet with low GI, plant based foods and decrease intake of processed foods.  Recommend intake of daily multivitamin, Vitamin D, and calcium.  Recommend mammogram and colonoscopy for preventive screenings, as well as recommend immunizations that include influenza, TDAP, and Shingles.  2. 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 EKG done with NSR - POCT Urinalysis Dipstick (81002) - Microalbumin / creatinine urine ratio - EKG 12-Lead - CMP14+EGFR - Hemoglobin A1c  3. Aortic atherosclerosis ([facility]) Comments: Continue statin, tolerating medications.  - Lipid panel  4. Mixed hyperlipidemia - POCT Urinalysis Dipstick (81002) - Microalbumin / creatinine urine ratio  5. Numbness and tingling Comments: She is to take vitamin B12 suppements, she is also advised to call Orthopedics to see if they will decrease her gabapentin  6. Left hip pain Comments: Continue f/u with Orthopedic  7. Left sciatic nerve pain Comments: Continue f/u with Orthopedic  8. Other long term (current) drug therapy - CBC   Patient was given opportunity  to ask questions. Patient verbalized understanding of the plan and was able to repeat key elements of the plan. All questions were answered to their satisfaction.   [name], FNP   I, [name], FNP, have reviewed all documentation for this visit. The documentation on 01/23/22 for  the exam, diagnosis, procedures, and orders are all accurate and complete.   THE PATIENT IS ENCOURAGED TO PRACTICE SOCIAL DISTANCING DUE TO THE COVID-19 PANDEMIC.

## 2022-01-23 NOTE — Patient Instructions (Signed)

## 2022-01-24 LAB — CMP14+EGFR
ALT: 24 IU/L (ref 0–32)
AST: 15 IU/L (ref 0–40)
Albumin/Globulin Ratio: 2 (ref 1.2–2.2)
Albumin: 4.7 g/dL (ref 3.9–4.9)
Alkaline Phosphatase: 82 IU/L (ref 44–121)
BUN/Creatinine Ratio: 31 — ABNORMAL HIGH (ref 12–28)
BUN: 21 mg/dL (ref 8–27)
Bilirubin Total: 0.4 mg/dL (ref 0.0–1.2)
CO2: 24 mmol/L (ref 20–29)
Calcium: 9.7 mg/dL (ref 8.7–10.3)
Chloride: 102 mmol/L (ref 96–106)
Creatinine, Ser: 0.67 mg/dL (ref 0.57–1.00)
Globulin, Total: 2.3 g/dL (ref 1.5–4.5)
Glucose: 115 mg/dL — ABNORMAL HIGH (ref 70–99)
Potassium: 4.8 mmol/L (ref 3.5–5.2)
Sodium: 140 mmol/L (ref 134–144)
Total Protein: 7 g/dL (ref 6.0–8.5)
eGFR: 96 mL/min/{1.73_m2} (ref >59)

## 2022-01-24 LAB — HEMOGLOBIN A1C
Est. average glucose Bld gHb Est-mCnc: 160 mg/dL
Hgb A1c MFr Bld: 7.2 % — ABNORMAL HIGH (ref 4.8–5.6)

## 2022-01-24 LAB — LIPID PANEL
Chol/HDL Ratio: 2 ratio (ref 0.0–4.4)
Cholesterol, Total: 127 mg/dL (ref 100–199)
HDL: 65 mg/dL (ref >39)
LDL Chol Calc (NIH): 49 mg/dL (ref 0–99)
Triglycerides: 63 mg/dL (ref 0–149)
VLDL Cholesterol Cal: 13 mg/dL (ref 5–40)

## 2022-01-24 LAB — CBC
Hematocrit: 35.8 % (ref 34.0–46.6)
Hemoglobin: 11.6 g/dL (ref 11.1–15.9)
MCH: 28.6 pg (ref 26.6–33.0)
MCHC: 32.4 g/dL (ref 31.5–35.7)
MCV: 88 fL (ref 79–97)
Platelets: 199 10*3/uL (ref 150–450)
RBC: 4.06 x10E6/uL (ref 3.77–5.28)
RDW: 13.4 % (ref 11.7–15.4)
WBC: 6 10*3/uL (ref 3.4–10.8)

## 2022-01-24 LAB — MICROALBUMIN / CREATININE URINE RATIO
Creatinine, Urine: 21.8 mg/dL
Microalb/Creat Ratio: <14 mg/g creat (ref 0–29)
Microalbumin, Urine: <3 ug/mL

## 2022-01-29 DIAGNOSIS — M5416 Radiculopathy, lumbar region: Secondary | ICD-10-CM | POA: Diagnosis not present

## 2022-01-31 DIAGNOSIS — M5416 Radiculopathy, lumbar region: Secondary | ICD-10-CM | POA: Diagnosis not present

## 2022-02-02 ENCOUNTER — Other Ambulatory Visit: Payer: Self-pay | Admitting: Internal Medicine

## 2022-02-02 ENCOUNTER — Telehealth: Payer: Self-pay

## 2022-02-02 NOTE — Chronic Care Management (AMB) (Signed)
[employer] patient assistance program notification:  Patient was approved to receive Ozempic 0.5 mg and 120- day supply  should arrive to the office in 10-14 business days. Patient enrollment will expire on 05/24/2022.  Billee Cashing, CMA Clinical Pharmacist Assistant [phone]

## 2022-02-05 ENCOUNTER — Telehealth: Payer: Self-pay

## 2022-02-05 NOTE — Chronic Care Management (AMB) (Signed)
Chronic Care Management Pharmacy Assistant   Name: Shannon [name]  MRN: [id number] DOB: 26-Aug-1955  Reason for Encounter: Medication Review/ Medication coordination  Recent office visits:  01-23-2022 [name], [place]. Glucose= 115, BUN/Creatinine= 31. A1C= 7.2. STOP gabapentin, meloxicam and robaxin. EKG completed.  Recent consult visits:  01-29-2022 Gus Puma, DPT ([facility]). Unable to view encounter    01-23-2022 Roselyn Reef, MD ([facility]). Unable to view encounter    Hospital visits:  None in previous 6 months  Medications: Outpatient Encounter Medications as of 02/05/2022  Medication Sig   atorvastatin (LIPITOR) 20 MG tablet TAKE ONE TABLET BY MOUTH EVERY EVENING with A meal   blood glucose meter kit and supplies KIT Dispense based on patient and insurance preference. Use 3 times daily as directed to check blood sugar. Dx code e11.65   Blood Glucose Monitoring Suppl (ONE TOUCH ULTRA MINI) w/Device KIT 1 each by Does not apply route 4 (four) times daily -  before meals and at bedtime.   celecoxib (CELEBREX) 200 MG capsule Take 1 capsule by mouth every morning.   Cholecalciferol (VITAMIN D-3 PO) Take 1 capsule by mouth daily. 2000 units   Continuous Blood Gluc Receiver (FREESTYLE LIBRE 14 DAY READER) DEVI USE TO CHECK BLOOD SUGARS FOUR TIMES A DAY DX:E11.65   Continuous Blood Gluc Sensor (FREESTYLE LIBRE 2 SENSOR) MISC USE TO check blood glucose AS DIRECTED AND CHANGE sensors every 14 DAYS   famotidine (PEPCID) 20 MG tablet Take 1 tablet by mouth every morning.   glucagon (GLUCAGEN HYPOKIT) 1 MG SOLR injection Inject 1 mg into the vein once as needed for up to 1 dose for low blood sugar.   glucose blood (FREESTYLE TEST STRIPS) test strip Use as instructed   glucose blood (FREESTYLE TEST STRIPS) test strip Use as instructed   glucose blood test strip Use as instructed   glucose blood test strip Use as instructed to check blood sugar 3 times a day. Dx code  e11.65   insulin degludec (TRESIBA FLEXTOUCH) 100 UNIT/ML FlexTouch Pen Inject 15 Units into the skin at bedtime.   Lancets (ONETOUCH ULTRASOFT) lancets Use as instructed to check blood sugars 4 times daily E11.9   lisinopril (ZESTRIL) 10 MG tablet Take 1 tablet (10 mg total) by mouth every morning.   metFORMIN (GLUCOPHAGE XR) 750 MG 24 hr tablet Take 1 tablet (750 mg total) by mouth daily with breakfast.   Semaglutide,0.25 or 0.5MG/DOS, (OZEMPIC, 0.25 OR 0.5 MG/DOSE,) 2 MG/1.5ML SOPN Inject 0.5 mg into the skin once a week.   No facility-administered encounter medications on file as of 02/05/2022.  Reviewed chart for medication changes ahead of medication coordination call.  No  hospital visits since last care coordination call/Pharmacist visit.   BP Readings from Last 3 Encounters:  01/23/22 132/82  12/21/21 (!) 158/82  11/30/21 122/60    Lab Results  Component Value Date   HGBA1C 7.2 (H) 01/23/2022     Patient obtains medications through Adherence Packaging  30 Days   Last adherence delivery included: Freestyle Libre Kit 2 Sensors- Use as directed every 14 days Lisinopril 10 mg 1 tablet daily (breakfast) Vitamin D3 50 mcg - 1 capsule daily (evening meal) Atorvastatin 20 mg 1 tablet daily (evening meal) Metformin 750 mg 1 tablet daily (breakfast) Tresiba 15 units at bed time  Patient declined (meds) last month: None  Patient is due for next adherence delivery on: 02-15-2022  Called patient and reviewed medications and coordinated delivery.  This delivery to include: Freestyle Libre Kit 2 Sensors- Use as directed every 14 days Lisinopril 10 mg 1 tablet daily (breakfast) Vitamin D3 50 mcg - 1 capsule daily (evening meal) Atorvastatin 20 mg 1 tablet daily (evening meal) Metformin 750 mg 1 tablet daily (breakfast)   No acute/short fill needed  Patient declined the following medications: None  Patient needs refills for: [place] to PCP  Confirmed  delivery date of 02-15-2022 advised patient that pharmacy will contact them the morning of delivery.   Care Gaps: AWV 12-05-2022 Covid booster overdue  Star Rating Drugs: Atorvastatin 20 mg- Last filled 01-11-2022 30 [facility] Ozempic 0.5 mg- Patient assistance Metformin 750 mg- Last filled 01-11-2022 [facility] Lisinopril 10 mg- Last filled 01-11-2022 30 [facility]  [place] Clinical Pharmacist Assistant [phone]

## 2022-02-06 ENCOUNTER — Ambulatory Visit (INDEPENDENT_AMBULATORY_CARE_PROVIDER_SITE_OTHER): Payer: HMO | Admitting: Ophthalmology

## 2022-02-06 ENCOUNTER — Encounter (INDEPENDENT_AMBULATORY_CARE_PROVIDER_SITE_OTHER): Payer: Self-pay | Admitting: Ophthalmology

## 2022-02-06 DIAGNOSIS — E113411 Type 2 diabetes mellitus with severe nonproliferative diabetic retinopathy with macular edema, right eye: Secondary | ICD-10-CM

## 2022-02-06 DIAGNOSIS — E113413 Type 2 diabetes mellitus with severe nonproliferative diabetic retinopathy with macular edema, bilateral: Secondary | ICD-10-CM

## 2022-02-06 DIAGNOSIS — G4730 Sleep apnea, unspecified: Secondary | ICD-10-CM | POA: Diagnosis not present

## 2022-02-06 DIAGNOSIS — E113412 Type 2 diabetes mellitus with severe nonproliferative diabetic retinopathy with macular edema, left eye: Secondary | ICD-10-CM | POA: Diagnosis not present

## 2022-02-06 MED ORDER — FLUORESCEIN SODIUM 10 % IV SOLN
500.0000 mg | INTRAVENOUS | Status: AC | PRN
  Administered 2022-02-06: 500 mg via INTRAVENOUS

## 2022-02-06 MED ORDER — BEVACIZUMAB CHEMO INJECTION 1.25MG/0.05ML SYRINGE FOR KALEIDOSCOPE
1.2500 mg | INTRAVITREAL | Status: AC | PRN
  Administered 2022-02-06: 1.25 mg via INTRAVITREAL

## 2022-02-06 NOTE — Progress Notes (Signed)
02/06/2022     CHIEF COMPLAINT Patient presents for  Chief Complaint  Patient presents with   Diabetic Retinopathy with Macular Edema      HISTORY OF PRESENT ILLNESS: Shannon [name] is a 66 y.o. female who presents to the clinic today for:   HPI   7 weeks for DILATE OU, OPTOS FFA R/L, COLOR FP, AVASTIN OCT, OS, POSSIBLE OD. Pt stated no changes in vision since last visit.  Last edited by Silvestre Moment on 02/06/2022 11:11 AM.      Referring physician: [name], [address] [place],  [place] [address]  HISTORICAL INFORMATION:   Selected notes from the MEDICAL RECORD NUMBER    Lab Results  Component Value Date   HGBA1C 7.2 (H) 01/23/2022     CURRENT MEDICATIONS: No current outpatient medications on file. (Ophthalmic Drugs)   No current facility-administered medications for this visit. (Ophthalmic Drugs)   Current Outpatient Medications (Other)  Medication Sig   atorvastatin (LIPITOR) 20 MG tablet TAKE ONE TABLET BY MOUTH EVERY EVENING with A meal   blood glucose meter kit and supplies KIT Dispense based on patient and insurance preference. Use 3 times daily as directed to check blood sugar. Dx code e11.65   Blood Glucose Monitoring Suppl (ONE TOUCH ULTRA MINI) w/Device KIT 1 each by Does not apply route 4 (four) times daily -  before meals and at bedtime.   celecoxib (CELEBREX) 200 MG capsule Take 1 capsule by mouth every morning.   Cholecalciferol (VITAMIN D-3 PO) Take 1 capsule by mouth daily. 2000 units   Continuous Blood Gluc Receiver (FREESTYLE LIBRE 14 DAY READER) DEVI USE TO CHECK BLOOD SUGARS FOUR TIMES A DAY DX:E11.65   Continuous Blood Gluc Sensor (FREESTYLE LIBRE 2 SENSOR) MISC USE TO check blood glucose AS DIRECTED AND CHANGE sensors every 14 DAYS   famotidine (PEPCID) 20 MG tablet Take 1 tablet by mouth every morning.   glucagon (GLUCAGEN HYPOKIT) 1 MG SOLR injection Inject 1 mg into the vein once as needed for up to 1 dose for low blood  sugar.   glucose blood (FREESTYLE TEST STRIPS) test strip Use as instructed   glucose blood (FREESTYLE TEST STRIPS) test strip Use as instructed   glucose blood test strip Use as instructed   glucose blood test strip Use as instructed to check blood sugar 3 times a day. Dx code e11.65   insulin degludec (TRESIBA FLEXTOUCH) 100 UNIT/ML FlexTouch Pen Inject 15 Units into the skin at bedtime.   Lancets (ONETOUCH ULTRASOFT) lancets Use as instructed to check blood sugars 4 times daily E11.9   lisinopril (ZESTRIL) 10 MG tablet Take 1 tablet (10 mg total) by mouth every morning.   metFORMIN (GLUCOPHAGE XR) 750 MG 24 hr tablet Take 1 tablet (750 mg total) by mouth daily with breakfast.   Semaglutide,0.25 or 0.5MG/DOS, (OZEMPIC, 0.25 OR 0.5 MG/DOSE,) 2 MG/1.5ML SOPN Inject 0.5 mg into the skin once a week.   No current facility-administered medications for this visit. (Other)      REVIEW OF SYSTEMS: ROS   Negative for: Constitutional, Gastrointestinal, Neurological, Skin, Genitourinary, Musculoskeletal, HENT, Endocrine, Cardiovascular, Eyes, Respiratory, Psychiatric, Allergic/Imm, Heme/Lymph Last edited by Silvestre Moment on 02/06/2022 11:11 AM.       ALLERGIES No Known Allergies  PAST MEDICAL HISTORY Past Medical History:  Diagnosis Date   COVID-19 virus infection 10/13/2018   Diabetes mellitus without complication ([place])    Hypercholesteremia    Sleep apnea  Newly diagnosed in September 2021   Past Surgical History:  Procedure Laterality Date   ABDOMINAL HYSTERECTOMY     CATARACT EXTRACTION Bilateral    CESAREAN SECTION     MYOMECTOMY      FAMILY HISTORY Family History  Problem Relation Age of Onset   Hypertension Mother    Diabetes Mother    Stroke Mother    Diabetes Father    Hypertension Father    Stroke Father    Hypertension Son    Diabetes Son    Breast cancer Neg Hx     SOCIAL HISTORY Social History   Tobacco Use   Smoking status: Former    Types: Cigarettes     Quit date: 2006    Years since quitting: 17.6   Smokeless tobacco: Never   Tobacco comments:    quit 10 yrs  Vaping Use   Vaping Use: Never used  Substance Use Topics   Alcohol use: Yes    Comment: 1 glass of wine sometimes daily    Drug use: Never         OPHTHALMIC EXAM:  Base Eye Exam     Visual Acuity     ETDRS         Pupils       Pupils APD   Right PERRL None   Left PERRL None         Visual Fields       Left Right    Full Full         Extraocular Movement       Right Left    Full, Ortho Full, Ortho         Neuro/Psych     Oriented x3: Yes   Mood/Affect: Normal           Slit Lamp and Fundus Exam     External Exam       Right Left   External Normal Normal         Slit Lamp Exam       Right Left   Lids/Lashes Normal Normal   Conjunctiva/Sclera White and quiet White and quiet   Cornea Clear Clear   Anterior Chamber Deep and quiet Deep and quiet   Iris Round and reactive Round and reactive   Lens Posterior chamber intraocular lens Posterior chamber intraocular lens   Anterior Vitreous Normal Normal         Fundus Exam       Right Left   Posterior Vitreous  Normal, small vitreous debris, no cells, no overt PVD   Disc  Normal, posterior pole cotton-wool spots noted   C/D Ratio  0.2   Macula  Microaneurysms, no detectable clinically significant macular edema   Vessels  NPDR-Severe   Periphery  Normal            IMAGING AND PROCEDURES  Imaging and Procedures for 02/06/22  OCT, Retina - OU - Both Eyes       Right Eye Quality was good. Scan locations included subfoveal. Central Foveal Thickness: 266. Progression has worsened. Findings include abnormal foveal contour.   Left Eye Quality was good. Scan locations included subfoveal. Central Foveal Thickness: 282. Progression has improved. Findings include abnormal foveal contour.   Notes CSME OD, with minor CSME.  Will treat today and then observe here after  OD    OS with much less thickening inferior portion of the FAZ extending into the center involvement post resumption of Avastin therapy March 2023.  Will need repeat and resume injection Avastin OS only today, and reevaluate in 10weeks     Intravitreal Injection, Pharmacologic Agent - OD - Right Eye       Time Out 02/06/2022. 11:50 AM. Confirmed correct patient, procedure, site, and patient consented.   Anesthesia Topical anesthesia was used. Anesthetic medications included Akten 3.5%.   Procedure Preparation included 5% betadine to ocular surface, 10% betadine to eyelids, Tobramycin 0.3%. A 30 gauge needle was used.   Injection: 1.25 mg Bevacizumab 1.76m/0.05ml   Route: Intravitreal, Site: Right Eye   NDC:: 86761-950-93 Lot:: [id number] Expiration date: 04/27/2022   Post-op Post injection exam found visual acuity of at least counting fingers. The patient tolerated the procedure well. There were no complications. The patient received written and verbal post procedure care education. Post injection medications were not given.      Intravitreal Injection, Pharmacologic Agent - OS - Left Eye       Time Out 02/06/2022. 11:51 AM. Confirmed correct patient, procedure, site, and patient consented.   Anesthesia Topical anesthesia was used. Anesthetic medications included Lidocaine 4%.   Procedure Preparation included 5% betadine to ocular surface, 10% betadine to eyelids, Tobramycin 0.3%. A 30 gauge needle was used.   Injection: 1.25 mg Bevacizumab 1.27m0.05ml   Route: Intravitreal, Site: Left Eye   NDC: : 38250-539-76Lot: : [id number] date: 04/27/2022   Post-op Post injection exam found visual acuity of at least counting fingers. The patient tolerated the procedure well. There were no complications. The patient received written and verbal post procedure care education. Post injection medications were not given.      Fluorescein Angiography Optos (Transit  OD)       Injection: 500 mg Fluorescein Sodium 10 %   Route: Intravenous   NDC: 007353-2992-42 Right Eye   Early phase findings include microaneurysm. Choroidal neovascularization is not present.   Left Eye Mid/Late phase findings include microaneurysm. Choroidal neovascularization is not present.   Notes Perifoveal CSME minor OU  OU.  With severe NPDR.     Color Fundus Photography Optos - OU - Both Eyes       Right Eye Progression has been stable. Disc findings include normal observations. Macula : microaneurysms. Vessels : IRMA.   Left Eye Progression has been stable. Disc findings include normal observations. Macula : microaneurysms. Vessels : IRMA.   Notes OD with posterior pole cotton-wool spots suggestive of superimposed hypertensive retinopathy in the past have now resolved, possibly coincident with control of nightly periodic hypertensive episodes from untreated sleep apnea..  I discussed these findings with the patient have encouraged her to monitor her waking hours for hypertension.             ASSESSMENT/PLAN:  Sleep apnea in adult Continues on CPAP therapy with good effect.      ICD-10-CM   1. Severe nonproliferative diabetic retinopathy of left eye, with macular edema, associated with type 2 diabetes mellitus (HCC)  E1A83.4196CT, Retina - OU - Both Eyes    Intravitreal Injection, Pharmacologic Agent - OS - Left Eye    Fluorescein Angiography Optos (Transit OD)    Color Fundus Photography Optos - OU - Both Eyes    Fluorescein Sodium 10 % injection 500 mg    Bevacizumab (AVASTIN) SOLN 1.25 mg    2. Severe nonproliferative diabetic retinopathy of right eye, with macular edema, associated with type 2 diabetes mellitus (HCC)  E11.3411 OCT, Retina - OU - Both Eyes    Intravitreal Injection, Pharmacologic  Agent - OD - Right Eye    Fluorescein Angiography Optos (Transit OD)    Color Fundus Photography Optos - OU - Both Eyes    Fluorescein Sodium 10 %  injection 500 mg    Bevacizumab (AVASTIN) SOLN 1.25 mg    3. Sleep apnea in adult  G47.30       1.  Bilateral severe NPDR with minor CSME.  We will treat today to prevent progression and reevaluate next in 10 weeks without planned injection.  2.  3.  Ophthalmic Meds Ordered this visit:  Meds ordered this encounter  Medications   Fluorescein Sodium 10 % injection 500 mg   Bevacizumab (AVASTIN) SOLN 1.25 mg   Bevacizumab (AVASTIN) SOLN 1.25 mg       Return in about 10 weeks (around 04/17/2022) for DILATE OU, OCT, no planned injection.  There are no Patient Instructions on file for this visit.   Explained the diagnoses, plan, and follow up with the patient and they expressed understanding.  Patient expressed understanding of the importance of proper follow up care.   Clent Demark Duvid Smalls M.D. Diseases & Surgery of the Retina and Vitreous [facility] [place] 02/06/22     Abbreviations: M myopia (nearsighted); A astigmatism; H hyperopia (farsighted); P presbyopia; Mrx spectacle prescription;  CTL contact lenses; OD right eye; OS left eye; OU both eyes  XT exotropia; ET esotropia; PEK punctate epithelial keratitis; PEE punctate epithelial erosions; DES dry eye syndrome; MGD meibomian gland dysfunction; ATs artificial tears; PFAT's preservative free artificial tears; [place] nuclear sclerotic cataract; PSC posterior subcapsular cataract; ERM epi-retinal membrane; PVD posterior vitreous detachment; RD retinal detachment; DM diabetes mellitus; DR diabetic retinopathy; NPDR non-proliferative diabetic retinopathy; PDR proliferative diabetic retinopathy; CSME clinically significant macular edema; DME diabetic macular edema; dbh dot blot hemorrhages; CWS cotton wool spot; POAG primary open angle glaucoma; C/D cup-to-disc ratio; HVF humphrey visual field; GVF goldmann visual field; OCT optical coherence tomography; IOP intraocular pressure; BRVO Branch retinal vein occlusion; CRVO central  retinal vein occlusion; CRAO central retinal artery occlusion; BRAO branch retinal artery occlusion; RT retinal tear; SB scleral buckle; PPV pars plana vitrectomy; VH Vitreous hemorrhage; PRP panretinal laser photocoagulation; IVK intravitreal kenalog; VMT vitreomacular traction; MH Macular hole;  NVD neovascularization of the disc; NVE neovascularization elsewhere; AREDS age related eye disease study; ARMD age related macular degeneration; POAG primary open angle glaucoma; EBMD epithelial/anterior basement membrane dystrophy; ACIOL anterior chamber intraocular lens; IOL intraocular lens; PCIOL posterior chamber intraocular lens; Phaco/IOL phacoemulsification with intraocular lens placement; [place] photorefractive keratectomy; LASIK laser assisted in situ keratomileusis; HTN hypertension; DM diabetes mellitus; COPD chronic obstructive pulmonary disease

## 2022-02-06 NOTE — Assessment & Plan Note (Signed)
Continues on CPAP therapy with good effect.

## 2022-02-07 ENCOUNTER — Encounter: Payer: Self-pay | Admitting: Family Medicine

## 2022-02-07 DIAGNOSIS — M5416 Radiculopathy, lumbar region: Secondary | ICD-10-CM | POA: Diagnosis not present

## 2022-02-09 ENCOUNTER — Telehealth: Payer: Self-pay

## 2022-02-09 DIAGNOSIS — M5416 Radiculopathy, lumbar region: Secondary | ICD-10-CM | POA: Diagnosis not present

## 2022-02-09 NOTE — Telephone Encounter (Signed)
Lvm to notify pt that PAP Ozempic has arrived and ready for pick up.

## 2022-02-14 DIAGNOSIS — M5416 Radiculopathy, lumbar region: Secondary | ICD-10-CM | POA: Diagnosis not present

## 2022-02-22 ENCOUNTER — Other Ambulatory Visit: Payer: Self-pay | Admitting: Nurse Practitioner

## 2022-02-22 DIAGNOSIS — E113493 Type 2 diabetes mellitus with severe nonproliferative diabetic retinopathy without macular edema, bilateral: Secondary | ICD-10-CM

## 2022-03-01 DIAGNOSIS — M5416 Radiculopathy, lumbar region: Secondary | ICD-10-CM | POA: Diagnosis not present

## 2022-03-05 DIAGNOSIS — M5416 Radiculopathy, lumbar region: Secondary | ICD-10-CM | POA: Diagnosis not present

## 2022-03-06 ENCOUNTER — Telehealth: Payer: Self-pay

## 2022-03-06 ENCOUNTER — Other Ambulatory Visit: Payer: Self-pay

## 2022-03-06 DIAGNOSIS — Z794 Long term (current) use of insulin: Secondary | ICD-10-CM

## 2022-03-06 MED ORDER — METFORMIN HCL ER 750 MG PO TB24: 750.0000 mg | ORAL_TABLET | Freq: Every day | ORAL | 1 refills | Status: DC

## 2022-03-06 MED ORDER — LISINOPRIL 10 MG PO TABS: 10.0000 mg | ORAL_TABLET | Freq: Every morning | ORAL | 0 refills | Status: DC

## 2022-03-06 MED ORDER — ATORVASTATIN CALCIUM 20 MG PO TABS: ORAL_TABLET | ORAL | 1 refills | Status: DC

## 2022-03-06 NOTE — Chronic Care Management (AMB) (Signed)
Chronic Care Management Pharmacy Assistant   Name: Shannon [name]  MRN: [id number] DOB: May 23, 1956  Reason for Encounter: Medication Review/ Medication coordination  Recent office visits:  None  Recent consult visits:  02-14-2022 Burnis Medin  DPT ([facility]). Follow up visit.  02-09-2022 Burnis Medin  DPT ([facility]). Follow up visit.  02-07-2022 Nickie Retort  PTA. ([facility]). Follow up visit.  02-06-2022 Rankin, Clent Demark, MD (Ophthalmology). Color Fundus Photography Optos - OU - Both Eyes, Fluorescein Angiography Optos (Transit OD), Intravitreal Injection, Pharmacologic Agent - OD - Right Eye, Intravitreal Injection, Pharmacologic Agent - OS - Left Eye, OCT, Retina - OU - Both Eyes completed.  Hospital visits:  None in previous 6 months  Medications: Outpatient Encounter Medications as of 03/06/2022  Medication Sig   atorvastatin (LIPITOR) 20 MG tablet TAKE ONE TABLET BY MOUTH EVERY EVENING with A meal   blood glucose meter kit and supplies KIT Dispense based on patient and insurance preference. Use 3 times daily as directed to check blood sugar. Dx code e11.65   Blood Glucose Monitoring Suppl (ONE TOUCH ULTRA MINI) w/Device KIT 1 each by Does not apply route 4 (four) times daily -  before meals and at bedtime.   celecoxib (CELEBREX) 200 MG capsule Take 1 capsule by mouth every morning.   Cholecalciferol (VITAMIN D-3 PO) Take 1 capsule by mouth daily. 2000 units   Continuous Blood Gluc Receiver (FREESTYLE LIBRE 14 DAY READER) DEVI USE TO CHECK BLOOD SUGARS FOUR TIMES A DAY DX:E11.65   Continuous Blood Gluc Sensor (FREESTYLE LIBRE 2 SENSOR) MISC USE TO check blood glucose AS DIRECTED AND CHANGE sensors every 14 DAYS   famotidine (PEPCID) 20 MG tablet Take 1 tablet by mouth every morning.   glucagon (GLUCAGEN HYPOKIT) 1 MG SOLR injection Inject 1 mg into the vein once as needed for up to 1 dose for low blood sugar.   glucose blood (FREESTYLE  TEST STRIPS) test strip Use as instructed   glucose blood (FREESTYLE TEST STRIPS) test strip Use as instructed   glucose blood test strip Use as instructed   glucose blood test strip Use as instructed to check blood sugar 3 times a day. Dx code e11.65   Lancets (ONETOUCH ULTRASOFT) lancets Use as instructed to check blood sugars 4 times daily E11.9   lisinopril (ZESTRIL) 10 MG tablet Take 1 tablet (10 mg total) by mouth every morning.   metFORMIN (GLUCOPHAGE XR) 750 MG 24 hr tablet Take 1 tablet (750 mg total) by mouth daily with breakfast.   Semaglutide,0.25 or 0.5MG/DOS, (OZEMPIC, 0.25 OR 0.5 MG/DOSE,) 2 MG/1.5ML SOPN Inject 0.5 mg into the skin once a week.   TRESIBA FLEXTOUCH 100 UNIT/ML FlexTouch Pen inject 15 units AT bedtime   No facility-administered encounter medications on file as of 03/06/2022.  Reviewed chart for medication changes ahead of medication coordination call.  No hospital visits since last care coordination call/Pharmacist visit.   No medication changes indicated   BP Readings from Last 3 Encounters:  01/23/22 132/82  12/21/21 (!) 158/82  11/30/21 122/60    Lab Results  Component Value Date   HGBA1C 7.2 (H) 01/23/2022     Patient obtains medications through Adherence Packaging  30 Days   Last adherence delivery included:  Freestyle Libre Kit 2 Sensors- Use as directed every 14 days Lisinopril 10 mg 1 tablet daily (breakfast) Vitamin D3 50 mcg - 1 capsule daily (evening meal) Atorvastatin 20 mg 1 tablet daily (evening  meal) Metformin 750 mg 1 tablet daily (breakfast)  Patient declined (meds) last month: None  Patient is due for next adherence delivery on: 03-16-2022  Called patient and reviewed medications and coordinated delivery.  This delivery to include: Freestyle Libre Kit 2 Sensors- Use as directed every 14 days Lisinopril 10 mg 1 tablet daily (breakfast) Vitamin D3 50 mcg - 1 capsule daily (evening meal) Atorvastatin 20 mg 1 tablet daily  (evening meal) Metformin 750 mg 1 tablet daily (breakfast) Famotidine 20 mg daily (Breakfast) Celebrex 200 mg daily (Breakfast)  No acute/short fill needed  Patient declined the following medications (meds) due to (reason)  Patient needs refills for: Sent Lisinopril- PCP Atorvastatin- PCP Metformin- PCP Celebrex- request sent by [facility]  Confirmed delivery date of 03-16-2022 advised patient that pharmacy will contact them the morning of delivery.   Care Gaps: AWV 12-05-2022 Covid booster overdue Flu vaccine overdue  Star Rating Drugs: Atorvastatin 20 mg- Last filled 02-12-2022 [facility] Ozempic 0.5 mg- Patient assistance Metformin 750 mg- Last filled 02-12-2022 [facility] Lisinopril 10 mg- Last filled 02-12-2022 30 [facility]  [place] Clinical Pharmacist Assistant [phone]

## 2022-03-12 DIAGNOSIS — M5416 Radiculopathy, lumbar region: Secondary | ICD-10-CM | POA: Diagnosis not present

## 2022-03-13 ENCOUNTER — Telehealth: Payer: Self-pay

## 2022-03-13 DIAGNOSIS — M5451 Vertebrogenic low back pain: Secondary | ICD-10-CM | POA: Diagnosis not present

## 2022-03-13 NOTE — Chronic Care Management (AMB) (Signed)
03-13-2022: Patient's celebrex refill request for medication delivery was denied by Dr. Rolena Infante stating patient needs to contact provider. Informed patient and she stated she has an appointment scheduled with provider this afternoon and will follow up.  [place] Pharmacist Assistant [phone]

## 2022-03-16 DIAGNOSIS — M5416 Radiculopathy, lumbar region: Secondary | ICD-10-CM | POA: Diagnosis not present

## 2022-03-26 ENCOUNTER — Telehealth: Payer: Self-pay

## 2022-03-26 NOTE — Chronic Care Management (AMB) (Signed)
      Shannon [name] was reminded to have all medications, supplements and any blood glucose and blood pressure readings available for review with [name], Pharm. D, at her telephone visit on 03-28-2022 at 12:00.   Questions: Have you had any recent office visit or specialist visit outside of [place]? Patient stated Dr. Rolena Infante at [facility].  Are there any concerns you would like to discuss during your office visit? Patient stated no.  Are you having any problems obtaining your medications? (Whether it pharmacy issues or cost) Patient stated no  If patient has any PAP medications ask if they are having any problems getting their PAP medication or refill? Patient stated no.  Care Gaps: AWV 12-05-2022 Covid booster overdue Flu vaccine overdue  Star Rating Drug: Atorvastatin 20 mg- Last filled 03-12-2022 [facility] Ozempic 0.5 mg- Patient assistance Metformin 750 mg- Last filled 03-12-2022 [facility] Lisinopril 10 mg- Last filled 03-12-2022 30 [facility]  Any gaps in medications fill history? No  [place] Pharmacist Assistant [phone]

## 2022-03-27 ENCOUNTER — Telehealth: Payer: Self-pay

## 2022-03-27 DIAGNOSIS — M5416 Radiculopathy, lumbar region: Secondary | ICD-10-CM | POA: Diagnosis not present

## 2022-03-27 NOTE — Telephone Encounter (Signed)
  Care Management   Follow Up Pharmacist CCM Note    03/27/2022 Name: Shannon [name] MRN: [id number] DOB: 05-28-1956   Referred by: [name], FNP Reason for referral : No chief complaint on file.   Called patient after review of her chart. Patient is doing very well and will no longer require a visit with me on tomorrow. Patient thanked me for helping her with everything. Reassured her that she will be able to reach out directly to the team with any questions.  Also confirmed that we will follow up on the patient assistance application for next year.   Follow Up Plan: The patient has been provided with contact information for the care management team and has been advised to call with any health related questions or concerns.   [name], CPP, PharmD Clinical Pharmacist Practitioner [facility] [phone]

## 2022-03-28 ENCOUNTER — Telehealth: Payer: HMO

## 2022-04-03 DIAGNOSIS — M5416 Radiculopathy, lumbar region: Secondary | ICD-10-CM | POA: Diagnosis not present

## 2022-04-05 ENCOUNTER — Telehealth: Payer: Self-pay

## 2022-04-05 NOTE — Chronic Care Management (AMB) (Signed)
Chronic Care Management Pharmacy Assistant   Name: Shannon [name]  MRN: [id number] DOB: Dec 09, 1955  Reason for Encounter: Medication Review/ Medication coordination  Recent office visits:  None  Recent consult visits:  03-13-2022 Roselyn Reef, MD ([facility]). Follow up visit.  Hospital visits:  None in previous 6 months  Medications: Outpatient Encounter Medications as of 04/05/2022  Medication Sig   atorvastatin (LIPITOR) 20 MG tablet TAKE ONE TABLET BY MOUTH EVERY EVENING with A meal   blood glucose meter kit and supplies KIT Dispense based on patient and insurance preference. Use 3 times daily as directed to check blood sugar. Dx code e11.65   Blood Glucose Monitoring Suppl (ONE TOUCH ULTRA MINI) w/Device KIT 1 each by Does not apply route 4 (four) times daily -  before meals and at bedtime.   celecoxib (CELEBREX) 200 MG capsule Take 1 capsule by mouth every morning.   Cholecalciferol (VITAMIN D-3 PO) Take 1 capsule by mouth daily. 2000 units   Continuous Blood Gluc Receiver (FREESTYLE LIBRE 14 DAY READER) DEVI USE TO CHECK BLOOD SUGARS FOUR TIMES A DAY DX:E11.65   Continuous Blood Gluc Sensor (FREESTYLE LIBRE 2 SENSOR) MISC USE TO check blood glucose AS DIRECTED AND CHANGE sensors every 14 DAYS   famotidine (PEPCID) 20 MG tablet Take 1 tablet by mouth every morning.   glucagon (GLUCAGEN HYPOKIT) 1 MG SOLR injection Inject 1 mg into the vein once as needed for up to 1 dose for low blood sugar.   glucose blood (FREESTYLE TEST STRIPS) test strip Use as instructed   glucose blood (FREESTYLE TEST STRIPS) test strip Use as instructed   glucose blood test strip Use as instructed   glucose blood test strip Use as instructed to check blood sugar 3 times a day. Dx code e11.65   Lancets (ONETOUCH ULTRASOFT) lancets Use as instructed to check blood sugars 4 times daily E11.9   lisinopril (ZESTRIL) 10 MG tablet Take 1 tablet (10 mg total) by mouth every morning.   metFORMIN  (GLUCOPHAGE XR) 750 MG 24 hr tablet Take 1 tablet (750 mg total) by mouth daily with breakfast.   Semaglutide,0.25 or 0.5MG/DOS, (OZEMPIC, 0.25 OR 0.5 MG/DOSE,) 2 MG/1.5ML SOPN Inject 0.5 mg into the skin once a week.   TRESIBA FLEXTOUCH 100 UNIT/ML FlexTouch Pen inject 15 units AT bedtime   No facility-administered encounter medications on file as of 04/05/2022.  Reviewed chart for medication changes ahead of medication coordination call.  No hospital visits since last care coordination call/Pharmacist visit.   No medication changes indicated   BP Readings from Last 3 Encounters:  01/23/22 132/82  12/21/21 (!) 158/82  11/30/21 122/60    Lab Results  Component Value Date   HGBA1C 7.2 (H) 01/23/2022     Patient obtains medications through Adherence Packaging  30 Days   Last adherence delivery included:  Freestyle Libre Kit 2 Sensors- Use as directed every 14 days Lisinopril 10 mg 1 tablet daily (breakfast) Vitamin D3 50 mcg - 1 capsule daily (evening meal) Atorvastatin 20 mg 1 tablet daily (evening meal) Metformin 750 mg 1 tablet daily (breakfast) Famotidine 20 mg daily (Breakfast)   Patient declined (meds) last month: None  Patient is due for next adherence delivery on: 04-17-2022  Called patient and reviewed medications and coordinated delivery.  This delivery to include: Freestyle Libre Kit 2 Sensors- Use as directed every 14 days Lisinopril 10 mg 1 tablet daily (breakfast) Vitamin D3 50 mcg - 1 capsule daily (evening  meal) Atorvastatin 20 mg 1 tablet daily (evening meal) Metformin 750 mg 1 tablet daily (breakfast) Famotidine 20 mg daily (Breakfast)  No acute/short fill needed  Patient declined the following medications: T[place]iba- Medication will be delivered before 04-17-2022 per [facility] Celebrex- D/C  Patient needs refills for: Sent by [facility] Gabapentin Famotidine  Confirmed delivery date of 04-17-2022 advised patient that pharmacy will contact  them the morning of delivery.   Care Gaps: AWV 12-05-2022 Covid booster overdue Flu vaccine overdue  Star Rating Drugs: Atorvastatin 20 mg- Last filled 03-12-2022 [facility] Ozempic 0.5 mg- Patient assistance Metformin 750 mg- Last filled 03-12-2022 [facility] Lisinopril 10 mg- Last filled 03-12-2022 30 [facility]  [place] Clinical Pharmacist Assistant [phone]

## 2022-04-12 ENCOUNTER — Other Ambulatory Visit: Payer: Self-pay | Admitting: Internal Medicine

## 2022-04-17 ENCOUNTER — Encounter (INDEPENDENT_AMBULATORY_CARE_PROVIDER_SITE_OTHER): Payer: HMO | Admitting: Ophthalmology

## 2022-04-26 ENCOUNTER — Encounter (INDEPENDENT_AMBULATORY_CARE_PROVIDER_SITE_OTHER): Payer: HMO | Admitting: Ophthalmology

## 2022-05-03 ENCOUNTER — Encounter: Payer: Self-pay | Admitting: Nurse Practitioner

## 2022-05-04 ENCOUNTER — Telehealth: Payer: Self-pay

## 2022-05-04 NOTE — Chronic Care Management (AMB) (Signed)
Chronic Care Management Pharmacy Assistant   Name: Shannon [name]  MRN: [id number] DOB: [birth year]  Reason for Encounter: Medication Review/ Medication coordination  Recent office visits:  None  Recent consult visits:  04-25-2022 Roselyn Reef, MD ([place]). Follow up visit.  04-24-2022 JOSHI,DHANASHREE ([place]) Follow up visit.  Hospital visits:  None in previous 6 months  Medications: Outpatient Encounter Medications as of 05/04/2022  Medication Sig   atorvastatin (LIPITOR) 20 MG tablet TAKE ONE TABLET BY MOUTH EVERY EVENING with A meal   blood glucose meter kit and supplies KIT Dispense based on patient and insurance preference. Use 3 times daily as directed to check blood sugar. Dx code e11.65   Blood Glucose Monitoring Suppl (ONE TOUCH ULTRA MINI) w/Device KIT 1 each by Does not apply route 4 (four) times daily -  before meals and at bedtime.   celecoxib (CELEBREX) 200 MG capsule Take 1 capsule by mouth every morning.   Cholecalciferol (VITAMIN D-3 PO) Take 1 capsule by mouth daily. 2000 units   Continuous Blood Gluc Receiver (FREESTYLE LIBRE 14 DAY READER) DEVI USE TO CHECK BLOOD SUGARS FOUR TIMES A DAY DX:E11.65   Continuous Blood Gluc Sensor (FREESTYLE LIBRE 2 SENSOR) MISC USE TO check blood glucose AS DIRECTED AND CHANGE sensor every 14 DAYS   famotidine (PEPCID) 20 MG tablet Take 1 tablet by mouth every morning.   glucagon (GLUCAGEN HYPOKIT) 1 MG SOLR injection Inject 1 mg into the vein once as needed for up to 1 dose for low blood sugar.   glucose blood (FREESTYLE TEST STRIPS) test strip Use as instructed   glucose blood (FREESTYLE TEST STRIPS) test strip Use as instructed   glucose blood test strip Use as instructed   glucose blood test strip Use as instructed to check blood sugar 3 times a day. Dx code e11.65   Lancets (ONETOUCH ULTRASOFT) lancets Use as instructed to check blood sugars 4 times daily E11.9   lisinopril (ZESTRIL) 10 MG tablet Take 1 tablet  (10 mg total) by mouth every morning.   metFORMIN (GLUCOPHAGE XR) 750 MG 24 hr tablet Take 1 tablet (750 mg total) by mouth daily with breakfast.   Semaglutide,0.25 or 0.5MG/DOS, (OZEMPIC, 0.25 OR 0.5 MG/DOSE,) 2 MG/1.5ML SOPN Inject 0.5 mg into the skin once a week.   TRESIBA FLEXTOUCH 100 UNIT/ML FlexTouch Pen inject 15 units AT bedtime   No facility-administered encounter medications on file as of 05/04/2022.  Reviewed chart for medication changes ahead of medication coordination call.  No hospital visits since last care coordination call/Pharmacist visit.   No medication changes indicated   BP Readings from Last 3 Encounters:  01/23/22 132/82  12/21/21 (!) 158/82  11/30/21 122/60    Lab Results  Component Value Date   HGBA1C 7.2 (H) 01/23/2022     Patient obtains medications through Adherence Packaging  30 Days   Last adherence delivery included:  Freestyle Libre Kit 2 Sensors- Use as directed every 14 days Lisinopril 10 mg 1 tablet daily (breakfast) Vitamin D3 50 mcg - 1 capsule daily (evening meal) Atorvastatin 20 mg 1 tablet daily (evening meal) Metformin 750 mg 1 tablet daily (breakfast) Famotidine 20 mg daily (Breakfast)  Patient declined (meds) last month: Tresiba- Medication will be delivered before 04-17-2022 per [facility] Celebrex- D/C  Patient is due for next adherence delivery on: 05-16-2022  Called patient and reviewed medications and coordinated delivery.  This delivery to include: Freestyle Libre Kit 2 Sensors- Use as directed every 14 days  Lisinopril 10 mg 1 tablet daily (breakfast) Vitamin D3 50 mcg - 1 capsule daily (evening meal) Atorvastatin 20 mg 1 tablet daily (evening meal) Metformin 750 mg 1 tablet daily (breakfast) Gabapentin 100 mg at breakfast, lunch and evening meal Famotidine 20 mg daily (Breakfast)  Patient declined the following medications: Tresiba- Plenty supply Celebrex- D/C  Patient needs refills for: Refill sent by  [facility] Gabapentin Famotidine  Confirmed delivery date of 05-16-2022, advised patient that pharmacy will contact them the morning of delivery.  NOTES: Patient was prescribed tramadol today but Dr. Wyn Quaker sent RX to [facility] yet. Patient will like a a call once received so she can pick medication up from [place] was informed.  Care Gaps: Covid booster overdue Flu vaccine overdue  Star Rating Drugs: Atorvastatin 20 mg- Last filled 04-12-2022 30 [facility] Ozempic 0.5 mg- Patient assistance Metformin 750 mg- Last filled 04-12-2022 [facility] Lisinopril 10 mg- Last filled 04-12-2022 30 DS [facility]  [place] Clinical Pharmacist Assistant [phone]

## 2022-05-07 DIAGNOSIS — M51369 Other intervertebral disc degeneration, lumbar region without mention of lumbar back pain or lower extremity pain: Secondary | ICD-10-CM | POA: Insufficient documentation

## 2022-05-07 DIAGNOSIS — M47816 Spondylosis without myelopathy or radiculopathy, lumbar region: Secondary | ICD-10-CM | POA: Insufficient documentation

## 2022-05-22 ENCOUNTER — Telehealth: Payer: Self-pay

## 2022-05-22 NOTE — Chronic Care Management (AMB) (Signed)
[employer]ovo Nordisk patient assistance program notification:  120- day supply of Ozempic 0.5 mg was filled on 05/01/2022 and should arrive to the office in 10-14 business days. Patient has 0  refill remaining and enrollment will expire on 06/24/2022.  Billee Cashing, CMA Clinical Pharmacist Assistant [phone]

## 2022-05-25 IMAGING — MG DIGITAL SCREENING BILAT W/ TOMO W/ CAD
8 series · 8 of 24 positions shown · non-contrast
Comparison: Previous exam(s).

CLINICAL DATA: Screening.

EXAM:
DIGITAL SCREENING BILATERAL MAMMOGRAM WITH TOMO AND CAD

[L MLO synth-2D]
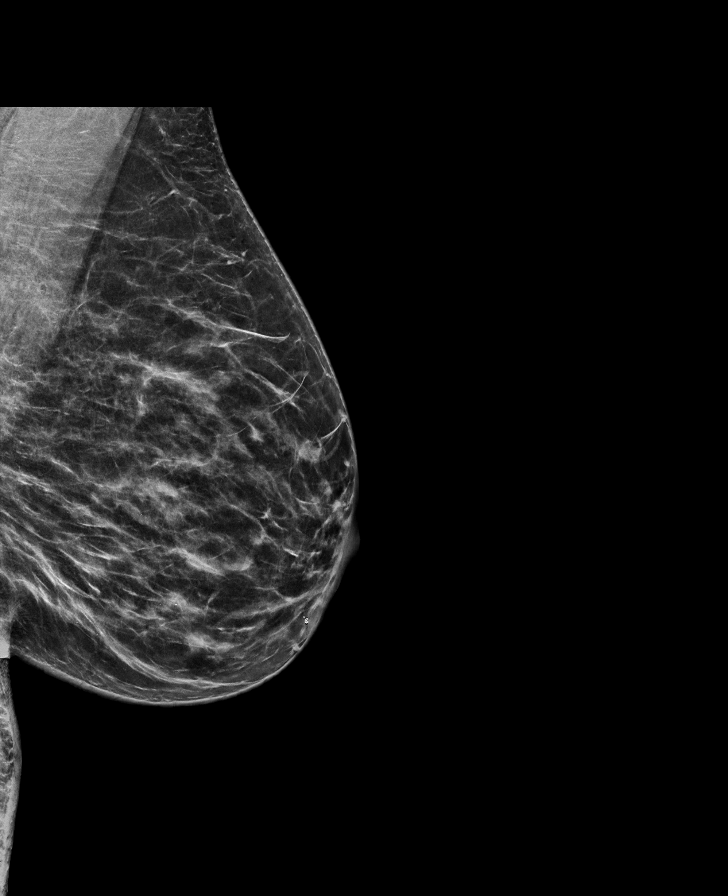

[R CC synth-2D]
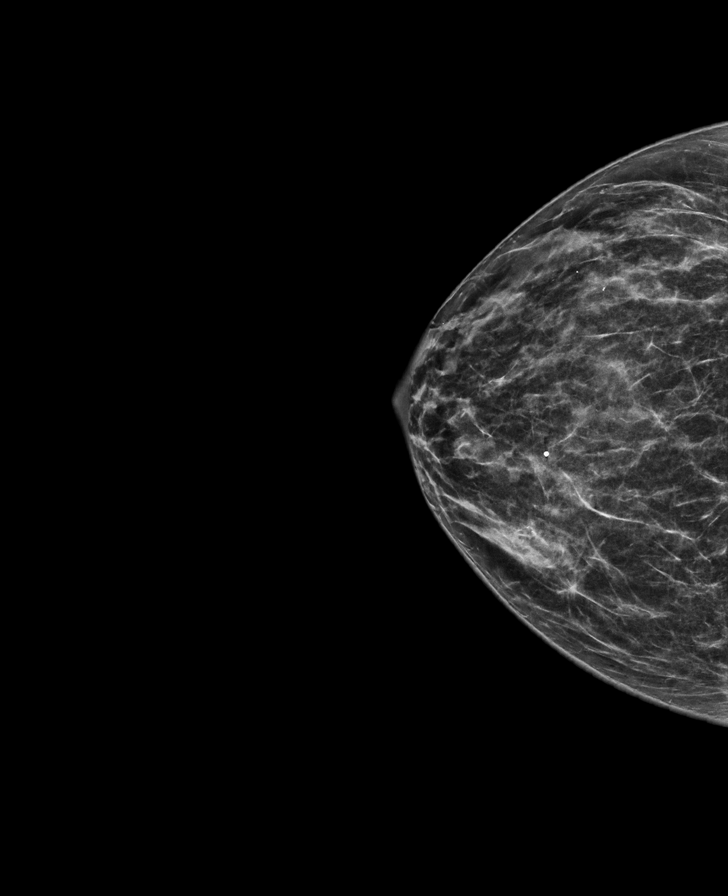

[L CC synth-2D]
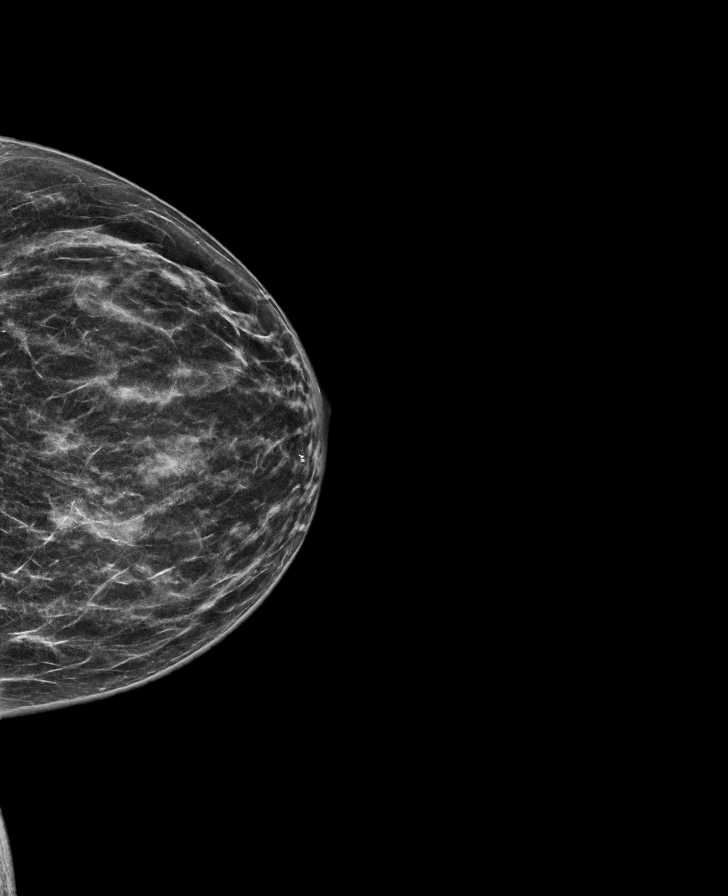

[R MLO synth-2D]
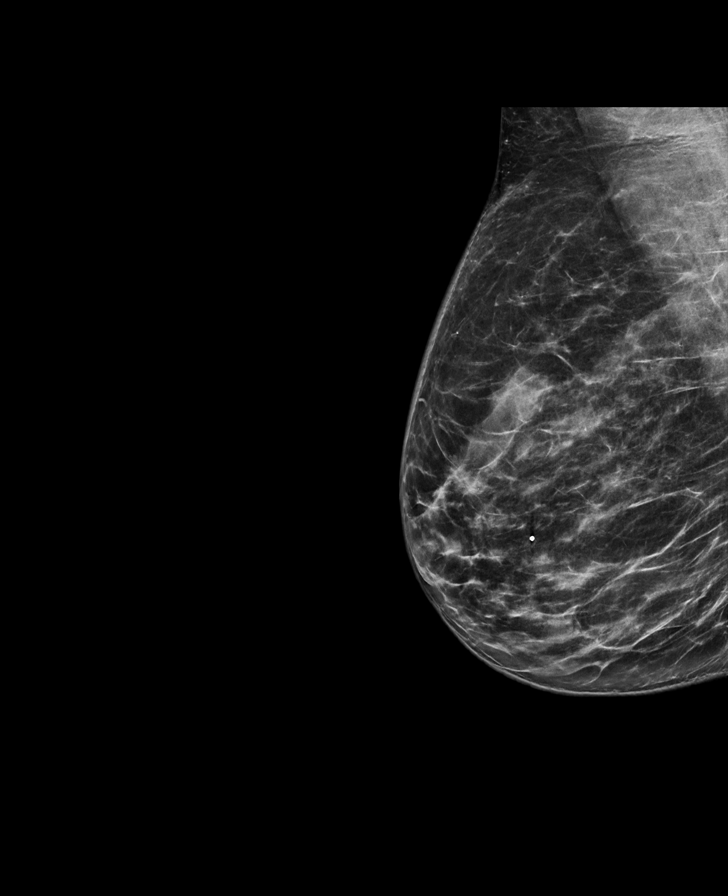

[L CC tomo · tomo slice 29/58.0]
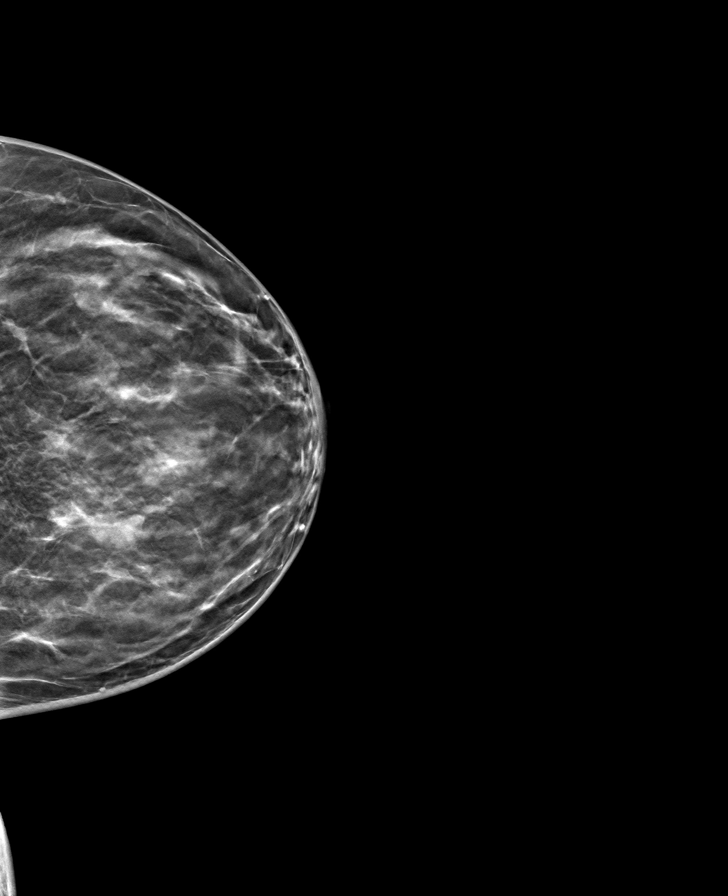

[R CC tomo · tomo slice 29/57.0]
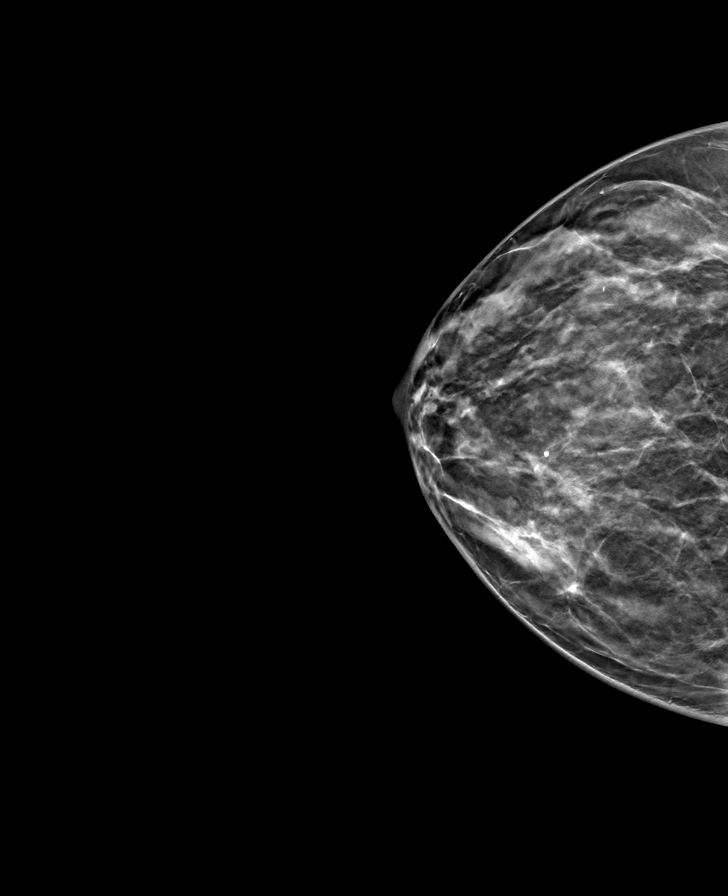

[R MLO tomo · tomo slice 34/67.0]
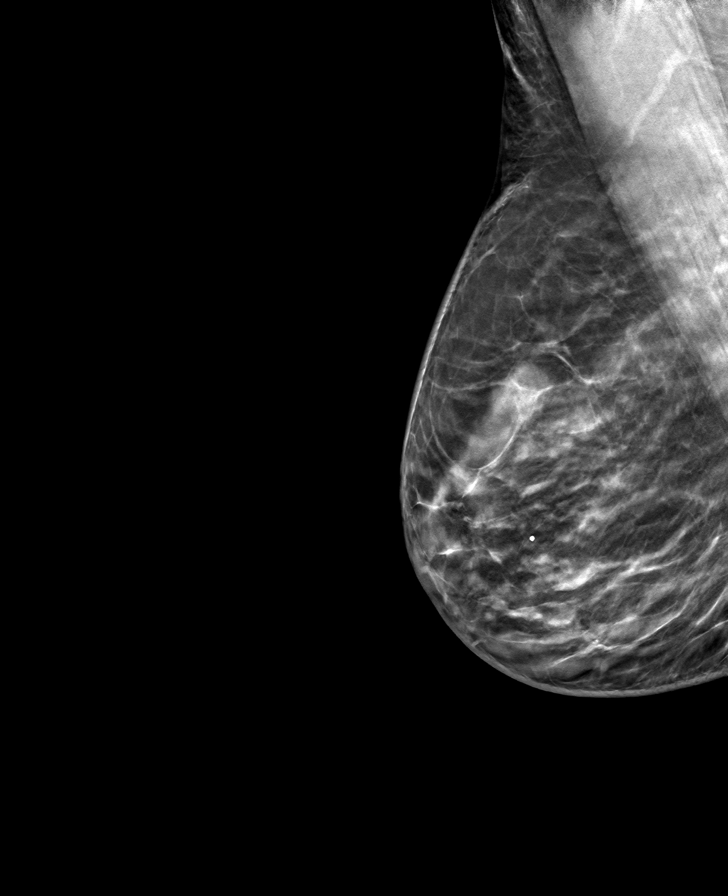

[L MLO tomo · tomo slice 33/66.0]
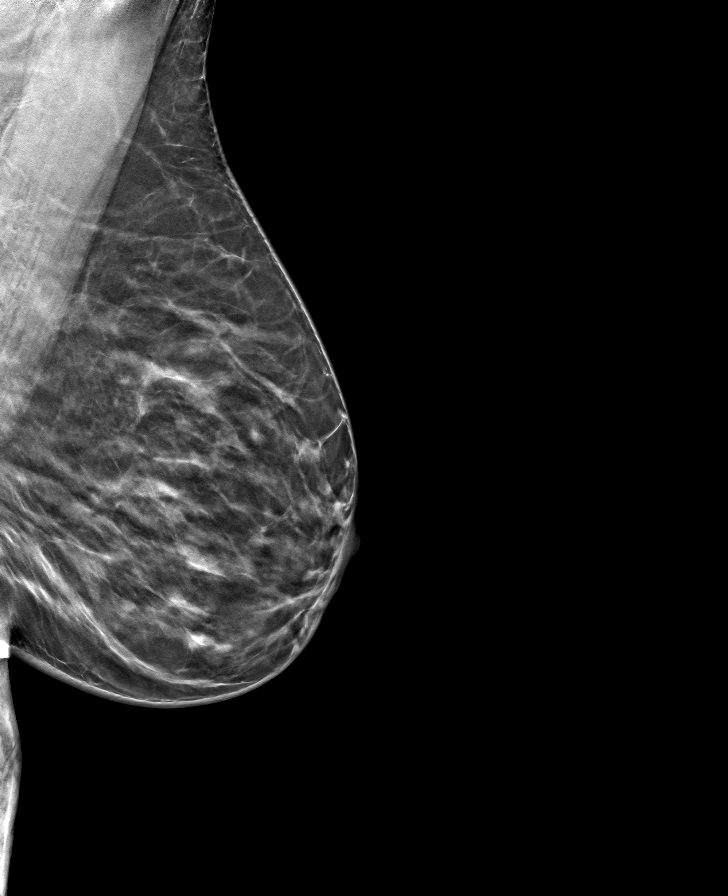

[8 of 24 positions shown; findings below may reference images not displayed]

ACR Breast Density Category b: There are scattered areas of
fibroglandular density.
FINDINGS: There are no findings suspicious for malignancy. Images were
processed with CAD.
IMPRESSION: No mammographic evidence of malignancy. A result letter of this
screening mammogram will be mailed directly to the patient.

RECOMMENDATION:
Screening mammogram in one year. (Code:[id number])

BI-RADS CATEGORY  1: Negative.

## 2022-05-29 ENCOUNTER — Ambulatory Visit (INDEPENDENT_AMBULATORY_CARE_PROVIDER_SITE_OTHER): Payer: HMO | Admitting: Nurse Practitioner

## 2022-05-29 ENCOUNTER — Encounter: Payer: Self-pay | Admitting: Nurse Practitioner

## 2022-05-29 VITALS — BP 122/74 | HR 90 | Temp 98.1°F | Ht 62.0 in | Wt 158.2 lb

## 2022-05-29 DIAGNOSIS — Z01818 Encounter for other preprocedural examination: Secondary | ICD-10-CM

## 2022-05-29 DIAGNOSIS — Z794 Long term (current) use of insulin: Secondary | ICD-10-CM

## 2022-05-29 DIAGNOSIS — I700 Atherosclerosis of aorta: Secondary | ICD-10-CM | POA: Diagnosis not present

## 2022-05-29 DIAGNOSIS — Z23 Encounter for immunization: Secondary | ICD-10-CM | POA: Diagnosis not present

## 2022-05-29 DIAGNOSIS — E113493 Type 2 diabetes mellitus with severe nonproliferative diabetic retinopathy without macular edema, bilateral: Secondary | ICD-10-CM

## 2022-05-29 DIAGNOSIS — E782 Mixed hyperlipidemia: Secondary | ICD-10-CM | POA: Diagnosis not present

## 2022-05-29 LAB — CMP14+EGFR
ALT: 26 IU/L (ref 0–32)
AST: 20 IU/L (ref 0–40)
Albumin/Globulin Ratio: 1.8 (ref 1.2–2.2)
Albumin: 4.4 g/dL (ref 3.9–4.9)
Alkaline Phosphatase: 98 IU/L (ref 44–121)
BUN/Creatinine Ratio: 31 — ABNORMAL HIGH (ref 12–28)
BUN: 19 mg/dL (ref 8–27)
Bilirubin Total: 0.3 mg/dL (ref 0.0–1.2)
CO2: 25 mmol/L (ref 20–29)
Calcium: 9.3 mg/dL (ref 8.7–10.3)
Chloride: 102 mmol/L (ref 96–106)
Creatinine, Ser: 0.61 mg/dL (ref 0.57–1.00)
Globulin, Total: 2.4 g/dL (ref 1.5–4.5)
Glucose: 136 mg/dL — ABNORMAL HIGH (ref 70–99)
Potassium: 3.9 mmol/L (ref 3.5–5.2)
Sodium: 141 mmol/L (ref 134–144)
Total Protein: 6.8 g/dL (ref 6.0–8.5)
eGFR: 98 mL/min/{1.73_m2} (ref >59)

## 2022-05-29 LAB — CBC
Hematocrit: 35.8 % (ref 34.0–46.6)
Hemoglobin: 11.7 g/dL (ref 11.1–15.9)
MCH: 28.1 pg (ref 26.6–33.0)
MCHC: 32.7 g/dL (ref 31.5–35.7)
MCV: 86 fL (ref 79–97)
Platelets: 222 10*3/uL (ref 150–450)
RBC: 4.17 x10E6/uL (ref 3.77–5.28)
RDW: 12.9 % (ref 11.7–15.4)
WBC: 5.3 10*3/uL (ref 3.4–10.8)

## 2022-05-29 LAB — HEMOGLOBIN A1C
Est. average glucose Bld gHb Est-mCnc: 174 mg/dL
Hgb A1c MFr Bld: 7.7 % — ABNORMAL HIGH (ref 4.8–5.6)

## 2022-05-29 NOTE — Progress Notes (Signed)
I,Shannon [name],acting as a Education administrator for Shannon Brine, FNP.,have documented all relevant documentation on the behalf of Shannon Brine, FNP,as directed by  Shannon Brine, FNP while in the presence of Shannon [name], [place].    Subjective:     Patient ID: Shannon [name] , female    DOB: Jul 29, 1955 , 66 y.o.   MRN: [id number]   Chief Complaint  Patient presents with   Diabetes    HPI  Pt is here today for BP & DM f/u.   She states having an upcoming back surgery to place rods to low to mid spine with Dr Rolena Infante. She dropped off clearance paperwork 3 weeks ago. She has been taking gabapentin, ibuprofen and tylenol more lately with her back and would like to have her liver functions checked.  Wt Readings from Last 3 Encounters: 05/29/22 : 158 lb 3.2 oz (71.8 kg) 01/23/22 : 155 lb 12.8 oz (70.7 kg) 12/21/21 : 155 lb 8 oz (70.5 kg)       Diabetes She presents for her follow-up diabetic visit. She has type 2 diabetes mellitus. Her disease course has been stable. Pertinent negatives for hypoglycemia include no confusion, dizziness or nervousness/anxiousness. Pertinent negatives for diabetes include no fatigue, no polydipsia, no polyphagia and no polyuria. There are no hypoglycemic complications. Symptoms are stable. There are no diabetic complications. Risk factors for coronary artery disease include diabetes mellitus, hypertension and obesity. Current diabetic treatment includes oral agent (dual therapy). She is compliant with treatment all of the time (she has only been taking 0.25 mg weekly, trying to spread out her medication when she did not have insurance). She is following a generally healthy diet. When asked about meal planning, she reported none. She has not had a previous visit with a dietitian. She participates in exercise every other day. Her home blood glucose trend is increasing steadily. Her overall blood glucose range is 140-180 mg/dl. (She wears the [place].   Blood sugar had one  high of 175, she has been having several low readings 61-69. ) An ACE inhibitor/angiotensin II receptor blocker is being taken. She does not see a podiatrist.Eye exam current: not done at this time, needs a new opthamologist.     Past Medical History:  Diagnosis Date   COVID-19 virus infection 10/13/2018   Diabetes mellitus without complication ([place])    Hypercholesteremia    Sleep apnea    Newly diagnosed in September 2021     Family History  Problem Relation Age of Onset   Hypertension Mother    Diabetes Mother    Stroke Mother    Diabetes Father    Hypertension Father    Stroke Father    Hypertension Son    Diabetes Son    Breast cancer Neg Hx      Current Outpatient Medications:    atorvastatin (LIPITOR) 20 MG tablet, TAKE ONE TABLET BY MOUTH EVERY EVENING with A meal, Disp: 90 tablet, Rfl: 1   blood glucose meter kit and supplies KIT, Dispense based on patient and insurance preference. Use 3 times daily as directed to check blood sugar. Dx code e11.65, Disp: 1 each, Rfl: 9   Blood Glucose Monitoring Suppl (ONE TOUCH ULTRA MINI) w/Device KIT, 1 each by Does not apply route 4 (four) times daily -  before meals and at bedtime., Disp: 1 kit, Rfl: 0   Cholecalciferol (VITAMIN D-3 PO), Take 1 capsule by mouth daily. 2000 units, Disp: , Rfl:    Continuous Blood Gluc Receiver (FREESTYLE  LIBRE 14 DAY READER) DEVI, USE TO CHECK BLOOD SUGARS FOUR TIMES A DAY DX:E11.65, Disp: 1 each, Rfl: 1   Continuous Blood Gluc Sensor (FREESTYLE LIBRE 2 SENSOR) MISC, USE TO check blood glucose AS DIRECTED AND CHANGE sensor every 14 DAYS, Disp: 3 each, Rfl: 1   cyclobenzaprine (FLEXERIL) 5 MG tablet, SMARTSIG:1 Tablet(s) By Mouth Every Evening PRN, Disp: , Rfl:    famotidine (PEPCID) 20 MG tablet, Take 1 tablet by mouth every morning., Disp: , Rfl:    gabapentin (NEURONTIN) 100 MG capsule, Take 1 tablet by mouth 3 (three) times daily., Disp: , Rfl:    glucagon (GLUCAGEN HYPOKIT) 1 MG SOLR injection,  Inject 1 mg into the vein once as needed for up to 1 dose for low blood sugar., Disp: 1 each, Rfl: 5   metFORMIN (GLUCOPHAGE XR) 750 MG 24 hr tablet, Take 1 tablet (750 mg total) by mouth daily with breakfast., Disp: 90 tablet, Rfl: 1   traMADol (ULTRAM) 50 MG tablet, Take 50 mg by mouth 3 (three) times daily as needed., Disp: , Rfl:    TRESIBA FLEXTOUCH 100 UNIT/ML FlexTouch Pen, inject 15 units AT bedtime, Disp: 15 mL, Rfl: 1   celecoxib (CELEBREX) 200 MG capsule, Take 1 capsule by mouth every morning. (Patient not taking: Reported on 05/29/2022), Disp: , Rfl:    glucose blood (FREESTYLE TEST STRIPS) test strip, Use as instructed (Patient not taking: Reported on 05/29/2022), Disp: 100 each, Rfl: 12   glucose blood (FREESTYLE TEST STRIPS) test strip, Use as instructed (Patient not taking: Reported on 05/29/2022), Disp: 100 each, Rfl: 12   glucose blood test strip, Use as instructed, Disp: 100 each, Rfl: 5   glucose blood test strip, Use as instructed to check blood sugar 3 times a day. Dx code e11.65 (Patient not taking: Reported on 05/29/2022), Disp: 100 each, Rfl: 12   Lancets (ONETOUCH ULTRASOFT) lancets, Use as instructed to check blood sugars 4 times daily E11.9 (Patient not taking: Reported on 05/29/2022), Disp: 300 each, Rfl: 3   lisinopril (ZESTRIL) 10 MG tablet, TAKE ONE TABLET BY MOUTH EVERY MORNING, Disp: 90 tablet, Rfl: 0   Semaglutide, 1 MG/DOSE, 4 MG/3ML SOPN, Inject 1 mg into the skin once a week., Disp: 9 mL, Rfl: 1   No Known Allergies   Review of Systems  Constitutional: Negative.  Negative for fatigue.  Respiratory: Negative.    Cardiovascular: Negative.   Endocrine: Negative for polydipsia, polyphagia and polyuria.  Neurological: Negative.  Negative for dizziness.  Psychiatric/Behavioral: Negative.  Negative for confusion. The patient is not nervous/anxious.      Today's Vitals   05/29/22 1041  BP: 122/74  Pulse: 90  Temp: 98.1 F (36.7 C)  SpO2: 98%  Weight: 158 lb 3.2  oz (71.8 kg)  Height: _0  (1.575 m)   Body mass index is 28.94 kg/m.  Wt Readings from Last 3 Encounters:  05/29/22 158 lb 3.2 oz (71.8 kg)  01/23/22 155 lb 12.8 oz (70.7 kg)  12/21/21 155 lb 8 oz (70.5 kg)    Objective:  Physical Exam Vitals reviewed.  Constitutional:      General: She is not in acute distress.    Appearance: Normal appearance. She is well-developed.  Cardiovascular:     Rate and Rhythm: Normal rate and regular rhythm.     Pulses: Normal pulses.     Heart sounds: Normal heart sounds. No murmur heard. Pulmonary:     Effort: Pulmonary effort is normal. No respiratory distress.  Breath sounds: Normal breath sounds. No wheezing.  Chest:     Chest wall: No tenderness.  Musculoskeletal:        General: Normal range of motion.  Skin:    General: Skin is warm and dry.     Capillary Refill: Capillary refill takes less than 2 seconds.  Neurological:     General: No focal deficit present.     Mental Status: She is alert and oriented to person, place, and time.  Psychiatric:        Mood and Affect: Mood normal.        Behavior: Behavior normal.        Thought Content: Thought content normal.        Judgment: Judgment normal.         Assessment And Plan:     1. 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 Aortic atherosclerosis (HCC) Comments: Continue statin, tolerating well. - CMP14+EGFR  3. Mixed hyperlipidemia Comments: Continue statin, tolerating well.  4. Need for influenza vaccination - Flu Vaccine QUAD High Dose(Fluad)  5. Pre-operative clearance Comments: She is cleared medically and cardiac for her sugery with moderate risk due to history of Type 2 diabetes - CBC - Protime-INR     Patient was given opportunity to ask questions. Patient verbalized  understanding of the plan and was able to repeat key elements of the plan. All questions were answered to their satisfaction.  Shannon Brine, FNP   I, Shannon Brine, FNP, have reviewed all documentation for this visit. The documentation on 05/29/22 for the exam, diagnosis, procedures, and orders are all accurate and complete.   IF YOU HAVE BEEN REFERRED TO A SPECIALIST, IT MAY TAKE 1-2 WEEKS TO SCHEDULE/PROCESS THE REFERRAL. IF YOU HAVE NOT HEARD FROM US/SPECIALIST IN TWO WEEKS, PLEASE GIVE [place] A CALL AT [phone].   THE PATIENT IS ENCOURAGED TO PRACTICE SOCIAL DISTANCING DUE TO THE COVID-19 PANDEMIC.

## 2022-05-29 NOTE — Patient Instructions (Signed)

## 2022-05-30 ENCOUNTER — Other Ambulatory Visit: Payer: Self-pay | Admitting: Nurse Practitioner

## 2022-05-30 LAB — PROTIME-INR
INR: 1 (ref 0.9–1.2)
Prothrombin Time: 10.4 s (ref 9.1–12.0)

## 2022-06-04 ENCOUNTER — Telehealth: Payer: Self-pay

## 2022-06-04 NOTE — Chronic Care Management (AMB) (Signed)
Chronic Care Management Pharmacy Assistant   Name: Shannon [name]  MRN: [id number] DOB: 06-26-1955  Reason for Encounter: Medication Review/ Medication Coordination call   Recent office visits:  05/29/2022- [name], FNP (PCP)- Follow up visit No medication changes  Recent consult visits:  05/07/2022- Hector Shade, MD (Ortho)- Follow up visit Increase gabapentin from 100 mg 3 times daily to 300 mg 3 times daily. Start cyclobenzaprine 5 mg as needed at night.  Consider switching to Lyrica versus uptitration of gabapentin versus addition of Cymbalta as needed.   Hospital visits:  None in previous 6 months  Medications: Outpatient Encounter Medications as of 06/04/2022  Medication Sig   atorvastatin (LIPITOR) 20 MG tablet TAKE ONE TABLET BY MOUTH EVERY EVENING with A meal   blood glucose meter kit and supplies KIT Dispense based on patient and insurance preference. Use 3 times daily as directed to check blood sugar. Dx code e11.65   Blood Glucose Monitoring Suppl (ONE TOUCH ULTRA MINI) w/Device KIT 1 each by Does not apply route 4 (four) times daily -  before meals and at bedtime.   celecoxib (CELEBREX) 200 MG capsule Take 1 capsule by mouth every morning. (Patient not taking: Reported on 05/29/2022)   Cholecalciferol (VITAMIN D-3 PO) Take 1 capsule by mouth daily. 2000 units   Continuous Blood Gluc Receiver (FREESTYLE LIBRE 14 DAY READER) DEVI USE TO CHECK BLOOD SUGARS FOUR TIMES A DAY DX:E11.65   Continuous Blood Gluc Sensor (FREESTYLE LIBRE 2 SENSOR) MISC USE TO check blood glucose AS DIRECTED AND CHANGE sensor every 14 DAYS   cyclobenzaprine (FLEXERIL) 5 MG tablet SMARTSIG:1 Tablet(s) By Mouth Every Evening PRN   famotidine (PEPCID) 20 MG tablet Take 1 tablet by mouth every morning.   gabapentin (NEURONTIN) 100 MG capsule Take 1 tablet by mouth 3 (three) times daily.   glucagon (GLUCAGEN HYPOKIT) 1 MG SOLR injection Inject 1 mg into the vein once as needed for up to 1 dose  for low blood sugar.   glucose blood (FREESTYLE TEST STRIPS) test strip Use as instructed (Patient not taking: Reported on 05/29/2022)   glucose blood (FREESTYLE TEST STRIPS) test strip Use as instructed (Patient not taking: Reported on 05/29/2022)   glucose blood test strip Use as instructed   glucose blood test strip Use as instructed to check blood sugar 3 times a day. Dx code e11.65 (Patient not taking: Reported on 05/29/2022)   Lancets (ONETOUCH ULTRASOFT) lancets Use as instructed to check blood sugars 4 times daily E11.9 (Patient not taking: Reported on 05/29/2022)   lisinopril (ZESTRIL) 10 MG tablet Take 1 tablet (10 mg total) by mouth every morning.   metFORMIN (GLUCOPHAGE XR) 750 MG 24 hr tablet Take 1 tablet (750 mg total) by mouth daily with breakfast.   traMADol (ULTRAM) 50 MG tablet Take 50 mg by mouth 3 (three) times daily as needed.   TRESIBA FLEXTOUCH 100 UNIT/ML FlexTouch Pen inject 15 units AT bedtime   No facility-administered encounter medications on file as of 06/04/2022.   Reviewed chart for medication changes ahead of medication coordination call.   BP Readings from Last 3 Encounters:  05/29/22 122/74  01/23/22 132/82  12/21/21 (!) 158/82    Lab Results  Component Value Date   HGBA1C 7.7 (H) 05/29/2022     Called patient to review medications and coordinate delivery, no answer left message to return call.  Malecca Hicks, CMA was able to get in touch with patient to review medications and coordinate delivery, see  next encounter for medication coordination details.   Care Gaps: Covid booster overdue Last AWV- 11/30/2021, next scheduled 12/05/2022 Last Mammogram- 01/16/2023 Last DM Eye Exam- 02/06/2022 Last Colonoscopy- 04/10/2013, next due 04/11/2023   Star Rating Drugs: Atorvastatin 20 mg- Last filled 05/11/2022 30 [facility] Ozempic 0.5 mg- Patient assistance Metformin 750 mg- Last filled 05/11/2022  30 DS [facility] Lisinopril 10 mg- Last filled 05/11/2022   30 [facility]   [name], [place] Pharmacist Assistant [phone]

## 2022-06-05 ENCOUNTER — Other Ambulatory Visit: Payer: Self-pay

## 2022-06-05 DIAGNOSIS — E119 Type 2 diabetes mellitus without complications: Secondary | ICD-10-CM

## 2022-06-05 MED ORDER — OZEMPIC (0.25 OR 0.5 MG/DOSE) 2 MG/1.5ML ~~LOC~~ SOPN: 1.0000 mg | PEN_INJECTOR | SUBCUTANEOUS | 1 refills | Status: DC

## 2022-06-06 ENCOUNTER — Telehealth: Payer: Self-pay

## 2022-06-06 NOTE — Progress Notes (Signed)
  Patient obtains medications through Adherence Packaging  30 Days   Last adherence delivery included:  Freestyle Libre Kit 2 Sensors- Use as directed every 14 days Lisinopril 10 mg 1 tablet daily (breakfast) Vitamin D3 50 mcg - 1 capsule daily (evening meal) Atorvastatin 20 mg 1 tablet daily (evening meal) Metformin 750 mg 1 tablet daily (breakfast) Gabapentin 100 mg at breakfast, lunch and evening meal Famotidine 20 mg daily (Breakfast)  Patient declined (meds) last month:  Tresiba- Plenty supply Celebrex- D/C  Patient is due for next adherence delivery on: 06-14-2022  Called patient and reviewed medications and coordinated delivery.  This delivery to include: Freestyle Libre Kit 2 Sensors- Use as directed every 14 days Lisinopril 10 mg 1 tablet daily (breakfast) Vitamin D3 50 mcg - 1 capsule daily (evening meal) Atorvastatin 20 mg 1 tablet daily (evening meal) Metformin 750 mg 1 tablet daily (breakfast) Gabapentin 100 mg at breakfast, lunch and evening meal Famotidine 20 mg daily (Breakfast) Tramadol 50 mg 3 times daily PRN (Vials) Tyler Aas- Add to delivery  No acute/short fill needed  Patient declined the following medications: Ozempic- PAP Celebrex- D/C  Patient needs refills for: Sent request to PCP Lisinopril Tramadol  Confirmed delivery date of 06-14-2022 advised patient that pharmacy will contact them the morning of delivery.  [place] Pharmacist Assistant [phone]

## 2022-06-07 ENCOUNTER — Other Ambulatory Visit: Payer: Self-pay | Admitting: Nurse Practitioner

## 2022-06-07 DIAGNOSIS — Z794 Long term (current) use of insulin: Secondary | ICD-10-CM

## 2022-06-12 ENCOUNTER — Other Ambulatory Visit: Payer: Self-pay | Admitting: Nurse Practitioner

## 2022-06-12 DIAGNOSIS — E119 Type 2 diabetes mellitus without complications: Secondary | ICD-10-CM

## 2022-06-12 MED ORDER — SEMAGLUTIDE (1 MG/DOSE) 4 MG/3ML ~~LOC~~ SOPN: 1.0000 mg | PEN_INJECTOR | SUBCUTANEOUS | 1 refills | Status: DC

## 2022-06-14 ENCOUNTER — Telehealth: Payer: Self-pay

## 2022-06-14 NOTE — Progress Notes (Signed)
06-14-2022: Patient was informed Ozempic is ready for pickup and she can come by today or when the office opens back up on 06-20-2022.  Huey Romans [facility] Clinical Pharmacist Assistant [phone]

## 2022-06-27 ENCOUNTER — Ambulatory Visit (HOSPITAL_COMMUNITY): Payer: Self-pay | Admitting: Orthopedic Surgery

## 2022-06-27 NOTE — Patient Instructions (Signed)

## 2022-06-27 NOTE — Progress Notes (Unsigned)
PATIENT: Shannon [name] DOB: 1955-06-27  REASON FOR VISIT: follow up HISTORY FROM: patient  No chief complaint on file.    HISTORY OF PRESENT ILLNESS:  06/27/22 ALL:  Shannon [name] returns for follow up for OSA on CPAP.   12/21/2021 ALL: Shannon [name] returns for follow up for OSA on CPAP. She reports that she is having difficulty with compliance. 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 She admits that she has not put therapy on when she gets home. She is napping during the day. She denies difficulty with machine or supplies. She is having significant back pain with shooting pain in left left. She is followed by PCP and ortho.     12/15/2020 ALL: Shannon [name] is a 67 y.o. female here today for follow up for OSA on CPAP. She is doing well with new mask. She has not had any difficulty using CPAP. She does feel a little better rested in the mornings.        08/29/2020 SA: Shannon [name] is a 67 year old right-handed woman with an underlying medical history of hypertension, hyperlipidemia, diabetes, diabetic retinopathy and overweight state, who presents for follow-up consultation of her obstructive sleep apnea after interim testing and starting AutoPap therapy.  The patient is unaccompanied today.  I first met her at the request of her primary care nurse practitioner on 01/06/2020, at which time she reported snoring and excessive daytime somnolence.  She was encouraged to seek evaluation for sleep apnea by her retina specialist who had noted macular edema and felt it could be in part secondary to underlying sleep apnea.  She was advised to proceed with sleep testing.  She had a home sleep test on 03/02/2020 which indicated overall mild sleep apnea with an AHI of 7.7/h, O2 nadir of 89%.  Given her medical history and daytime somnolence reported she was encouraged to start AutoPap therapy.  Set up date was 05/10/20.   Today, 08/29/20: I reviewed her AutoPap compliance data from 07/26/2020 through 08/24/2020,  which is a total of 30 days, during which time she used her machine only 14 days with percent use days greater than 4 hours at only 3%, indicating low compliance, average AHI 11.6/h, 95th percentile of pressure at 5.5 cm, leak for the 95th percentile at 13.7 L/min, pressure range of 5 to 11 cm with EPR of 3.   I reviewed her AutoPap compliance data from 06/26/2020 through 07/25/2020, which is a total of 30 days, during which time she used her machine 24 days with percent use days greater than 4 hours at 40%, indicating suboptimal compliance with an average usage of 2 hours and 50 minutes for days on treatment, residual AHI 4.2/h, 95th percentile of pressure at 8.4 cm with a range of 5 to 11 cm, leak on the higher side with a 95th percentile at 17.5 L/min.  Her compliance in the month of late November through late December was similar, percent use days greater than 4 hours was 33%.  She reports that she works third shift.  She tries to keep the mask on at least 4 hours daily.  She generally sleeps between 1 and 6 PM.  She has to be at work at 7 PM and works 12 hours.  She reports that she feels a little better, sleep quality and energy are a little better.  She reports that she had an eye examination and her right eye looks a little better.  I reviewed the note from  Dr. Deloria Lair from 08/17/2020.  She is motivated to continue with treatment.  She reports using a nasal mask.  She does breathe at times through her mouth and is wondering if she could try a full facemask.  She talked to [facility] regarding her low readings messages on her machine and she was instructed to troubleshoot at home by unplugging and restarting.  She still gets the low readings.   REVIEW OF SYSTEMS: Out of a complete 14 system review of symptoms, the patient complains only of the following symptoms, and all other reviewed systems are negative.  ESS: 4  ALLERGIES: No Known Allergies  HOME MEDICATIONS: Outpatient Medications Prior to  Visit  Medication Sig Dispense Refill   atorvastatin (LIPITOR) 20 MG tablet TAKE ONE TABLET BY MOUTH EVERY EVENING with A meal 90 tablet 1   blood glucose meter kit and supplies KIT Dispense based on patient and insurance preference. Use 3 times daily as directed to check blood sugar. Dx code e11.65 1 each 9   Blood Glucose Monitoring Suppl (ONE TOUCH ULTRA MINI) w/Device KIT 1 each by Does not apply route 4 (four) times daily -  before meals and at bedtime. 1 kit 0   celecoxib (CELEBREX) 200 MG capsule Take 1 capsule by mouth every morning. (Patient not taking: Reported on 05/29/2022)     Cholecalciferol (VITAMIN D-3 PO) Take 1 capsule by mouth daily. 2000 units     Continuous Blood Gluc Receiver (FREESTYLE LIBRE 14 DAY READER) DEVI USE TO CHECK BLOOD SUGARS FOUR TIMES A DAY DX:E11.65 1 each 1   Continuous Blood Gluc Sensor (FREESTYLE LIBRE 2 SENSOR) MISC USE TO check blood glucose AS DIRECTED AND CHANGE sensor every 14 DAYS 3 each 1   cyclobenzaprine (FLEXERIL) 5 MG tablet SMARTSIG:1 Tablet(s) By Mouth Every Evening PRN     famotidine (PEPCID) 20 MG tablet Take 1 tablet by mouth every morning.     gabapentin (NEURONTIN) 100 MG capsule Take 1 tablet by mouth 3 (three) times daily.     glucagon (GLUCAGEN HYPOKIT) 1 MG SOLR injection Inject 1 mg into the vein once as needed for up to 1 dose for low blood sugar. 1 each 5   glucose blood (FREESTYLE TEST STRIPS) test strip Use as instructed (Patient not taking: Reported on 05/29/2022) 100 each 12   glucose blood (FREESTYLE TEST STRIPS) test strip Use as instructed (Patient not taking: Reported on 05/29/2022) 100 each 12   glucose blood test strip Use as instructed 100 each 5   glucose blood test strip Use as instructed to check blood sugar 3 times a day. Dx code e11.65 (Patient not taking: Reported on 05/29/2022) 100 each 12   Lancets (ONETOUCH ULTRASOFT) lancets Use as instructed to check blood sugars 4 times daily E11.9 (Patient not taking: Reported on  05/29/2022) 300 each 3   lisinopril (ZESTRIL) 10 MG tablet TAKE ONE TABLET BY MOUTH EVERY MORNING 90 tablet 0   metFORMIN (GLUCOPHAGE XR) 750 MG 24 hr tablet Take 1 tablet (750 mg total) by mouth daily with breakfast. 90 tablet 1   Semaglutide, 1 MG/DOSE, 4 MG/3ML SOPN Inject 1 mg into the skin once a week. 9 mL 1   traMADol (ULTRAM) 50 MG tablet Take 50 mg by mouth 3 (three) times daily as needed.     TRESIBA FLEXTOUCH 100 UNIT/ML FlexTouch Pen inject 15 units AT bedtime 15 mL 1   No facility-administered medications prior to visit.    PAST MEDICAL HISTORY: Past  Medical History:  Diagnosis Date   COVID-19 virus infection 10/13/2018   Diabetes mellitus without complication (HCC)    Hypercholesteremia    Sleep apnea    Newly diagnosed in September 2021    PAST SURGICAL HISTORY: Past Surgical History:  Procedure Laterality Date   ABDOMINAL HYSTERECTOMY     CATARACT EXTRACTION Bilateral    CESAREAN SECTION     MYOMECTOMY      FAMILY HISTORY: Family History  Problem Relation Age of Onset   Hypertension Mother    Diabetes Mother    Stroke Mother    Diabetes Father    Hypertension Father    Stroke Father    Hypertension Son    Diabetes Son    Breast cancer Neg Hx     SOCIAL HISTORY: Social History   Socioeconomic History   Marital status: Single    Spouse name: Not on file   Number of children: Not on file   Years of education: Not on file   Highest education level: Not on file  Occupational History   Not on file  Tobacco Use   Smoking status: Former    Types: Cigarettes    Quit date: 2006    Years since quitting: 18.0   Smokeless tobacco: Never   Tobacco comments:    quit 10 yrs  Vaping Use   Vaping Use: Never used  Substance and Sexual Activity   Alcohol use: Yes    Comment: 1 glass of wine sometimes daily    Drug use: Never   Sexual activity: Not on file  Other Topics Concern   Not on file  Social History Narrative   Not on file   Social  Determinants of Health   Financial Resource Strain: Low Risk  (11/30/2021)   Overall Financial Resource Strain (CARDIA)    Difficulty of Paying Living Expenses: Not hard at all  Food Insecurity: No Food Insecurity (11/30/2021)   Hunger Vital Sign    Worried About Running Out of Food in the Last Year: Never true    [place] in the Last Year: Never true  Transportation Needs: No Transportation Needs (11/30/2021)   PRAPARE - Hydrologist (Medical): No    Lack of Transportation (Non-Medical): No  Physical Activity: Sufficiently Active (11/30/2021)   Exercise Vital Sign    Days of Exercise per Week: 4 days    Minutes of Exercise per Session: 50 min  Stress: No Stress Concern Present (11/30/2021)   [place]    Feeling of Stress : Not at all  Social Connections: Not on file  Intimate Partner Violence: Not on file     PHYSICAL EXAM  There were no vitals filed for this visit.  There is no height or weight on file to calculate BMI.  Generalized: Well developed, in no acute distress  Cardiology: normal rate and rhythm, no murmur noted Respiratory: clear to auscultation bilaterally  Neurological examination  Mentation: Alert oriented to time, place, history taking. Follows all commands speech and language fluent Cranial nerve II-XII: Pupils were equal round reactive to light. Extraocular movements were full, visual field were full  Motor: The motor testing reveals 5 over 5 strength of all 4 extremities. Good symmetric motor tone is noted throughout.  Gait and station: Gait is guarded, left limp, stable without assistive device    DIAGNOSTIC DATA (LABS, IMAGING, TESTING) - I reviewed patient records, labs, notes, testing and  imaging myself where available.      No data to display           Lab Results  Component Value Date   WBC 5.3 05/29/2022   HGB 11.7 05/29/2022   HCT 35.8  05/29/2022   MCV 86 05/29/2022   PLT 222 05/29/2022      Component Value Date/Time   NA 141 05/29/2022 1133   K 3.9 05/29/2022 1133   CL 102 05/29/2022 1133   CO2 25 05/29/2022 1133   GLUCOSE 136 (H) 05/29/2022 1133   GLUCOSE 183 (H) 10/18/2018 0550   BUN 19 05/29/2022 1133   CREATININE 0.61 05/29/2022 1133   CALCIUM 9.3 05/29/2022 1133   PROT 6.8 05/29/2022 1133   ALBUMIN 4.4 05/29/2022 1133   AST 20 05/29/2022 1133   ALT 26 05/29/2022 1133   ALKPHOS 98 05/29/2022 1133   BILITOT 0.3 05/29/2022 1133   GFRNONAA 93 05/23/2020 1139   GFRAA 107 05/23/2020 1139   Lab Results  Component Value Date   CHOL 127 01/23/2022   HDL 65 01/23/2022   LDLCALC 49 01/23/2022   TRIG 63 01/23/2022   CHOLHDL 2.0 01/23/2022   Lab Results  Component Value Date   HGBA1C 7.7 (H) 05/29/2022   Lab Results  Component Value Date   VITAMINB12 1,789 (H) 09/28/2021   Lab Results  Component Value Date   TSH 0.587 09/28/2021     ASSESSMENT AND PLAN 67 y.o. year old female  has a past medical history of COVID-19 virus infection (10/13/2018), Diabetes mellitus without complication ([place]), Hypercholesteremia, and Sleep apnea. here with   No diagnosis found.   Shannon [name] is doing well on CPAP therapy. Compliance report reveals suboptimal compliance. She feels this is related to working nights and vacations. I will repeat download in 6 weeks to assess compliance. She was encouraged to continue using CPAP nightly and for greater than 4 hours each night. We will update supply orders as indicated. Risks of untreated sleep apnea review and education materials provided.She was encouraged to follow up closely with PCP and ortho for current back pain. Healthy lifestyle habits encouraged. She will follow up in 6 months, sooner if needed. She  verbalizes understanding and agreement with this plan.    No orders of the defined types were placed in this encounter.     No orders of the defined types were  placed in this encounter.      Debbora Presto, FNP-C 06/27/2022, 4:28 PM [facility] [address], [place], [place] [address] [phone]

## 2022-06-28 ENCOUNTER — Ambulatory Visit (INDEPENDENT_AMBULATORY_CARE_PROVIDER_SITE_OTHER): Payer: PPO | Admitting: Family Medicine

## 2022-06-28 ENCOUNTER — Encounter: Payer: Self-pay | Admitting: Family Medicine

## 2022-06-28 ENCOUNTER — Telehealth: Payer: Self-pay

## 2022-06-28 VITALS — BP 169/85 | HR 99 | Ht 63.0 in | Wt 157.5 lb

## 2022-06-28 DIAGNOSIS — G4733 Obstructive sleep apnea (adult) (pediatric): Secondary | ICD-10-CM

## 2022-06-28 NOTE — Telephone Encounter (Signed)
Called Pt and left a message regarding CPAP Machine. Pt didn't answer, waiting on call back.

## 2022-07-02 DIAGNOSIS — M5451 Vertebrogenic low back pain: Secondary | ICD-10-CM | POA: Diagnosis not present

## 2022-07-02 DIAGNOSIS — M5416 Radiculopathy, lumbar region: Secondary | ICD-10-CM | POA: Diagnosis not present

## 2022-07-04 ENCOUNTER — Other Ambulatory Visit: Payer: Self-pay

## 2022-07-04 ENCOUNTER — Telehealth: Payer: Self-pay

## 2022-07-04 DIAGNOSIS — Z794 Long term (current) use of insulin: Secondary | ICD-10-CM

## 2022-07-04 MED ORDER — SEMAGLUTIDE (1 MG/DOSE) 4 MG/3ML ~~LOC~~ SOPN: 1.0000 mg | PEN_INJECTOR | SUBCUTANEOUS | 1 refills | Status: DC

## 2022-07-04 MED ORDER — FAMOTIDINE 20 MG PO TABS: 20.0000 mg | ORAL_TABLET | Freq: Every morning | ORAL | 1 refills | Status: DC

## 2022-07-04 NOTE — Progress Notes (Cosign Needed Addendum)
Care Management & Coordination Services Pharmacy Team  Reason for Encounter: Medication coordination and delivery  Contacted patient on 07/04/2022 to discuss medications   Recent office visits:  None  Recent consult visits:  06-28-2022 Debbora Presto, NP (Neurology). Follow up visit.  06-27-2022 Melina Schools, MD (Neurosurgery). Preop visit.  Hospital visits:  None in previous 6 months  Medications: Outpatient Encounter Medications as of 07/04/2022  Medication Sig   atorvastatin (LIPITOR) 20 MG tablet TAKE ONE TABLET BY MOUTH EVERY EVENING with A meal   blood glucose meter kit and supplies KIT Dispense based on patient and insurance preference. Use 3 times daily as directed to check blood sugar. Dx code e11.65 (Patient not taking: Reported on 06/28/2022)   Blood Glucose Monitoring Suppl (ONE TOUCH ULTRA MINI) w/Device KIT 1 each by Does not apply route 4 (four) times daily -  before meals and at bedtime.   celecoxib (CELEBREX) 200 MG capsule Take 1 capsule by mouth every morning. (Patient not taking: Reported on 05/29/2022)   Cholecalciferol (VITAMIN D-3 PO) Take 1 capsule by mouth daily. 2000 units   Continuous Blood Gluc Receiver (FREESTYLE LIBRE 14 DAY READER) DEVI USE TO CHECK BLOOD SUGARS FOUR TIMES A DAY DX:E11.65   Continuous Blood Gluc Sensor (FREESTYLE LIBRE 2 SENSOR) MISC USE TO check blood glucose AS DIRECTED AND CHANGE sensor every 14 DAYS   cyclobenzaprine (FLEXERIL) 5 MG tablet SMARTSIG:1 Tablet(s) By Mouth Every Evening PRN   famotidine (PEPCID) 20 MG tablet Take 1 tablet by mouth every morning.   gabapentin (NEURONTIN) 100 MG capsule Take 1 tablet by mouth 3 (three) times daily.   glucagon (GLUCAGEN HYPOKIT) 1 MG SOLR injection Inject 1 mg into the vein once as needed for up to 1 dose for low blood sugar.   glucose blood (FREESTYLE TEST STRIPS) test strip Use as instructed   glucose blood (FREESTYLE TEST STRIPS) test strip Use as instructed (Patient not taking: Reported on  05/29/2022)   glucose blood test strip Use as instructed (Patient not taking: Reported on 06/28/2022)   glucose blood test strip Use as instructed to check blood sugar 3 times a day. Dx code e11.65 (Patient not taking: Reported on 05/29/2022)   Lancets (ONETOUCH ULTRASOFT) lancets Use as instructed to check blood sugars 4 times daily E11.9 (Patient not taking: Reported on 05/29/2022)   lisinopril (ZESTRIL) 10 MG tablet TAKE ONE TABLET BY MOUTH EVERY MORNING   metFORMIN (GLUCOPHAGE XR) 750 MG 24 hr tablet Take 1 tablet (750 mg total) by mouth daily with breakfast.   Semaglutide, 1 MG/DOSE, 4 MG/3ML SOPN Inject 1 mg into the skin once a week.   traMADol (ULTRAM) 50 MG tablet Take 50 mg by mouth 3 (three) times daily as needed.   TRESIBA FLEXTOUCH 100 UNIT/ML FlexTouch Pen inject 15 units AT bedtime   No facility-administered encounter medications on file as of 07/04/2022.   BP Readings from Last 3 Encounters:  06/28/22 (!) 169/85  05/29/22 122/74  01/23/22 132/82    Pulse Readings from Last 3 Encounters:  06/28/22 99  05/29/22 90  01/23/22 70    Lab Results  Component Value Date/Time   HGBA1C 7.7 (H) 05/29/2022 11:33 AM   HGBA1C 7.2 (H) 01/23/2022 10:54 AM   Lab Results  Component Value Date   CREATININE 0.61 05/29/2022   BUN 19 05/29/2022   GFRNONAA 93 05/23/2020   GFRAA 107 05/23/2020   NA 141 05/29/2022   K 3.9 05/29/2022   CALCIUM 9.3 05/29/2022   CO2  25 05/29/2022     Last adherence delivery date: 06-14-2022  Patient is due for next adherence delivery on: 07-16-2022  Spoke with patient on 07-16-2022 reviewed medications and coordinated delivery.  This delivery to include: Adherence Packaging  30 Days  Freestyle Libre Kit 2 Sensors- Use as directed every 14 days Lisinopril 10 mg 1 tablet daily (breakfast) Vitamin D3 50 mcg - 1 capsule daily (evening meal) Atorvastatin 20 mg 1 tablet daily (evening meal) Metformin 750 mg 1 tablet daily (breakfast) Gabapentin 100 mg at  breakfast, lunch and evening meal Famotidine 20 mg daily (Breakfast) Tramadol 50 mg 3 times daily PRN (Vials) Tresiba- 15 units at bedtime Ozempic 1 mg- Add to delivery. Patient stated upstream confirmed that insurance covers.  Patient declined the following medications this month: Flexeril- not taking one time fill Celebrex- Not taking  Refills requested from providers include: Famotidine Tramadol Ozempic 1 mg- Sent message to PCP to send prescription  Confirmed delivery date of 07-16-2022, advised patient that pharmacy will contact them the morning of delivery.   Any concerns about your medications? No  How often do you forget or accidentally miss a dose? Never  Do you use a pillbox? No  Is patient in packaging Yes  If yes  What is the date on your next pill pack? 07-05-2022  Any concerns or issues with your packaging? No   Recent blood pressure readings are as follows: 169/85 06-28-2022  Recent blood glucose readings are as follows: 7.7 05-29-2022  NOTES: Suggested patient to still fill out [id number] ozempic application because she may run into the donut hole sometime this year with insurance. Another application will be mailed out for patient to sign and return to [facility] with SSI income statement. Patient will still get ozempic with next upstream delivery.  Cycle dispensing form sent to [name] for review.   Malecca Ishmael Holter

## 2022-07-11 NOTE — Progress Notes (Signed)
Surgical Instructions    Your procedure is scheduled on Monday, 07/16/22.  Report to [facility] Main Entrance "A" at 9:00 A.M., then check in with the Admitting office.  Call this number if you have problems the morning of surgery:  [phone]   If you have any questions prior to your surgery date call [phone]: Open Monday-Friday 8am-4pm If you experience any cold or flu symptoms such as cough, fever, chills, shortness of breath, etc. between now and your scheduled surgery, please notify us at the above number     Remember:  Do not eat after midnight the night before your surgery  You may drink clear liquids until 8:00am the morning of your surgery.   Clear liquids allowed are: Water, Non-Citrus Juices (without pulp), Carbonated Beverages, Clear Tea, Black Coffee ONLY (NO MILK, CREAM OR POWDERED CREAMER of any kind), and Gatorade    Take these medicines the morning of surgery with A SIP OF WATER:  famotidine (PEPCID)  gabapentin (NEURONTIN)   IF NEEDED: cyclobenzaprine (FLEXERIL)  traMADol (ULTRAM)   As of today, STOP taking any Aspirin (unless otherwise instructed by your surgeon) Aleve, Naproxen, Ibuprofen, Motrin, Advil, Goody's, BC's, all herbal medications, fish oil, and all vitamins.  WHAT DO I DO ABOUT MY DIABETES MEDICATION?   Do not take oral diabetes medicines (pills) the morning of surgery.  Do not take Semaglutide (OZEMPIC) 7 days prior to surgery. Your last dose will be 07/08/22.  THE NIGHT BEFORE SURGERY, take 50% (7 units) of TRESIBA.     THE MORNING OF SURGERY, do not take metFORMIN (GLUCOPHAGE XR).  The day of surgery, do not take other diabetes injectables, including Byetta (exenatide), Bydureon (exenatide ER), Victoza (liraglutide), or Trulicity (dulaglutide).  If your CBG is greater than 220 mg/dL, you may take  of your sliding scale (correction) dose of insulin.   HOW TO MANAGE YOUR DIABETES BEFORE AND AFTER SURGERY  Why is it important to  control my blood sugar before and after surgery? Improving blood sugar levels before and after surgery helps healing and can limit problems. A way of improving blood sugar control is eating a healthy diet by:  Eating less sugar and carbohydrates  Increasing activity/exercise  Talking with your doctor about reaching your blood sugar goals High blood sugars (greater than 180 mg/dL) can raise your risk of infections and slow your recovery, so you will need to focus on controlling your diabetes during the weeks before surgery. Make sure that the doctor who takes care of your diabetes knows about your planned surgery including the date and location.  How do I manage my blood sugar before surgery? Check your blood sugar at least 4 times a day, starting 2 days before surgery, to make sure that the level is not too high or low.  Check your blood sugar the morning of your surgery when you wake up and every 2 hours until you get to the Short Stay unit.  If your blood sugar is less than 70 mg/dL, you will need to treat for low blood sugar: Do not take insulin. Treat a low blood sugar (less than 70 mg/dL) with  cup of clear juice (cranberry or apple), 4 glucose tablets, OR glucose gel. Recheck blood sugar in 15 minutes after treatment (to make sure it is greater than 70 mg/dL). If your blood sugar is not greater than 70 mg/dL on recheck, call [phone] for further instructions. Report your blood sugar to the short stay nurse when you get to Short  Stay.  If you are admitted to the hospital after surgery: Your blood sugar will be checked by the staff and you will probably be given insulin after surgery (instead of oral diabetes medicines) to make sure you have good blood sugar levels. The goal for blood sugar control after surgery is 80-180 mg/dL.            Do not wear jewelry or makeup. Do not wear lotions, powders, perfumes or deodorant. Do not shave 48 hours prior to surgery.   Do not bring  valuables to the hospital. Do not wear nail polish, gel polish, artificial nails, or any other type of covering on natural nails (fingers and toes) If you have artificial nails or gel coating that need to be removed by a nail salon, please have this removed prior to surgery. Artificial nails or gel coating may interfere with anesthesia's ability to adequately monitor your vital signs.  [facility] is not responsible for any belongings or valuables.    Do NOT Smoke (Tobacco/Vaping)  24 hours prior to your procedure  If you use a CPAP at night, you may bring your mask for your overnight stay.   Contacts, glasses, hearing aids, dentures or partials may not be worn into surgery, please bring cases for these belongings   For patients admitted to the hospital, discharge time will be determined by your treatment team.   Patients discharged the day of surgery will not be allowed to drive home, and someone needs to stay with them for 24 hours.   SURGICAL WAITING ROOM VISITATION Patients having surgery or a procedure may have no more than 2 support people in the waiting area - these visitors may rotate.   Children under the age of 70 must have an adult with them who is not the patient. If the patient needs to stay at the hospital during part of their recovery, the visitor guidelines for inpatient rooms apply. Pre-op nurse will coordinate an appropriate time for 1 support person to accompany patient in pre-op.  This support person may not rotate.   Please refer to [email or web address] for the visitor guidelines for Inpatients (after your surgery is over and you are in a regular room).    Special instructions:    Oral Hygiene is also important to reduce your risk of infection.  Remember - BRUSH YOUR TEETH THE MORNING OF SURGERY WITH YOUR REGULAR TOOTHPASTE   [facility]- Preparing For Surgery  Before surgery, you can play an important role.  Because skin is not sterile, your skin needs to be as free of germs as possible. You can reduce the number of germs on your skin by washing with CHG (chlorahexidine gluconate) Soap before surgery.  CHG is an antiseptic cleaner which kills germs and bonds with the skin to continue killing germs even after washing.     Please do not use if you have an allergy to CHG or antibacterial soaps. If your skin becomes reddened/irritated stop using the CHG.  Do not shave (including legs and underarms) for at least 48 hours prior to first CHG shower. It is OK to shave your face.  Please follow these instructions carefully.     Shower the NIGHT BEFORE SURGERY and the MORNING OF SURGERY with CHG Soap.   If you chose to wash your hair, wash your hair first as usual with your normal shampoo. After you shampoo, rinse your hair and body thoroughly to remove the shampoo.  Then [employer] and genitals (private parts)  with your normal soap and rinse thoroughly to remove soap.  After that Use CHG Soap as you would any other liquid soap. You can apply CHG directly to the skin and wash gently with a scrungie or a clean washcloth.   Apply the CHG Soap to your body ONLY FROM THE NECK DOWN.  Do not use on open wounds or open sores. Avoid contact with your eyes, ears, mouth and genitals (private parts). Wash Face and genitals (private parts)  with your normal soap.   Wash thoroughly, paying special attention to the area where your surgery will be performed.  Thoroughly rinse your body with warm water from the neck down.  DO NOT shower/wash with your normal soap after using and rinsing off the CHG Soap.  Pat yourself dry with a CLEAN TOWEL.  Wear CLEAN PAJAMAS to bed the night before surgery  Place CLEAN SHEETS on your bed the night before your surgery  DO NOT SLEEP WITH PETS.   Day of Surgery: Take a shower with CHG soap. Wear Clean/Comfortable clothing the morning of surgery Do not apply any  deodorants/lotions.   Remember to brush your teeth WITH YOUR REGULAR TOOTHPASTE.    If you received a COVID test during your pre-op visit, it is requested that you wear a mask when out in public, stay away from anyone that may not be feeling well, and notify your surgeon if you develop symptoms. If you have been in contact with anyone that has tested positive in the last 10 days, please notify your surgeon.    Please read over the following fact sheets that you were given.

## 2022-07-12 ENCOUNTER — Encounter (HOSPITAL_COMMUNITY): Payer: Self-pay

## 2022-07-12 ENCOUNTER — Other Ambulatory Visit: Payer: Self-pay

## 2022-07-12 ENCOUNTER — Telehealth: Payer: Self-pay

## 2022-07-12 ENCOUNTER — Encounter (HOSPITAL_COMMUNITY)
Admission: RE | Admit: 2022-07-12 | Discharge: 2022-07-12 | Disposition: A | Discharge status: Home / Self Care | Payer: PPO | Source: Ambulatory Visit | Attending: Orthopedic Surgery | Admitting: Orthopedic Surgery

## 2022-07-12 VITALS — BP 133/75 | HR 88 | Temp 97.6°F | Resp 17 | Ht 63.0 in | Wt 157.4 lb

## 2022-07-12 DIAGNOSIS — G4733 Obstructive sleep apnea (adult) (pediatric): Secondary | ICD-10-CM | POA: Diagnosis not present

## 2022-07-12 DIAGNOSIS — E119 Type 2 diabetes mellitus without complications: Secondary | ICD-10-CM | POA: Insufficient documentation

## 2022-07-12 DIAGNOSIS — H35071 Retinal telangiectasis, right eye: Secondary | ICD-10-CM

## 2022-07-12 DIAGNOSIS — Z01818 Encounter for other preprocedural examination: Secondary | ICD-10-CM

## 2022-07-12 DIAGNOSIS — Z794 Long term (current) use of insulin: Secondary | ICD-10-CM | POA: Diagnosis not present

## 2022-07-12 DIAGNOSIS — Z01812 Encounter for preprocedural laboratory examination: Secondary | ICD-10-CM | POA: Insufficient documentation

## 2022-07-12 LAB — CBC
HCT: 37.4 % (ref 36.0–46.0)
Hemoglobin: 11.8 g/dL — ABNORMAL LOW (ref 12.0–15.0)
MCH: 28.1 pg (ref 26.0–34.0)
MCHC: 31.6 g/dL (ref 30.0–36.0)
MCV: 89 fL (ref 80.0–100.0)
Platelets: 244 10*3/uL (ref 150–400)
RBC: 4.2 MIL/uL (ref 3.87–5.11)
RDW: 13.1 % (ref 11.5–15.5)
WBC: 6.1 10*3/uL (ref 4.0–10.5)
nRBC: 0 % (ref 0.0–0.2)

## 2022-07-12 LAB — BASIC METABOLIC PANEL
Anion gap: 9 (ref 5–15)
BUN: 15 mg/dL (ref 8–23)
CO2: 27 mmol/L (ref 22–32)
Calcium: 9.3 mg/dL (ref 8.9–10.3)
Chloride: 105 mmol/L (ref 98–111)
Creatinine, Ser: 0.66 mg/dL (ref 0.44–1.00)
GFR, Estimated: >60 mL/min (ref >60)
Glucose, Bld: 170 mg/dL — ABNORMAL HIGH (ref 70–99)
Potassium: 4.2 mmol/L (ref 3.5–5.1)
Sodium: 141 mmol/L (ref 135–145)

## 2022-07-12 LAB — GLUCOSE, CAPILLARY: Glucose-Capillary: 157 mg/dL — ABNORMAL HIGH (ref 70–99)

## 2022-07-12 LAB — TYPE AND SCREEN
ABO/RH(D): O POS
Antibody Screen: NEGATIVE

## 2022-07-12 LAB — SURGICAL PCR SCREEN
MRSA, PCR: NEGATIVE
Staphylococcus aureus: NEGATIVE

## 2022-07-12 NOTE — Progress Notes (Signed)
PCP - [name] FNP Cardiologist - Denies  PPM/ICD - Denies  Chest x-ray - N/A EKG - 01/23/22 Stress Test - Denies ECHO - 09/20/08 Cardiac Cath - 09/20/08  Sleep Study - OSA CPAP - Yes  Fasting Blood Sugar - 120-135 Checks Blood Sugar __1___ time a day CGM: Left arm  Last dose of GLP1 agonist-  07/06/22 GLP1 instructions: Stop 7 days prior to surgery. LD should be 07/08/22  Blood Thinner Instructions: N/A Aspirin Instructions: N/A  ERAS Protcol - Yes PRE-SURGERY Ensure or G2- No  COVID TEST- N/A   Anesthesia review: Yes, previous cardiac workup hx  Patient denies shortness of breath, fever, cough and chest pain at PAT appointment   All instructions explained to the patient, with a verbal understanding of the material. Patient agrees to go over the instructions while at home for a better understanding. Patient also instructed to self quarantine after being tested for COVID-19. The opportunity to ask questions was provided.

## 2022-07-12 NOTE — Telephone Encounter (Signed)
[place] APPROVED FOR OZEMPIC UNTIL 07/13/23

## 2022-07-13 DIAGNOSIS — M5416 Radiculopathy, lumbar region: Secondary | ICD-10-CM | POA: Diagnosis not present

## 2022-07-15 NOTE — Anesthesia Preprocedure Evaluation (Addendum)
Anesthesia Evaluation  Patient identified by MRN, date of birth, ID band Patient awake    Reviewed: Allergy & Precautions, NPO status , Patient's Chart, lab work & pertinent test results  Airway Mallampati: II  TM Distance: >3 FB Neck ROM: Full    Dental no notable dental hx. (+) Teeth Intact, Dental Advisory Given   Pulmonary sleep apnea and Continuous Positive Airway Pressure Ventilation , former smoker   Pulmonary exam normal breath sounds clear to auscultation       Cardiovascular Normal cardiovascular exam Rhythm:Regular Rate:Normal     Neuro/Psych    GI/Hepatic negative GI ROS,,,  Endo/Other  diabetes, Well Controlled, Type 2, Oral Hypoglycemic Agents    Renal/GU Lab Results      Component                Value               Date                      CREATININE               0.66                07/12/2022                        K                        4.2                 07/12/2022                   Musculoskeletal  (+) Arthritis ,    Abdominal   Peds  Hematology Lab Results      Component                Value               Date                      WBC                      6.1                 07/12/2022                HGB                      11.8 (L)            07/12/2022                HCT                      37.4                07/12/2022                MCV                      89.0                07/12/2022                PLT  244                 07/12/2022              Anesthesia Other Findings   Reproductive/Obstetrics                             Anesthesia Physical Anesthesia Plan  ASA: 3  Anesthesia Plan: General   Post-op Pain Management: Ketamine IV*, Dilaudid IV and Ofirmev IV (intra-op)*   Induction: Intravenous  PONV Risk Score and Plan: 4 or greater and Treatment may vary due to age or medical condition, Ondansetron, Midazolam and  Dexamethasone  Airway Management Planned: Oral ETT  Additional Equipment: None  Intra-op Plan:   Post-operative Plan: Extubation in OR  Informed Consent: I have reviewed the patients History and Physical, chart, labs and discussed the procedure including the risks, benefits and alternatives for the proposed anesthesia with the patient or authorized representative who has indicated his/her understanding and acceptance.     Dental advisory given  Plan Discussed with: CRNA, Anesthesiologist and Surgeon  Anesthesia Plan Comments: (Pt on semiglutide q week last time 1/12)        Anesthesia Quick Evaluation

## 2022-07-16 ENCOUNTER — Other Ambulatory Visit: Payer: Self-pay

## 2022-07-16 ENCOUNTER — Inpatient Hospital Stay (HOSPITAL_COMMUNITY): Payer: PPO

## 2022-07-16 ENCOUNTER — Inpatient Hospital Stay (HOSPITAL_COMMUNITY)
Admission: RE | Admit: 2022-07-16 | Discharge: 2022-07-18 | DRG: 455 | Disposition: A | Discharge status: Home Health | Payer: PPO | Attending: Orthopedic Surgery | Admitting: Orthopedic Surgery

## 2022-07-16 ENCOUNTER — Encounter (HOSPITAL_COMMUNITY)
Admission: RE | Disposition: A | Discharge status: Home Health | Payer: Self-pay | Source: Home / Self Care | Attending: Orthopedic Surgery

## 2022-07-16 ENCOUNTER — Inpatient Hospital Stay (HOSPITAL_COMMUNITY): Payer: PPO | Admitting: Anesthesiology

## 2022-07-16 ENCOUNTER — Encounter (HOSPITAL_COMMUNITY): Payer: Self-pay | Admitting: Orthopedic Surgery

## 2022-07-16 DIAGNOSIS — Z7984 Long term (current) use of oral hypoglycemic drugs: Secondary | ICD-10-CM

## 2022-07-16 DIAGNOSIS — E119 Type 2 diabetes mellitus without complications: Secondary | ICD-10-CM

## 2022-07-16 DIAGNOSIS — G4730 Sleep apnea, unspecified: Secondary | ICD-10-CM | POA: Diagnosis present

## 2022-07-16 DIAGNOSIS — M5116 Intervertebral disc disorders with radiculopathy, lumbar region: Secondary | ICD-10-CM | POA: Diagnosis not present

## 2022-07-16 DIAGNOSIS — Z87891 Personal history of nicotine dependence: Secondary | ICD-10-CM | POA: Diagnosis not present

## 2022-07-16 DIAGNOSIS — Z79899 Other long term (current) drug therapy: Secondary | ICD-10-CM | POA: Diagnosis not present

## 2022-07-16 DIAGNOSIS — Z8616 Personal history of COVID-19: Secondary | ICD-10-CM | POA: Diagnosis not present

## 2022-07-16 DIAGNOSIS — M48062 Spinal stenosis, lumbar region with neurogenic claudication: Secondary | ICD-10-CM | POA: Diagnosis not present

## 2022-07-16 DIAGNOSIS — E7800 Pure hypercholesterolemia, unspecified: Secondary | ICD-10-CM | POA: Diagnosis present

## 2022-07-16 DIAGNOSIS — M48061 Spinal stenosis, lumbar region without neurogenic claudication: Secondary | ICD-10-CM

## 2022-07-16 DIAGNOSIS — M5416 Radiculopathy, lumbar region: Secondary | ICD-10-CM | POA: Diagnosis not present

## 2022-07-16 DIAGNOSIS — Z981 Arthrodesis status: Principal | ICD-10-CM

## 2022-07-16 HISTORY — PX: TRANSFORAMINAL LUMBAR INTERBODY FUSION (TLIF) WITH PEDICLE SCREW FIXATION 1 LEVEL: SHX6141

## 2022-07-16 LAB — GLUCOSE, CAPILLARY
Glucose-Capillary: 154 mg/dL — ABNORMAL HIGH (ref 70–99)
Glucose-Capillary: 104 mg/dL — ABNORMAL HIGH (ref 70–99)
Glucose-Capillary: 93 mg/dL (ref 70–99)
Glucose-Capillary: 133 mg/dL — ABNORMAL HIGH (ref 70–99)

## 2022-07-16 SURGERY — TRANSFORAMINAL LUMBAR INTERBODY FUSION (TLIF) WITH PEDICLE SCREW FIXATION 1 LEVEL
Anesthesia: General | Site: Spine Lumbar

## 2022-07-16 MED ORDER — EPINEPHRINE PF 1 MG/ML IJ SOLN
INTRAMUSCULAR | Status: DC | PRN
  Administered 2022-07-16: .15 mL

## 2022-07-16 MED ORDER — METHOCARBAMOL 500 MG PO TABS
500.0000 mg | ORAL_TABLET | Freq: Four times a day (QID) | ORAL | Status: DC | PRN
  Administered 2022-07-16 – 2022-07-17 (×2): 500 mg via ORAL
  Filled 2022-07-16 (×2): qty 1

## 2022-07-16 MED ORDER — LIDOCAINE 2% (20 MG/ML) 5 ML SYRINGE
INTRAMUSCULAR | Status: DC | PRN
  Administered 2022-07-16: 100 mg via INTRAVENOUS

## 2022-07-16 MED ORDER — FAMOTIDINE 20 MG PO TABS
20.0000 mg | ORAL_TABLET | Freq: Every morning | ORAL | Status: DC
  Administered 2022-07-17 – 2022-07-18 (×2): 20 mg via ORAL
  Filled 2022-07-16 (×2): qty 1

## 2022-07-16 MED ORDER — OXYCODONE-ACETAMINOPHEN 10-325 MG PO TABS: 1.0000 | ORAL_TABLET | Freq: Four times a day (QID) | ORAL | 0 refills | Status: AC | PRN

## 2022-07-16 MED ORDER — HYDROMORPHONE HCL 1 MG/ML IJ SOLN: 1.0000 mg | INTRAMUSCULAR | Status: AC | PRN

## 2022-07-16 MED ORDER — INSULIN ASPART 100 UNIT/ML IJ SOLN
0.0000 [IU] | Freq: Three times a day (TID) | INTRAMUSCULAR | Status: DC
  Administered 2022-07-17: 5 [IU] via SUBCUTANEOUS
  Administered 2022-07-17: 3 [IU] via SUBCUTANEOUS
  Administered 2022-07-17: 2 [IU] via SUBCUTANEOUS
  Administered 2022-07-18: 3 [IU] via SUBCUTANEOUS
  Administered 2022-07-18: 5 [IU] via SUBCUTANEOUS

## 2022-07-16 MED ORDER — PHENOL 1.4 % MT LIQD: 1.0000 | OROMUCOSAL | Status: DC | PRN

## 2022-07-16 MED ORDER — CHLORHEXIDINE GLUCONATE 0.12 % MT SOLN
OROMUCOSAL | Status: AC
  Filled 2022-07-16: qty 15

## 2022-07-16 MED ORDER — TRANEXAMIC ACID-NACL 1000-0.7 MG/100ML-% IV SOLN
1000.0000 mg | INTRAVENOUS | Status: AC
  Administered 2022-07-16: 1000 mg via INTRAVENOUS

## 2022-07-16 MED ORDER — EPINEPHRINE PF 1 MG/ML IJ SOLN
INTRAMUSCULAR | Status: AC
  Filled 2022-07-16: qty 1

## 2022-07-16 MED ORDER — ACETAMINOPHEN 10 MG/ML IV SOLN
INTRAVENOUS | Status: DC | PRN
  Administered 2022-07-16: 1000 mg via INTRAVENOUS

## 2022-07-16 MED ORDER — MAGNESIUM CITRATE PO SOLN
1.0000 | Freq: Once | ORAL | Status: AC
  Administered 2022-07-16: 1 via ORAL
  Filled 2022-07-16: qty 296

## 2022-07-16 MED ORDER — LACTATED RINGERS IV SOLN: INTRAVENOUS | Status: DC

## 2022-07-16 MED ORDER — PROPOFOL 10 MG/ML IV BOLUS
INTRAVENOUS | Status: DC | PRN
  Administered 2022-07-16: 60 mg via INTRAVENOUS
  Administered 2022-07-16: 75 mg via INTRAVENOUS
  Administered 2022-07-16: 50 mg via INTRAVENOUS
  Administered 2022-07-16: 110 mg via INTRAVENOUS
  Administered 2022-07-16: 50 mg via INTRAVENOUS

## 2022-07-16 MED ORDER — HYDROMORPHONE HCL 1 MG/ML IJ SOLN
INTRAMUSCULAR | Status: AC
  Administered 2022-07-16: 0.5 mg via INTRAVENOUS
  Filled 2022-07-16: qty 1

## 2022-07-16 MED ORDER — THROMBIN 20000 UNITS EX SOLR
CUTANEOUS | Status: DC | PRN
  Administered 2022-07-16: 20 mL via TOPICAL

## 2022-07-16 MED ORDER — KETAMINE HCL 50 MG/5ML IJ SOSY
PREFILLED_SYRINGE | INTRAMUSCULAR | Status: AC
  Filled 2022-07-16: qty 5

## 2022-07-16 MED ORDER — OXYCODONE HCL 5 MG PO TABS
5.0000 mg | ORAL_TABLET | ORAL | Status: DC | PRN
  Administered 2022-07-16 (×2): 5 mg via ORAL
  Filled 2022-07-16 (×2): qty 1

## 2022-07-16 MED ORDER — PROPOFOL 10 MG/ML IV BOLUS
INTRAVENOUS | Status: AC
  Filled 2022-07-16: qty 20

## 2022-07-16 MED ORDER — AMISULPRIDE (ANTIEMETIC) 5 MG/2ML IV SOLN: 10.0000 mg | Freq: Once | INTRAVENOUS | Status: DC | PRN

## 2022-07-16 MED ORDER — MIDAZOLAM HCL 2 MG/2ML IJ SOLN
INTRAMUSCULAR | Status: DC | PRN
  Administered 2022-07-16 (×2): 1 mg via INTRAVENOUS

## 2022-07-16 MED ORDER — ACETAMINOPHEN 650 MG RE SUPP: 650.0000 mg | RECTAL | Status: DC | PRN

## 2022-07-16 MED ORDER — ONDANSETRON HCL 4 MG/2ML IJ SOLN
INTRAMUSCULAR | Status: AC
  Filled 2022-07-16: qty 2

## 2022-07-16 MED ORDER — SODIUM CHLORIDE 0.9% FLUSH: 3.0000 mL | Freq: Two times a day (BID) | INTRAVENOUS | Status: DC

## 2022-07-16 MED ORDER — 0.9 % SODIUM CHLORIDE (POUR BTL) OPTIME
TOPICAL | Status: DC | PRN
  Administered 2022-07-16 (×2): 1000 mL

## 2022-07-16 MED ORDER — ONDANSETRON HCL 4 MG PO TABS: 4.0000 mg | ORAL_TABLET | Freq: Four times a day (QID) | ORAL | Status: DC | PRN

## 2022-07-16 MED ORDER — CEFAZOLIN SODIUM-DEXTROSE 1-4 GM/50ML-% IV SOLN
1.0000 g | Freq: Three times a day (TID) | INTRAVENOUS | Status: AC
  Administered 2022-07-16 – 2022-07-17 (×2): 1 g via INTRAVENOUS
  Filled 2022-07-16 (×2): qty 50

## 2022-07-16 MED ORDER — ALBUMIN HUMAN 5 % IV SOLN: INTRAVENOUS | Status: DC | PRN

## 2022-07-16 MED ORDER — ACETAMINOPHEN 10 MG/ML IV SOLN
INTRAVENOUS | Status: AC
  Filled 2022-07-16: qty 100

## 2022-07-16 MED ORDER — TRANEXAMIC ACID-NACL 1000-0.7 MG/100ML-% IV SOLN
INTRAVENOUS | Status: AC
  Filled 2022-07-16: qty 100

## 2022-07-16 MED ORDER — METHOCARBAMOL 500 MG PO TABS: 500.0000 mg | ORAL_TABLET | Freq: Three times a day (TID) | ORAL | 0 refills | Status: AC | PRN

## 2022-07-16 MED ORDER — METHOCARBAMOL 1000 MG/10ML IJ SOLN: 500.0000 mg | Freq: Four times a day (QID) | INTRAVENOUS | Status: DC | PRN

## 2022-07-16 MED ORDER — FENTANYL CITRATE (PF) 250 MCG/5ML IJ SOLN
INTRAMUSCULAR | Status: AC
  Filled 2022-07-16: qty 5

## 2022-07-16 MED ORDER — BUPIVACAINE HCL 0.25 % IJ SOLN
INTRAMUSCULAR | Status: DC | PRN
  Administered 2022-07-16: 10 mL

## 2022-07-16 MED ORDER — SODIUM CHLORIDE 0.9% FLUSH: 3.0000 mL | INTRAVENOUS | Status: DC | PRN

## 2022-07-16 MED ORDER — ONDANSETRON HCL 4 MG PO TABS: 4.0000 mg | ORAL_TABLET | Freq: Three times a day (TID) | ORAL | 0 refills | Status: DC | PRN

## 2022-07-16 MED ORDER — BUPIVACAINE HCL (PF) 0.25 % IJ SOLN
INTRAMUSCULAR | Status: AC
  Filled 2022-07-16: qty 30

## 2022-07-16 MED ORDER — METFORMIN HCL ER 750 MG PO TB24
750.0000 mg | ORAL_TABLET | Freq: Every day | ORAL | Status: DC
  Administered 2022-07-17 – 2022-07-18 (×2): 750 mg via ORAL
  Filled 2022-07-16 (×3): qty 1

## 2022-07-16 MED ORDER — SENNOSIDES-DOCUSATE SODIUM 8.6-50 MG PO TABS
1.0000 | ORAL_TABLET | Freq: Two times a day (BID) | ORAL | Status: DC
  Administered 2022-07-16 – 2022-07-18 (×4): 1 via ORAL
  Filled 2022-07-16 (×4): qty 1

## 2022-07-16 MED ORDER — OXYCODONE HCL 5 MG/5ML PO SOLN: 5.0000 mg | Freq: Once | ORAL | Status: DC | PRN

## 2022-07-16 MED ORDER — THROMBIN 20000 UNITS EX SOLR
CUTANEOUS | Status: AC
  Filled 2022-07-16: qty 20000

## 2022-07-16 MED ORDER — LIDOCAINE 2% (20 MG/ML) 5 ML SYRINGE
INTRAMUSCULAR | Status: AC
  Filled 2022-07-16: qty 5

## 2022-07-16 MED ORDER — LIDOCAINE 2% (20 MG/ML) 5 ML SYRINGE: INTRAMUSCULAR | Status: DC | PRN

## 2022-07-16 MED ORDER — PHENYLEPHRINE HCL-NACL 20-0.9 MG/250ML-% IV SOLN
INTRAVENOUS | Status: DC | PRN
  Administered 2022-07-16: 15 ug/min via INTRAVENOUS

## 2022-07-16 MED ORDER — LISINOPRIL 10 MG PO TABS
10.0000 mg | ORAL_TABLET | Freq: Every morning | ORAL | Status: DC
  Administered 2022-07-17 – 2022-07-18 (×2): 10 mg via ORAL
  Filled 2022-07-16 (×2): qty 1

## 2022-07-16 MED ORDER — FENTANYL CITRATE (PF) 250 MCG/5ML IJ SOLN
INTRAMUSCULAR | Status: DC | PRN
  Administered 2022-07-16: 75 ug via INTRAVENOUS
  Administered 2022-07-16 (×2): 50 ug via INTRAVENOUS
  Administered 2022-07-16: 75 ug via INTRAVENOUS

## 2022-07-16 MED ORDER — CEFAZOLIN SODIUM-DEXTROSE 2-4 GM/100ML-% IV SOLN
INTRAVENOUS | Status: AC
  Filled 2022-07-16: qty 100

## 2022-07-16 MED ORDER — ONDANSETRON HCL 4 MG/2ML IJ SOLN: 4.0000 mg | Freq: Four times a day (QID) | INTRAMUSCULAR | Status: DC | PRN

## 2022-07-16 MED ORDER — PHENYLEPHRINE 80 MCG/ML (10ML) SYRINGE FOR IV PUSH (FOR BLOOD PRESSURE SUPPORT)
PREFILLED_SYRINGE | INTRAVENOUS | Status: AC
  Filled 2022-07-16: qty 10

## 2022-07-16 MED ORDER — INSULIN ASPART 100 UNIT/ML IJ SOLN: 0.0000 [IU] | Freq: Every day | INTRAMUSCULAR | Status: DC

## 2022-07-16 MED ORDER — POLYETHYLENE GLYCOL 3350 17 G PO PACK: 17.0000 g | PACK | Freq: Every day | ORAL | Status: DC | PRN

## 2022-07-16 MED ORDER — ACETAMINOPHEN 10 MG/ML IV SOLN: 1000.0000 mg | Freq: Once | INTRAVENOUS | Status: DC | PRN

## 2022-07-16 MED ORDER — SUCCINYLCHOLINE CHLORIDE 200 MG/10ML IV SOSY
PREFILLED_SYRINGE | INTRAVENOUS | Status: DC | PRN
  Administered 2022-07-16: 80 mg via INTRAVENOUS

## 2022-07-16 MED ORDER — CHLORHEXIDINE GLUCONATE 0.12 % MT SOLN
15.0000 mL | Freq: Once | OROMUCOSAL | Status: AC
  Administered 2022-07-16: 15 mL via OROMUCOSAL

## 2022-07-16 MED ORDER — DEXAMETHASONE SODIUM PHOSPHATE 10 MG/ML IJ SOLN
INTRAMUSCULAR | Status: AC
  Filled 2022-07-16: qty 1

## 2022-07-16 MED ORDER — OXYCODONE HCL 5 MG PO TABS
10.0000 mg | ORAL_TABLET | ORAL | Status: DC | PRN
  Administered 2022-07-17 – 2022-07-18 (×8): 10 mg via ORAL
  Filled 2022-07-16 (×8): qty 2

## 2022-07-16 MED ORDER — SODIUM CHLORIDE 0.9 % IV SOLN: 250.0000 mL | INTRAVENOUS | Status: DC

## 2022-07-16 MED ORDER — ONDANSETRON HCL 4 MG/2ML IJ SOLN: 4.0000 mg | Freq: Once | INTRAMUSCULAR | Status: DC | PRN

## 2022-07-16 MED ORDER — CEFAZOLIN SODIUM-DEXTROSE 2-4 GM/100ML-% IV SOLN
2.0000 g | INTRAVENOUS | Status: AC
  Administered 2022-07-16: 2 g via INTRAVENOUS

## 2022-07-16 MED ORDER — ACETAMINOPHEN 325 MG PO TABS
650.0000 mg | ORAL_TABLET | ORAL | Status: DC | PRN
  Administered 2022-07-17: 650 mg via ORAL
  Filled 2022-07-16: qty 2

## 2022-07-16 MED ORDER — SURGIFLO WITH THROMBIN (HEMOSTATIC MATRIX KIT) OPTIME
TOPICAL | Status: DC | PRN
  Administered 2022-07-16 (×2): 1 via TOPICAL

## 2022-07-16 MED ORDER — MAGNESIUM CITRATE PO SOLN: 1.0000 | Freq: Once | ORAL | Status: DC | PRN

## 2022-07-16 MED ORDER — PROPOFOL 500 MG/50ML IV EMUL
INTRAVENOUS | Status: DC | PRN
  Administered 2022-07-16: 25 ug/kg/min via INTRAVENOUS

## 2022-07-16 MED ORDER — KETAMINE HCL 10 MG/ML IJ SOLN
INTRAMUSCULAR | Status: DC | PRN
  Administered 2022-07-16 (×4): 10 mg via INTRAVENOUS

## 2022-07-16 MED ORDER — HYDROMORPHONE HCL 1 MG/ML IJ SOLN
0.2500 mg | INTRAMUSCULAR | Status: DC | PRN
  Administered 2022-07-16: 0.5 mg via INTRAVENOUS

## 2022-07-16 MED ORDER — MENTHOL 3 MG MT LOZG: 1.0000 | LOZENGE | OROMUCOSAL | Status: DC | PRN

## 2022-07-16 MED ORDER — OXYCODONE HCL 5 MG PO TABS: 5.0000 mg | ORAL_TABLET | Freq: Once | ORAL | Status: DC | PRN

## 2022-07-16 MED ORDER — STERILE WATER FOR IRRIGATION IR SOLN
Status: DC | PRN
  Administered 2022-07-16: 1000 mL

## 2022-07-16 MED ORDER — SUCCINYLCHOLINE CHLORIDE 200 MG/10ML IV SOSY
PREFILLED_SYRINGE | INTRAVENOUS | Status: AC
  Filled 2022-07-16: qty 10

## 2022-07-16 MED ORDER — MIDAZOLAM HCL 2 MG/2ML IJ SOLN
INTRAMUSCULAR | Status: AC
  Filled 2022-07-16: qty 2

## 2022-07-16 MED ORDER — ORAL CARE MOUTH RINSE: 15.0000 mL | Freq: Once | OROMUCOSAL | Status: AC

## 2022-07-16 MED ORDER — ONDANSETRON HCL 4 MG/2ML IJ SOLN
INTRAMUSCULAR | Status: DC | PRN
  Administered 2022-07-16: 4 mg via INTRAVENOUS

## 2022-07-16 SURGICAL SUPPLY — 79 items
AGENT HMST KT MTR STRL THRMB (HEMOSTASIS) ×2
BAG COUNTER SPONGE SURGICOUNT (BAG) ×1 IMPLANT
BAG SPNG CNTER NS LX DISP (BAG) ×1
BLADE BONE MILL FINE (MISCELLANEOUS) ×1
BLADE CLIPPER SURG (BLADE) IMPLANT
BLADE MILL BN FN STRL DISP (MISCELLANEOUS) IMPLANT
BUR EGG ELITE 4.0 (BURR) ×1 IMPLANT
BUR MATCHSTICK NEURO 3.0 LAGG (BURR) ×1 IMPLANT
CAGE MOD EX PL 7X9X24 17D (Cage) IMPLANT
CANISTER SUCT 3000ML PPV (MISCELLANEOUS) ×1 IMPLANT
CAP RELINE MOD TULIP RMM (Cap) IMPLANT
CLIP NEUROVISION LG (CLIP) IMPLANT
CLSR STERI-STRIP ANTIMIC 1/2X4 (GAUZE/BANDAGES/DRESSINGS) ×1 IMPLANT
COVER SURGICAL LIGHT HANDLE (MISCELLANEOUS) ×1 IMPLANT
DRAIN CHANNEL 15F RND FF W/TCR (WOUND CARE) IMPLANT
DRAPE C-ARM 42X72 X-RAY (DRAPES) ×1 IMPLANT
DRAPE C-ARMOR (DRAPES) ×1 IMPLANT
DRAPE POUCH INSTRU U-SHP 10X18 (DRAPES) IMPLANT
DRAPE SURG 17X23 STRL (DRAPES) ×1 IMPLANT
DRAPE U-SHAPE 47X51 STRL (DRAPES) ×1 IMPLANT
DRSG OPSITE POSTOP 4X6 (GAUZE/BANDAGES/DRESSINGS) IMPLANT
DRSG OPSITE POSTOP 4X8 (GAUZE/BANDAGES/DRESSINGS) ×1 IMPLANT
DURAPREP 26ML APPLICATOR (WOUND CARE) ×1 IMPLANT
ELECT BLADE 4.0 EZ CLEAN MEGAD (MISCELLANEOUS) ×1
ELECT BLADE 6.5 EXT (BLADE) ×1 IMPLANT
ELECT PENCIL ROCKER SW 15FT (MISCELLANEOUS) ×1 IMPLANT
ELECT REM PT RETURN 9FT ADLT (ELECTROSURGICAL) ×1
ELECTRODE BLDE 4.0 EZ CLN MEGD (MISCELLANEOUS) IMPLANT
ELECTRODE REM PT RTRN 9FT ADLT (ELECTROSURGICAL) ×1 IMPLANT
GLOVE BIOGEL PI IND STRL 8.5 (GLOVE) ×1 IMPLANT
GLOVE SS BIOGEL STRL SZ 8.5 (GLOVE) ×1 IMPLANT
GOWN STRL REUS W/TWL 2XL LVL3 (GOWN DISPOSABLE) ×1 IMPLANT
KIT BASIN OR (CUSTOM PROCEDURE TRAY) ×1 IMPLANT
KIT POSITION SURG JACKSON T1 (MISCELLANEOUS) IMPLANT
KIT TURNOVER KIT B (KITS) ×1 IMPLANT
MODULE NVM5 NEXT GEN EMG (NEEDLE) IMPLANT
NDL 18GX1X1/2 (RX/OR ONLY) (NEEDLE) IMPLANT
NDL 22X1.5 STRL (OR ONLY) (MISCELLANEOUS) IMPLANT
NDL FILTER BLUNT 18X1 1/2 (NEEDLE) IMPLANT
NDL SPNL 18GX3.5 QUINCKE PK (NEEDLE) ×1 IMPLANT
NEEDLE 18GX1X1/2 (RX/OR ONLY) (NEEDLE) ×1 IMPLANT
NEEDLE 22X1.5 STRL (OR ONLY) (MISCELLANEOUS) IMPLANT
NEEDLE FILTER BLUNT 18X1 1/2 (NEEDLE) ×1 IMPLANT
NEEDLE SPNL 18GX3.5 QUINCKE PK (NEEDLE) ×1 IMPLANT
NS IRRIG 1000ML POUR BTL (IV SOLUTION) ×1 IMPLANT
PACK LAMINECTOMY ORTHO (CUSTOM PROCEDURE TRAY) ×1 IMPLANT
PACK UNIVERSAL I (CUSTOM PROCEDURE TRAY) ×1 IMPLANT
PAD ARMBOARD 7.5X6 YLW CONV (MISCELLANEOUS) ×2 IMPLANT
PATTIES SURGICAL .5 X.5 (GAUZE/BANDAGES/DRESSINGS) IMPLANT
PATTIES SURGICAL .5 X1 (DISPOSABLE) ×1 IMPLANT
POSITIONER HEAD PRONE TRACH (MISCELLANEOUS) ×1 IMPLANT
PROBE BALL TIP NVM5 SNG USE (BALLOONS) IMPLANT
PUTTY DBM INSTAFILL CART 5CC (Putty) IMPLANT
ROD RELINE COCR LORD 5X40MM (Rod) IMPLANT
SCREW LOCK RSS 4.5/5.0MM (Screw) IMPLANT
SHANK RELINE MOD 5.5X40 (Screw) IMPLANT
SPONGE SURGIFOAM ABS GEL 100 (HEMOSTASIS) ×1 IMPLANT
SPONGE T-LAP 4X18 ~~LOC~~+RFID (SPONGE) ×3 IMPLANT
SURGIFLO W/THROMBIN 8M KIT (HEMOSTASIS) ×1 IMPLANT
SUT BONE WAX W31G (SUTURE) ×1 IMPLANT
SUT MNCRL AB 3-0 PS2 27 (SUTURE) ×2 IMPLANT
SUT MNCRL+ AB 3-0 CT1 36 (SUTURE) ×1 IMPLANT
SUT MONOCRYL AB 3-0 CT1 36IN (SUTURE) ×1
SUT STRATAFIX 1PDS 45CM VIOLET (SUTURE) IMPLANT
SUT VIC AB 0 CT1 27 (SUTURE) ×1
SUT VIC AB 0 CT1 27XBRD ANBCTR (SUTURE) IMPLANT
SUT VIC AB 0 CT1 27XBRD ANTBC (SUTURE) ×1 IMPLANT
SUT VIC AB 1 CT1 18XCR BRD 8 (SUTURE) ×1 IMPLANT
SUT VIC AB 1 CT1 8-18 (SUTURE)
SUT VIC AB 2-0 CT1 18 (SUTURE) ×1 IMPLANT
SYR BULB IRRIG 60ML STRL (SYRINGE) ×1 IMPLANT
SYR CONTROL 10ML LL (SYRINGE) ×1 IMPLANT
SYR TB 1ML LUER SLIP (SYRINGE) IMPLANT
TIP CONICAL INSTAFILL (ORTHOPEDIC DISPOSABLE SUPPLIES) IMPLANT
TOWEL GREEN STERILE (TOWEL DISPOSABLE) ×1 IMPLANT
TOWEL GREEN STERILE FF (TOWEL DISPOSABLE) ×1 IMPLANT
TRAY FOLEY MTR SLVR 16FR STAT (SET/KITS/TRAYS/PACK) ×1 IMPLANT
WATER STERILE IRR 1000ML POUR (IV SOLUTION) ×1 IMPLANT
YANKAUER SUCT BULB TIP NO VENT (SUCTIONS) ×1 IMPLANT

## 2022-07-16 NOTE — Transfer of Care (Signed)
Immediate Anesthesia Transfer of Care Note  Patient: Shannon [name]  Procedure(s) Performed: TRANSFORAMINAL LUMBAR INTERBODY FUSION (TLIF L4-5) (Spine Lumbar)  Patient Location: PACU  Anesthesia Type:General  Level of Consciousness: awake, alert , oriented, and drowsy  Airway & Oxygen Therapy: Patient Spontanous Breathing  Post-op Assessment: Report given to RN and Post -op Vital signs reviewed and stable  Post vital signs: Reviewed and stable  Last Vitals:  Vitals Value Taken Time  BP 135/67 07/16/22 1518  Temp    Pulse 83 07/16/22 1520  Resp 24 07/16/22 1520  SpO2 98 % 07/16/22 1520  Vitals shown include unvalidated device data.  Last Pain:  Vitals:   07/16/22 0910  TempSrc: Oral  PainSc: 3          Complications: No notable events documented.

## 2022-07-16 NOTE — Brief Op Note (Signed)
07/16/2022  3:26 PM  PATIENT:  Shannon [name]  67 y.o. female  PRE-OPERATIVE DIAGNOSIS:  Degenerative disc disease with lumbar stenosis and radiculopathy  POST-OPERATIVE DIAGNOSIS:  Degenerative disc disease with lumbar stenosis and radiculopathy  PROCEDURE:  Procedure(s): TRANSFORAMINAL LUMBAR INTERBODY FUSION (TLIF L4-5) (N/A)  SURGEON:  Surgeon(s) and Role:    Melina Schools, MD - Primary  PHYSICIAN ASSISTANT:   ASSISTANTS: none   ANESTHESIA:   general  EBL:  75 mL   BLOOD ADMINISTERED:none  DRAINS: none   LOCAL MEDICATIONS USED:  MARCAINE     SPECIMEN:  No Specimen  DISPOSITION OF SPECIMEN:  N/A  COUNTS:  YES  TOURNIQUET:  * No tourniquets in log *  DICTATION: .Dragon Dictation  PLAN OF CARE: Admit for overnight observation  PATIENT DISPOSITION:  PACU - hemodynamically stable.

## 2022-07-16 NOTE — Discharge Instructions (Signed)

## 2022-07-16 NOTE — Op Note (Signed)
OPERATIVE REPORT  DATE OF SURGERY: 07/16/2022  PATIENT NAME:  Shannon [name] MRN: [id number] DOB: [birth year]  PCP: [name], FNP  PRE-OPERATIVE DIAGNOSIS: Degenerative lumbar disc disease L4-5 with central and foraminal stenosis.  Lumbar radiculopathy laterally left of the right.  POST-OPERATIVE DIAGNOSIS: Same  PROCEDURE:   TLIF L4-5  SURGEON:  Melina Schools, MD  PHYSICIAN ASSISTANT: None  ANESTHESIA:   General  EBL: 75 ml   Complications: None  Implants: NuVasive expandable TLIF cage.  7 x 9 x 24.  Final anterior height was 10 mm.  Posterior height was 9 mm.  NuVasive cortical pedicle screws.  5.5 x 40 mm length x 4.    Graft: Autograft from decompression supplemented with DBX  Neuro Monitoring: 4 pedicle screws were directly stimulated.  Right side: Both screws had no activity greater than 40 mA.  Left L4 screw: Positive activity at 32 mA.  L5: No activity greater than 40 mA.  BRIEF HISTORY: Shannon [name] is a 67 y.o. female who presents to my office with complaints of severe back buttock and radicular leg pain left worse than right.  Despite appropriate conservative management her quality of life continued to deteriorate.  Patient had neuropathic lower extremity leg pain due to severe foraminal stenosis as well as central and lateral recess stenosis primarily at the L4-5 level.  As result of the failure to improve with conservative management and the severity of her pain we elected to move forward with surgery.  All appropriate risks, benefits, and alternatives to surgery were discussed with the patient and consent was obtained.  PROCEDURE DETAILS: Patient was brought into the operating room and was properly positioned on the operating room table.  After induction with general anesthesia the patient was endotracheally intubated.  A timeout was taken to confirm all important data: including patient, procedure, and the level. Teds, SCD's were applied.   A Foley was inserted  by the nurse, and the neuromonitoring representative slide all appropriate needles for intraoperative SSEP and EMG monitoring.  Patient was turned prone onto the [facility] spine frame and all bony prominences were well-padded.  The back was then prepped and draped in a standard fashion.  Using  fluoroscopy I marked out the L4-L5 pedicles and marked out my midline incision.  This incision site was then infiltrated with quarter percent Marcaine with epinephrine.  Midline incision was made and sharp dissection was carried out down to the deep fascia.  The deep fascia was sharply incised and I stripped the paraspinal muscles to expose the L4 and the L5 spinous process as well as the L3-4 and L4-5 facet joints.   Using fluoroscopy I confirmed L3-4 and L4-5 facets.  Using a Bovie I remove the facet capsule from the L4-5 facet complex.  I now had excellent visualization of the posterior anatomy.  A high-speed bur was placed on the superior medial aspect of the L4 pedicle and confirmed its position with fluoroscopy.  After breach the cortex I used a hand-held pedicle awl to advance from the superior medial aspect of the pedicle towards the superior lateral aspect.  As I neared the lateral aspect of the pedicle I confirmed on the lateral view that I was beyond the posterior vertebral body.  Once I confirm trajectory advanced into the vertebral body.  The awl was removed and I palpated the canal with a ball-tipped feeler to confirm that it was intact.  I then placed the tap and then repalpated to confirm there was no  breach.  The appropriate size screw was placed and had excellent purchase.  This exact same technique on the contralateral side as well as at the L5 level.  Once all 4 pedicle screws were properly positioned I then directly stimulated them.  Both right pedicle screws had no activity greater than 40 mA as did the left L5.  The left L4 noted positive activity at 32 mA.  Imaging demonstrated satisfactory  trajectory and position of the screws.  At this point there is no evidence that there was breach or nerve compression.  With the pedicle screws in position I turned my attention to the decompression.  I removed the interspinous process at L4-5 and remove the bulk of the L4 spinous process.  All bone was kept for use as autograft bone graft.  I then used a 3 mm Kerrison rongeur to perform a generous laminotomy bilaterally.  I then gently dissected through the central raphae of the ligamentum flavum and then resected the ligamentum flavum to expose the superior portion of the thecal sac, I then used an osteotome to resect the entire inferior L4 facet.  Using a Penfield 4 I developed a plane between the thecal sac and the remaining portion of the ligamentum flavum.  Using the 3 and 2 mm Kerrison rongeurs I resected all of the ligamentum flavum and began dissecting in the lateral recess.  There was significant hard osteophyte formation causing lateral recess stenosis.  I gently dissected and mobilized the thecal sac and nerve root medially.  This allowed me to resect the medial portion of the superior L5 facet to further decompress the lateral recess.  I then removed the entire pars and expose the L4 nerve root.  I then used an osteotome to resect the superior portion of the L5 facet so I had completely decompressed this area.  I could now palpate the inferior and medial aspect of the L4 pedicle as well as the superior and medial portion of the L5 pedicle.  The L5 nerve root was identified and traced into the foramen.  Further decompression of the nerve root was done with Kerrison rongeurs.  The L4 nerve root was also identified and tracked back immediately and I confirmed an adequate decompression using a Kerrison rongeur.  At this point both nerve roots were free and clearly mobile and no longer under compression.  The thecal sac was retracted medially and an annulotomy was performed with a 15 blade scalpel.   Using curettes and Kerrison rongeurs as well as pituitary rongeurs I removed all of the disc material.  Sidecutting curettes were used to ensure I had removed disc material from the contralateral side.  Once I had bleeding subchondral bone I irrigated the wound copiously with normal saline.  I then placed my trial implants and elected to use the 7 x 9 x 24 implant.  I placed autograft bone into the space to aid in the overall fusion rate.  I then placed the cage into the disc space and malleted to the appropriate depth.  I confirmed satisfactory position in both planes using fluoroscopy.  I then sequentially elevated the anterior and posterior aspects of the cage until I had excellent overall contact and restoration of intervertebral space.  I then backfilled this cage with the DBX.   At this point I was quite pleased with the overall fixation.  The wound was copiously irrigated with normal saline and then using a high-speed bur I decorticated the L4-5 facet complex as well as  the L4 transverse process.  The posterior lateral gutter as well as the L4-5 facet packed with autograft bone.  The polyaxial heads were then attached to the screws and a appropriate size rod was placed and secured with a locking caps.  On the left side the polyaxial heads were applied and the rod was attached.  All 4 locking caps were tightened according manufacture standards.  All retractors were removed and final x-rays were taken.  They had satisfactory position of the intervertebral cage in the posterior pedicle screw construct.  After repeat irrigation a thrombin-soaked Gelfoam patty was placed over the laminotomy site.  I then closed the deep fascia with a running #1 strata fix suture.  I closed superficially with 2-0 Vicryl suture and 3-0 Monocryl for the skin.  Steri-Strips and dry dressings were applied and the patient was ultimately extubated transfer the PACU without incident.  The end of the case all needle sponge counts were  correct.  There were no adverse intraoperative events.  Venita Lick, MD 07/16/2022 3:06 PM

## 2022-07-16 NOTE — Anesthesia Procedure Notes (Signed)
Procedure Name: Intubation Date/Time: 07/16/2022 11:35 AM  Performed by: Michele Rockers, CRNAPre-anesthesia Checklist: Patient identified, Patient being monitored, Timeout performed, Emergency Drugs available and Suction available Patient Re-evaluated:Patient Re-evaluated prior to induction Oxygen Delivery Method: Circle System Utilized Preoxygenation: Pre-oxygenation with 100% oxygen Induction Type: IV induction Ventilation: Mask ventilation without difficulty Laryngoscope Size: Miller and 2 Grade View: Grade I Tube type: Oral Tube size: 7.0 mm Number of attempts: 1 Airway Equipment and Method: Stylet Placement Confirmation: ETT inserted through vocal cords under direct vision, positive ETCO2 and breath sounds checked- equal and bilateral Secured at: 21 cm Tube secured with: Tape Dental Injury: Teeth and Oropharynx as per pre-operative assessment

## 2022-07-16 NOTE — H&P (Signed)
History:  Shannon [name] is a very pleasant 67 year old woman with progressive back buttock and radicular leg pain. She has significant stenosis and facet arthrosis causing nerve compression at L4-5. Since she has severe radicular leg pain and back pain we will move forward with a TLIF to address the neural compression and the degenerative disc disease.  Past Medical History:  Diagnosis Date   COVID-19 virus infection 10/13/2018   Diabetes mellitus without complication ([place])    Hypercholesteremia    Sleep apnea    Newly diagnosed in September 2021    No Known Allergies  No current facility-administered medications on file prior to encounter.   Current Outpatient Medications on File Prior to Encounter  Medication Sig Dispense Refill   atorvastatin (LIPITOR) 20 MG tablet TAKE ONE TABLET BY MOUTH EVERY EVENING with A meal 90 tablet 1   Cholecalciferol (VITAMIN D-3 PO) Take 2,000 Units by mouth every evening.     cyclobenzaprine (FLEXERIL) 5 MG tablet Take 5 mg by mouth 3 (three) times daily as needed for muscle spasms.     gabapentin (NEURONTIN) 300 MG capsule Take 300 mg by mouth 3 (three) times daily.     glucagon (GLUCAGEN HYPOKIT) 1 MG SOLR injection Inject 1 mg into the vein once as needed for up to 1 dose for low blood sugar. 1 each 5   lisinopril (ZESTRIL) 10 MG tablet TAKE ONE TABLET BY MOUTH EVERY MORNING 90 tablet 0   metFORMIN (GLUCOPHAGE XR) 750 MG 24 hr tablet Take 1 tablet (750 mg total) by mouth daily with breakfast. 90 tablet 1   NON FORMULARY Pt uses a cpap nightly     traMADol (ULTRAM) 50 MG tablet Take 50 mg by mouth 3 (three) times daily as needed for moderate pain.     TRESIBA FLEXTOUCH 100 UNIT/ML FlexTouch Pen inject 15 units AT bedtime 15 mL 1   blood glucose meter kit and supplies KIT Dispense based on patient and insurance preference. Use 3 times daily as directed to check blood sugar. Dx code e11.65 (Patient not taking: Reported on 06/28/2022) 1 each 9   Blood  Glucose Monitoring Suppl (ONE TOUCH ULTRA MINI) w/Device KIT 1 each by Does not apply route 4 (four) times daily -  before meals and at bedtime. 1 kit 0   Continuous Blood Gluc Receiver (FREESTYLE LIBRE 14 DAY READER) DEVI USE TO CHECK BLOOD SUGARS FOUR TIMES A DAY DX:E11.65 1 each 1   Continuous Blood Gluc Sensor (FREESTYLE LIBRE 2 SENSOR) MISC USE TO check blood glucose AS DIRECTED AND CHANGE sensor every 14 DAYS 3 each 1   glucose blood (FREESTYLE TEST STRIPS) test strip Use as instructed 100 each 12   glucose blood (FREESTYLE TEST STRIPS) test strip Use as instructed (Patient not taking: Reported on 05/29/2022) 100 each 12   glucose blood test strip Use as instructed (Patient not taking: Reported on 06/28/2022) 100 each 5   glucose blood test strip Use as instructed to check blood sugar 3 times a day. Dx code e11.65 (Patient not taking: Reported on 05/29/2022) 100 each 12   Lancets (ONETOUCH ULTRASOFT) lancets Use as instructed to check blood sugars 4 times daily E11.9 (Patient not taking: Reported on 05/29/2022) 300 each 3    Physical Exam: Clinical exam: Shannon [name] is a pleasant individual, who appears younger than their stated age.  She is alert and orientated 3.  No shortness of breath, chest pain.  Abdomen is soft and non-tender, negative loss of bowel  and bladder control, no rebound tenderness.  Negative: skin lesions abrasions contusions  Peripheral pulses: 2+ peripheral pulses bilaterally. LE compartments are: Soft and nontender.  Gait pattern: Normal  Assistive devices: None  Neuro: Positive neuropathic left leg pain with numbness and dysesthesias in the L5 dermatome. 5/5 motor strength in the lower extremity. Negative Babinski test, no clonus, 1+ deep tendon reflexes at the knee absent at the Achilles.  Musculoskeletal: Significant low back pain radiating into the left lower extremity. No SI joint pain.  Imaging: X-rays of the lumbar spine dated 11/28/2021 demonstrate advanced  degenerative disc disease at L4-5 with facet arthropathy. No scoliosis or spondylolisthesis.  Lumbar MRI: completed on 12/31/2021. Moderate to severe right neuroforaminal narrowing at L4-5 with moderate spinal canal stenosis. Significant left lateral recess stenosis causing compression of the traversing L5 nerve root. Moderate neuroforaminal narrowing at L3-4. Mild degenerative disease at L5-S1 but no neural compression.  Lumbar MRI completed on 07/13/2022: No significant change in the severe lateral recess stenosis and moderate central stenosis at L4-5.  There is marked compression of the exiting left L4 and traversing L5 nerve root.  Patient has advanced degenerative disc disease at L4-5.  Moderate degenerative changes and stenosis noted L3-4 and L5-S1.  No abnormal marrow signal changes.  No fracture seen.   A/P:  Summary: Shannon [name] returns today for follow-up. Unfortunately despite physical therapy, and injection therapy her overall quality of life is continued to deteriorate. She complains of severe back pain and radicular left leg pain. Patient has recently completed physical therapy from January 23, 2022 through March 27, 2022 with no significant improvement. She has also had a recent epidural injection (SNRB) that did not provide any significant improvement in her pain or quality of life. The patient states she would like to move forward with surgical intervention. He given the failure to improve and the neurological deficits and degenerative disease I think it is reasonable. She has significant facet arthrosis foraminal stenosis and lateral recess stenosis at L4-5. I am recommending a TLIF at L4-5. This will allow [place] to address the foraminal stenosis and lateral recess stenosis on the left side. After completing the Gill decompression we will then stabilize the level with a intervertebral cage and pedicle screw construct. I have gone over the surgery with her in great detail including the risks, benefits,  and alternatives. Risks and benefits of spinal fusion: Infection, bleeding, death, stroke, paralysis, ongoing or worse pain, need for additional surgery, nonunion, leak of spinal fluid, adjacent segment degeneration requiring additional fusion surgery, Injury to abdominal vessels that can require anterior surgery to stop bleeding. Malposition of the cage and/or pedicle screws that could require additional surgery. Loss of bowel and bladder control. Postoperative hematoma causing neurologic compression that could require urgent or emergent re-operation.

## 2022-07-17 ENCOUNTER — Encounter (HOSPITAL_COMMUNITY): Payer: Self-pay | Admitting: Orthopedic Surgery

## 2022-07-17 LAB — GLUCOSE, CAPILLARY
Glucose-Capillary: 218 mg/dL — ABNORMAL HIGH (ref 70–99)
Glucose-Capillary: 159 mg/dL — ABNORMAL HIGH (ref 70–99)
Glucose-Capillary: 133 mg/dL — ABNORMAL HIGH (ref 70–99)
Glucose-Capillary: 168 mg/dL — ABNORMAL HIGH (ref 70–99)

## 2022-07-17 NOTE — Evaluation (Signed)
Occupational Therapy Evaluation/Discharge Patient Details Name: Shannon [name] MRN: [id number] DOB: 04-20-1956 Today's Date: 07/17/2022   History of Present Illness Pt is a 67 y/o female admitted for TLIF of L4-5 in setting of DDD with lumbar stenosis and radiculopathy. PMH: DM, sleep apnea   Clinical Impression   PTA, pt lives alone, typically Independent in all daily tasks without use of AD. Pt presents now with expected post op soreness, requiring increased time for ADLs/transfers though no physical assist needed to complete tasks including LSO brace mgmt. Educated re: back precautions for ADLs, IADL strategies, where assistance may be needed (heavier IADLs) and alternative tub transfer strategies. Pt does endorse some concerns regarding DC home though feel pt will do well with ADLs/mobility with prn assist for IADL tasks. No further skilled OT services needed at this time. Recommend BSC to place over toilet at home to maximize adherence to back precautions d/t low surface.     Recommendations for follow up therapy are one component of a multi-disciplinary discharge planning process, led by the attending physician.  Recommendations may be updated based on patient status, additional functional criteria and insurance authorization.   Follow Up Recommendations  No OT follow up     Assistance Recommended at Discharge PRN  Patient can return home with the following Assist for transportation;Assistance with cooking/housework    Functional Status Assessment  Patient has not had a recent decline in their functional status  Equipment Recommendations  BSC/3in1    Recommendations for Other Services       Precautions / Restrictions Precautions Precautions: Back Precaution Booklet Issued: Yes (comment) Required Braces or Orthoses: Spinal Brace Spinal Brace: Lumbar corset Restrictions Weight Bearing Restrictions: No      Mobility Bed Mobility Overal bed mobility: Modified Independent              General bed mobility comments: increased time/effort for log roll    Transfers Overall transfer level: Independent Equipment used: None               General transfer comment: increased time to rise during ADLs, no AD      Balance Overall balance assessment: No apparent balance deficits (not formally assessed)                                         ADL either performed or assessed with clinical judgement   ADL Overall ADL's : Modified independent                                       General ADL Comments: Increased time and effort required for all tasks though pt able to manage dressing tasks and LSO brace mgmt after initial education for back precautions. collaborated on simple meals to make (soup, sandwiches), assist with laundry, bringing in groceries (reports son can assist). Demonstrated alternative tub shower transfer to decrease fall risk and minimize pain. if assist needed for tub transfer, educated pt to wear robe to allow son to assist with transfer as pt can bathe self once safely in shower (pt reports son may not be comfortable assisting w/ this task fully)     Vision Ability to See in Adequate Light: 0 Adequate Patient Visual Report: No change from baseline Vision Assessment?: No apparent visual deficits     Perception  Praxis      Pertinent Vitals/Pain Pain Assessment Pain Assessment: Faces Faces Pain Scale: Hurts little more Pain Location: low back Pain Descriptors / Indicators: Guarding, Grimacing, Sore Pain Intervention(s): Monitored during session, Limited activity within patient's tolerance, Premedicated before session     Hand Dominance Right   Extremity/Trunk Assessment Upper Extremity Assessment Upper Extremity Assessment: Overall WFL for tasks assessed   Lower Extremity Assessment Lower Extremity Assessment: Defer to PT evaluation   Cervical / Trunk Assessment Cervical / Trunk  Assessment: Back Surgery   Communication Communication Communication: No difficulties   Cognition Arousal/Alertness: Awake/alert Behavior During Therapy: WFL for tasks assessed/performed, Flat affect Overall Cognitive Status: Within Functional Limits for tasks assessed                                       General Comments       Exercises     Shoulder Instructions      Home Living Family/patient expects to be discharged to:: Private residence Living Arrangements: Alone Available Help at Discharge: Family;Available PRN/intermittently Type of Home: House Home Access: Stairs to enter [employer] of Steps: 1   Home Layout: One level     Bathroom Shower/Tub: Teacher, early years/pre: Standard     Home Equipment: Shower seat;Hand held shower head          Prior Functioning/Environment Prior Level of Function : Independent/Modified Independent;Driving             Mobility Comments: no AD          OT Problem List: Pain      OT Treatment/Interventions:      OT Goals(Current goals can be found in the care plan section) Acute Rehab OT Goals Patient Stated Goal: recover well, pain control OT Goal Formulation: All assessment and education complete, DC therapy  OT Frequency:      Co-evaluation              AM-PAC OT "6 Clicks" Daily Activity     Outcome Measure Help from another person eating meals?: None Help from another person taking care of personal grooming?: None Help from another person toileting, which includes using toliet, bedpan, or urinal?: None Help from another person bathing (including washing, rinsing, drying)?: None Help from another person to put on and taking off regular upper body clothing?: None Help from another person to put on and taking off regular lower body clothing?: None 6 Click Score: 24   End of Session Equipment Utilized During Treatment: Back brace Nurse Communication: Mobility  status  Activity Tolerance: Patient tolerated treatment well Patient left: in bed;with call bell/phone within reach  OT Visit Diagnosis: Pain Pain - part of body:  (low back)                Time: 5409-8119 OT Time Calculation (min): 18 min Charges:  OT General Charges $OT Visit: 1 Visit OT Evaluation $OT Eval Low Complexity: 1 Low  Malachy Chamber, OTR/L Acute Rehab Services Office: [phone]   Layla Maw 07/17/2022, 8:24 AM

## 2022-07-17 NOTE — Anesthesia Postprocedure Evaluation (Signed)
Anesthesia Post Note  Patient: MARELY APGAR  Procedure(s) Performed: TRANSFORAMINAL LUMBAR INTERBODY FUSION (TLIF L4-5) (Spine Lumbar)     Patient location during evaluation: PACU Anesthesia Type: General Level of consciousness: awake and alert Pain management: pain level controlled Vital Signs Assessment: post-procedure vital signs reviewed and stable Respiratory status: spontaneous breathing, nonlabored ventilation, respiratory function stable and patient connected to nasal cannula oxygen Cardiovascular status: blood pressure returned to baseline and stable Postop Assessment: no apparent nausea or vomiting Anesthetic complications: no  No notable events documented.  Last Vitals:  Vitals:   07/17/22 0823 07/17/22 1156  BP: (!) 154/67 (!) 121/54  Pulse: 97 94  Resp: 18 18  Temp: 36.6 C 37.3 C  SpO2: 100% 100%    Last Pain:  Vitals:   07/17/22 1230  TempSrc:   PainSc: 8                  Barnet Glasgow

## 2022-07-17 NOTE — Progress Notes (Signed)
    Subjective: Procedure(s) (LRB): TRANSFORAMINAL LUMBAR INTERBODY FUSION (TLIF L4-5) (N/A) 1 Day Post-Op  Patient reports pain as 4 on 0-10 scale.  Reports decreased leg pain reports incisional back pain   Positive void Negative bowel movement Positive flatus Negative chest pain or shortness of breath  Objective: Vital signs in last 24 hours: Temp:  [97.8 F (36.6 C)-99.7 F (37.6 C)] 98.6 F (37 C) (01/23 0400) Pulse Rate:  [77-92] 90 (01/23 0400) Resp:  [11-24] 18 (01/23 0400) BP: (135-164)/(57-81) 142/65 (01/23 0400) SpO2:  [97 %-100 %] 100 % (01/23 0400) Weight:  [71.2 kg] 71.2 kg (01/22 0904)  Intake/Output from previous day: 01/22 0701 - 01/23 0700 In: 1150 [I.V.:700; IV Piggyback:450] Out: 9604 [Urine:1200; Blood:75]  Labs: No results for input(s): "WBC", "RBC", "HCT", "PLT" in the last 72 hours. No results for input(s): "NA", "K", "CL", "CO2", "BUN", "CREATININE", "GLUCOSE", "CALCIUM" in the last 72 hours. No results for input(s): "LABPT", "INR" in the last 72 hours.  Physical Exam: Neurologically intact ABD soft Intact pulses distally Dorsiflexion/Plantar flexion intact Incision: dressing C/D/I and no drainage Compartment soft Body mass index is 27.81 kg/m.   Assessment/Plan: Patient stable  xrays n/s Continue mobilization with physical therapy Continue care  Patient is doing quite well.  Dressings are clean dry and intact.  She is ambulated with assistance.  Patient is tolerating a regular diet.  Her primary complaint is incisional back pain. Patient does live alone and is requested evaluation for SNF discharge instead of planned discharge to home.  We will go ahead and make the appropriate referrals for SNF evaluation.  We will continue to work on mobilization, and medications to initiate bowel movement.  Melina Schools, MD [facility] [phone]

## 2022-07-17 NOTE — NC FL2 (Cosign Needed Addendum)
[place] LEVEL OF CARE FORM     IDENTIFICATION  Patient Name: Shannon [name] Birthdate: [birth year] Sex: female Admission Date (Current Location): 07/16/2022  [facility] and [place] Number:  Herbalist and Address:  [facility] [place], [place] [address], [place], [place] [address]      Provider Number: [id number]  Attending Physician Name and Address:  Melina Schools, MD  Relative Name and Phone Number:       Current Level of Care: Hospital Recommended Level of Care: [place] Prior Approval Number:    Date Approved/Denied:   PASRR Number:   [id number]  Discharge Plan: SNF    Current Diagnoses: Patient Active Problem List   Diagnosis Date Noted   S/P lumbar fusion 07/16/2022   Hypertensive retinopathy of both eyes 07/06/2020   Type 2 macular telangiectasis of right eye 05/04/2020   Sleep apnea in adult 03/16/2020   Vitreous floaters of left eye 12/29/2019   Other long term (current) drug therapy 11/16/2019   Snores 10/27/2019   Severe nonproliferative diabetic retinopathy of right eye, with macular edema, associated with type 2 diabetes mellitus ([place]) 10/06/2019   Severe nonproliferative diabetic retinopathy of left eye, with macular edema, associated with type 2 diabetes mellitus ([place]) 10/06/2019   Retinal exudates and deposits 10/06/2019   Other fatigue 11/20/2018   Hypokalemia 10/13/2018   Pyelonephritis 06/17/2018    Orientation RESPIRATION BLADDER Height & Weight     Self, Time, Situation, Place  Normal Continent Weight: 71.2 kg Height:  5\' 3"  (160 cm)  BEHAVIORAL SYMPTOMS/MOOD NEUROLOGICAL BOWEL NUTRITION STATUS      Continent Diet (carb modified with thin liquids)  AMBULATORY STATUS COMMUNICATION OF NEEDS Skin   Limited Assist Verbally Surgical wounds (surgical incision to back)                       Personal Care Assistance Level of Assistance  Bathing, Feeding, Dressing Bathing Assistance:  Limited assistance Feeding assistance: Independent Dressing Assistance: Limited assistance     Functional Limitations Info  Sight, Hearing, Speech Sight Info: Adequate Hearing Info: Adequate Speech Info: Adequate    SPECIAL CARE FACTORS FREQUENCY  PT (By licensed PT), OT (By licensed OT)     PT Frequency: 5x/wk OT Frequency: 5x/wk            Contractures Contractures Info: Not present    Additional Factors Info  Code Status, Allergies, Insulin Sliding Scale Code Status Info: Full Allergies Info: NKA   Insulin Sliding Scale Info: Novolog 0-15 units SQ three times a day/ Novolog 0-5 units SQ at  bedtime       Current Medications (07/17/2022):  This is the current hospital active medication list Current Facility-Administered Medications  Medication Dose Route Frequency Provider Last Rate Last Admin   0.9 %  sodium chloride infusion  250 mL Intravenous Continuous Melina Schools, MD       acetaminophen (TYLENOL) tablet 650 mg  650 mg Oral Q4H PRN Melina Schools, MD       Or   acetaminophen (TYLENOL) suppository 650 mg  650 mg Rectal Q4H PRN Melina Schools, MD       famotidine (PEPCID) tablet 20 mg  20 mg Oral q morning Melina Schools, MD   20 mg at 07/17/22 0936   HYDROmorphone (DILAUDID) injection 1 mg  1 mg Intravenous Q2H PRN Melina Schools, MD       insulin aspart (novoLOG) injection 0-15 Units  0-15 Units Subcutaneous TID WC Melina Schools, MD   5 Units at 07/17/22 2025   insulin aspart (novoLOG) injection 0-5 Units  0-5 Units Subcutaneous QHS Melina Schools, MD       lactated ringers infusion   Intravenous Continuous Melina Schools, MD 85 mL/hr at 07/16/22 1824 New Bag at 07/16/22 1824   lisinopril (ZESTRIL) tablet 10 mg  10 mg Oral q morning Melina Schools, MD   10 mg at 07/17/22 4270   menthol-cetylpyridinium (CEPACOL) lozenge 3 mg  1 lozenge Oral PRN Melina Schools, MD       Or   phenol (CHLORASEPTIC) mouth spray 1 spray  1 spray Mouth/Throat PRN Melina Schools, MD        metFORMIN (GLUCOPHAGE-XR) 24 hr tablet 750 mg  750 mg Oral Q breakfast Melina Schools, MD   750 mg at 07/17/22 0818   methocarbamol (ROBAXIN) tablet 500 mg  500 mg Oral Q6H PRN Melina Schools, MD   500 mg at 07/16/22 1945   Or   methocarbamol (ROBAXIN) 500 mg in dextrose 5 % 50 mL IVPB  500 mg Intravenous Q6H PRN Melina Schools, MD       ondansetron [facility]) tablet 4 mg  4 mg Oral Q6H PRN Melina Schools, MD       Or   ondansetron [facility]) injection 4 mg  4 mg Intravenous Q6H PRN Melina Schools, MD       oxyCODONE (Oxy IR/ROXICODONE) immediate release tablet 10 mg  10 mg Oral Q3H PRN Melina Schools, MD   10 mg at 07/17/22 6237   oxyCODONE (Oxy IR/ROXICODONE) immediate release tablet 5 mg  5 mg Oral Q3H PRN Melina Schools, MD   5 mg at 07/16/22 2333   polyethylene glycol (MIRALAX / GLYCOLAX) packet 17 g  17 g Oral Daily PRN Melina Schools, MD       senna-docusate (Senokot-S) tablet 1 tablet  1 tablet Oral BID Melina Schools, MD   1 tablet at 07/17/22 6283   sodium chloride flush (NS) 0.9 % injection 3 mL  3 mL Intravenous Q12H Melina Schools, MD       sodium chloride flush (NS) 0.9 % injection 3 mL  3 mL Intravenous PRN Melina Schools, MD         Discharge Medications: Please see discharge summary for a list of discharge medications.  Relevant Imaging Results:  Relevant Lab Results:   Additional Information SS#: [id number]  Pollie Friar, RN

## 2022-07-17 NOTE — TOC Initial Note (Addendum)
Transition of Care [facility]) - Initial/Assessment Note    Patient Details  Name: Shannon [name] MRN: [id number] Date of Birth: 1955/12/07  Transition of Care [facility]) CM/SW Contact:    Pollie Friar, RN Phone Number: 07/17/2022, 11:03 AM  Clinical Narrative:                 Pt is s/p TRANSFORAMINAL LUMBAR INTERBODY FUSION (TLIF L4-5). She is requesting a SNF rehab stay prior to returning home. CM has faxed her out in the [facility] area per her request. She prefers [facility]. CM will update Nikki at [facility].  Will need insurance auth once facility has been designated. TOC following.  1408: [facility] has offered and pt accepted. Insurance started through [facility] for admission tomorrow. Also asked for [facility] for transport.  Expected Discharge Plan: Skilled Nursing Facility Barriers to Discharge: Continued Medical Work up   Patient Goals and CMS Choice   CMS Medicare.gov Compare Post Acute Care list provided to:: Patient Choice offered to / list presented to : Patient      Expected Discharge Plan and Services In-house Referral: Clinical Social Work Discharge Planning Services: CM Consult Post Acute Care Choice: [place] Living arrangements for the past 2 months: [place]                                      Prior Living Arrangements/Services Living arrangements for the past 2 months: Single Family Home Lives with:: Adult Children Patient language and need for interpreter reviewed:: Yes Do you feel safe going back to the place where you live?: Yes            Criminal Activity/Legal Involvement Pertinent to Current Situation/Hospitalization: No - Comment as needed  Activities of Daily Living      Permission Sought/Granted                  Emotional Assessment Appearance:: Appears stated age Attitude/Demeanor/Rapport: Engaged Affect (typically observed): Accepting Orientation: : Oriented to Self, Oriented to Place,  Oriented to  Time, Oriented to Situation   Psych Involvement: No (comment)  Admission diagnosis:  S/P lumbar fusion [Z98.1] Patient Active Problem List   Diagnosis Date Noted   S/P lumbar fusion 07/16/2022   Hypertensive retinopathy of both eyes 07/06/2020   Type 2 macular telangiectasis of right eye 05/04/2020   Sleep apnea in adult 03/16/2020   Vitreous floaters of left eye 12/29/2019   Other long term (current) drug therapy 11/16/2019   Snores 10/27/2019   Severe nonproliferative diabetic retinopathy of right eye, with macular edema, associated with type 2 diabetes mellitus ([place]) 10/06/2019   Severe nonproliferative diabetic retinopathy of left eye, with macular edema, associated with type 2 diabetes mellitus ([place]) 10/06/2019   Retinal exudates and deposits 10/06/2019   Other fatigue 11/20/2018   Hypokalemia 10/13/2018   Pyelonephritis 06/17/2018   PCP:  [name], FNP Pharmacy:   [facility] - [place], [place] - [address] [place] [address] Phone: [phone] Fax: [phone]  [id number] - [place], [place] [address] [place] [address] Phone: [phone] Fax: [phone]     Social Determinants of Health ([place]) Social History: [place]: No Food Insecurity (11/30/2021)  Transportation Needs:  No Transportation Needs (11/30/2021)  Depression (PHQ2-9): Low Risk  (05/29/2022)  Financial Resource Strain: Low Risk  (11/30/2021)  Physical Activity: Sufficiently Active (11/30/2021)  Stress: No Stress Concern Present (11/30/2021)  Tobacco Use: Medium Risk (07/16/2022)   SDOH Interventions:     Readmission Risk Interventions     No data to display

## 2022-07-17 NOTE — Evaluation (Signed)
Physical Therapy Evaluation  Patient Details Name: Shannon [name] MRN: [id number] DOB: May 20, 1956 Today's Date: 07/17/2022  History of Present Illness  Pt is a 67 y/o female admitted for TLIF of L4-5 in setting of DDD with lumbar stenosis and radiculopathy. PMH: DM, sleep apnea   Clinical Impression  Pt admitted with above diagnosis. At the time of PT eval, pt was able to demonstrate transfers with modified independence and ambulation with min guard assist to supervision with RW for support. Initially without AD however RW helpful for pain management and energy conservation. Pt moving very slowly due to pain/spasms. She is requesting SNF level rehab "for a few days" at d/c as she lives alone. Pt did say her son, who works nights, could likely be available to stay with her during the day while he is off. Anticipate pt will continue to progress well as pain improves. Pt was educated on precautions, brace application/wearing schedule, appropriate activity progression, and car transfer. Pt currently with functional limitations due to the deficits listed below (see PT Problem List). Pt will benefit from skilled PT to increase their independence and safety with mobility to allow discharge to the venue listed below.         Recommendations for follow up therapy are one component of a multi-disciplinary discharge planning process, led by the attending physician.  Recommendations may be updated based on patient status, additional functional criteria and insurance authorization.  Follow Up Recommendations Home health PT      Assistance Recommended at Discharge PRN  Patient can return home with the following  A little help with walking and/or transfers;A little help with bathing/dressing/bathroom;Assistance with cooking/housework;Assist for transportation;Help with stairs or ramp for entrance    Equipment Recommendations Rolling walker (2 wheels)  Recommendations for Other Services       Functional  Status Assessment Patient has had a recent decline in their functional status and demonstrates the ability to make significant improvements in function in a reasonable and predictable amount of time.     Precautions / Restrictions Precautions Precautions: Back Precaution Booklet Issued: Yes (comment) Precaution Comments: Reviewed handout and pt was cued for precautions during functional mobility. Required Braces or Orthoses: Spinal Brace Spinal Brace: Lumbar corset;Applied in sitting position Restrictions Weight Bearing Restrictions: No      Mobility  Bed Mobility Overal bed mobility: Modified Independent             General bed mobility comments: increased time/effort for log roll but able to complete with HOB flat and rails lowered to simulate home environment.    Transfers Overall transfer level: Modified independent Equipment used: None, Rolling walker (2 wheels)               General transfer comment: Increased time and effort to power up to full stand. VC's for hand placement on seated surface for safety.    Ambulation/Gait Ambulation/Gait assistance: Supervision, Min guard Gait Distance (Feet): 200 Feet Assistive device: None, Rolling walker (2 wheels) Gait Pattern/deviations: Step-through pattern, Decreased stride length, Trunk flexed, Narrow base of support Gait velocity: Decreased Gait velocity interpretation: <1.31 ft/sec, indicative of household ambulator   General Gait Details: Slow and guarded due to pain. Pt was relying heavily on railing in hallway for support. Min guard assist without AD, and progressed to supervision for safety with RW. Pt continued to move slowly with the RW however overall improved.  Stairs            Emergency planning/management officer  Modified Rankin (Stroke Patients Only)       Balance Overall balance assessment: No apparent balance deficits (not formally assessed)                                            Pertinent Vitals/Pain Pain Assessment Pain Assessment: Faces Faces Pain Scale: Hurts whole lot Pain Location: low back Pain Descriptors / Indicators: Guarding, Grimacing, Sore Pain Intervention(s): Limited activity within patient's tolerance, Monitored during session, Repositioned    Home Living Family/patient expects to be discharged to:: Private residence Living Arrangements: Alone Available Help at Discharge: Family;Available PRN/intermittently Type of Home: House Home Access: Stairs to enter   [employer] of Steps: 1   Home Layout: One level Home Equipment: Shower seat;Hand held shower head      Prior Function Prior Level of Function : Independent/Modified Independent;Driving             Mobility Comments: no AD       Hand Dominance   Dominant Hand: Right    Extremity/Trunk Assessment   Upper Extremity Assessment Upper Extremity Assessment: Defer to OT evaluation    Lower Extremity Assessment Lower Extremity Assessment: Generalized weakness (Mild; consistent with pre-op diagnosis and acute pain)    Cervical / Trunk Assessment Cervical / Trunk Assessment: Back Surgery  Communication   Communication: No difficulties  Cognition Arousal/Alertness: Awake/alert Behavior During Therapy: WFL for tasks assessed/performed, Flat affect Overall Cognitive Status: Within Functional Limits for tasks assessed                                          General Comments      Exercises     Assessment/Plan    PT Assessment Patient needs continued PT services  PT Problem List Decreased strength;Decreased range of motion;Decreased activity tolerance;Decreased balance;Decreased mobility;Decreased knowledge of use of DME;Decreased safety awareness;Decreased knowledge of precautions;Pain       PT Treatment Interventions DME instruction;Gait training;Functional mobility training;Therapeutic activities;Therapeutic exercise;Balance  training;Patient/family education    PT Goals (Current goals can be found in the Care Plan section)  Acute Rehab PT Goals Patient Stated Goal: Be able to manage at home alone PT Goal Formulation: With patient Time For Goal Achievement: 07/24/22 Potential to Achieve Goals: Good    Frequency Min 5X/week     Co-evaluation               AM-PAC PT "6 Clicks" Mobility  Outcome Measure Help needed turning from your back to your side while in a flat bed without using bedrails?: None Help needed moving from lying on your back to sitting on the side of a flat bed without using bedrails?: None Help needed moving to and from a bed to a chair (including a wheelchair)?: None Help needed standing up from a chair using your arms (e.g., wheelchair or bedside chair)?: None Help needed to walk in hospital room?: A Little Help needed climbing 3-5 steps with a railing? : A Little 6 Click Score: 22    End of Session Equipment Utilized During Treatment: Gait belt;Back brace Activity Tolerance: Patient tolerated treatment well Patient left: in bed;with call bell/phone within reach Nurse Communication: Mobility status PT Visit Diagnosis: Unsteadiness on feet (R26.81);Pain Pain - part of body:  (back)    Time: 1191-4782  PT Time Calculation (min) (ACUTE ONLY): 39 min   Charges:   PT Evaluation $PT Eval Low Complexity: 1 Low PT Treatments $Gait Training: 23-37 mins        Rolinda Roan, PT, DPT Acute Rehabilitation Services Secure Chat Preferred Office: [phone]   Thelma Comp 07/17/2022, 11:29 AM

## 2022-07-18 LAB — GLUCOSE, CAPILLARY
Glucose-Capillary: 211 mg/dL — ABNORMAL HIGH (ref 70–99)
Glucose-Capillary: 166 mg/dL — ABNORMAL HIGH (ref 70–99)

## 2022-07-18 NOTE — Plan of Care (Signed)
Pt and family given D/C instructions with verbal understanding. Rx's were given to the Pt. Pt's incision is clean and dry with no sign of infection. Pt's IV was removed prior to D/C. Pt's Home Health was arranged by TOC. Pt received RW and 3-n-1 from [facility] per MD order. Pt D/C'd home via wheelchair per MD order. Pt is stable @ D/C and has no other needs at this time. Holli Humbles, RN

## 2022-07-18 NOTE — Progress Notes (Signed)
Physical Therapy Treatment Patient Details Name: Shannon [name] MRN: [id number] DOB: 1955-11-13 Today's Date: 07/18/2022   History of Present Illness Pt is a 67 y/o female admitted for TLIF of L4-5 in setting of DDD with lumbar stenosis and radiculopathy. PMH: DM, sleep apnea    PT Comments    Continuing work on functional mobility and activity tolerance;  Making progress towards goals, with incr amb distance despite being more sore compared to yesterday's session; Pt is hopeful for insurance auth to be able to go to SNF for post-acute rehab; we discussed plans/options for just in case she must dc home, rec HHPT, RW, and 3in1 for home   Recommendations for follow up therapy are one component of a multi-disciplinary discharge planning process, led by the attending physician.  Recommendations may be updated based on patient status, additional functional criteria and insurance authorization.  Follow Up Recommendations  Home health PT     Assistance Recommended at Discharge PRN  Patient can return home with the following A little help with walking and/or transfers;A little help with bathing/dressing/bathroom;Assistance with cooking/housework;Assist for transportation;Help with stairs or ramp for entrance   Equipment Recommendations  Rolling walker (2 wheels), 3in1   Recommendations for Other Services       Precautions / Restrictions Precautions Precautions: Back Precaution Booklet Issued: Yes (comment) Precaution Comments: Reviewed handout and pt was cued for precautions during functional mobility. Required Braces or Orthoses: Spinal Brace Spinal Brace: Lumbar corset;Applied in sitting position Restrictions Weight Bearing Restrictions: No     Mobility  Bed Mobility Overal bed mobility: Modified Independent             General bed mobility comments: increased time/effort for log roll but able to complete with HOB flat and rails lowered to simulate home environment.     Transfers Overall transfer level: Needs assistance Equipment used: Rolling walker (2 wheels) Transfers: Sit to/from Stand Sit to Stand: Supervision           General transfer comment: Increased time and effort to power up to full stand. VC's for hand placement on seated surface for safety.    Ambulation/Gait Ambulation/Gait assistance: Supervision, Min guard Gait Distance (Feet): 300 Feet Assistive device: Rolling walker (2 wheels) Gait Pattern/deviations: Step-through pattern, Decreased stride length, Trunk flexed, Narrow base of support       General Gait Details: Slow and guarded   Stairs             Wheelchair Mobility    Modified Rankin (Stroke Patients Only)       Balance Overall balance assessment: Mild deficits observed, not formally tested                                          Cognition Arousal/Alertness: Awake/alert Behavior During Therapy: WFL for tasks assessed/performed, Flat affect Overall Cognitive Status: Within Functional Limits for tasks assessed                                          Exercises      General Comments        Pertinent Vitals/Pain Pain Assessment Pain Assessment: 0-10 Pain Score: 8  Pain Location: low back Pain Descriptors / Indicators: Guarding, Grimacing, Sore Pain Intervention(s): Limited activity within patient's tolerance    Home  Living                          Prior Function            PT Goals (current goals can now be found in the care plan section) Acute Rehab PT Goals Patient Stated Goal: Be able to manage at home alone PT Goal Formulation: With patient Time For Goal Achievement: 07/24/22 Potential to Achieve Goals: Good Progress towards PT goals: Progressing toward goals    Frequency    Min 5X/week      PT Plan Current plan remains appropriate (discussed plans/options for just in case she must dc home, rec HHPT, RW, and 3in1 for home  )    Co-evaluation              AM-PAC PT "6 Clicks" Mobility   Outcome Measure  Help needed turning from your back to your side while in a flat bed without using bedrails?: None Help needed moving from lying on your back to sitting on the side of a flat bed without using bedrails?: None Help needed moving to and from a bed to a chair (including a wheelchair)?: None Help needed standing up from a chair using your arms (e.g., wheelchair or bedside chair)?: None Help needed to walk in hospital room?: A Little Help needed climbing 3-5 steps with a railing? : A Little 6 Click Score: 22    End of Session Equipment Utilized During Treatment: Back brace Activity Tolerance: Patient tolerated treatment well Patient left: in chair;with call bell/phone within reach Nurse Communication: Mobility status PT Visit Diagnosis: Unsteadiness on feet (R26.81);Pain Pain - part of body:  (back)     Time: 6433-2951 PT Time Calculation (min) (ACUTE ONLY): 42 min  Charges:  $Gait Training: 23-37 mins $Therapeutic Activity: 8-22 mins                     Roney Marion, [place] Office [phone]    Shannon [name] 07/18/2022, 3:56 PM

## 2022-07-18 NOTE — TOC Transition Note (Signed)
Transition of Care [facility]) - CM/SW Discharge Note   Patient Details  Name: Shannon [name] MRN: [id number] Date of Birth: 10-10-1955  [facility]) CM/SW Contact:  Pollie Friar, RN Phone Number: 07/18/2022, 4:08 PM   Clinical Narrative:    [facility] has denied the patient for SNF rehab. Patient aware and is discharging home with home health services through [facility]. Amy with [facility] is also going to arrange custodial care at home for the patient. Patient not happy but voiced understanding.  Pt has transportation home.    Final next level of care: Home w Home Health Services Barriers to Discharge: No Barriers Identified   Patient Goals and CMS Choice CMS Medicare.gov Compare Post Acute Care list provided to:: Patient Choice offered to / list presented to : Patient  Discharge Placement                         Discharge Plan and Services Additional resources added to the After Visit Summary for   In-house Referral: Clinical Social Work Discharge Planning Services: CM Consult Post Acute Care Choice: Skilled Nursing Facility                    HH Arranged: PT, OT [facility] Agency: [facility] Date [place]: 07/18/22   Representative spoke with at [place]: Amy  Social Determinants of Health ([place]) Interventions SDOH Screenings   Food Insecurity: No Food Insecurity (11/30/2021)  Transportation Needs: No Transportation Needs (11/30/2021)  Depression (PHQ2-9): Low Risk  (05/29/2022)  Financial Resource Strain: Low Risk  (11/30/2021)  Physical Activity: Sufficiently Active (11/30/2021)  Stress: No Stress Concern Present (11/30/2021)  Tobacco Use: Medium Risk (07/17/2022)     Readmission Risk Interventions     No data to display

## 2022-07-18 NOTE — Discharge Summary (Signed)
Patient ID: Shannon [name] MRN: [id number] DOB/AGE: March 22, 1956 67 y.o.  Admit date: 07/16/2022 Discharge date: 07/18/2022  Admission Diagnoses:  Principal Problem:   S/P lumbar fusion   Discharge Diagnoses:  Principal Problem:   S/P lumbar fusion  status post Procedure(s): TRANSFORAMINAL LUMBAR INTERBODY FUSION (TLIF L4-5)  Past Medical History:  Diagnosis Date   COVID-19 virus infection 10/13/2018   Diabetes mellitus without complication (HCC)    Hypercholesteremia    Sleep apnea    Newly diagnosed in September 2021    Surgeries: Procedure(s): TRANSFORAMINAL LUMBAR INTERBODY FUSION (TLIF L4-5) on 07/16/2022   Consultants:   Discharged Condition: Improved  Hospital Course: Shannon [name] is an 67 y.o. female who was admitted 07/16/2022 for operative treatment of S/P lumbar fusion. Patient failed conservative treatments (please see the history and physical for the specifics) and had severe unremitting pain that affects sleep, daily activities and work/hobbies. After pre-op clearance, the patient was taken to the operating room on 07/16/2022 and underwent  Procedure(s): TRANSFORAMINAL LUMBAR INTERBODY FUSION (TLIF L4-5).    Patient was given perioperative antibiotics:  Anti-infectives (From admission, onward)    Start     Dose/Rate Route Frequency Ordered Stop   07/16/22 1900  ceFAZolin (ANCEF) IVPB 1 g/50 mL premix        1 g 100 mL/hr over 30 Minutes Intravenous Every 8 hours 07/16/22 1629 07/17/22 0042   07/16/22 0858  ceFAZolin (ANCEF) 2-4 GM/100ML-% IVPB       Note to Pharmacy: Nanine Means S: cabinet override      07/16/22 0858 07/16/22 1129   07/16/22 0856  ceFAZolin (ANCEF) IVPB 2g/100 mL premix        2 g 200 mL/hr over 30 Minutes Intravenous 30 min pre-op 07/16/22 0856 07/16/22 1145        Patient was given sequential compression devices and early ambulation to prevent DVT.   Patient benefited maximally from hospital stay and there were no  complications. At the time of discharge, the patient was urinating/moving their bowels without difficulty, tolerating a regular diet, pain is controlled with oral pain medications and they have been cleared by PT/OT.   Recent vital signs: Patient Vitals for the past 24 hrs:  BP Temp Temp src Pulse Resp SpO2  07/18/22 0730 131/61 98.6 F (37 C) Oral 97 16 98 %  07/18/22 0344 (!) 140/64 99.7 F (37.6 C) Oral (!) 103 18 98 %  07/17/22 2253 (!) 126/48 99.3 F (37.4 C) Oral 99 18 99 %  07/17/22 1926 136/62 100.2 F (37.9 C) Oral 98 18 100 %  07/17/22 1608 (!) 136/52 99.9 F (37.7 C) Oral 100 18 98 %  07/17/22 1156 (!) 121/54 99.1 F (37.3 C) Oral 94 18 100 %  07/17/22 0823 (!) 154/67 97.8 F (36.6 C) Oral 97 18 100 %     Recent laboratory studies: No results for input(s): "WBC", "HGB", "HCT", "PLT", "NA", "K", "CL", "CO2", "BUN", "CREATININE", "GLUCOSE", "INR", "CALCIUM" in the last 72 hours.  Invalid input(s): "PT", "2"   Discharge Medications:   Allergies as of 07/18/2022   No Known Allergies      Medication List     STOP taking these medications    blood glucose meter kit and supplies Kit   cyclobenzaprine 5 MG tablet Commonly known as: FLEXERIL   FREESTYLE TEST STRIPS test strip Generic drug: glucose blood   gabapentin 300 MG capsule Commonly known as: NEURONTIN   glucose blood test strip  NON FORMULARY   onetouch ultrasoft lancets   traMADol 50 MG tablet Commonly known as: ULTRAM       TAKE these medications    atorvastatin 20 MG tablet Commonly known as: LIPITOR TAKE ONE TABLET BY MOUTH EVERY EVENING with A meal   famotidine 20 MG tablet Commonly known as: PEPCID Take 1 tablet (20 mg total) by mouth every morning.   FreeStyle Libre 14 Day Reader Kerrin Mo USE TO CHECK BLOOD SUGARS FOUR TIMES A DAY DX:E11.65   FreeStyle Libre 2 Sensor Misc USE TO check blood glucose AS DIRECTED AND CHANGE sensor every 14 DAYS   GlucaGen HypoKit 1 MG Solr  injection Generic drug: glucagon Inject 1 mg into the vein once as needed for up to 1 dose for low blood sugar.   lisinopril 10 MG tablet Commonly known as: ZESTRIL TAKE ONE TABLET BY MOUTH EVERY MORNING   metFORMIN 750 MG 24 hr tablet Commonly known as: Glucophage XR Take 1 tablet (750 mg total) by mouth daily with breakfast.   methocarbamol 500 MG tablet Commonly known as: ROBAXIN Take 1 tablet (500 mg total) by mouth every 8 (eight) hours as needed for up to 5 days for muscle spasms.   ondansetron 4 MG tablet Commonly known as: Zofran Take 1 tablet (4 mg total) by mouth every 8 (eight) hours as needed for nausea or vomiting.   ONE TOUCH ULTRA MINI w/Device Kit 1 each by Does not apply route 4 (four) times daily -  before meals and at bedtime.   oxyCODONE-acetaminophen 10-325 MG tablet Commonly known as: Percocet Take 1 tablet by mouth every 6 (six) hours as needed for up to 5 days for pain.   Semaglutide (1 MG/DOSE) 4 MG/3ML Sopn Inject 1 mg into the skin once a week.   Tyler Aas FlexTouch 100 UNIT/ML FlexTouch Pen Generic drug: insulin degludec inject 15 units AT bedtime   VITAMIN D-3 PO Take 2,000 Units by mouth every evening.        Diagnostic Studies: DG Lumbar Spine 2-3 Views  Result Date: 07/16/2022 CLINICAL DATA:  Elective surgery, L4-L5 TLIF EXAM: LUMBAR SPINE - 2-3 VIEW COMPARISON:  None Available. FINDINGS: Intraoperative images during L4-L5 anterior and posterior fusion. Intact hardware. IMPRESSION: Intraoperative images during L4-L5 lumbar fusion. No evidence of immediate complication. Electronically Signed   By: Maurine Simmering M.D.   On: 07/16/2022 15:27   DG C-Arm 1-60 Min-No Report  Result Date: 07/16/2022 Fluoroscopy was utilized by the requesting physician.  No radiographic interpretation.   DG C-Arm 1-60 Min-No Report  Result Date: 07/16/2022 Fluoroscopy was utilized by the requesting physician.  No radiographic interpretation.   DG C-Arm 1-60  Min-No Report  Result Date: 07/16/2022 Fluoroscopy was utilized by the requesting physician.  No radiographic interpretation.    Discharge Instructions     Incentive spirometry RT   Complete by: As directed         Follow-up Information     Melina Schools, MD. Schedule an appointment as soon as possible for a visit in 2 week(s).   Specialty: Orthopedic Surgery Why: If symptoms worsen, For suture removal, For wound re-check Contact information: [address] [place] [address] [phone]                 Discharge Plan:  discharge to skilled nursing facility  Disposition: Jamyria is a very pleasant 67 year old woman who lives at home independently by herself.  Underwent a transforaminal lumbar interbody fusion L4-5 for degenerative lumbar  disc disease with severe foraminal and lateral recess stenosis producing radicular leg pain.  Patient surgical procedure was uneventful and postoperatively her radicular leg pain improved.  She has been seen by both physical and Occupational Therapy and cleared for discharge to SNF.  Patient is ambulating, she is neurologically intact, and tolerating a regular diet.  Her pain is well-controlled with oral medications.  Discharge plan is to skilled nursing facility.  She will continue ambulating and doing gentle lower extremity range of motion exercises.  She will follow-up with me in 2 weeks for wound check.  Patient is given a prescription for Percocet, Robaxin, and Zofran.  All appropriate discharge instructions have also been provided.    Signed: Dahlia Bailiff for Dr. Melina Schools [facility] [phone] 07/18/2022, 7:53 AM

## 2022-07-19 ENCOUNTER — Telehealth: Payer: Self-pay | Admitting: *Deleted

## 2022-07-19 ENCOUNTER — Telehealth: Payer: Self-pay

## 2022-07-19 NOTE — Patient Outreach (Signed)
  Care Coordination TOC Note Transition Care Management Follow-up Telephone Call Date of discharge and from where: [facility] [phone] Back pain How have you been since you were released from the hospital? I am still having some pain radiating down my leg. It feels like a tooth ache. I am getting ready to take some pain medication and lay down.  Any questions or concerns? No  Items Reviewed: Did the pt receive and understand the discharge instructions provided? Yes  Medications obtained and verified? Yes  Other? No  Any new allergies since your discharge? No  Dietary orders reviewed? No Do you have support at home? Yes   Home Care and Equipment/Supplies: Were home health services ordered? Yes PT/OT If so, what is the name of the agency? [facility]  Has the agency set up a time to come to the patient's home? yes Were any new equipment or medical supplies ordered?  No What is the name of the medical supply agency? N  Were you able to get the supplies/equipment? not applicable Do you have any questions related to the use of the equipment or supplies? No  Functional Questionnaire: (I = Independent and D = Dependent) ADLs: D   Bathing/Dressing- I  Meal Prep- D  Eating- I  Maintaining continence- I  Transferring/Ambulation- I  Managing Meds- I  Follow up appointments reviewed:  PCP Hospital f/u appt confirmed? No  . [facility] f/u appt confirmed? No Patient wants to schedule herself Are transportation arrangements needed? No  If their condition worsens, is the pt aware to call PCP or go to the Emergency Dept.? Yes Was the patient provided with contact information for the PCP's office or ED? Yes Was to pt encouraged to call back with questions or concerns? Yes  SDOH assessments and interventions completed:   Yes SDOH Interventions Today    Flowsheet Row Most Recent Value  SDOH Interventions   Food Insecurity Interventions Intervention Not Indicated  Housing Interventions  Intervention Not Indicated  Transportation Interventions Intervention Not Indicated       Care Coordination Interventions:  RN discussed HTA custodial care with patient. Patient given the number and will call to see if she qualifies.     Encounter Outcome:  Pt. Visit Completed    [facility] [phone]

## 2022-07-19 NOTE — Patient Outreach (Signed)
  Care Coordination TOC Note Transition Care Management Unsuccessful Follow-up Telephone Call  Date of discharge and from where:  07/18/22-[facility]   Attempts:  1st Attempt  Reason for unsuccessful TCM follow-up call:  Left voice message    Jaquaya Coyle, RN,BSN,CCM [facility] Care Management Telephonic Care Management Coordinator Direct Phone: [phone] Toll Free: [phone] Fax: [phone]   

## 2022-07-21 DIAGNOSIS — Z794 Long term (current) use of insulin: Secondary | ICD-10-CM | POA: Diagnosis not present

## 2022-07-21 DIAGNOSIS — E7800 Pure hypercholesterolemia, unspecified: Secondary | ICD-10-CM | POA: Diagnosis not present

## 2022-07-21 DIAGNOSIS — Z7984 Long term (current) use of oral hypoglycemic drugs: Secondary | ICD-10-CM | POA: Diagnosis not present

## 2022-07-21 DIAGNOSIS — Z4789 Encounter for other orthopedic aftercare: Secondary | ICD-10-CM | POA: Diagnosis not present

## 2022-07-21 DIAGNOSIS — E119 Type 2 diabetes mellitus without complications: Secondary | ICD-10-CM | POA: Diagnosis not present

## 2022-07-21 DIAGNOSIS — Z7985 Long-term (current) use of injectable non-insulin antidiabetic drugs: Secondary | ICD-10-CM | POA: Diagnosis not present

## 2022-07-21 DIAGNOSIS — G4730 Sleep apnea, unspecified: Secondary | ICD-10-CM | POA: Diagnosis not present

## 2022-07-21 DIAGNOSIS — Z981 Arthrodesis status: Secondary | ICD-10-CM | POA: Diagnosis not present

## 2022-07-24 ENCOUNTER — Telehealth: Payer: Self-pay

## 2022-07-24 NOTE — Telephone Encounter (Signed)
Error

## 2022-07-27 DIAGNOSIS — Z981 Arthrodesis status: Secondary | ICD-10-CM | POA: Diagnosis not present

## 2022-07-27 DIAGNOSIS — E7800 Pure hypercholesterolemia, unspecified: Secondary | ICD-10-CM | POA: Diagnosis not present

## 2022-07-27 DIAGNOSIS — Z4789 Encounter for other orthopedic aftercare: Secondary | ICD-10-CM | POA: Diagnosis not present

## 2022-07-27 DIAGNOSIS — E119 Type 2 diabetes mellitus without complications: Secondary | ICD-10-CM | POA: Diagnosis not present

## 2022-07-27 DIAGNOSIS — Z7985 Long-term (current) use of injectable non-insulin antidiabetic drugs: Secondary | ICD-10-CM | POA: Diagnosis not present

## 2022-07-27 DIAGNOSIS — Z7984 Long term (current) use of oral hypoglycemic drugs: Secondary | ICD-10-CM | POA: Diagnosis not present

## 2022-07-27 DIAGNOSIS — Z794 Long term (current) use of insulin: Secondary | ICD-10-CM | POA: Diagnosis not present

## 2022-07-27 DIAGNOSIS — G4730 Sleep apnea, unspecified: Secondary | ICD-10-CM | POA: Diagnosis not present

## 2022-08-07 ENCOUNTER — Other Ambulatory Visit: Payer: Self-pay | Admitting: Internal Medicine

## 2022-08-27 DIAGNOSIS — M5416 Radiculopathy, lumbar region: Secondary | ICD-10-CM | POA: Diagnosis not present

## 2022-08-27 DIAGNOSIS — M5451 Vertebrogenic low back pain: Secondary | ICD-10-CM | POA: Diagnosis not present

## 2022-08-28 DIAGNOSIS — Z4889 Encounter for other specified surgical aftercare: Secondary | ICD-10-CM | POA: Diagnosis not present

## 2022-09-04 DIAGNOSIS — M5451 Vertebrogenic low back pain: Secondary | ICD-10-CM | POA: Diagnosis not present

## 2022-09-04 DIAGNOSIS — M5416 Radiculopathy, lumbar region: Secondary | ICD-10-CM | POA: Diagnosis not present

## 2022-09-06 DIAGNOSIS — H3589 Other specified retinal disorders: Secondary | ICD-10-CM | POA: Diagnosis not present

## 2022-09-06 DIAGNOSIS — H43392 Other vitreous opacities, left eye: Secondary | ICD-10-CM | POA: Diagnosis not present

## 2022-09-06 DIAGNOSIS — H35033 Hypertensive retinopathy, bilateral: Secondary | ICD-10-CM | POA: Diagnosis not present

## 2022-09-06 DIAGNOSIS — M5416 Radiculopathy, lumbar region: Secondary | ICD-10-CM | POA: Diagnosis not present

## 2022-09-06 DIAGNOSIS — E113493 Type 2 diabetes mellitus with severe nonproliferative diabetic retinopathy without macular edema, bilateral: Secondary | ICD-10-CM | POA: Diagnosis not present

## 2022-09-06 DIAGNOSIS — G4733 Obstructive sleep apnea (adult) (pediatric): Secondary | ICD-10-CM | POA: Diagnosis not present

## 2022-09-06 DIAGNOSIS — M5451 Vertebrogenic low back pain: Secondary | ICD-10-CM | POA: Diagnosis not present

## 2022-09-06 DIAGNOSIS — H35071 Retinal telangiectasis, right eye: Secondary | ICD-10-CM | POA: Diagnosis not present

## 2022-09-10 ENCOUNTER — Other Ambulatory Visit: Payer: Self-pay

## 2022-09-10 DIAGNOSIS — M5451 Vertebrogenic low back pain: Secondary | ICD-10-CM | POA: Diagnosis not present

## 2022-09-10 DIAGNOSIS — Z794 Long term (current) use of insulin: Secondary | ICD-10-CM

## 2022-09-10 DIAGNOSIS — M5416 Radiculopathy, lumbar region: Secondary | ICD-10-CM | POA: Diagnosis not present

## 2022-09-10 DIAGNOSIS — E119 Type 2 diabetes mellitus without complications: Secondary | ICD-10-CM

## 2022-09-10 MED ORDER — ATORVASTATIN CALCIUM 20 MG PO TABS: ORAL_TABLET | ORAL | 1 refills | Status: DC

## 2022-09-10 MED ORDER — LISINOPRIL 10 MG PO TABS: 10.0000 mg | ORAL_TABLET | Freq: Every morning | ORAL | 1 refills | Status: DC

## 2022-09-10 MED ORDER — METFORMIN HCL ER 750 MG PO TB24: 750.0000 mg | ORAL_TABLET | Freq: Every day | ORAL | 1 refills | Status: DC

## 2022-09-10 MED ORDER — TRESIBA FLEXTOUCH 100 UNIT/ML ~~LOC~~ SOPN: PEN_INJECTOR | SUBCUTANEOUS | 2 refills | Status: DC

## 2022-09-12 DIAGNOSIS — M5416 Radiculopathy, lumbar region: Secondary | ICD-10-CM | POA: Diagnosis not present

## 2022-09-12 DIAGNOSIS — M5451 Vertebrogenic low back pain: Secondary | ICD-10-CM | POA: Diagnosis not present

## 2022-09-13 DIAGNOSIS — M47816 Spondylosis without myelopathy or radiculopathy, lumbar region: Secondary | ICD-10-CM | POA: Diagnosis not present

## 2022-09-17 DIAGNOSIS — M5451 Vertebrogenic low back pain: Secondary | ICD-10-CM | POA: Diagnosis not present

## 2022-09-17 DIAGNOSIS — M5416 Radiculopathy, lumbar region: Secondary | ICD-10-CM | POA: Diagnosis not present

## 2022-09-19 DIAGNOSIS — M5451 Vertebrogenic low back pain: Secondary | ICD-10-CM | POA: Diagnosis not present

## 2022-09-19 DIAGNOSIS — M5416 Radiculopathy, lumbar region: Secondary | ICD-10-CM | POA: Diagnosis not present

## 2022-09-25 DIAGNOSIS — M5451 Vertebrogenic low back pain: Secondary | ICD-10-CM | POA: Diagnosis not present

## 2022-09-25 DIAGNOSIS — M5416 Radiculopathy, lumbar region: Secondary | ICD-10-CM | POA: Diagnosis not present

## 2022-09-26 DIAGNOSIS — M542 Cervicalgia: Secondary | ICD-10-CM | POA: Diagnosis not present

## 2022-09-26 DIAGNOSIS — Z4889 Encounter for other specified surgical aftercare: Secondary | ICD-10-CM | POA: Diagnosis not present

## 2022-09-27 DIAGNOSIS — M5451 Vertebrogenic low back pain: Secondary | ICD-10-CM | POA: Diagnosis not present

## 2022-09-27 DIAGNOSIS — M5416 Radiculopathy, lumbar region: Secondary | ICD-10-CM | POA: Diagnosis not present

## 2022-10-01 ENCOUNTER — Ambulatory Visit (INDEPENDENT_AMBULATORY_CARE_PROVIDER_SITE_OTHER): Payer: PPO | Admitting: Nurse Practitioner

## 2022-10-01 ENCOUNTER — Encounter: Payer: Self-pay | Admitting: Nurse Practitioner

## 2022-10-01 VITALS — BP 118/58 | HR 86 | Temp 97.9°F | Ht 63.0 in | Wt 150.0 lb

## 2022-10-01 DIAGNOSIS — R141 Gas pain: Secondary | ICD-10-CM | POA: Insufficient documentation

## 2022-10-01 DIAGNOSIS — Z794 Long term (current) use of insulin: Secondary | ICD-10-CM

## 2022-10-01 DIAGNOSIS — Z981 Arthrodesis status: Secondary | ICD-10-CM | POA: Diagnosis not present

## 2022-10-01 DIAGNOSIS — K5900 Constipation, unspecified: Secondary | ICD-10-CM | POA: Insufficient documentation

## 2022-10-01 DIAGNOSIS — Z2821 Immunization not carried out because of patient refusal: Secondary | ICD-10-CM | POA: Diagnosis not present

## 2022-10-01 DIAGNOSIS — E113493 Type 2 diabetes mellitus with severe nonproliferative diabetic retinopathy without macular edema, bilateral: Secondary | ICD-10-CM | POA: Diagnosis not present

## 2022-10-01 DIAGNOSIS — M545 Low back pain, unspecified: Secondary | ICD-10-CM | POA: Insufficient documentation

## 2022-10-01 DIAGNOSIS — M542 Cervicalgia: Secondary | ICD-10-CM | POA: Diagnosis not present

## 2022-10-01 DIAGNOSIS — R1031 Right lower quadrant pain: Secondary | ICD-10-CM | POA: Insufficient documentation

## 2022-10-01 MED ORDER — TRESIBA FLEXTOUCH 100 UNIT/ML ~~LOC~~ SOPN: PEN_INJECTOR | SUBCUTANEOUS | 2 refills | Status: DC

## 2022-10-01 NOTE — Patient Instructions (Signed)
Send a mychart message or call office to provide your blood sugar readings on Mondays and Thursdays.

## 2022-10-01 NOTE — Progress Notes (Signed)
I,Shannon [name],acting as a Neurosurgeonscribe for Shannon FeltsJanece Corrin Hingle, FNP.,have documented all relevant documentation on the behalf of Shannon FeltsJanece Verbie Babic, FNP,as directed by  Shannon FeltsJanece Keyaria Lawson, FNP while in the presence of Shannon FeltsJanece Arth Nicastro, FNP.    Subjective:     Patient ID: Shannon [name] , female    DOB: 1955/07/25 , 67 y.o.   MRN: [id number]   Chief Complaint  Patient presents with   Medical Management of Chronic Issues    HPI  Patient presents today for DM follow up.  Her Ozempic is now being covered at the pharmacy.  She had L4-L5 fusion in January.   Diabetes She presents for her follow-up diabetic visit. She has type 2 diabetes mellitus. Her disease course has been stable. Pertinent negatives for hypoglycemia include no confusion, dizziness or nervousness/anxiousness. Pertinent negatives for diabetes include no fatigue, no polydipsia, no polyphagia and no polyuria. There are no hypoglycemic complications. Symptoms are stable. There are no diabetic complications. Risk factors for coronary artery disease include diabetes mellitus, hypertension and obesity. Current diabetic treatment includes oral agent (dual therapy). She is compliant with treatment all of the time (she has only been taking 0.25 mg weekly, trying to spread out her medication when she did not have insurance). She is following a generally healthy diet. When asked about meal planning, she reported none. She has not had a previous visit with a dietitian. She participates in exercise every other day. Her home blood glucose trend is increasing steadily. Her overall blood glucose range is 140-180 mg/dl. (She wears the Shannon [name].   Blood sugar 120-270 since her surgery in January. ) An ACE inhibitor/angiotensin II receptor blocker is being taken. She does not see a podiatrist.Eye exam current: not done at this time, needs a new opthamologist.     Past Medical History:  Diagnosis Date   COVID-19 virus infection 10/13/2018   Diabetes mellitus without  complication    Hypercholesteremia    Sleep apnea    Newly diagnosed in September 2021     Family History  Problem Relation Age of Onset   Hypertension Mother    Diabetes Mother    Stroke Mother    Diabetes Father    Hypertension Father    Stroke Father    Hypertension Son    Diabetes Son    Breast cancer Neg Hx      Current Outpatient Medications:    atorvastatin (LIPITOR) 20 MG tablet, TAKE ONE TABLET BY MOUTH EVERY EVENING with A meal, Disp: 90 tablet, Rfl: 1   Blood Glucose Monitoring Suppl (ONE TOUCH ULTRA MINI) w/Device KIT, 1 each by Does not apply route 4 (four) times daily -  before meals and at bedtime., Disp: 1 kit, Rfl: 0   Cholecalciferol (VITAMIN D-3 PO), Take 2,000 Units by mouth every evening., Disp: , Rfl:    Continuous Blood Gluc Receiver (FREESTYLE LIBRE 14 DAY READER) DEVI, USE TO CHECK BLOOD SUGARS FOUR TIMES A DAY DX:E11.65, Disp: 1 each, Rfl: 1   Continuous Blood Gluc Sensor (FREESTYLE LIBRE 2 SENSOR) MISC, USE TO check blood glucose AS DIRECTED AND CHANGE sensors every 14 DAYS, Disp: 3 each, Rfl: 1   famotidine (PEPCID) 20 MG tablet, Take 1 tablet (20 mg total) by mouth every morning., Disp: 90 tablet, Rfl: 1   glucagon (GLUCAGEN HYPOKIT) 1 MG SOLR injection, Inject 1 mg into the vein once as needed for up to 1 dose for low blood sugar., Disp: 1 each, Rfl: 5   lisinopril (ZESTRIL) 10 MG  tablet, Take 1 tablet (10 mg total) by mouth every morning., Disp: 90 tablet, Rfl: 1   metFORMIN (GLUCOPHAGE XR) 750 MG 24 hr tablet, Take 1 tablet (750 mg total) by mouth daily with breakfast., Disp: 90 tablet, Rfl: 1   ondansetron (ZOFRAN) 4 MG tablet, Take 1 tablet (4 mg total) by mouth every 8 (eight) hours as needed for nausea or vomiting., Disp: 20 tablet, Rfl: 0   Semaglutide, 1 MG/DOSE, 4 MG/3ML SOPN, Inject 1 mg into the skin once a week., Disp: 9 mL, Rfl: 1   insulin degludec (TRESIBA FLEXTOUCH) 100 UNIT/ML FlexTouch Pen, inject 20 units AT bedtime, Disp: 15 mL, Rfl: 2    No Known Allergies   Review of Systems  Constitutional: Negative.  Negative for fatigue.  Respiratory: Negative.    Cardiovascular: Negative.   Endocrine: Negative for polydipsia, polyphagia and polyuria.  Neurological:  Negative for dizziness.  Psychiatric/Behavioral:  Negative for confusion. The patient is not nervous/anxious.      Today's Vitals   10/01/22 1027  BP: (!) 118/58  Pulse: 86  Temp: 97.9 F (36.6 C)  TempSrc: Oral  SpO2: 98%  Weight: 150 lb (68 kg)  Height: 5\' 3"  (1.6 m)   Body mass index is 26.57 kg/m.   Objective:  Physical Exam Vitals reviewed.  Constitutional:      General: She is not in acute distress.    Appearance: Normal appearance. She is well-developed.  Cardiovascular:     Rate and Rhythm: Normal rate and regular rhythm.     Pulses: Normal pulses.     Heart sounds: Normal heart sounds. No murmur heard. Pulmonary:     Effort: Pulmonary effort is normal. No respiratory distress.     Breath sounds: Normal breath sounds. No wheezing.  Chest:     Chest wall: No tenderness.  Musculoskeletal:        General: Normal range of motion.  Skin:    General: Skin is warm and dry.     Capillary Refill: Capillary refill takes less than 2 seconds.  Neurological:     General: No focal deficit present.     Mental Status: She is alert and oriented to person, place, and time.  Psychiatric:        Mood and Affect: Mood normal.        Behavior: Behavior normal.        Thought Content: Thought content normal.        Judgment: Judgment normal.         Assessment And Plan:     1. 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 - Hemoglobin A1c - insulin degludec (TRESIBA FLEXTOUCH) 100  UNIT/ML FlexTouch Pen; inject 20 units AT bedtime  Dispense: 15 mL; Refill: 2 - Lipid panel  2. Cervicalgia Comments: She is being seen by Neurosurgery, she does have some "stiffness"  3. COVID-19 vaccination declined Comments: She would like to wait until she is feeling better from her neck pain  4. S/P lumbar fusion Comments: Continue f/u with Neurosurgery     Patient was given opportunity to ask questions. Patient verbalized understanding of the plan and was able to repeat key elements of the plan. All questions were answered to their satisfaction.  Shannon Felts,  FNP   I, Shannon Felts, FNP, have reviewed all documentation for this visit. The documentation on 10/01/22 for the exam, diagnosis, procedures, and orders are all accurate and complete.   IF YOU HAVE BEEN REFERRED TO A SPECIALIST, IT MAY TAKE 1-2 WEEKS TO SCHEDULE/PROCESS THE REFERRAL. IF YOU HAVE NOT HEARD FROM US/SPECIALIST IN TWO WEEKS, PLEASE GIVE [place] A CALL AT [phone].   THE PATIENT IS ENCOURAGED TO PRACTICE SOCIAL DISTANCING DUE TO THE COVID-19 PANDEMIC.

## 2022-10-02 DIAGNOSIS — M542 Cervicalgia: Secondary | ICD-10-CM | POA: Diagnosis not present

## 2022-10-02 LAB — LIPID PANEL
Chol/HDL Ratio: 3.1 ratio (ref 0.0–4.4)
Cholesterol, Total: 116 mg/dL (ref 100–199)
HDL: 38 mg/dL — ABNORMAL LOW (ref >39)
LDL Chol Calc (NIH): 51 mg/dL (ref 0–99)
Triglycerides: 161 mg/dL — ABNORMAL HIGH (ref 0–149)
VLDL Cholesterol Cal: 27 mg/dL (ref 5–40)

## 2022-10-02 LAB — HEMOGLOBIN A1C
Est. average glucose Bld gHb Est-mCnc: 194 mg/dL
Hgb A1c MFr Bld: 8.4 % — ABNORMAL HIGH (ref 4.8–5.6)

## 2022-10-08 DIAGNOSIS — M5416 Radiculopathy, lumbar region: Secondary | ICD-10-CM | POA: Diagnosis not present

## 2022-10-08 DIAGNOSIS — M5451 Vertebrogenic low back pain: Secondary | ICD-10-CM | POA: Diagnosis not present

## 2022-10-08 DIAGNOSIS — M542 Cervicalgia: Secondary | ICD-10-CM | POA: Diagnosis not present

## 2022-10-10 DIAGNOSIS — G4733 Obstructive sleep apnea (adult) (pediatric): Secondary | ICD-10-CM | POA: Diagnosis not present

## 2022-10-16 DIAGNOSIS — M542 Cervicalgia: Secondary | ICD-10-CM | POA: Diagnosis not present

## 2022-10-16 DIAGNOSIS — M5416 Radiculopathy, lumbar region: Secondary | ICD-10-CM | POA: Diagnosis not present

## 2022-10-16 DIAGNOSIS — M5451 Vertebrogenic low back pain: Secondary | ICD-10-CM | POA: Diagnosis not present

## 2022-10-18 DIAGNOSIS — M542 Cervicalgia: Secondary | ICD-10-CM | POA: Diagnosis not present

## 2022-10-18 DIAGNOSIS — M5451 Vertebrogenic low back pain: Secondary | ICD-10-CM | POA: Diagnosis not present

## 2022-10-18 DIAGNOSIS — M5416 Radiculopathy, lumbar region: Secondary | ICD-10-CM | POA: Diagnosis not present

## 2022-10-22 DIAGNOSIS — M542 Cervicalgia: Secondary | ICD-10-CM | POA: Diagnosis not present

## 2022-10-22 DIAGNOSIS — M5451 Vertebrogenic low back pain: Secondary | ICD-10-CM | POA: Diagnosis not present

## 2022-10-22 DIAGNOSIS — M5416 Radiculopathy, lumbar region: Secondary | ICD-10-CM | POA: Diagnosis not present

## 2022-10-25 DIAGNOSIS — M5416 Radiculopathy, lumbar region: Secondary | ICD-10-CM | POA: Diagnosis not present

## 2022-10-25 DIAGNOSIS — M542 Cervicalgia: Secondary | ICD-10-CM | POA: Diagnosis not present

## 2022-10-25 DIAGNOSIS — M5451 Vertebrogenic low back pain: Secondary | ICD-10-CM | POA: Diagnosis not present

## 2022-10-29 DIAGNOSIS — M542 Cervicalgia: Secondary | ICD-10-CM | POA: Diagnosis not present

## 2022-10-29 DIAGNOSIS — M5451 Vertebrogenic low back pain: Secondary | ICD-10-CM | POA: Diagnosis not present

## 2022-10-29 DIAGNOSIS — M5416 Radiculopathy, lumbar region: Secondary | ICD-10-CM | POA: Diagnosis not present

## 2022-11-01 DIAGNOSIS — M542 Cervicalgia: Secondary | ICD-10-CM | POA: Diagnosis not present

## 2022-11-01 DIAGNOSIS — M5451 Vertebrogenic low back pain: Secondary | ICD-10-CM | POA: Diagnosis not present

## 2022-11-01 DIAGNOSIS — M5416 Radiculopathy, lumbar region: Secondary | ICD-10-CM | POA: Diagnosis not present

## 2022-11-05 DIAGNOSIS — M542 Cervicalgia: Secondary | ICD-10-CM | POA: Diagnosis not present

## 2022-11-05 DIAGNOSIS — M5416 Radiculopathy, lumbar region: Secondary | ICD-10-CM | POA: Diagnosis not present

## 2022-11-05 DIAGNOSIS — M5451 Vertebrogenic low back pain: Secondary | ICD-10-CM | POA: Diagnosis not present

## 2022-11-07 ENCOUNTER — Other Ambulatory Visit: Payer: Self-pay | Admitting: Nurse Practitioner

## 2022-11-07 DIAGNOSIS — M5416 Radiculopathy, lumbar region: Secondary | ICD-10-CM

## 2022-11-08 DIAGNOSIS — M542 Cervicalgia: Secondary | ICD-10-CM | POA: Diagnosis not present

## 2022-11-08 DIAGNOSIS — M5451 Vertebrogenic low back pain: Secondary | ICD-10-CM | POA: Diagnosis not present

## 2022-11-08 DIAGNOSIS — M5416 Radiculopathy, lumbar region: Secondary | ICD-10-CM | POA: Diagnosis not present

## 2022-11-20 DIAGNOSIS — M5416 Radiculopathy, lumbar region: Secondary | ICD-10-CM | POA: Diagnosis not present

## 2022-11-20 DIAGNOSIS — M5451 Vertebrogenic low back pain: Secondary | ICD-10-CM | POA: Diagnosis not present

## 2022-11-20 DIAGNOSIS — M542 Cervicalgia: Secondary | ICD-10-CM | POA: Diagnosis not present

## 2022-11-21 ENCOUNTER — Other Ambulatory Visit: Payer: Self-pay | Admitting: Nurse Practitioner

## 2022-12-05 ENCOUNTER — Ambulatory Visit (INDEPENDENT_AMBULATORY_CARE_PROVIDER_SITE_OTHER): Payer: PPO

## 2022-12-05 VITALS — Ht 63.0 in | Wt 140.0 lb

## 2022-12-05 DIAGNOSIS — Z0000 Encounter for general adult medical examination without abnormal findings: Secondary | ICD-10-CM | POA: Diagnosis not present

## 2022-12-05 NOTE — Progress Notes (Signed)
I connected with  Shannon [name] on 12/05/22 by a audio enabled telemedicine application and verified that I am speaking with the correct person using two identifiers.  Patient Location: Home  Provider Location: Office/Clinic  I discussed the limitations of evaluation and management by telemedicine. The patient expressed understanding and agreed to proceed. Subjective:   Shannon [name] is a 67 y.o. female who presents for Medicare Annual (Subsequent) preventive examination.  Review of Systems     Cardiac Risk Factors include: advanced age (>33men, >9 women);diabetes mellitus     Objective:    Today's Vitals   12/05/22 0843  Weight: 140 lb (63.5 kg)  Height: 5\' 3"  (1.6 m)   Body mass index is 24.8 kg/m.     12/05/2022    8:47 AM 07/16/2022    9:17 AM 07/12/2022    8:12 AM 11/30/2021    8:32 AM 11/17/2020    8:42 AM 11/16/2019    3:08 PM 07/23/2019    3:33 PM  Advanced Directives  Does Patient Have a Medical Advance Directive? No No No Yes Yes No No  Type of [employer] [place];Living will    Copy of Healthcare Power of Attorney in Chart?    No - copy requested No - copy requested    Would patient like information on creating a medical advance directive?   No - Patient declined   Yes (MAU/Ambulatory/Procedural Areas - Information given) No - Patient declined    Current Medications (verified) Outpatient Encounter Medications as of 12/05/2022  Medication Sig   atorvastatin (LIPITOR) 20 MG tablet TAKE ONE TABLET BY MOUTH EVERY EVENING with A meal   Blood Glucose Monitoring Suppl (ONE TOUCH ULTRA MINI) w/Device KIT 1 each by Does not apply route 4 (four) times daily -  before meals and at bedtime.   Cholecalciferol (VITAMIN D-3 PO) Take 2,000 Units by mouth every evening.   Continuous Blood Gluc Receiver (FREESTYLE LIBRE 14 DAY READER) DEVI USE TO CHECK BLOOD SUGARS FOUR TIMES A DAY DX:E11.65   Continuous Glucose Sensor  (FREESTYLE LIBRE 2 SENSOR) MISC Apply new sensor every 14 days to monitor blood glucose. Remove old sensor before applying new one.   glucagon (GLUCAGEN HYPOKIT) 1 MG SOLR injection Inject 1 mg into the vein once as needed for up to 1 dose for low blood sugar.   insulin degludec (TRESIBA FLEXTOUCH) 100 UNIT/ML FlexTouch Pen inject 20 units AT bedtime   lisinopril (ZESTRIL) 10 MG tablet Take 1 tablet (10 mg total) by mouth every morning.   metFORMIN (GLUCOPHAGE XR) 750 MG 24 hr tablet Take 1 tablet (750 mg total) by mouth daily with breakfast.   Semaglutide, 1 MG/DOSE, 4 MG/3ML SOPN Inject 1 mg into the skin once a week.   famotidine (PEPCID) 20 MG tablet Take 1 tablet (20 mg total) by mouth every morning. (Patient not taking: Reported on 12/05/2022)   ondansetron (ZOFRAN) 4 MG tablet Take 1 tablet (4 mg total) by mouth every 8 (eight) hours as needed for nausea or vomiting. (Patient not taking: Reported on 12/05/2022)   No facility-administered encounter medications on file as of 12/05/2022.    Allergies (verified) Patient has no known allergies.   History: Past Medical History:  Diagnosis Date   COVID-19 virus infection 10/13/2018   Diabetes mellitus without complication (HCC)    Hypercholesteremia    Sleep apnea    Newly diagnosed in September 2021   Past Surgical  History:  Procedure Laterality Date   ABDOMINAL HYSTERECTOMY     APPENDECTOMY  1981   CATARACT EXTRACTION Bilateral    CESAREAN SECTION     MYOMECTOMY     TRANSFORAMINAL LUMBAR INTERBODY FUSION (TLIF) WITH PEDICLE SCREW FIXATION 1 LEVEL N/A 07/16/2022   Procedure: TRANSFORAMINAL LUMBAR INTERBODY FUSION (TLIF L4-5);  Surgeon: Venita Lick, MD;  Location: [facility] OR;  Service: Orthopedics;  Laterality: N/A;   Family History  Problem Relation Age of Onset   Hypertension Mother    Diabetes Mother    Stroke Mother    Diabetes Father    Hypertension Father    Stroke Father    Hypertension Son    Diabetes Son    Breast  cancer Neg Hx    Social History   Socioeconomic History   Marital status: Single    Spouse name: Not on file   Number of children: Not on file   Years of education: Not on file   Highest education level: Not on file  Occupational History   Not on file  Tobacco Use   Smoking status: Former    Types: Cigarettes    Quit date: 2006    Years since quitting: 18.4   Smokeless tobacco: Never   Tobacco comments:    quit 10 yrs  Vaping Use   Vaping Use: Never used  Substance and Sexual Activity   Alcohol use: Yes    Comment: 1 glass of wine sometimes daily    Drug use: Never   Sexual activity: Not on file  Other Topics Concern   Not on file  Social History Narrative   Not on file   Social Determinants of Health   Financial Resource Strain: Low Risk  (12/05/2022)   Overall Financial Resource Strain (CARDIA)    Difficulty of Paying Living Expenses: Not hard at all  Food Insecurity: No Food Insecurity (12/05/2022)   Hunger Vital Sign    Worried About Running Out of Food in the Last Year: Never true    Ran Out of Food in the Last Year: Never true  Transportation Needs: No Transportation Needs (12/05/2022)   PRAPARE - Administrator, Civil Service (Medical): No    Lack of Transportation (Non-Medical): No  Physical Activity: Sufficiently Active (12/05/2022)   Exercise Vital Sign    Days of Exercise per Week: 3 days    Minutes of Exercise per Session: 50 min  Stress: No Stress Concern Present (12/05/2022)   [employer] of Occupational Health - Occupational Stress Questionnaire    Feeling of Stress : Not at all  Social Connections: Not on file    Tobacco Counseling Counseling given: Not Answered Tobacco comments: quit 10 yrs   Clinical Intake:  Pre-visit preparation completed: Yes  Pain : No/denies pain     Nutritional Status: BMI of 19-24  Normal Nutritional Risks: None Diabetes: Yes  How often do you need to have someone help you when you read  instructions, pamphlets, or other written materials from your doctor or pharmacy?: 1 - Never  Diabetic? Yes Nutrition Risk Assessment:  Has the patient had any N/V/D within the last 2 months?  No  Does the patient have any non-healing wounds?  No  Has the patient had any unintentional weight loss or weight gain?  No   Diabetes:  Is the patient diabetic?  Yes  If diabetic, was a CBG obtained today?  No  Did the patient bring in their glucometer from home?  No  How often do you monitor your CBG's? daily.   Financial Strains and Diabetes Management:  Are you having any financial strains with the device, your supplies or your medication? No .  Does the patient want to be seen by Chronic Care Management for management of their diabetes?  No  Would the patient like to be referred to a Nutritionist or for Diabetic Management?  No   Diabetic Exams:  Diabetic Eye Exam: Completed 12/13/2021 Diabetic Foot Exam: Completed 01/23/2022  Interpreter Needed?: No  Information entered by :: NAllen LPN   Activities of Daily Living    12/05/2022    8:47 AM 07/12/2022    8:15 AM  In your present state of health, do you have any difficulty performing the following activities:  Hearing? 0   Vision? 0   Difficulty concentrating or making decisions? 0   Walking or climbing stairs? 0   Dressing or bathing? 0   Doing errands, shopping? 0 0  Preparing Food and eating ? N   Using the Toilet? N   In the past six months, have you accidently leaked urine? N   Do you have problems with loss of bowel control? N   Managing your Medications? N   Managing your Finances? N   Housekeeping or managing your Housekeeping? N     Patient Care Team: Arnette Felts, FNP as PCP - General (General Practice) Harlan Stains, [facility] (Pharmacist)  Indicate any recent Medical Services you may have received from other than [facility] providers in the past year (date may be approximate).     Assessment:   This is a routine  wellness examination for Chaunice.  Hearing/Vision screen Vision Screening - Comments:: Regular eye exams, Dr. Luciana Axe  Dietary issues and exercise activities discussed: Current Exercise Habits: Structured exercise class, Type of exercise: Other - see comments (water aerobics), Time (Minutes): 45, Frequency (Times/Week): 3, Weekly Exercise (Minutes/Week): 135   Goals Addressed             This Visit's Progress    Patient Stated       12/05/2022, stay healthy       Depression Screen    12/05/2022    8:47 AM 10/01/2022   10:26 AM 05/29/2022   10:43 AM 11/30/2021    8:33 AM 11/21/2021   11:08 AM 11/17/2020    8:43 AM 04/20/2020   10:07 AM  PHQ 2/9 Scores  PHQ - 2 Score 0 0 0 0 0 0 0  PHQ- 9 Score     0      Fall Risk    12/05/2022    8:47 AM 10/01/2022   10:26 AM 05/29/2022   10:43 AM 11/30/2021    8:33 AM 11/21/2021   11:08 AM  Fall Risk   Falls in the past year? 0 0 0 0 0  Number falls in past yr: 0  0 0 0  Injury with Fall? 0  0 0 0  Risk for fall due to : Medication side effect  No Fall Risks Medication side effect No Fall Risks  Follow up Falls prevention discussed;Education provided;Falls evaluation completed  Falls evaluation completed Falls evaluation completed;Education provided;Falls prevention discussed Falls evaluation completed    FALL RISK PREVENTION PERTAINING TO THE HOME:  Any stairs in or around the home? No  If so, are there any without handrails? N/a Home free of loose throw rugs in walkways, pet beds, electrical cords, etc? Yes  Adequate lighting in your home to reduce risk  of falls? Yes   ASSISTIVE DEVICES UTILIZED TO PREVENT FALLS:  Life alert? No  Use of a cane, walker or w/c? No  Grab bars in the bathroom? No  Shower chair or bench in shower? Yes  Elevated toilet seat or a handicapped toilet? No   TIMED UP AND GO:  Was the test performed? No .      Cognitive Function:        12/05/2022    8:48 AM 11/30/2021    8:34 AM 11/17/2020    8:44 AM  11/16/2019    2:43 PM  6CIT Screen  What Year? 0 points 0 points 0 points 0 points  What month? 0 points 0 points 0 points 0 points  What time? 0 points 0 points 0 points 0 points  Count back from 20 0 points 0 points 0 points 0 points  Months in reverse 0 points 0 points 0 points 0 points  Repeat phrase 0 points 4 points 2 points 0 points  Total Score 0 points 4 points 2 points 0 points    Immunizations Immunization History  Administered Date(s) Administered   Fluad Quad(high Dose 65+) 03/22/2020, 06/22/2021, 05/29/2022   Influenza,inj,Quad PF,6+ Mos 04/18/2018   Influenza-Unspecified 05/01/2019, 04/24/2022   PFIZER Comirnaty(Gray Top)Covid-19 Tri-Sucrose Vaccine 01/11/2021   PFIZER(Purple Top)SARS-COV-2 Vaccination 09/18/2019, 10/14/2019, 04/19/2020, 01/11/2021   PNEUMOCOCCAL CONJUGATE-20 09/28/2021   Pfizer Covid-19 Vaccine Bivalent Booster 10yrs & up 10/23/2021   Pneumococcal Polysaccharide-23 05/30/2018   Tdap 11/28/2017   Zoster Recombinat (Shingrix) 02/22/2021, 08/15/2021    TDAP status: Up to date  Flu Vaccine status: Up to date  Pneumococcal vaccine status: Up to date  Covid-19 vaccine status: Completed vaccines  Qualifies for Shingles Vaccine? Yes   Zostavax completed Yes   Shingrix Completed?: Yes  Screening Tests Health Maintenance  Topic Date Due   COVID-19 Vaccine (6 - 2023-24 season) 02/23/2022   Medicare Annual Wellness (AWV)  12/01/2022   OPHTHALMOLOGY EXAM  12/14/2022   Diabetic kidney evaluation - Urine ACR  01/24/2023   INFLUENZA VACCINE  01/24/2023   FOOT EXAM  01/24/2023   HEMOGLOBIN A1C  04/02/2023   Colonoscopy  04/11/2023   Diabetic kidney evaluation - eGFR measurement  07/13/2023   MAMMOGRAM  01/16/2024   DTaP/Tdap/Td (2 - Td or Tdap) 11/29/2027   Pneumonia Vaccine 90+ Years old  Completed   DEXA SCAN  Completed   Hepatitis C Screening  Completed   Zoster Vaccines- Shingrix  Completed   HPV VACCINES  Aged Out    Health  Maintenance  Health Maintenance Due  Topic Date Due   COVID-19 Vaccine (6 - 2023-24 season) 02/23/2022   Medicare Annual Wellness (AWV)  12/01/2022    Colorectal cancer screening: Type of screening: Colonoscopy. Completed 04/10/2013. Repeat every 10 years  Mammogram status: Completed 01/15/2022. Repeat every year  Bone Density status: Completed 12/09/2019  Lung Cancer Screening: (Low Dose CT Chest recommended if Age 83-80 years, 30 pack-year currently smoking OR have quit w/in 15years.) does not qualify.   Lung Cancer Screening Referral: no  Additional Screening:  Hepatitis C Screening: does qualify; Completed 02/16/2020  Vision Screening: Recommended annual ophthalmology exams for early detection of glaucoma and other disorders of the eye. Is the patient up to date with their annual eye exam?  Yes  Who is the provider or what is the name of the office in which the patient attends annual eye exams? Dr. Luciana Axe If pt is not established with a provider, would they like  to be referred to a provider to establish care? No .   Dental Screening: Recommended annual dental exams for proper oral hygiene  Community Resource Referral / Chronic Care Management: CRR required this visit?  No   CCM required this visit?  No      Plan:     I have personally reviewed and noted the following in the patient's chart:   Medical and social history Use of alcohol, tobacco or illicit drugs  Current medications and supplements including opioid prescriptions. Patient is not currently taking opioid prescriptions. Functional ability and status Nutritional status Physical activity Advanced directives List of other physicians Hospitalizations, surgeries, and ER visits in previous 12 months Vitals Screenings to include cognitive, depression, and falls Referrals and appointments  In addition, I have reviewed and discussed with patient certain preventive protocols, quality metrics, and best practice  recommendations. A written personalized care plan for preventive services as well as general preventive health recommendations were provided to patient.     Barb Merino, LPN   [id number]   Nurse Notes: none  Due to this being a virtual visit, the after visit summary with patients personalized plan was offered to patient via mail or my-chart. Patient would like to access on my-chart

## 2022-12-05 NOTE — Patient Instructions (Signed)
Shannon [name] , Thank you for taking time to come for your Medicare Wellness Visit. I appreciate your ongoing commitment to your health goals. Please review the following plan we discussed and let me know if I can assist you in the future.   These are the goals we discussed:  Goals      DIET - EAT MORE FRUITS AND VEGETABLES     "I plan to eat right and stay active"     Monitor and Manage My Blood Sugar-Diabetes Type 2     Timeframe:  Long-Range Goal Priority:  Medium Start Date:                             Expected End Date:                       Follow Up Date 01/16/2022:   In Progress:   - check blood sugar before and after exercise - check blood sugar if I feel it is too high or too low    Why is this important?   Checking your blood sugar at home helps to keep it from getting very high or very low.  Writing the results in a diary or log helps the doctor know how to care for you.  Your blood sugar log should have the time, date and the results.  Also, write down the amount of insulin or other medicine that you take.  Other information, like what you ate, exercise done and how you were feeling, will also be helpful.     Notes:  Please call with any questions that you might have     Ozempic Patient Assistance     CARE PLAN ENTRY (see longitudinal plan of care for additional care plan information)  Current Barriers:  Financial Barriers: patient has [employer] insurance and reports copay for Ozempic is cost prohibitive at this time  Pharmacist Clinical Goal(s):  Over the next 30 days, patient will work with PharmD and providers to relieve medication access concerns  Interventions: Comprehensive medication review completed; medication list updated in electronic medical record.  Inter-disciplinary care team collaboration (see longitudinal plan of care) Ozempic by [employer]: Patient meets income criteria for this medication's patient assistance program. Reviewed  application process. Patient provided proof of income, and signed application. Primary care provider Arnette Felts, FNP completed their portion of application. Complete application faxed to [employer] on 01/21/20  Patient Self Care Activities:  Patient will use samples until medication can be obtained through [employer]  Initial goal documentation      Patient Stated     11/17/2020, keep A1C under control     Patient Stated     11/30/2021, get A1c under control and control arthritis     Patient Stated     12/05/2022, stay healthy     Pharmacy Care Plan     CARE PLAN ENTRY (see longitudinal plan of care for additional care plan information)  Current Barriers:  Chronic Disease Management support, education, and care coordination needs related to Hypertension, Hyperlipidemia, Diabetes, and Osteopenia   Hypertension BP Readings from Last 3 Encounters:  01/06/20 112/60  12/15/19 130/72  11/16/19 130/86  Pharmacist Clinical Goal(s): Over the next 180 days, patient will work with PharmD and providers to maintain BP goal <130/80 Current regimen:  Lisinopril 10mg  daily Interventions: Provided dietary and exercise recommendations Patient self care activities - Over the next 180  days, patient will: Check BP if symptomatic, document, and provide at future appointments Ensure daily salt intake < 2300 mg/day Continue exercising for at least 30 minutes 5 times weekly (150 minutes per week total)  Hyperlipidemia Lab Results  Component Value Date/Time   LDLCALC 50 05/23/2020 11:39 AM  Pharmacist Clinical Goal(s): Over the next 90 days, patient will work with PharmD and providers to maintain LDL goal < 70 Current regimen:  Atorvastatin 20mg  daily Interventions: Provided dietary and exercise recommendations Discussed appropriate goals for LDL, HDL and triglycerides Patient self care activities - Over the next 90 days, patient will: Exercise for at least 30 minutes daily 5 times per  week Limits fried foods and fatty foods Use PLATE method for planning a well-balanced diet  Diabetes Lab Results  Component Value Date/Time   HGBA1C 6.7 (H) 05/23/2020 11:39 AM   HGBA1C 7.5 (H) 02/25/2020 04:32 PM  Pharmacist Clinical Goal(s): Over the next 90 days, patient will work with PharmD and providers to maintain A1c goal <7% Current regimen:  Metformin XR 750mg  once daily Ozempic 0.5 mg into the skin once a week (Fridays) Tresiba 100 units/mL 21 units into the skin daily Interventions: Provided dietary and exercise recommendations Recommend patient drink 64 ounces of water daily Recommend patient schedule appointment for Diabetic foot exam  Discussed appropriate goal for fasting blood sugar (80-130) Patient self care activities - Over the next 90 days, patient will: Check blood sugar 3-4 times daily, document, and provide at future appointments Contact provider with any episodes of hypoglycemia Exercise for at least 30 minutes daily 5 times per week   Osteopenia Pharmacist Clinical Goal(s) Over the next 180 days, patient will work with PharmD and providers to take appropriate supplementation to strengthen bones Current regimen:   Vitamin D3 2000 units daily Interventions: Provided patient education about osteopenia and recommended supplements  Encouraged weight bearing exercises 3 days per week Patient self care activities - Over the next 90 days, patient will: Continue calcium 1200mg  daily Start taking Cholecalciferol 2000 units daily (will provide in medication packaging) Start weight-bearing exercises 3 days per week  Medication management Pharmacist Clinical Goal(s): Over the next 180 days, patient will work with PharmD and providers to maintain optimal medication adherence Current pharmacy: [facility] Interventions Comprehensive medication review performed. Verified last fill dates and when patient will need mediations filled again Review all  medications needed for delivery on 03/25/20 today Utilize [facility] for medication synchronization, packaging and delivery Discussed recommended vaccines and appropriate timing Patient self care activities - Over the next 180 days, patient will: Focus on medication adherence by utilization adherence packaging and medication synchronization Take medications as prescribed Report any questions or concerns to PharmD and/or provider(s)  Please see past updates related to this goal by clicking on the "Past Updates" button in the selected goal          This is a list of the screening recommended for you and due dates:  Health Maintenance  Topic Date Due   COVID-19 Vaccine (6 - 2023-24 season) 02/23/2022   Eye exam for diabetics  12/14/2022   Yearly kidney health urinalysis for diabetes  01/24/2023   Flu Shot  01/24/2023   Complete foot exam   01/24/2023   Hemoglobin A1C  04/02/2023   Colon Cancer Screening  04/11/2023   Yearly kidney function blood test for diabetes  07/13/2023   Medicare Annual Wellness Visit  12/05/2023   Mammogram  01/16/2024   DTaP/Tdap/Td vaccine (2 - Td  or Tdap) 11/29/2027   Pneumonia Vaccine  Completed   DEXA scan (bone density measurement)  Completed   Hepatitis C Screening  Completed   Zoster (Shingles) Vaccine  Completed   HPV Vaccine  Aged Out    Advanced directives: Advance directive discussed with you today.   Conditions/risks identified: none  Next appointment: Follow up in one year for your annual wellness visit    Preventive Care 65 Years and Older, Female Preventive care refers to lifestyle choices and visits with your health care provider that can promote health and wellness. What does preventive care include? A yearly physical exam. This is also called an annual well check. Dental exams once or twice a year. Routine eye exams. Ask your health care provider how often you should have your eyes checked. Personal lifestyle choices,  including: Daily care of your teeth and gums. Regular physical activity. Eating a healthy diet. Avoiding tobacco and drug use. Limiting alcohol use. Practicing safe sex. Taking low-dose aspirin every day. Taking vitamin and mineral supplements as recommended by your health care provider. What happens during an annual well check? The services and screenings done by your health care provider during your annual well check will depend on your age, overall health, lifestyle risk factors, and family history of disease. Counseling  Your health care provider may ask you questions about your: Alcohol use. Tobacco use. Drug use. Emotional well-being. Home and relationship well-being. Sexual activity. Eating habits. History of falls. Memory and ability to understand (cognition). Work and work Astronomer. Reproductive health. Screening  You may have the following tests or measurements: Height, weight, and BMI. Blood pressure. Lipid and cholesterol levels. These may be checked every 5 years, or more frequently if you are over 20 years old. Skin check. Lung cancer screening. You may have this screening every year starting at age 1 if you have a 30-pack-year history of smoking and currently smoke or have quit within the past 15 years. Fecal occult blood test (FOBT) of the stool. You may have this test every year starting at age 63. Flexible sigmoidoscopy or colonoscopy. You may have a sigmoidoscopy every 5 years or a colonoscopy every 10 years starting at age 8. Hepatitis C blood test. Hepatitis B blood test. Sexually transmitted disease (STD) testing. Diabetes screening. This is done by checking your blood sugar (glucose) after you have not eaten for a while (fasting). You may have this done every 1-3 years. Bone density scan. This is done to screen for osteoporosis. You may have this done starting at age 75. Mammogram. This may be done every 1-2 years. Talk to your health care provider  about how often you should have regular mammograms. Talk with your health care provider about your test results, treatment options, and if necessary, the need for more tests. Vaccines  Your health care provider may recommend certain vaccines, such as: Influenza vaccine. This is recommended every year. Tetanus, diphtheria, and acellular pertussis (Tdap, Td) vaccine. You may need a Td booster every 10 years. Zoster vaccine. You may need this after age 14. Pneumococcal 13-valent conjugate (PCV13) vaccine. One dose is recommended after age 24. Pneumococcal polysaccharide (PPSV23) vaccine. One dose is recommended after age 76. Talk to your health care provider about which screenings and vaccines you need and how often you need them. This information is not intended to replace advice given to you by your health care provider. Make sure you discuss any questions you have with your health care provider. Document Released: 07/08/2015 Document  Revised: 02/29/2016 Document Reviewed: 04/12/2015 Elsevier Interactive Patient Education  2017 [employer].  Fall Prevention in the Home Falls can cause injuries. They can happen to people of all ages. There are many things you can do to make your home safe and to help prevent falls. What can I do on the outside of my home? Regularly fix the edges of walkways and driveways and fix any cracks. Remove anything that might make you trip as you walk through a door, such as a raised step or threshold. Trim any bushes or trees on the path to your home. Use bright outdoor lighting. Clear any walking paths of anything that might make someone trip, such as rocks or tools. Regularly check to see if handrails are loose or broken. Make sure that both sides of any steps have handrails. Any raised decks and porches should have guardrails on the edges. Have any leaves, snow, or ice cleared regularly. Use sand or salt on walking paths during winter. Clean up any spills in  your garage right away. This includes oil or grease spills. What can I do in the bathroom? Use night lights. Install grab bars by the toilet and in the tub and shower. Do not use towel bars as grab bars. Use non-skid mats or decals in the tub or shower. If you need to sit down in the shower, use a plastic, non-slip stool. Keep the floor dry. Clean up any water that spills on the floor as soon as it happens. Remove soap buildup in the tub or shower regularly. Attach bath mats securely with double-sided non-slip rug tape. Do not have throw rugs and other things on the floor that can make you trip. What can I do in the bedroom? Use night lights. Make sure that you have a light by your bed that is easy to reach. Do not use any sheets or blankets that are too big for your bed. They should not hang down onto the floor. Have a firm chair that has side arms. You can use this for support while you get dressed. Do not have throw rugs and other things on the floor that can make you trip. What can I do in the kitchen? Clean up any spills right away. Avoid walking on wet floors. Keep items that you use a lot in easy-to-reach places. If you need to reach something above you, use a strong step stool that has a grab bar. Keep electrical cords out of the way. Do not use floor polish or wax that makes floors slippery. If you must use wax, use non-skid floor wax. Do not have throw rugs and other things on the floor that can make you trip. What can I do with my stairs? Do not leave any items on the stairs. Make sure that there are handrails on both sides of the stairs and use them. Fix handrails that are broken or loose. Make sure that handrails are as long as the stairways. Check any carpeting to make sure that it is firmly attached to the stairs. Fix any carpet that is loose or worn. Avoid having throw rugs at the top or bottom of the stairs. If you do have throw rugs, attach them to the floor with carpet  tape. Make sure that you have a light switch at the top of the stairs and the bottom of the stairs. If you do not have them, ask someone to add them for you. What else can I do to help prevent falls? Wear  shoes that: Do not have high heels. Have rubber bottoms. Are comfortable and fit you well. Are closed at the toe. Do not wear sandals. If you use a stepladder: Make sure that it is fully opened. Do not climb a closed stepladder. Make sure that both sides of the stepladder are locked into place. Ask someone to hold it for you, if possible. Clearly mark and make sure that you can see: Any grab bars or handrails. First and last steps. Where the edge of each step is. Use tools that help you move around (mobility aids) if they are needed. These include: Canes. Walkers. Scooters. Crutches. Turn on the lights when you go into a dark area. Replace any light bulbs as soon as they burn out. Set up your furniture so you have a clear path. Avoid moving your furniture around. If any of your floors are uneven, fix them. If there are any pets around you, be aware of where they are. Review your medicines with your doctor. Some medicines can make you feel dizzy. This can increase your chance of falling. Ask your doctor what other things that you can do to help prevent falls. This information is not intended to replace advice given to you by your health care provider. Make sure you discuss any questions you have with your health care provider. Document Released: 04/07/2009 Document Revised: 11/17/2015 Document Reviewed: 07/16/2014 Elsevier Interactive Patient Education  2017 [employer].

## 2022-12-19 ENCOUNTER — Encounter: Payer: Self-pay | Admitting: Nurse Practitioner

## 2023-01-01 DIAGNOSIS — H3589 Other specified retinal disorders: Secondary | ICD-10-CM | POA: Diagnosis not present

## 2023-01-01 DIAGNOSIS — H35033 Hypertensive retinopathy, bilateral: Secondary | ICD-10-CM | POA: Diagnosis not present

## 2023-01-01 DIAGNOSIS — H43392 Other vitreous opacities, left eye: Secondary | ICD-10-CM | POA: Diagnosis not present

## 2023-01-01 DIAGNOSIS — E113493 Type 2 diabetes mellitus with severe nonproliferative diabetic retinopathy without macular edema, bilateral: Secondary | ICD-10-CM | POA: Diagnosis not present

## 2023-01-01 DIAGNOSIS — H35071 Retinal telangiectasis, right eye: Secondary | ICD-10-CM | POA: Diagnosis not present

## 2023-01-01 LAB — HM DIABETES EYE EXAM

## 2023-01-03 ENCOUNTER — Other Ambulatory Visit: Payer: Self-pay | Admitting: Nurse Practitioner

## 2023-01-08 DIAGNOSIS — G4733 Obstructive sleep apnea (adult) (pediatric): Secondary | ICD-10-CM | POA: Diagnosis not present

## 2023-01-15 ENCOUNTER — Telehealth: Payer: Self-pay

## 2023-01-15 NOTE — Telephone Encounter (Signed)
Patient called wanting to speak to the new pharmacy over her patient assistant.

## 2023-01-15 NOTE — Telephone Encounter (Signed)
Called patient, left voicemail. Will send MyChart.

## 2023-01-21 ENCOUNTER — Other Ambulatory Visit: Payer: PPO | Admitting: Pharmacist

## 2023-01-21 NOTE — Progress Notes (Signed)
01/21/2023 Name: Shannon [name] MRN: [id number] DOB: 12/12/1955  Chief Complaint  Patient presents with   Medication Management   Diabetes   Hypertension   Hyperlipidemia    Shannon [name] is a 68 y.o. year old female who presented for a telephone visit.   They were referred to the pharmacist by their PCP for assistance in managing diabetes, hypertension, and hyperlipidemia.    Subjective:  Care Team: Primary Care Provider: Arnette Felts, FNP ; Next Scheduled Visit: 01/28/23   Medication Access/Adherence  Current Pharmacy:  [facility] - [place], [place] - [address] [place] [address] Phone: [phone] Fax: [phone]  [facility] [phone] - [place], [place] - [address] AT [name] [address] [place] [address] Phone: [phone] Fax: [phone]   Patient reports affordability concerns with their medications: Yes  Patient reports access/transportation concerns to their pharmacy: No  Patient reports adherence concerns with their medications:  No     Diabetes:  Current medications: Ozempic 1 mg weekly, metformin XR 750 mg daily, Tresiba 16 units daily  Current glucose readings: reports fastings up to 140s, post prandial up to 240s  Previously approved for Ozempic assistance in 2023   Hypertension:  Current medications: lisinopril 10 mg daily  Receives in adherence packages  Hyperlipidemia/ASCVD Risk Reduction  Current lipid lowering medications: atorvastatin 20 mg daily  Receives in adherence packages   Objective:  Lab Results  Component Value Date   HGBA1C 8.4 (H) 10/01/2022    Lab Results  Component Value Date   CREATININE 0.66 07/12/2022   BUN 15 07/12/2022   NA 141 07/12/2022   K 4.2 07/12/2022   CL 105 07/12/2022   CO2 27 07/12/2022    Lab Results  Component Value Date   CHOL 116 10/01/2022   HDL 38 (L) 10/01/2022    LDLCALC 51 10/01/2022   TRIG 161 (H) 10/01/2022   CHOLHDL 3.1 10/01/2022    Medications Reviewed Today     Reviewed by Shannon [name], RPH-CPP (Pharmacist) on 01/21/23 at 1251  Med List Status: <None>   Medication Order Taking? Sig Documenting Provider Last Dose Status Informant  atorvastatin (LIPITOR) 20 MG tablet [id number] Yes TAKE ONE TABLET BY MOUTH EVERY EVENING with A meal Shannon Felts, FNP Taking Active   Blood Glucose Monitoring Suppl (ONE TOUCH ULTRA MINI) w/Device KIT [id number]  1 each by Does not apply route 4 (four) times daily -  before meals and at bedtime. Shannon Felts, FNP  Active Self  Cholecalciferol (VITAMIN D-3 PO) [id number]  Take 2,000 Units by mouth every evening. [provider]  Active Self  Continuous Glucose Sensor (FREESTYLE LIBRE 2 SENSOR) [place] [id number] Yes Apply new sensor every 14 days to monitor blood glucose. Remove old sensor before applying new one. Shannon Felts, FNP Taking Active   famotidine (PEPCID) 20 MG tablet [id number]  TAKE ONE TABLET BY MOUTH EVERY MORNING Shannon Felts, FNP  Active   glucagon (GLUCAGEN HYPOKIT) 1 MG SOLR injection [id number]  Inject 1 mg into the vein once as needed for up to 1 dose for low blood sugar. Shannon Felts, FNP  Active Self  insulin degludec [name]) 100 UNIT/ML FlexTouch Pen [id number] Yes inject 20 units AT bedtime Shannon Felts, FNP Taking Active            Med Note Shannon [name] Jan 21, 2023 12:51  PM) 16 units  lisinopril (ZESTRIL) 10 MG tablet [id number] Yes Take 1 tablet (10 mg total) by mouth every morning. Shannon Felts, FNP Taking Active   metFORMIN (GLUCOPHAGE XR) 750 MG 24 hr tablet [id number] Yes Take 1 tablet (750 mg total) by mouth daily with breakfast. Shannon Felts, FNP Taking Active   ondansetron (ZOFRAN) 4 MG tablet [id number]  Take 1 tablet (4 mg total) by mouth every 8 (eight) hours as needed for nausea or vomiting.  Patient not taking: Reported on 12/05/2022    Shannon Lick, MD  Active   Semaglutide, 1 MG/DOSE, 4 MG/3ML Namon Cirri [id number] Yes Inject 1 mg into the skin once a week. Shannon Felts, FNP Taking Active Self           Med Note Shannon [name], Mel Almond Jan 21, 2023 12:50 PM)                Assessment/Plan:   Diabetes: - Currently uncontrolled - Reviewed long term cardiovascular and renal outcomes of uncontrolled blood sugar - Reviewed goal A1c, goal fasting, and goal 2 hour post prandial glucose - Recommend to increase Ozempic to 2 mg weekly. Continue metformin XR 750 mg daily, Tresiba 16 units daily for now until new order for patient assistance arrives.  - Recommend to check glucose continuously using CGM.    Hypertension: - Currently controlled - Recommend to continue current regimen at this time  Hyperlipidemia/ASCVD Risk Reduction: - Currently controlled at LDL goal <70; TG elevated, but A1c was not at goal at time of last labs - Recommend to continue current regimen at this time   Follow Up Plan: follow up with PCP as scheduled, PharmD in ~ 8 weeks  Shannon [name], PharmD, BCACP, CPP Clinical Pharmacist [name] [phone]

## 2023-01-21 NOTE — Patient Instructions (Signed)
Shannon [name],   My tech team will start the patient assistance application process for Ozempic and [place] online. If we need a physical signature or any proof of income from you, we will be in touch.   Please let me know if you have any questions or concerns in the meantime!  Catie Eppie Gibson, PharmD, BCACP, CPP Clinical Pharmacist [facility] [phone]

## 2023-01-24 ENCOUNTER — Telehealth: Payer: Self-pay

## 2023-01-24 NOTE — Telephone Encounter (Signed)
Submitted application for OZEMPIC & TRESIBA U100 to [employer] for patient assistance via online portal.   Phone: [phone]

## 2023-01-24 NOTE — Telephone Encounter (Signed)
-----   Message from [employer] sent at 01/21/2023  1:41 PM EDT ----- Hi team,   Can we attempt online app for Ozempic 2 mg and Tresiba U100 for [employer]? Patient was approved in 2023.   Thanks!

## 2023-01-28 ENCOUNTER — Ambulatory Visit (INDEPENDENT_AMBULATORY_CARE_PROVIDER_SITE_OTHER): Payer: PPO | Admitting: Nurse Practitioner

## 2023-01-28 ENCOUNTER — Encounter: Payer: Self-pay | Admitting: Nurse Practitioner

## 2023-01-28 VITALS — BP 120/70 | HR 73 | Temp 97.8°F | Ht 63.0 in | Wt 151.6 lb

## 2023-01-28 DIAGNOSIS — G4730 Sleep apnea, unspecified: Secondary | ICD-10-CM | POA: Diagnosis not present

## 2023-01-28 DIAGNOSIS — E113493 Type 2 diabetes mellitus with severe nonproliferative diabetic retinopathy without macular edema, bilateral: Secondary | ICD-10-CM | POA: Diagnosis not present

## 2023-01-28 DIAGNOSIS — Z1231 Encounter for screening mammogram for malignant neoplasm of breast: Secondary | ICD-10-CM

## 2023-01-28 DIAGNOSIS — Z1211 Encounter for screening for malignant neoplasm of colon: Secondary | ICD-10-CM | POA: Diagnosis not present

## 2023-01-28 DIAGNOSIS — E782 Mixed hyperlipidemia: Secondary | ICD-10-CM

## 2023-01-28 DIAGNOSIS — Z0000 Encounter for general adult medical examination without abnormal findings: Secondary | ICD-10-CM

## 2023-01-28 DIAGNOSIS — Z79899 Other long term (current) drug therapy: Secondary | ICD-10-CM

## 2023-01-28 DIAGNOSIS — Z6826 Body mass index (BMI) 26.0-26.9, adult: Secondary | ICD-10-CM

## 2023-01-28 DIAGNOSIS — Z794 Long term (current) use of insulin: Secondary | ICD-10-CM | POA: Diagnosis not present

## 2023-01-28 LAB — POCT URINALYSIS DIP (CLINITEK)
Glucose, UA: NEGATIVE mg/dL
Ketones, POC UA: NEGATIVE mg/dL
Leukocytes, UA: NEGATIVE
Nitrite, UA: NEGATIVE
POC PROTEIN,UA: 30 — AB
Spec Grav, UA: >=1.03 — AB (ref 1.010–1.025)
Urobilinogen, UA: 0.2 E.U./dL
pH, UA: 5.5 (ref 5.0–8.0)

## 2023-01-28 NOTE — Assessment & Plan Note (Signed)
Behavior modifications discussed and diet history reviewed.   Pt will continue to exercise regularly and modify diet with low GI, plant based foods and decrease intake of processed foods.  Recommend intake of daily multivitamin, Vitamin D, and calcium.  Recommend mammogram and colonoscopy for preventive screenings, as well as recommend immunizations that include influenza, TDAP, and Shingles  

## 2023-01-28 NOTE — Assessment & Plan Note (Signed)
Tolerating her CPAP well and retina specialist feel this has help improve her eyes

## 2023-01-28 NOTE — Assessment & Plan Note (Signed)
Pt instructed on Self Breast Exam.According to ACOG guidelines Women aged 69 and older are recommended to get an annual mammogram.  Referral to breast center.

## 2023-01-28 NOTE — Assessment & Plan Note (Signed)
Cholesterol levels are slightly elevated at last visit, she is to continue her statin.

## 2023-01-28 NOTE — Assessment & Plan Note (Signed)
According to USPTF Colorectal cancer Screening guidelines. Colonoscopy is recommended every 10 years, starting at age 69 years. Referral to Dr. Loreta Ave done

## 2023-01-28 NOTE — Assessment & Plan Note (Signed)
Blood sugars have been more elevated since she is taking 1/2 her dose 0.5 mg weekly of Ozempic. She is in the process of getting patient assistance for her Ozempic and [place]. One sample of Ozempic 0.5 mg given in office

## 2023-01-28 NOTE — Progress Notes (Signed)
Shannon [name], CMA,acting as a Neurosurgeon for Arnette Felts, FNP.,have documented all relevant documentation on the behalf of Arnette Felts, FNP,as directed by  Arnette Felts, FNP while in the presence of Arnette Felts, FNP.  Subjective:    Patient ID: Shannon [name] , female    DOB: 08-10-1955 , 67 y.o.   MRN: [id number]  Chief Complaint  Patient presents with   Annual Exam    HPI  Patient presents today for a HM Patient reports compliance with medications. Patient denies any headaches, SOB, or chest pain. Patient has no other concerns today. Continues to f/u with Dr. Shon Baton and Dr Luciana Axe ophthalmology - done with injections.   BP Readings from Last 3 Encounters: 01/28/23 : 120/70 10/01/22 : (!) 118/58 07/18/22 : 126/64    Diabetes She presents for her follow-up diabetic visit. She has type 2 diabetes mellitus. Her disease course has been stable. Pertinent negatives for hypoglycemia include no confusion, dizziness or nervousness/anxiousness. Pertinent negatives for diabetes include no fatigue, no polydipsia, no polyphagia and no polyuria. There are no hypoglycemic complications. Symptoms are stable. There are no diabetic complications. Risk factors for coronary artery disease include diabetes mellitus, hypertension and obesity. Current diabetic treatment includes oral agent (dual therapy). She is compliant with treatment all of the time (she has only been taking 0.25 mg weekly, trying to spread out her medication when she did not have insurance). She is following a generally healthy diet. When asked about meal planning, she reported none. She has not had a previous visit with a dietitian. She participates in exercise every other day. Her home blood glucose trend is increasing steadily. Her overall blood glucose range is 140-180 mg/dl. (This morning is 158. Continues to use libre) An ACE inhibitor/angiotensin II receptor blocker is being taken. She does not see a podiatrist.Eye exam current: up to  date.     Past Medical History:  Diagnosis Date   COVID-19 virus infection 10/13/2018   Diabetes mellitus without complication (HCC)    Hypercholesteremia    Sleep apnea    Newly diagnosed in September 2021     Family History  Problem Relation Age of Onset   Hypertension Mother    Diabetes Mother    Stroke Mother    Diabetes Father    Hypertension Father    Stroke Father    Hypertension Son    Diabetes Son    Breast cancer Neg Hx      Current Outpatient Medications:    atorvastatin (LIPITOR) 20 MG tablet, TAKE ONE TABLET BY MOUTH EVERY EVENING with A meal, Disp: 90 tablet, Rfl: 1   Blood Glucose Monitoring Suppl (ONE TOUCH ULTRA MINI) w/Device KIT, 1 each by Does not apply route 4 (four) times daily -  before meals and at bedtime., Disp: 1 kit, Rfl: 0   Cholecalciferol (VITAMIN D-3 PO), Take 2,000 Units by mouth every evening., Disp: , Rfl:    Continuous Glucose Sensor (FREESTYLE LIBRE 2 SENSOR) MISC, Apply new sensor every 14 days to monitor blood glucose. Remove old sensor before applying new one., Disp: 3 each, Rfl: 2   glucagon (GLUCAGEN HYPOKIT) 1 MG SOLR injection, Inject 1 mg into the vein once as needed for up to 1 dose for low blood sugar., Disp: 1 each, Rfl: 5   insulin degludec (TRESIBA FLEXTOUCH) 100 UNIT/ML FlexTouch Pen, inject 20 units AT bedtime, Disp: 15 mL, Rfl: 2   lisinopril (ZESTRIL) 10 MG tablet, Take 1 tablet (10 mg total) by mouth every  morning., Disp: 90 tablet, Rfl: 1   metFORMIN (GLUCOPHAGE XR) 750 MG 24 hr tablet, Take 1 tablet (750 mg total) by mouth daily with breakfast., Disp: 90 tablet, Rfl: 1   Semaglutide, 1 MG/DOSE, 4 MG/3ML SOPN, Inject 1 mg into the skin once a week., Disp: 9 mL, Rfl: 1   ondansetron (ZOFRAN) 4 MG tablet, Take 1 tablet (4 mg total) by mouth every 8 (eight) hours as needed for nausea or vomiting. (Patient not taking: Reported on 12/05/2022), Disp: 20 tablet, Rfl: 0   No Known Allergies    The patient states she uses status  post hysterectomy for birth control. No LMP recorded. Patient has had a hysterectomy.. Negative for Dysmenorrhea and Negative for Menorrhagia. Negative for: breast discharge, breast lump(s), breast pain and breast self exam. Associated symptoms include abnormal vaginal bleeding. Pertinent negatives include abnormal bleeding (hematology), anxiety, decreased libido, depression, difficulty falling sleep, dyspareunia, history of infertility, nocturia, sexual dysfunction, sleep disturbances, urinary incontinence, urinary urgency, vaginal discharge and vaginal itching. Diet regular; she has had to cut her Ozempic in half due to being in the doughnut hole. She has been speaking with [facility] pharmacy. The patient states her exercise level is moderate with water aerobics 4 days a week. Has not started back with regular aerobics due to gait imbalance.   The patient's tobacco use is:  Social History   Tobacco Use  Smoking Status Former   Current packs/day: 0.00   Types: Cigarettes   Quit date: 2006   Years since quitting: 18.6  Smokeless Tobacco Never  Tobacco Comments   quit 10 yrs   She has been exposed to passive smoke. The patient's alcohol use is:  Social History   Substance and Sexual Activity  Alcohol Use Yes   Comment: 1 glass of wine sometimes daily     Review of Systems  Constitutional: Negative.  Negative for fatigue.  HENT: Negative.    Eyes: Negative.   Respiratory: Negative.    Cardiovascular: Negative.   Gastrointestinal: Negative.   Endocrine: Negative.  Negative for polydipsia, polyphagia and polyuria.  Genitourinary: Negative.   Musculoskeletal: Negative.   Skin: Negative.   Allergic/Immunologic: Negative.   Neurological:  Negative for dizziness.  Hematological: Negative.   Psychiatric/Behavioral: Negative.  Negative for confusion. The patient is not nervous/anxious.      Today's Vitals   01/28/23 1110  BP: 120/70  Pulse: 73  Temp: 97.8 F (36.6 C)  TempSrc: Oral   Weight: 151 lb 9.6 oz (68.8 kg)  Height: 5\' 3"  (1.6 m)  PainSc: 0-No pain   Body mass index is 26.85 kg/m.  Wt Readings from Last 3 Encounters:  01/28/23 151 lb 9.6 oz (68.8 kg)  12/05/22 140 lb (63.5 kg)  10/01/22 150 lb (68 kg)     Objective:  Physical Exam Vitals reviewed.  Constitutional:      General: She is not in acute distress.    Appearance: Normal appearance. She is well-developed.  HENT:     Head: Normocephalic and atraumatic.     Right Ear: Hearing, tympanic membrane, ear canal and external ear normal. There is no impacted cerumen.     Left Ear: Hearing, tympanic membrane, ear canal and external ear normal. There is no impacted cerumen.     Nose: Nose normal.     Mouth/Throat:     Mouth: Mucous membranes are moist.  Eyes:     General: Lids are normal.     Extraocular Movements: Extraocular movements intact.  Conjunctiva/sclera: Conjunctivae normal.     Pupils: Pupils are equal, round, and reactive to light.     Funduscopic exam:    Right eye: No papilledema.        Left eye: No papilledema.  Neck:     Thyroid: No thyroid mass.     Vascular: No carotid bruit.  Cardiovascular:     Rate and Rhythm: Normal rate and regular rhythm.     Pulses: Normal pulses.     Heart sounds: Normal heart sounds. No murmur heard. Pulmonary:     Effort: Pulmonary effort is normal. No respiratory distress.     Breath sounds: Normal breath sounds. No wheezing.  Chest:     Chest wall: No mass.  Breasts:    Tanner Score is 5.     Right: Normal. No mass or tenderness.     Left: Normal. No mass or tenderness.  Abdominal:     General: Abdomen is flat. Bowel sounds are normal. There is no distension.     Palpations: Abdomen is soft.     Tenderness: There is no abdominal tenderness.  Genitourinary:    Comments: Followed by GYN Musculoskeletal:        General: No swelling or tenderness. Normal range of motion.     Cervical back: Full passive range of motion without pain,  normal range of motion and neck supple.     Right lower leg: No edema.     Left lower leg: No edema.  Lymphadenopathy:     Upper Body:     Right upper body: No supraclavicular, axillary or pectoral adenopathy.     Left upper body: No supraclavicular, axillary or pectoral adenopathy.  Skin:    General: Skin is warm and dry.     Capillary Refill: Capillary refill takes less than 2 seconds.     Coloration: Skin is not jaundiced.     Findings: No bruising.  Neurological:     General: No focal deficit present.     Mental Status: She is alert and oriented to person, place, and time.     Cranial Nerves: No cranial nerve deficit.     Sensory: No sensory deficit.  Psychiatric:        Mood and Affect: Mood normal.        Behavior: Behavior normal.        Thought Content: Thought content normal.        Judgment: Judgment normal.         Assessment And Plan:     Encounter for annual health examination Assessment & Plan: Behavior modifications discussed and diet history reviewed.   Pt will continue to exercise regularly and modify diet with low GI, plant based foods and decrease intake of processed foods.  Recommend intake of daily multivitamin, Vitamin D, and calcium.  Recommend mammogram and colonoscopy for preventive screenings, as well as recommend immunizations that include influenza, TDAP, and Shingles    Encounter for screening mammogram for breast cancer Assessment & Plan: Pt instructed on Self Breast Exam.According to ACOG guidelines Women aged 40 and older are recommended to get an annual mammogram.  Referral to breast center.   Orders: -     3D Screening Mammogram, Left and Right; Future  Encounter for screening colonoscopy Assessment & Plan: According to USPTF Colorectal cancer Screening guidelines. Colonoscopy is recommended every 10 years, starting at age 46 years. Referral to Dr. 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 She is in the process of getting patient assistance for her Ozempic and [place]. One sample of Ozempic 0.5 mg given in office  Orders: -     EKG 12-Lead -     POCT URINALYSIS DIP (CLINITEK) -     Microalbumin / creatinine urine ratio -     Hemoglobin A1c  Mixed hyperlipidemia Assessment & Plan: Cholesterol levels are slightly elevated at last visit, she is to continue her statin.   Orders: -     CMP14+EGFR -     Lipid panel  Sleep apnea treated with continuous positive airway pressure (CPAP) Assessment & Plan: Tolerating her CPAP well and retina specialist feel this has help improve her eyes   Other long term (current) drug therapy -     CBC with Differential/Platelet   Return for 1 year physical, controlled DM check 4 months; NV flu when available. Patient was given opportunity to ask questions. Patient verbalized understanding of the plan and was able to repeat key elements of the plan. All questions were answered to their satisfaction.   Arnette Felts, FNP  I, Arnette Felts, FNP, have reviewed all documentation for this visit. The documentation on 01/28/23 for the exam, diagnosis, procedures, and orders are all accurate and complete.

## 2023-01-29 ENCOUNTER — Emergency Department (HOSPITAL_COMMUNITY)
Admission: EM | Admit: 2023-01-29 | Discharge: 2023-01-29 | Disposition: A | Discharge status: Home / Self Care | Payer: PPO | Attending: Emergency Medicine | Admitting: Emergency Medicine

## 2023-01-29 ENCOUNTER — Encounter (HOSPITAL_COMMUNITY): Payer: Self-pay

## 2023-01-29 ENCOUNTER — Other Ambulatory Visit: Payer: Self-pay

## 2023-01-29 ENCOUNTER — Emergency Department (HOSPITAL_COMMUNITY): Payer: PPO

## 2023-01-29 DIAGNOSIS — D649 Anemia, unspecified: Secondary | ICD-10-CM | POA: Diagnosis not present

## 2023-01-29 DIAGNOSIS — E119 Type 2 diabetes mellitus without complications: Secondary | ICD-10-CM | POA: Diagnosis not present

## 2023-01-29 DIAGNOSIS — R079 Chest pain, unspecified: Secondary | ICD-10-CM

## 2023-01-29 DIAGNOSIS — Z7984 Long term (current) use of oral hypoglycemic drugs: Secondary | ICD-10-CM | POA: Insufficient documentation

## 2023-01-29 DIAGNOSIS — R0789 Other chest pain: Secondary | ICD-10-CM | POA: Diagnosis not present

## 2023-01-29 DIAGNOSIS — Z794 Long term (current) use of insulin: Secondary | ICD-10-CM | POA: Insufficient documentation

## 2023-01-29 DIAGNOSIS — I517 Cardiomegaly: Secondary | ICD-10-CM | POA: Diagnosis not present

## 2023-01-29 LAB — PROTIME-INR
INR: 1 (ref 0.8–1.2)
Prothrombin Time: 13.1 seconds (ref 11.4–15.2)

## 2023-01-29 LAB — CBC
HCT: 36 % (ref 36.0–46.0)
Hemoglobin: 11.5 g/dL — ABNORMAL LOW (ref 12.0–15.0)
MCH: 27.9 pg (ref 26.0–34.0)
MCHC: 31.9 g/dL (ref 30.0–36.0)
MCV: 87.4 fL (ref 80.0–100.0)
Platelets: 242 10*3/uL (ref 150–400)
RBC: 4.12 MIL/uL (ref 3.87–5.11)
RDW: 15.5 % (ref 11.5–15.5)
WBC: 6.2 10*3/uL (ref 4.0–10.5)
nRBC: 0 % (ref 0.0–0.2)

## 2023-01-29 LAB — BASIC METABOLIC PANEL
Anion gap: 12 (ref 5–15)
BUN: 20 mg/dL (ref 8–23)
CO2: 21 mmol/L — ABNORMAL LOW (ref 22–32)
Calcium: 9.2 mg/dL (ref 8.9–10.3)
Chloride: 104 mmol/L (ref 98–111)
Creatinine, Ser: 0.78 mg/dL (ref 0.44–1.00)
GFR, Estimated: >60 mL/min (ref >60)
Glucose, Bld: 199 mg/dL — ABNORMAL HIGH (ref 70–99)
Potassium: 3.7 mmol/L (ref 3.5–5.1)
Sodium: 137 mmol/L (ref 135–145)

## 2023-01-29 LAB — TROPONIN I (HIGH SENSITIVITY)
Troponin I (High Sensitivity): 4 ng/L (ref <18)
Troponin I (High Sensitivity): 4 ng/L (ref <18)

## 2023-01-29 MED ORDER — NITROGLYCERIN 0.4 MG SL SUBL
0.4000 mg | SUBLINGUAL_TABLET | SUBLINGUAL | Status: DC | PRN
  Administered 2023-01-29 (×2): 0.4 mg via SUBLINGUAL
  Filled 2023-01-29 (×2): qty 1

## 2023-01-29 MED ORDER — ASPIRIN 81 MG PO CHEW
324.0000 mg | CHEWABLE_TABLET | Freq: Once | ORAL | Status: AC
  Administered 2023-01-29: 324 mg via ORAL
  Filled 2023-01-29: qty 4

## 2023-01-29 NOTE — Discharge Instructions (Signed)
Your evaluation today did not show any sign of any heart problems.  However, I am referring you to cardiology for further outpatient evaluation.  At any point, if you have any concerning symptoms, please feel free to return to the emergency department.

## 2023-01-29 NOTE — ED Triage Notes (Signed)
Patient arrives with left sided chest pain that radiates to her left arm. Patient states she also has numbness in the left arm. Patient states this happened 10 years ago and she needed a heart catheterization. No blood thinners. History of DM.

## 2023-01-29 NOTE — ED Provider Notes (Signed)
[place] EMERGENCY DEPARTMENT AT [facility] Provider Note   CSN: [id number] Arrival date & time: 01/29/23  0100     History  Chief Complaint  Patient presents with   Chest Pain    Shannon [name] is a 67 y.o. female.  The history is provided by the patient.  Chest Pain She has history of diabetes, hyperlipidemia and comes in because an episode of chest discomfort which started about 8:30 PM.  Onset was at rest.  She describes a sharp pain which then became a pressure feeling in the lower sternal area with some radiation to the back.  She also developed some numbness in her left hand which prompted her to come to the emergency department.  There was very slight associated dyspnea but no nausea or diaphoresis.  She did not notice pain was worse with walking or deep breath.  She has not taken anything for pain.  She does relate that she was evaluated for chest pain and had a cardiac catheterization but thinks that was 15-20 years ago.  She has not had any follow-up with cardiology.  She is a non-smoker with no history of hypertension.  There is a family history of premature coronary atherosclerosis (father had a heart attack at approximately age 67).   Home Medications Prior to Admission medications   Medication Sig Start Date End Date Taking? Authorizing Provider  atorvastatin (LIPITOR) 20 MG tablet TAKE ONE TABLET BY MOUTH EVERY EVENING with A meal 09/10/22   Arnette Felts, FNP  Blood Glucose Monitoring Suppl (ONE TOUCH ULTRA MINI) w/Device KIT 1 each by Does not apply route 4 (four) times daily -  before meals and at bedtime. 11/16/19   Arnette Felts, FNP  Cholecalciferol (VITAMIN D-3 PO) Take 2,000 Units by mouth every evening.    [provider]  Continuous Glucose Sensor (FREESTYLE LIBRE 2 SENSOR) MISC Apply new sensor every 14 days to monitor blood glucose. Remove old sensor before applying new one. 11/21/22   Arnette Felts, FNP  glucagon (GLUCAGEN HYPOKIT) 1 MG SOLR  injection Inject 1 mg into the vein once as needed for up to 1 dose for low blood sugar. 02/22/21   Arnette Felts, FNP  insulin degludec (TRESIBA FLEXTOUCH) 100 UNIT/ML FlexTouch Pen inject 20 units AT bedtime 10/01/22   Arnette Felts, FNP  lisinopril (ZESTRIL) 10 MG tablet Take 1 tablet (10 mg total) by mouth every morning. 09/10/22   Arnette Felts, FNP  metFORMIN (GLUCOPHAGE XR) 750 MG 24 hr tablet Take 1 tablet (750 mg total) by mouth daily with breakfast. 09/10/22 09/10/23  Arnette Felts, FNP  ondansetron (ZOFRAN) 4 MG tablet Take 1 tablet (4 mg total) by mouth every 8 (eight) hours as needed for nausea or vomiting. Patient not taking: Reported on 12/05/2022 07/16/22   Venita Lick, MD  Semaglutide, 1 MG/DOSE, 4 MG/3ML SOPN Inject 1 mg into the skin once a week. 07/04/22   Arnette Felts, FNP      Allergies    Patient has no known allergies.    Review of Systems   Review of Systems  Cardiovascular:  Positive for chest pain.  All other systems reviewed and are negative.   Physical Exam Updated Vital Signs BP (!) 179/74 (BP Location: Right Arm)   Pulse 81   Temp (!) 97.5 F (36.4 C)   Resp (!) 22   SpO2 99%  Physical Exam Vitals and nursing note reviewed.   67 year old female, resting comfortably and in no acute  distress. Vital signs are significant for elevated blood pressure and slightly elevated respiratory rate. Oxygen saturation is 99%, which is normal. Head is normocephalic and atraumatic. PERRLA, EOMI. Oropharynx is clear. Neck is nontender and supple without adenopathy or JVD. Back is nontender and there is no CVA tenderness. Lungs are clear without rales, wheezes, or rhonchi. Chest is nontender. Heart has regular rate and rhythm without murmur. Abdomen is soft, flat, nontender. Extremities have no cyanosis or edema, full range of motion is present. Skin is warm and dry without rash. Neurologic: Mental status is normal, cranial nerves are intact, moves all extremities  equally.  ED Results / Procedures / Treatments   Labs (all labs ordered are listed, but only abnormal results are displayed) Labs Reviewed  BASIC METABOLIC PANEL - Abnormal; Notable for the following components:      Result Value   CO2 21 (*)    Glucose, Bld 199 (*)    All other components within normal limits  CBC - Abnormal; Notable for the following components:   Hemoglobin 11.5 (*)    All other components within normal limits  PROTIME-INR  TROPONIN I (HIGH SENSITIVITY)    EKG EKG Interpretation Date/Time:  Tuesday January 29 2023 00:51:48 EDT Ventricular Rate:  79 PR Interval:  150 QRS Duration:  78 QT Interval:  396 QTC Calculation: 454 R Axis:   23  Text Interpretation: Normal sinus rhythm Nonspecific ST and T wave abnormality Abnormal ECG When compared with ECG of 18-Oct-2018 06:27, No significant change was found Confirmed by Dione Booze ([id number]) on 01/29/2023 1:40:17 AM  Radiology DG Chest 2 View  Result Date: 01/29/2023 CLINICAL DATA:  Chest pain. EXAM: CHEST - 2 VIEW COMPARISON:  Portable chest 10/12/2018 FINDINGS: The heart is slightly enlarged. No vascular congestion is seen. There is a normal mediastinal configuration. The lungs are clear. Mild osteopenia. Multiple overlying monitor wires. IMPRESSION: No evidence of acute chest disease. Stable chest with mild cardiomegaly. Electronically Signed   By: Almira Bar M.D.   On: 01/29/2023 02:01    Procedures Procedures  Cardiac monitor shows normal sinus rhythm, per my interpretation.  Medications Ordered in ED Medications  aspirin chewable tablet 324 mg (has no administration in time range)  nitroGLYCERIN (NITROSTAT) SL tablet 0.4 mg (has no administration in time range)    ED Course/ Medical Decision Making/ A&P                                 Medical Decision Making Amount and/or Complexity of Data Reviewed Labs: ordered. Radiology: ordered.  Risk OTC drugs. Prescription drug management.   Chest  discomfort concerning for angina/ACS.  Differential diagnosis includes, but is not limited to, GERD, musculoskeletal pain, peptic ulcer disease, esophageal spasm.  No risk factors for pulmonary embolism.  Differential diagnosis includes conditions with significant risk for morbidity and complications.  I have reviewed and interpreted her electrocardiogram, and my interpretation is nonspecific ST and T changes but are unchanged from prior.  Chest x-ray shows no acute process.  I have independently viewed the images, and agree with radiologist's interpretation.  I have reviewed and interpreted her laboratory tests, my interpretation is mild hyperglycemia consistent with known history of diabetes, stable anemia, normal initial troponin with repeat troponin pending.  I have ordered aspirin and sublingual nitroglycerin.  I have reviewed her past records and can find no record of cardiac evaluation, but her previous cardiac catheterization would have  been done before records would be on our computer.  I have reviewed and interpreted her laboratory tests, and my interpretation is mild anemia, mildly elevated glucose consistent with known history of diabetes, normal troponin x 2.  She noted significant, but not complete improvement with above-noted treatment.  Pain is present but much improved.  At this point, I see no evidence of acute cardiac process and I feel she is safe for discharge.  However, I am referring her to cardiology for consideration for outpatient stress testing.  Final Clinical Impression(s) / ED Diagnoses Final diagnoses:  Nonspecific chest pain  Normochromic normocytic anemia    Rx / DC Orders ED Discharge Orders     None         Dione Booze, MD 01/29/23 [phone]

## 2023-01-31 NOTE — Telephone Encounter (Signed)
Received notification from [employer] regarding approval for [facility] & [employer] Patient assistance approved from 01/29/23 to 01/23/24.  Medication should arrive to office in 10-14 business days  Phone: [phone]

## 2023-02-05 ENCOUNTER — Telehealth: Payer: Self-pay | Admitting: *Deleted

## 2023-02-05 NOTE — Telephone Encounter (Signed)
Transition Care Management Follow-up Telephone Call Date of discharge and from where: The . [facility]  01/29/2023 How have you been since you were released from the hospital? Some better Any questions or concerns? No  Items Reviewed: Did the pt receive and understand the discharge instructions provided? Yes  Medications obtained and verified? No  Other? No  Any new allergies since your discharge? No  Dietary orders reviewed? No Do you have support at home? Yes     Follow up appointments reviewed:  PCP Hospital f/u appt confirmed? No  States she intends to follow up  . Are transportation arrangements needed? No  If their condition worsens, is the pt aware to call PCP or go to the Emergency Dept.? Yes Was the patient provided with contact information for the PCP's office or ED? Yes Was to pt encouraged to call back with questions or concerns? Yes

## 2023-02-07 ENCOUNTER — Telehealth: Payer: Self-pay | Admitting: *Deleted

## 2023-02-07 NOTE — Telephone Encounter (Signed)
Transition Care Management Unsuccessful Follow-up Telephone Call  Date of discharge and from where:  [facility] [facility] 01/29/2023  Attempts:  2nd Attempt  Reason for unsuccessful TCM follow-up call:  No answer/busy

## 2023-02-15 ENCOUNTER — Other Ambulatory Visit: Payer: Self-pay | Admitting: Nurse Practitioner

## 2023-02-15 DIAGNOSIS — E119 Type 2 diabetes mellitus without complications: Secondary | ICD-10-CM

## 2023-02-19 ENCOUNTER — Other Ambulatory Visit: Payer: Self-pay | Admitting: Nurse Practitioner

## 2023-02-19 ENCOUNTER — Ambulatory Visit: Admission: RE | Admit: 2023-02-19 | Payer: PPO | Source: Ambulatory Visit

## 2023-02-19 DIAGNOSIS — Z1231 Encounter for screening mammogram for malignant neoplasm of breast: Secondary | ICD-10-CM | POA: Diagnosis not present

## 2023-02-20 ENCOUNTER — Telehealth: Payer: Self-pay

## 2023-02-20 NOTE — Telephone Encounter (Signed)
error 

## 2023-02-25 ENCOUNTER — Other Ambulatory Visit: Payer: Self-pay | Admitting: Nurse Practitioner

## 2023-02-25 DIAGNOSIS — E113493 Type 2 diabetes mellitus with severe nonproliferative diabetic retinopathy without macular edema, bilateral: Secondary | ICD-10-CM

## 2023-02-28 ENCOUNTER — Other Ambulatory Visit: Payer: Self-pay | Admitting: Nurse Practitioner

## 2023-02-28 DIAGNOSIS — E119 Type 2 diabetes mellitus without complications: Secondary | ICD-10-CM | POA: Diagnosis not present

## 2023-02-28 DIAGNOSIS — Z1211 Encounter for screening for malignant neoplasm of colon: Secondary | ICD-10-CM | POA: Diagnosis not present

## 2023-02-28 DIAGNOSIS — K5904 Chronic idiopathic constipation: Secondary | ICD-10-CM | POA: Diagnosis not present

## 2023-02-28 DIAGNOSIS — E782 Mixed hyperlipidemia: Secondary | ICD-10-CM | POA: Diagnosis not present

## 2023-02-28 DIAGNOSIS — Z794 Long term (current) use of insulin: Secondary | ICD-10-CM

## 2023-03-03 ENCOUNTER — Other Ambulatory Visit: Payer: Self-pay | Admitting: Nurse Practitioner

## 2023-03-10 ENCOUNTER — Other Ambulatory Visit: Payer: Self-pay | Admitting: Nurse Practitioner

## 2023-03-18 ENCOUNTER — Other Ambulatory Visit: Payer: PPO | Admitting: Pharmacist

## 2023-03-18 DIAGNOSIS — Z794 Long term (current) use of insulin: Secondary | ICD-10-CM

## 2023-03-18 NOTE — Progress Notes (Unsigned)
03/18/2023 Name: Shannon [name] MRN: [id number] DOB: 10-May-1956  Chief Complaint  Patient presents with   Medication Management   Diabetes   Hypertension   Hyperlipidemia    Shannon [name] is a 67 y.o. year old female who presented for a telephone visit.   They were referred to the pharmacist by their PCP for assistance in managing diabetes, hypertension, and hyperlipidemia.    Subjective:  Care Team: Primary Care Provider: Arnette Felts, FNP ; Next Scheduled Visit: 06/03/23  Medication Access/Adherence  Current Pharmacy:  [facility] - [place], [place] - [address] [place] [address] Phone: [phone] Fax: [phone]  [facility] - [address] [place] - [address] [place] [address] Phone: [phone] Fax: [phone]   Patient reports affordability concerns with their medications: No  Patient reports access/transportation concerns to their pharmacy: No  Patient reports adherence concerns with their medications:  No    Receiving medications through adherence packaging at [facility] pharmacy.   Diabetes:  Current medications: Ozempic 1 mg weekly, Tresiba 16 units daily, metformin XR 750 mg daily   Denies any GI upset, nausea, constipation on Ozempic 1 mg weekly.   Patient denies hypoglycemic s/sx including dizziness, shakiness, sweating. Notes she received the Ozempic 2 mg pen strength from [employer].   Current medication access support: Ozempic and Tresiba through [employer] patient assistance  Hypertension:  Current medications: lisinopril 10 mg daily    Hyperlipidemia/ASCVD Risk Reduction  Current lipid lowering medications: atorvastatin 20 mg daily, though [facility] 10 mg daily. Patient confirms adherence.   Objective:  Lab Results  Component Value Date   HGBA1C 6.6 (H) 01/28/2023    Lab Results  Component Value Date   CREATININE  0.78 01/29/2023   BUN 20 01/29/2023   NA 137 01/29/2023   K 3.7 01/29/2023   CL 104 01/29/2023   CO2 21 (L) 01/29/2023    Lab Results  Component Value Date   CHOL 205 (H) 01/28/2023   HDL 56 01/28/2023   LDLCALC 105 (H) 01/28/2023   TRIG 258 (H) 01/28/2023   CHOLHDL 3.7 01/28/2023    Medications Reviewed Today     Reviewed by Alden Hipp, RPH-CPP (Pharmacist) on 03/18/23 at 1441  Med List Status: <None>   Medication Order Taking? Sig Documenting Provider Last Dose Status Informant  atorvastatin (LIPITOR) 20 MG tablet [id number] Yes TAKE 1 TABLET BY MOUTH EACH EVENING WITH A MEAL *REFILL REQUESTArnette Felts, FNP Taking Active   Blood Glucose Monitoring Suppl (ONE TOUCH ULTRA MINI) w/Device KIT [id number] Yes 1 each by Does not apply route 4 (four) times daily -  before meals and at bedtime. Arnette Felts, FNP Taking Active Self  Cholecalciferol (VITAMIN D-3 PO) [id number]  Take 2,000 Units by mouth every evening. [provider]  Active Self  Continuous Glucose Sensor (FREESTYLE LIBRE 2 SENSOR) [place] [id number] Yes USE TO MONITOR BLOOD SUGAR. CHANGE SENSOR EVERY 14 DAYS. Arnette Felts, FNP Taking Active   glucagon (GLUCAGEN HYPOKIT) 1 MG SOLR injection [id number] No Inject 1 mg into the vein once as needed for up to 1 dose for low blood sugar.  Patient not taking: Reported on 03/18/2023   Arnette Felts, FNP Not Taking Active Self  insulin degludec (TRESIBA FLEXTOUCH) 100 UNIT/ML FlexTouch Pen [id number] Yes INJECT 20 UNITS SUBCUTANEOUSLY AT BEDTIME Arnette Felts, FNP Taking Active  Med Note Raeanne Gathers Mar 18, 2023  2:38 PM) 16 units daily  lisinopril (ZESTRIL) 10 MG tablet [id number] Yes TAKE 1 TABLET BY MOUTH EVERY MORNING *REFILL REQUESTChristell Constant, Lolita Cram, FNP Taking Active   metFORMIN (GLUCOPHAGE-XR) 750 MG 24 hr tablet [id number] Yes TAKE 1 TABLET BY MOUTH EVERY MORNING WITH BREAKFAST *REFILL REQUESTArnette Felts, FNP Taking Active    Semaglutide, 1 MG/DOSE, 4 MG/3ML SOPN [id number] Yes Inject 1 mg into the skin once a week. Arnette Felts, FNP Taking Active Self           Med Note Clearance Coots, Mel Almond Jan 21, 2023 12:50 PM)                Assessment/Plan:   Diabetes: - Currently controlled with opportunity for optimization - Reviewed long term cardiovascular and renal outcomes of uncontrolled blood sugar - Reviewed goal A1c, goal fasting, and goal 2 hour post prandial glucose - Recommend to increase Ozempic to 2 mg weekly, eliminate Tresiba to reduce risk of hypoglycemia. Continue metformin XR 750 mg daily. Discussed with PCP and patient.  - Recommend to check glucose using Libre. Will discuss upgrade to [place] 3 plus at next visit.   Hypertension: - Currently controlled - Recommend to continue current regimen.  Hyperlipidemia/ASCVD Risk Reduction: - Currently uncontrolled.  - Recommend to increase atorvastatin to 40 mg daily. Discussed with PCP and patient. Follow up lipids with next labs   Follow Up Plan: phone call in 4 weeks  Catie TClearance Coots, PharmD, BCACP, CPP Clinical Pharmacist [facility] [phone]

## 2023-03-18 NOTE — Patient Instructions (Signed)
Amand,   It was great talking with you today!  Increase Ozempic to 2 mg weekly, stop [place]. Continue metformin XR 750 mg daily.   Let's increase atorvastatin to 40 mg daily to target an LDL less than 70.   Please reach out with any questions or concerns!  Catie Eppie Gibson, PharmD, BCACP, CPP Clinical Pharmacist [facility] [phone]

## 2023-03-19 ENCOUNTER — Telehealth: Payer: Self-pay | Admitting: Nurse Practitioner

## 2023-03-19 MED ORDER — SEMAGLUTIDE (2 MG/DOSE) 8 MG/3ML ~~LOC~~ SOPN: 2.0000 mg | PEN_INJECTOR | SUBCUTANEOUS | Status: DC

## 2023-03-19 MED ORDER — ATORVASTATIN CALCIUM 40 MG PO TABS
ORAL_TABLET | ORAL | 1 refills | Status: DC
Start: 2023-03-19 — End: 2023-08-20

## 2023-03-19 NOTE — Telephone Encounter (Signed)
PICK UP 02/21/23 TRESIBA U100 5X3ML QUANTITY 2 OZEMPIC 2MG  1X3ML QUANTITY 4

## 2023-03-29 ENCOUNTER — Ambulatory Visit: Payer: PPO | Admitting: Cardiology

## 2023-03-29 DIAGNOSIS — I700 Atherosclerosis of aorta: Secondary | ICD-10-CM | POA: Insufficient documentation

## 2023-04-01 DIAGNOSIS — U071 COVID-19: Secondary | ICD-10-CM | POA: Diagnosis not present

## 2023-04-01 DIAGNOSIS — R509 Fever, unspecified: Secondary | ICD-10-CM | POA: Diagnosis not present

## 2023-04-01 DIAGNOSIS — R059 Cough, unspecified: Secondary | ICD-10-CM | POA: Diagnosis not present

## 2023-04-08 DIAGNOSIS — G4733 Obstructive sleep apnea (adult) (pediatric): Secondary | ICD-10-CM | POA: Diagnosis not present

## 2023-04-29 ENCOUNTER — Other Ambulatory Visit (INDEPENDENT_AMBULATORY_CARE_PROVIDER_SITE_OTHER): Payer: PPO | Admitting: Pharmacist

## 2023-04-29 DIAGNOSIS — H35071 Retinal telangiectasis, right eye: Secondary | ICD-10-CM

## 2023-04-29 MED ORDER — FREESTYLE LIBRE 3 PLUS SENSOR MISC: 3 refills | Status: DC

## 2023-04-29 NOTE — Patient Instructions (Addendum)
Amarri,   It was great talking to you today! Keep up the great work!  When you get the [place] 3 Plus, download the "Axis 3 app".   Continue Ozempic 2 mg weekly and metformin 1 tablet daily.   Please reach out with any questions!  Catie Eppie Gibson, PharmD, BCACP, CPP Clinical Pharmacist [facility] [phone]

## 2023-04-29 NOTE — Progress Notes (Signed)
04/29/2023 Name: Shannon [name] MRN: [id number] DOB: 08/28/1955  Chief Complaint  Patient presents with   Medication Management   Hypertension   Diabetes    Shannon [name] is a 67 y.o. year old female who presented for a telephone visit.   They were referred to the pharmacist by their PCP for assistance in managing diabetes, hypertension, and hyperlipidemia.    Subjective:  Care Team: Primary Care Provider: Arnette Felts, FNP ; Next Scheduled Visit: 06/03/23 Cardiologist: Epifanio Lesches, MD; Next Scheduled Visit: 05/14/23  Medication Access/Adherence  Current Pharmacy:  [facility] - [place], [place] - [address] [place] [address] Phone: [phone] Fax: [phone]  [facility] - [address] [place] - [address] [place] [address] Phone: [phone] Fax: [phone]   Patient reports affordability concerns with their medications: No  Patient reports access/transportation concerns to their pharmacy: No  Patient reports adherence concerns with their medications:  No     Diabetes:  Current medications: Metformin XR 750 mg daily, Ozempic (semaglutide) 2 mg weekly. Patient reports she increased Ozempic to 2 mg dose on 04/26/23.   Current glucose readings: fasting glucose primarily 80s-90s with all readings < 140, post-prandial < 180. Using [employer] 2 meter CGM.  Patient denies hypoglycemic s/sx including dizziness, shakiness, sweating.  Current medication access support: Ozempic through [employer] patient assistance  Hypertension:  Current medications: lisinopril 10 mg daily   Hyperlipidemia/ASCVD Risk Reduction  Current lipid lowering medications: Atorvastatin 40 mg daily   Objective:  Lab Results  Component Value Date   HGBA1C 6.6 (H) 01/28/2023    Lab Results  Component Value Date   CREATININE 0.78 01/29/2023   BUN 20 01/29/2023    NA 137 01/29/2023   K 3.7 01/29/2023   CL 104 01/29/2023   CO2 21 (L) 01/29/2023    Lab Results  Component Value Date   CHOL 205 (H) 01/28/2023   HDL 56 01/28/2023   LDLCALC 105 (H) 01/28/2023   TRIG 258 (H) 01/28/2023   CHOLHDL 3.7 01/28/2023    Medications Reviewed Today     Reviewed by Alden Hipp, RPH-CPP (Pharmacist) on 04/29/23 at 1312  Med List Status: <None>   Medication Order Taking? Sig Documenting Provider Last Dose Status Informant  atorvastatin (LIPITOR) 40 MG tablet [id number] Yes TAKE 1 TABLET BY MOUTH EACH EVENING WITH A MEAL *REFILL REQUESTArnette Felts, FNP Taking Active   Blood Glucose Monitoring Suppl (ONE TOUCH ULTRA MINI) w/Device KIT [id number]  1 each by Does not apply route 4 (four) times daily -  before meals and at bedtime. Arnette Felts, FNP  Active Self  Cholecalciferol (VITAMIN D-3 PO) [id number] Yes Take 2,000 Units by mouth every evening. [provider] Taking Active Self  Continuous Glucose Sensor (FREESTYLE LIBRE 2 SENSOR) [place] [id number] Yes USE TO MONITOR BLOOD SUGAR. CHANGE SENSOR EVERY 14 DAYS. Arnette Felts, FNP Taking Active   glucagon (GLUCAGEN HYPOKIT) 1 MG SOLR injection [id number]  Inject 1 mg into the vein once as needed for up to 1 dose for low blood sugar.  Patient not taking: Reported on 03/18/2023   Arnette Felts, FNP  Active Self  lisinopril (ZESTRIL) 10 MG tablet [id number] Yes TAKE 1 TABLET BY MOUTH EVERY MORNING *REFILL REQUESTArnette Felts, FNP Taking Active   metFORMIN (GLUCOPHAGE-XR) 750 MG 24 hr tablet [id number] Yes TAKE 1 TABLET BY MOUTH EVERY MORNING WITH BREAKFAST *REFILL REQUESTChristell Constant, [place],  FNP Taking Active   Semaglutide, 2 MG/DOSE, 8 MG/3ML SOPN [id number] Yes Inject 2 mg as directed once a week. Arnette Felts, FNP Taking Active               Assessment/Plan:   Diabetes: - Currently controlled based on most recent A1c of 6.6% and home glucose reading at goal - Reviewed goal A1c, goal fasting,  and goal 2 hour post prandial glucose - Recommend to begin checking glucose with Freestyle Libre 3 Plus   - Meets financial criteria for Ozempic patient assistance program through [employer]. Will collaborate with provider, CPhT, and patient to pursue assistance for 2025   Hypertension: - Currently controlled - Recommend to continue lisinopril 10 mg daily    Hyperlipidemia/ASCVD Risk Reduction: - Currently uncontrolled, goal LDL< 70 and anticipate improvement as atorvastatin was increased after last lipid panel - Recommend to continue atorvastatin 40 mg daily   Follow Up with PCP as scheduled  Catie Eppie Gibson, PharmD, BCACP, CPP Clinical Pharmacist [facility] [phone]

## 2023-05-12 NOTE — Progress Notes (Unsigned)
Cardiology Office Note:    Date:  05/14/2023   ID:  Shannon [name], DOB [birth year], MRN [id number]  PCP:  Arnette Felts, FNP  Cardiologist:  None  Electrophysiologist:  None   Referring MD: Dione Booze, MD   Chief Complaint  Patient presents with   Chest Pain    History of Present Illness:    Shannon [name] is a 66 y.o. female with a hx of T2DM, hyperlipidemia, OSA who presents as an ED follow-up for chest pain.  She was seen in the ED on 01/29/2023 with chest pain.  Workup, including troponins x 2, was unremarkable.  She reports she was having left-sided chest pain, along with numbness and tingling down her left arm.  Reports pain was initially sharp but then became tightness in the chest.  Chest pain occurred off and on for a few days.  Reports was worse with movement.  States that she has not been having pain since her ED visit, she does water aerobics 3 days/week, denies any symptoms with this.  She denies any dyspnea, lightheadedness, syncope, lower extremity edema, or palpitations.  She smoked for 20 to 25 years, up to 1 pack/day, quit in early 26s.  Family history includes brother had MI in 15s, father had MI in 31s, and mother had MI in 77s.  She underwent LHC in 2010 which showed normal coronary arteries.    Past Medical History:  Diagnosis Date   COVID-19 virus infection 10/13/2018   Diabetes mellitus without complication (HCC)    Hypercholesteremia    Sleep apnea    Newly diagnosed in September 2021    Past Surgical History:  Procedure Laterality Date   ABDOMINAL HYSTERECTOMY     APPENDECTOMY  1981   CATARACT EXTRACTION Bilateral    CESAREAN SECTION     MYOMECTOMY     TRANSFORAMINAL LUMBAR INTERBODY FUSION (TLIF) WITH PEDICLE SCREW FIXATION 1 LEVEL N/A 07/16/2022   Procedure: TRANSFORAMINAL LUMBAR INTERBODY FUSION (TLIF L4-5);  Surgeon: Venita Lick, MD;  Location: [facility] OR;  Service: Orthopedics;  Laterality: N/A;    Current Medications: Current Meds  Medication  Sig   atorvastatin (LIPITOR) 40 MG tablet TAKE 1 TABLET BY MOUTH EACH EVENING WITH A MEAL *REFILL REQUEST*   Blood Glucose Monitoring Suppl (ONE TOUCH ULTRA MINI) w/Device KIT 1 each by Does not apply route 4 (four) times daily -  before meals and at bedtime.   Cholecalciferol (VITAMIN D-3 PO) Take 2,000 Units by mouth every evening.   Continuous Glucose Sensor (FREESTYLE LIBRE 3 PLUS SENSOR) MISC Change sensor every 15 days.   glucagon (GLUCAGEN HYPOKIT) 1 MG SOLR injection Inject 1 mg into the vein once as needed for up to 1 dose for low blood sugar.   lisinopril (ZESTRIL) 10 MG tablet TAKE 1 TABLET BY MOUTH EVERY MORNING *REFILL REQUEST*   metFORMIN (GLUCOPHAGE-XR) 750 MG 24 hr tablet TAKE 1 TABLET BY MOUTH EVERY MORNING WITH BREAKFAST *REFILL REQUEST*   metoprolol tartrate (LOPRESSOR) 50 MG tablet Take 1 tablet (50 mg total) by mouth once for 1 dose.   Semaglutide, 2 MG/DOSE, 8 MG/3ML SOPN Inject 2 mg as directed once a week.     Allergies:   Patient has no known allergies.   Social History   Socioeconomic History   Marital status: Single    Spouse name: Not on file   Number of children: Not on file   Years of education: Not on file   Highest education level: Not on file  Occupational History   Not on file  Tobacco Use   Smoking status: Former    Current packs/day: 0.00    Types: Cigarettes    Quit date: 2006    Years since quitting: 18.8   Smokeless tobacco: Never   Tobacco comments:    quit 10 yrs  Vaping Use   Vaping status: Never Used  Substance and Sexual Activity   Alcohol use: Yes    Comment: 1 glass of wine sometimes daily    Drug use: Never   Sexual activity: Not on file  Other Topics Concern   Not on file  Social History Narrative   Not on file   Social Determinants of Health   Financial Resource Strain: High Risk (01/21/2023)   Overall Financial Resource Strain (CARDIA)    Difficulty of Paying Living Expenses: Hard  Food Insecurity: No Food Insecurity  (12/05/2022)   Hunger Vital Sign    Worried About Running Out of Food in the Last Year: Never true    Ran Out of Food in the Last Year: Never true  Transportation Needs: No Transportation Needs (12/05/2022)   PRAPARE - Administrator, Civil Service (Medical): No    Lack of Transportation (Non-Medical): No  Physical Activity: Sufficiently Active (12/05/2022)   Exercise Vital Sign    Days of Exercise per Week: 3 days    Minutes of Exercise per Session: 50 min  Stress: No Stress Concern Present (12/05/2022)   [employer] of Occupational Health - Occupational Stress Questionnaire    Feeling of Stress : Not at all  Social Connections: Not on file     Family History: The patient's family history includes Diabetes in her father, mother, and son; Hypertension in her father, mother, and son; Stroke in her father and mother. There is no history of Breast cancer.  ROS:   Please see the history of present illness.     All other systems reviewed and are negative.  EKGs/Labs/Other Studies Reviewed:    The following studies were reviewed today:   EKG:   05/14/23: Normal sinus rhythm, rate 75, no ST abnormality  Recent Labs: 01/28/2023: ALT 19 01/29/2023: BUN 20; Creatinine, Ser 0.78; Hemoglobin 11.5; Platelets 242; Potassium 3.7; Sodium 137  Recent Lipid Panel    Component Value Date/Time   CHOL 205 (H) 01/28/2023 1201   TRIG 258 (H) 01/28/2023 1201   HDL 56 01/28/2023 1201   CHOLHDL 3.7 01/28/2023 1201   CHOLHDL 5.6 09/20/2008 0130   VLDL UNABLE TO CALCULATE IF TRIGLYCERIDE OVER 400 mg/dL 40/34/7425 9563   LDLCALC 105 (H) 01/28/2023 1201    Physical Exam:    VS:  BP (!) 100/56   Pulse 75   Ht 5\' 3"  (1.6 m)   Wt 147 lb (66.7 kg)   SpO2 96%   BMI 26.04 kg/m     Wt Readings from Last 3 Encounters:  05/14/23 147 lb (66.7 kg)  01/28/23 151 lb 9.6 oz (68.8 kg)  12/05/22 140 lb (63.5 kg)     GEN:  Well nourished, well developed in no acute distress HEENT:  Normal NECK: No JVD; No carotid bruits LYMPHATICS: No lymphadenopathy CARDIAC: RRR, no murmurs, rubs, gallops RESPIRATORY:  Clear to auscultation without rales, wheezing or rhonchi  ABDOMEN: Soft, non-tender, non-distended MUSCULOSKELETAL:  No edema; No deformity  SKIN: Warm and dry NEUROLOGIC:  Alert and oriented x 3 PSYCHIATRIC:  Normal affect   ASSESSMENT:    1. Precordial pain   2. Essential hypertension  3. Hyperlipidemia, unspecified hyperlipidemia type    PLAN:    Chest pain: Atypical in description but does have multiple CAD risk factors (age, T2DM, hypertension, hyperlipidemia, family history, tobacco use).  Recommend coronary CTA to evaluate for obstructive CAD.  Will give Lopressor 50 mg prior to study.  Check echocardiogram to rule out structural heart disease  T2DM: On metformin and Ozempic.  A1c 6.6% on 01/28/2023  Hypertension: On lisinopril 10 mg daily.  Appears controlled  Hyperlipidemia: On atorvastatin 40 mg daily.  LDL 105 on 01/28/2023.  Will follow-up results of coronary CTA to guide how aggressive to be in lowering cholesterol  OSA: on CPAP, reports compliance  RTC in 6 months   Medication Adjustments/Labs and Tests Ordered: Current medicines are reviewed at length with the patient today.  Concerns regarding medicines are outlined above.  Orders Placed This Encounter  Procedures   CT CORONARY MORPH W/CTA COR W/SCORE W/CA W/CM &/OR WO/CM   Basic Metabolic Panel (BMET)   EKG 12-Lead   ECHOCARDIOGRAM COMPLETE   Meds ordered this encounter  Medications   metoprolol tartrate (LOPRESSOR) 50 MG tablet    Sig: Take 1 tablet (50 mg total) by mouth once for 1 dose.    Dispense:  1 tablet    Refill:  0    Patient Instructions  Medication Instructions:  Continue all current medications Hold Lisinopril day of your procedure  *If you need a refill on your cardiac medications before your next appointment, please call your pharmacy*   Lab Work: BMET  today If you have labs (blood work) drawn today and your tests are completely normal, you will receive your results only by: MyChart Message (if you have MyChart) OR A paper copy in the mail If you have any lab test that is abnormal or we need to change your treatment, we will call you to review the results.   Testing/Procedures: Echo  Your physician has requested that you have an echocardiogram. Echocardiography is a painless test that uses sound waves to create images of your heart. It provides your doctor with information about the size and shape of your heart and how well your heart's chambers and valves are working. This procedure takes approximately one hour. There are no restrictions for this procedure. Please do NOT wear cologne, perfume, aftershave, or lotions (deodorant is allowed). Please arrive 15 minutes prior to your appointment time.  Please note: We ask at that you not bring children with you during ultrasound (echo/ vascular) testing. Due to room size and safety concerns, children are not allowed in the ultrasound rooms during exams. Our front office staff cannot provide observation of children in our lobby area while testing is being conducted. An adult accompanying a patient to their appointment will only be allowed in the ultrasound room at the discretion of the ultrasound technician under special circumstances. We apologize for any inconvenience.    Follow-Up: At [facility], you and your health needs are our priority.  As part of our continuing mission to provide you with exceptional heart care, we have created designated Provider Care Teams.  These Care Teams include your primary Cardiologist (physician) and Advanced Practice Providers (APPs -  Physician Assistants and Nurse Practitioners) who all work together to provide you with the care you need, when you need it.  We recommend signing up for the patient portal called "MyChart".  Sign up information is provided  on this After Visit Summary.  MyChart is used to connect with patients for  Virtual Visits (Telemedicine).  Patients are able to view lab/test results, encounter notes, upcoming appointments, etc.  Non-urgent messages can be sent to your provider as well.   To learn more about what you can do with MyChart, go to [email or web address].    Your next appointment:   6 month(s)  Provider:   Dr. Bjorn Pippin  Other Instructions   Your cardiac CT will be scheduled at one of the below locations:   [facility] [address] [place], [place] [address] [phone]   If scheduled at [facility], please arrive at the [facility] and Children's Entrance (Entrance C2) of [facility] 30 minutes prior to test start time. You can use the FREE valet parking offered at entrance C (encouraged to control the heart rate for the test)  Proceed to the [facility] Radiology Department (first floor) to check-in and test prep.  All radiology patients and guests should use entrance C2 at [facility], accessed from [facility], even though the hospital's physical address listed is [address]    If scheduled at [facility] or [facility], please arrive 15 mins early for check-in and test prep.  There is spacious parking and easy access to the radiology department from the [facility] entrance. Please enter here and check-in with the desk attendant.   Please follow these instructions carefully (unless otherwise directed):  An IV will be required for this test and Nitroglycerin will be given.    On the Night Before the Test: Be sure to Drink plenty of water. Do not consume any caffeinated/decaffeinated beverages or chocolate 12 hours prior to your test. Do not take any antihistamines 12 hours prior to your test.  On the Day of the Test: Drink plenty of water until 1 hour prior to the  test. Do not eat any food 1 hour prior to test. You may take your regular medications prior to the test.  Take metoprolol (Lopressor) 50 mg  two hours prior to test. If you take Furosemide/Hydrochlorothiazide/Spironolactone, please HOLD on the morning Lisinopril  *  Drink plenty of water. After receiving IV contrast, you may experience a mild flushed feeling. This is normal. On occasion, you may experience a mild rash up to 24 hours after the test. This is not dangerous. If this occurs, you can take Benadryl 25 mg and increase your fluid intake. If you experience trouble breathing, this can be serious. If it is severe call 911 IMMEDIATELY. If it is mild, please call our office. If you take any of these medications: Glipizide/Metformin, Avandament, Glucavance, please do not take 48 hours after completing test unless otherwise instructed.  We will call to schedule your test 2-4 weeks out understanding that some insurance companies will need an authorization prior to the service being performed.   For more information and frequently asked questions, please visit our website : [email or web address]  For non-scheduling related questions, please contact the cardiac imaging nurse navigator should you have any questions/concerns: Cardiac Imaging Nurse Navigators Direct Office Dial: [phone]   For scheduling needs, including cancellations and rescheduling, please call [place], [phone].     Signed, Little Ishikawa, MD  05/14/2023 9:40 AM    [facility]

## 2023-05-14 ENCOUNTER — Encounter: Payer: Self-pay | Admitting: Cardiology

## 2023-05-14 ENCOUNTER — Ambulatory Visit: Payer: PPO | Attending: Cardiology | Admitting: Cardiology

## 2023-05-14 VITALS — BP 100/56 | HR 75 | Ht 63.0 in | Wt 147.0 lb

## 2023-05-14 DIAGNOSIS — E785 Hyperlipidemia, unspecified: Secondary | ICD-10-CM

## 2023-05-14 DIAGNOSIS — R072 Precordial pain: Secondary | ICD-10-CM

## 2023-05-14 DIAGNOSIS — I10 Essential (primary) hypertension: Secondary | ICD-10-CM

## 2023-05-14 DIAGNOSIS — R079 Chest pain, unspecified: Secondary | ICD-10-CM | POA: Diagnosis not present

## 2023-05-14 MED ORDER — METOPROLOL TARTRATE 50 MG PO TABS: 50.0000 mg | ORAL_TABLET | Freq: Once | ORAL | 0 refills | Status: DC

## 2023-05-14 NOTE — Patient Instructions (Addendum)
Medication Instructions:  Continue all current medications Hold Lisinopril day of your procedure  *If you need a refill on your cardiac medications before your next appointment, please call your pharmacy*   Lab Work: BMET today If you have labs (blood work) drawn today and your tests are completely normal, you will receive your results only by: MyChart Message (if you have MyChart) OR A paper copy in the mail If you have any lab test that is abnormal or we need to change your treatment, we will call you to review the results.   Testing/Procedures: Echo  Your physician has requested that you have an echocardiogram. Echocardiography is a painless test that uses sound waves to create images of your heart. It provides your doctor with information about the size and shape of your heart and how well your heart's chambers and valves are working. This procedure takes approximately one hour. There are no restrictions for this procedure. Please do NOT wear cologne, perfume, aftershave, or lotions (deodorant is allowed). Please arrive 15 minutes prior to your appointment time.  Please note: We ask at that you not bring children with you during ultrasound (echo/ vascular) testing. Due to room size and safety concerns, children are not allowed in the ultrasound rooms during exams. Our front office staff cannot provide observation of children in our lobby area while testing is being conducted. An adult accompanying a patient to their appointment will only be allowed in the ultrasound room at the discretion of the ultrasound technician under special circumstances. We apologize for any inconvenience.    Follow-Up: At [facility], you and your health needs are our priority.  As part of our continuing mission to provide you with exceptional heart care, we have created designated Provider Care Teams.  These Care Teams include your primary Cardiologist (physician) and Advanced Practice Providers (APPs  -  Physician Assistants and Nurse Practitioners) who all work together to provide you with the care you need, when you need it.  We recommend signing up for the patient portal called "MyChart".  Sign up information is provided on this After Visit Summary.  MyChart is used to connect with patients for Virtual Visits (Telemedicine).  Patients are able to view lab/test results, encounter notes, upcoming appointments, etc.  Non-urgent messages can be sent to your provider as well.   To learn more about what you can do with MyChart, go to [email or web address].    Your next appointment:   6 month(s)  Provider:   Dr. Bjorn Pippin  Other Instructions   Your cardiac CT will be scheduled at one of the below locations:   [facility] [address] [place], [place] [address] [phone]   If scheduled at [facility], please arrive at the [facility] and Children's Entrance (Entrance C2) of [facility] 30 minutes prior to test start time. You can use the FREE valet parking offered at entrance C (encouraged to control the heart rate for the test)  Proceed to the [facility] Radiology Department (first floor) to check-in and test prep.  All radiology patients and guests should use entrance C2 at [facility], accessed from [facility], even though the hospital's physical address listed is [address]    If scheduled at [facility] or [facility], please arrive 15 mins early for check-in and test prep.  There is spacious parking and easy access to the radiology department from the [facility] Heart and Vascular entrance.  Please enter here and check-in with the desk attendant.   Please follow these instructions carefully (unless otherwise directed):  An IV will be required for this test and Nitroglycerin will be given.    On the Night Before the Test: Be sure to Drink plenty of water. Do not  consume any caffeinated/decaffeinated beverages or chocolate 12 hours prior to your test. Do not take any antihistamines 12 hours prior to your test.  On the Day of the Test: Drink plenty of water until 1 hour prior to the test. Do not eat any food 1 hour prior to test. You may take your regular medications prior to the test.  Take metoprolol (Lopressor) 50 mg  two hours prior to test. If you take Furosemide/Hydrochlorothiazide/Spironolactone, please HOLD on the morning Lisinopril  *  Drink plenty of water. After receiving IV contrast, you may experience a mild flushed feeling. This is normal. On occasion, you may experience a mild rash up to 24 hours after the test. This is not dangerous. If this occurs, you can take Benadryl 25 mg and increase your fluid intake. If you experience trouble breathing, this can be serious. If it is severe call 911 IMMEDIATELY. If it is mild, please call our office. If you take any of these medications: Glipizide/Metformin, Avandament, Glucavance, please do not take 48 hours after completing test unless otherwise instructed.  We will call to schedule your test 2-4 weeks out understanding that some insurance companies will need an authorization prior to the service being performed.   For more information and frequently asked questions, please visit our website : [email or web address]  For non-scheduling related questions, please contact the cardiac imaging nurse navigator should you have any questions/concerns: Cardiac Imaging Nurse Navigators Direct Office Dial: [phone]   For scheduling needs, including cancellations and rescheduling, please call [place], [phone].

## 2023-05-15 LAB — BASIC METABOLIC PANEL
BUN/Creatinine Ratio: 39 — ABNORMAL HIGH (ref 12–28)
BUN: 23 mg/dL (ref 8–27)
CO2: 24 mmol/L (ref 20–29)
Calcium: 9.8 mg/dL (ref 8.7–10.3)
Chloride: 104 mmol/L (ref 96–106)
Creatinine, Ser: 0.59 mg/dL (ref 0.57–1.00)
Glucose: 139 mg/dL — ABNORMAL HIGH (ref 70–99)
Potassium: 4.6 mmol/L (ref 3.5–5.2)
Sodium: 142 mmol/L (ref 134–144)
eGFR: 98 mL/min/{1.73_m2} (ref >59)

## 2023-05-20 DIAGNOSIS — K635 Polyp of colon: Secondary | ICD-10-CM | POA: Diagnosis not present

## 2023-05-20 DIAGNOSIS — Z860102 Personal history of hyperplastic colon polyps: Secondary | ICD-10-CM | POA: Diagnosis not present

## 2023-05-20 DIAGNOSIS — Z1211 Encounter for screening for malignant neoplasm of colon: Secondary | ICD-10-CM | POA: Diagnosis not present

## 2023-05-20 LAB — HM COLONOSCOPY

## 2023-05-29 ENCOUNTER — Telehealth: Payer: Self-pay | Admitting: Pharmacist

## 2023-05-29 NOTE — Progress Notes (Addendum)
Received shipment of Tresiba from [employer]. Patient is no longer on Tresiba.  Received 4 boxes of Ozempic 8 mg/3 mL (2 mg dose). Patient will come pick up today.

## 2023-05-30 ENCOUNTER — Telehealth (HOSPITAL_COMMUNITY): Payer: Self-pay | Admitting: Cardiology

## 2023-05-30 NOTE — Telephone Encounter (Signed)
Patient called and cancelled echocardiogram scheduled for 06/25/23 for reason below:  05/30/2023 11:34 AM ZO:[id number], APRIL  Cancel Rsn: Conflict/Need to Reschedule ([place]. Will call back to reschedule)   Order will be removed from the active echo WQ. If patient calls back to reschedule we will reinstate the order. Thank you.

## 2023-05-31 ENCOUNTER — Other Ambulatory Visit: Payer: Self-pay | Admitting: Pharmacist

## 2023-05-31 NOTE — Progress Notes (Signed)
Pharmacy Medication Assistance Program Note    05/31/2023  Patient ID: Shannon [name], female   DOB: [birth year], 67 y.o.   MRN: [id number]     05/31/2023  Outreach Medication One  Initial Outreach Date (Medication One) 05/31/2023  Manufacturer Medication One [employer] [facility] Ozempic  Dose of Ozempic 2 mg  Type of Sport and exercise psychologist  Date Application Sent to Patient 05/31/2023  Application Items Requested Application  Patient Assistance Determination Approved  Approval Start Date 06/26/2023  Approval End Date 06/24/2024       Catie Eppie Gibson, PharmD, BCACP, CPP Clinical Pharmacist [facility] [phone]

## 2023-06-03 ENCOUNTER — Ambulatory Visit: Payer: HMO | Admitting: Nurse Practitioner

## 2023-06-06 ENCOUNTER — Ambulatory Visit (HOSPITAL_COMMUNITY): Payer: PPO

## 2023-06-25 ENCOUNTER — Other Ambulatory Visit (HOSPITAL_COMMUNITY): Payer: PPO

## 2023-07-04 ENCOUNTER — Ambulatory Visit: Payer: HMO | Admitting: Family Medicine

## 2023-07-07 DIAGNOSIS — G4733 Obstructive sleep apnea (adult) (pediatric): Secondary | ICD-10-CM | POA: Diagnosis not present

## 2023-07-19 ENCOUNTER — Telehealth: Payer: Self-pay

## 2023-07-19 NOTE — Telephone Encounter (Signed)
RECEIVED NOTIFICATION REGARDING SHIPPING AND AUTO ENROLLMENT FOR OZEMPIC([employer] [facility])  NEXT REFILL WILL BE  08/17/2023

## 2023-07-23 ENCOUNTER — Other Ambulatory Visit: Payer: Self-pay | Admitting: Cardiology

## 2023-07-29 ENCOUNTER — Other Ambulatory Visit: Payer: Self-pay

## 2023-07-29 DIAGNOSIS — R072 Precordial pain: Secondary | ICD-10-CM

## 2023-07-31 ENCOUNTER — Ambulatory Visit (HOSPITAL_COMMUNITY): Payer: PPO | Attending: Cardiology

## 2023-07-31 DIAGNOSIS — R072 Precordial pain: Secondary | ICD-10-CM | POA: Insufficient documentation

## 2023-07-31 LAB — ECHOCARDIOGRAM COMPLETE
Area-P 1/2: 4.1 cm2
S' Lateral: 2.2 cm

## 2023-08-01 ENCOUNTER — Encounter: Payer: Self-pay | Admitting: *Deleted

## 2023-08-01 ENCOUNTER — Ambulatory Visit: Payer: PPO | Admitting: Nurse Practitioner

## 2023-08-01 ENCOUNTER — Encounter: Payer: Self-pay | Admitting: Nurse Practitioner

## 2023-08-01 VITALS — BP 100/74 | HR 91 | Temp 98.8°F | Ht 63.0 in | Wt 138.4 lb

## 2023-08-01 DIAGNOSIS — Z23 Encounter for immunization: Secondary | ICD-10-CM | POA: Diagnosis not present

## 2023-08-01 DIAGNOSIS — E113413 Type 2 diabetes mellitus with severe nonproliferative diabetic retinopathy with macular edema, bilateral: Secondary | ICD-10-CM | POA: Diagnosis not present

## 2023-08-01 DIAGNOSIS — E782 Mixed hyperlipidemia: Secondary | ICD-10-CM

## 2023-08-01 NOTE — Progress Notes (Signed)
 Shannon [name], CMA,acting as a neurosurgeon for Shannon Ada, [name].,have documented all relevant documentation on the behalf of Shannon Ada, [name],as directed by  Shannon Ada, [name] while in the presence of Shannon Ada, [name].  Subjective:  Patient ID: Shannon [name] , female    DOB: [birth year] , 68 y.o.   MRN: [id number]  Chief Complaint  Patient presents with   Diabetes    HPI  Patient presents today for a chol and dm follow up, Patient reports compliance with medication. Patient states that she takes her blood sugars daily. Patient denies any chest pain, SOB, or headaches. Patient has no concerns today.   Diabetes She presents for her follow-up diabetic visit. She has type 2 diabetes mellitus. No MedicAlert identification noted. Her disease course has been stable. There are no hypoglycemic associated symptoms. There are no diabetic associated symptoms. There are no hypoglycemic complications. Symptoms are stable. There are no diabetic complications. Risk factors for coronary artery disease include sedentary lifestyle. Current diabetic treatment includes oral agent (monotherapy) and diet. She is compliant with treatment all of the time. Her weight is decreasing steadily. She is following a generally healthy and diabetic diet. When asked about meal planning, she reported none. She rarely participates in exercise. There is no change in her home blood glucose trend. Her breakfast blood glucose range is generally 110-130 mg/dl. Her lunch blood glucose range is generally 110-130 mg/dl. Her dinner blood glucose range is generally 110-130 mg/dl. An ACE inhibitor/angiotensin II receptor blocker is being taken. She does not see a podiatrist.Eye exam is current.     Past Medical History:  Diagnosis Date   COVID-19 virus infection 10/13/2018   Diabetes mellitus without complication (HCC)    Hypercholesteremia    Hypokalemia 10/13/2018   Pyelonephritis 06/17/2018   Sleep apnea    Newly diagnosed in September  2021     Family History  Problem Relation Age of Onset   Hypertension Mother    Diabetes Mother    Stroke Mother    Diabetes Father    Hypertension Father    Stroke Father    Hypertension Son    Diabetes Son    Breast cancer Neg Hx      Current Outpatient Medications:    atorvastatin  (LIPITOR) 40 MG tablet, TAKE 1 TABLET BY MOUTH EACH EVENING WITH A MEAL *REFILL REQUEST*, Disp: 90 tablet, Rfl: 1   Blood Glucose Monitoring Suppl (ONE TOUCH ULTRA MINI) w/Device KIT, 1 each by Does not apply route 4 (four) times daily -  before meals and at bedtime., Disp: 1 kit, Rfl: 0   Cholecalciferol  (VITAMIN D -3 PO), Take 2,000 Units by mouth every evening., Disp: , Rfl:    Continuous Glucose Sensor (FREESTYLE LIBRE 3 PLUS SENSOR) MISC, Change sensor every 15 days., Disp: 6 each, Rfl: 3   glucagon (GLUCAGEN  HYPOKIT) 1 MG SOLR injection, Inject 1 mg into the vein once as needed for up to 1 dose for low blood sugar., Disp: 1 each, Rfl: 5   lisinopril  (ZESTRIL ) 10 MG tablet, TAKE 1 TABLET BY MOUTH EVERY MORNING *REFILL REQUEST*, Disp: 30 tablet, Rfl: 10   metFORMIN  (GLUCOPHAGE -XR) 750 MG 24 hr tablet, TAKE 1 TABLET BY MOUTH EVERY MORNING WITH BREAKFAST *REFILL REQUEST*, Disp: 30 tablet, Rfl: 10   metoprolol  tartrate (LOPRESSOR ) 50 MG tablet, Take 1 tablet (50 mg total) by mouth once for 1 dose., Disp: 1 tablet, Rfl: 0   Semaglutide , 2 MG/DOSE, 8 MG/3ML SOPN, Inject 2 mg as directed once  a week., Disp: , Rfl:    No Known Allergies   Review of Systems  All other systems reviewed and are negative.    Today's Vitals   08/01/23 1547  BP: 100/74  Pulse: 91  Temp: 98.8 F (37.1 C)  TempSrc: Oral  Weight: 138 lb 6.4 oz (62.8 kg)  Height: 5' 3 (1.6 m)  PainSc: 0-No pain   Body mass index is 24.52 kg/m.  Wt Readings from Last 3 Encounters:  08/01/23 138 lb 6.4 oz (62.8 kg)  05/14/23 147 lb (66.7 kg)  01/28/23 151 lb 9.6 oz (68.8 kg)     Objective:  Physical Exam Constitutional:       General: She is not in acute distress.    Appearance: Normal appearance.  HENT:     Head: Normocephalic.     Nose: Nose normal.  Cardiovascular:     Rate and Rhythm: Normal rate and regular rhythm.     Pulses: Normal pulses.     Heart sounds: Normal heart sounds.  Pulmonary:     Effort: Pulmonary effort is normal. No respiratory distress.     Breath sounds: No wheezing.  Musculoskeletal:     Cervical back: Normal range of motion.  Skin:    General: Skin is warm and dry.  Neurological:     General: No focal deficit present.     Mental Status: She is alert.     Cranial Nerves: No cranial nerve deficit.     Motor: No weakness.  Psychiatric:        Mood and Affect: Mood normal.        Behavior: Behavior normal.      Title   Diabetic Foot Exam - detailed Date & Time: 08/01/2023  4:21 PM Diabetic Foot exam was performed with the following findings: Yes  Visual Foot Exam completed.: Yes  Is there a history of foot ulcer?: No Is there a foot ulcer now?: No Is there swelling?: No Is there elevated skin temperature?: No Is there abnormal foot shape?: No Is there a claw toe deformity?: No Are the toenails long?: No Are the toenails thick?: No Are the toenails ingrown?: No Normal Range of Motion?: Yes Is there foot or ankle muscle weakness?: No Do you have pain in calf while walking?: No Are the shoes appropriate in style and fit?: Yes Can the patient see the bottom of their feet?: Yes Pulse Foot Exam completed.: Yes   Right Posterior Tibialis: Present Left posterior Tibialis: Present   Right Dorsalis Pedis: Present Left Dorsalis Pedis: Present     Sensory Foot Exam Completed.: Yes Semmes-Weinstein Monofilament Test + means has sensation and - means no sensation   R Site 1-Great Toe: Pos L Site 1-Great Toe: Pos   R Site 4: Pos L Site 4: Pos   R Site 6: Pos L Site 6: Pos     Image components are not supported.   Image components are not supported. Image  components are not supported.  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 Continue current medications. Diabetic foot exam done  Orders: -     Hemoglobin A1c -     CMP14+EGFR  Mixed hyperlipidemia Assessment & Plan: Cholesterol levels are slightly elevated at last visit, she is to continue her statin.   Orders: -  Lipid panel -     CMP14+EGFR  COVID-19 vaccine administered Assessment & Plan: Covid 19 vaccine given in office observed for 15 minutes without any adverse reaction   Orders: -     Pfizer Comirnaty Covid-19 Vaccine 39yrs & older    Return for keep same next.  Patient was given opportunity to ask questions. Patient verbalized understanding of the plan and was able to repeat key elements of the plan. All questions were answered to their satisfaction.    Shannon Shannon Ada, [name], have reviewed all documentation for this visit. The documentation on 08/01/23 for the exam, diagnosis, procedures, and orders are all accurate and complete.   IF YOU HAVE BEEN REFERRED TO A SPECIALIST, IT MAY TAKE 1-2 WEEKS TO SCHEDULE/PROCESS THE REFERRAL. IF YOU HAVE NOT HEARD FROM US /SPECIALIST IN TWO WEEKS, PLEASE GIVE US  A CALL AT [phone] X [phone].

## 2023-08-02 LAB — LIPID PANEL
Chol/HDL Ratio: 2.3 {ratio} (ref 0.0–4.4)
Cholesterol, Total: 103 mg/dL (ref 100–199)
HDL: 44 mg/dL (ref >39)
LDL Chol Calc (NIH): 40 mg/dL (ref 0–99)
Triglycerides: 102 mg/dL (ref 0–149)
VLDL Cholesterol Cal: 19 mg/dL (ref 5–40)

## 2023-08-02 LAB — CMP14+EGFR
ALT: 16 [IU]/L (ref 0–32)
AST: 12 [IU]/L (ref 0–40)
Albumin: 4.1 g/dL (ref 3.9–4.9)
Alkaline Phosphatase: 109 [IU]/L (ref 44–121)
BUN/Creatinine Ratio: 20 (ref 12–28)
BUN: 10 mg/dL (ref 8–27)
Bilirubin Total: 0.4 mg/dL (ref 0.0–1.2)
CO2: 26 mmol/L (ref 20–29)
Calcium: 9.1 mg/dL (ref 8.7–10.3)
Chloride: 105 mmol/L (ref 96–106)
Creatinine, Ser: 0.5 mg/dL — ABNORMAL LOW (ref 0.57–1.00)
Globulin, Total: 2.6 g/dL (ref 1.5–4.5)
Glucose: 93 mg/dL (ref 70–99)
Potassium: 3.4 mmol/L — ABNORMAL LOW (ref 3.5–5.2)
Sodium: 144 mmol/L (ref 134–144)
Total Protein: 6.7 g/dL (ref 6.0–8.5)
eGFR: 101 mL/min/{1.73_m2} (ref >59)

## 2023-08-02 LAB — HEMOGLOBIN A1C
Est. average glucose Bld gHb Est-mCnc: 134 mg/dL
Hgb A1c MFr Bld: 6.3 % — ABNORMAL HIGH (ref 4.8–5.6)

## 2023-08-02 LAB — POCT ABI - SCREENING FOR PILOT NO CHARGE
Left ABI: 1.03
Right ABI: 1.1

## 2023-08-07 ENCOUNTER — Encounter: Payer: Self-pay | Admitting: Nurse Practitioner

## 2023-08-09 ENCOUNTER — Other Ambulatory Visit (HOSPITAL_COMMUNITY): Payer: PPO

## 2023-08-11 NOTE — Assessment & Plan Note (Signed)
Cholesterol levels are slightly elevated at last visit, she is to continue her statin.

## 2023-08-11 NOTE — Assessment & Plan Note (Signed)
 Covid 19 vaccine given in office observed for 15 minutes without any adverse reaction

## 2023-08-11 NOTE — Assessment & Plan Note (Addendum)
Blood sugars are stable. Continue current medications. Diabetic foot exam done

## 2023-08-15 ENCOUNTER — Ambulatory Visit (HOSPITAL_COMMUNITY): Payer: PPO

## 2023-08-19 ENCOUNTER — Other Ambulatory Visit: Payer: Self-pay | Admitting: Nurse Practitioner

## 2023-08-19 DIAGNOSIS — Z794 Long term (current) use of insulin: Secondary | ICD-10-CM

## 2023-08-21 ENCOUNTER — Telehealth: Payer: Self-pay

## 2023-08-21 NOTE — Telephone Encounter (Signed)
 PLEASE BE ADVISED I RECEIVED NOTIFICATION IN REGARDS TO AUTO REFILL FROM [employer] PT HAS ZERO REFILLS.   I HAVE FAXED THE REFILL REQUEST TO PROVIDER OFFICE OF Arnette Felts FNP.  FOR REVIEW AND SEND BACK TO [employer] [phone]

## 2023-08-22 ENCOUNTER — Encounter (HOSPITAL_COMMUNITY): Payer: Self-pay

## 2023-08-23 ENCOUNTER — Telehealth (HOSPITAL_COMMUNITY): Payer: Self-pay | Admitting: *Deleted

## 2023-08-23 NOTE — Telephone Encounter (Signed)
 Reaching out to patient to offer assistance regarding upcoming cardiac imaging study; pt verbalizes understanding of appt date/time, parking situation and where to check in, pre-test NPO status and medications ordered, and verified current allergies; name and call back number provided for further questions should they arise  Larey Brick RN Navigator Cardiac Imaging [facility] [phone] office [phone] cell   Patient to hold her lisinopril and take 50mg  metoprolol tartrate two hours prior to her cardiac CT scan. She is aware to arrive at 3 PM.

## 2023-08-26 ENCOUNTER — Ambulatory Visit (HOSPITAL_COMMUNITY)
Admission: RE | Admit: 2023-08-26 | Discharge: 2023-08-26 | Disposition: A | Discharge status: Home / Self Care | Payer: PPO | Source: Ambulatory Visit | Attending: Cardiology | Admitting: Cardiology

## 2023-08-26 DIAGNOSIS — R072 Precordial pain: Secondary | ICD-10-CM | POA: Diagnosis not present

## 2023-08-26 DIAGNOSIS — E113411 Type 2 diabetes mellitus with severe nonproliferative diabetic retinopathy with macular edema, right eye: Secondary | ICD-10-CM | POA: Diagnosis not present

## 2023-08-26 DIAGNOSIS — H43392 Other vitreous opacities, left eye: Secondary | ICD-10-CM | POA: Diagnosis not present

## 2023-08-26 DIAGNOSIS — H3589 Other specified retinal disorders: Secondary | ICD-10-CM | POA: Diagnosis not present

## 2023-08-26 DIAGNOSIS — H35071 Retinal telangiectasis, right eye: Secondary | ICD-10-CM | POA: Diagnosis not present

## 2023-08-26 DIAGNOSIS — G4733 Obstructive sleep apnea (adult) (pediatric): Secondary | ICD-10-CM | POA: Diagnosis not present

## 2023-08-26 DIAGNOSIS — E113492 Type 2 diabetes mellitus with severe nonproliferative diabetic retinopathy without macular edema, left eye: Secondary | ICD-10-CM | POA: Diagnosis not present

## 2023-08-26 MED ORDER — NITROGLYCERIN 0.4 MG SL SUBL
0.8000 mg | SUBLINGUAL_TABLET | Freq: Once | SUBLINGUAL | Status: AC
  Administered 2023-08-26: 0.8 mg via SUBLINGUAL

## 2023-08-26 MED ORDER — IOHEXOL 350 MG/ML SOLN
95.0000 mL | Freq: Once | INTRAVENOUS | Status: AC | PRN
  Administered 2023-08-26: 95 mL via INTRAVENOUS

## 2023-08-26 MED ORDER — NITROGLYCERIN 0.4 MG SL SUBL
SUBLINGUAL_TABLET | SUBLINGUAL | Status: AC
  Filled 2023-08-26: qty 2

## 2023-08-26 NOTE — Progress Notes (Signed)
 Patient tolerated CT well. Vital signs stable encourage to drink water throughout day.Reasons explained and verbalized understanding. Ambulated steady gait.

## 2023-09-09 DIAGNOSIS — E113411 Type 2 diabetes mellitus with severe nonproliferative diabetic retinopathy with macular edema, right eye: Secondary | ICD-10-CM | POA: Diagnosis not present

## 2023-09-09 DIAGNOSIS — E113492 Type 2 diabetes mellitus with severe nonproliferative diabetic retinopathy without macular edema, left eye: Secondary | ICD-10-CM | POA: Diagnosis not present

## 2023-09-09 LAB — HM DIABETES EYE EXAM

## 2023-09-11 NOTE — Progress Notes (Unsigned)
 Pt attended 08/02/23 screening event with ABI left was 1.10 and right 1.03. Pt noted at event that he does have a PCP. At event pt did not indicate any SDOH needs. Pt also noted that she is not a smoker but she lives with someone who smokes and listed Medicare as her insurance at the event.   Per chart review pt does have a PCP Arnette Felts; [place] [facility]), insurance, and is a former smoker. Pt's last appt with PCP was 08/01/23 and has an upcoming appt with PCP on 12/11/23 & 01/29/24. Pt does not indicate any SDOH needs at this time.  No additional pt f/u to be scheduled at this time per health equity protocol.

## 2023-09-27 ENCOUNTER — Telehealth: Payer: Self-pay

## 2023-09-27 NOTE — Telephone Encounter (Signed)
 Called patient to inform their  ozempic 2mg  and tresiba patient assistance  is ready for pick up, LVM. My chart message sent.

## 2023-10-06 DIAGNOSIS — G4733 Obstructive sleep apnea (adult) (pediatric): Secondary | ICD-10-CM | POA: Diagnosis not present

## 2023-12-11 ENCOUNTER — Ambulatory Visit: Payer: HMO

## 2023-12-11 VITALS — BP 112/60 | HR 81 | Temp 98.0°F | Ht 63.0 in | Wt 129.6 lb

## 2023-12-11 DIAGNOSIS — Z0000 Encounter for general adult medical examination without abnormal findings: Secondary | ICD-10-CM

## 2023-12-11 NOTE — Progress Notes (Cosign Needed)
 Subjective:   Shannon [name] is a 68 y.o. who presents for a Medicare Wellness preventive visit.  As a reminder, Annual Wellness Visits don't include a physical exam, and some assessments may be limited, especially if this visit is performed virtually. We may recommend an in-person follow-up visit with your provider if needed.  Visit Complete: In person    Persons Participating in Visit: Patient.  AWV Questionnaire: No: Patient Medicare AWV questionnaire was not completed prior to this visit.  Cardiac Risk Factors include: advanced age (>80men, >92 women);diabetes mellitus     Objective:    Today's Vitals   12/11/23 1004  BP: 112/60  Pulse: 81  Temp: 98 F (36.7 C)  TempSrc: Oral  SpO2: 96%  Weight: 129 lb 9.6 oz (58.8 kg)  Height: 5' 3 (1.6 m)   Body mass index is 22.96 kg/m.     12/11/2023   10:08 AM 01/29/2023    1:15 AM 12/05/2022    8:47 AM 07/16/2022    9:17 AM 07/12/2022    8:12 AM 11/30/2021    8:32 AM 11/17/2020    8:42 AM  Advanced Directives  Does Patient Have a Medical Advance Directive? Yes No No No No Yes Yes  Type of Therapist, art of [employer] Power of Worth;Living will  Copy of Healthcare Power of Attorney in Chart? No - copy requested     No - copy requested No - copy requested  Would patient like information on creating a medical advance directive?     No - Patient declined      Current Medications (verified) Outpatient Encounter Medications as of 12/11/2023  Medication Sig   atorvastatin  (LIPITOR) 40 MG tablet TAKE 1 TABLET BY MOUTH IN THE EVENING WITH A MEAL   Blood Glucose Monitoring Suppl (ONE TOUCH ULTRA MINI) w/Device KIT 1 each by Does not apply route 4 (four) times daily -  before meals and at bedtime.   Cholecalciferol  (VITAMIN D -3 PO) Take 2,000 Units by mouth every evening.   Continuous Glucose Sensor (FREESTYLE LIBRE 3 PLUS SENSOR) MISC Change sensor every 15 days.    glucagon (GLUCAGEN  HYPOKIT) 1 MG SOLR injection Inject 1 mg into the vein once as needed for up to 1 dose for low blood sugar.   lisinopril  (ZESTRIL ) 10 MG tablet TAKE 1 TABLET BY MOUTH EVERY MORNING *REFILL REQUEST*   metFORMIN  (GLUCOPHAGE -XR) 750 MG 24 hr tablet TAKE 1 TABLET BY MOUTH EVERY MORNING WITH BREAKFAST *REFILL REQUEST*   Semaglutide , 2 MG/DOSE, 8 MG/3ML SOPN Inject 2 mg as directed once a week.   metoprolol  tartrate (LOPRESSOR ) 50 MG tablet Take 1 tablet (50 mg total) by mouth once for 1 dose. (Patient not taking: Reported on 12/11/2023)   No facility-administered encounter medications on file as of 12/11/2023.    Allergies (verified) Patient has no known allergies.   History: Past Medical History:  Diagnosis Date   COVID-19 virus infection 10/13/2018   Diabetes mellitus without complication (HCC)    Hypercholesteremia    Hypokalemia 10/13/2018   Pyelonephritis 06/17/2018   Sleep apnea    Newly diagnosed in September 2021   Past Surgical History:  Procedure Laterality Date   ABDOMINAL HYSTERECTOMY     APPENDECTOMY  1981   CATARACT EXTRACTION Bilateral    CESAREAN SECTION     MYOMECTOMY     TRANSFORAMINAL LUMBAR INTERBODY FUSION (TLIF) WITH PEDICLE SCREW FIXATION 1 LEVEL N/A 07/16/2022  Procedure: TRANSFORAMINAL LUMBAR INTERBODY FUSION (TLIF L4-5);  Surgeon: Mort Ards, MD;  Location: [facility] OR;  Service: Orthopedics;  Laterality: N/A;   Family History  Problem Relation Age of Onset   Hypertension Mother    Diabetes Mother    Stroke Mother    Diabetes Father    Hypertension Father    Stroke Father    Hypertension Son    Diabetes Son    Breast cancer Neg Hx    Social History   Socioeconomic History   Marital status: Single    Spouse name: Not on file   Number of children: Not on file   Years of education: Not on file   Highest education level: Not on file  Occupational History   Not on file  Tobacco Use   Smoking status: Former    Current packs/day:  0.00    Types: Cigarettes    Quit date: 2006    Years since quitting: 19.4   Smokeless tobacco: Never   Tobacco comments:    quit 10 yrs  Vaping Use   Vaping status: Never Used  Substance and Sexual Activity   Alcohol use: Yes    Comment: 1 glass of wine sometimes daily    Drug use: Never   Sexual activity: Not on file  Other Topics Concern   Not on file  Social History Narrative   Not on file   Social Drivers of Health   Financial Resource Strain: Low Risk  (12/11/2023)   Overall Financial Resource Strain (CARDIA)    Difficulty of Paying Living Expenses: Not hard at all  Food Insecurity: No Food Insecurity (12/11/2023)   Hunger Vital Sign    Worried About Running Out of Food in the Last Year: Never true    Ran Out of Food in the Last Year: Never true  Transportation Needs: No Transportation Needs (12/11/2023)   PRAPARE - Administrator, Civil Service (Medical): No    Lack of Transportation (Non-Medical): No  Physical Activity: Sufficiently Active (12/11/2023)   Exercise Vital Sign    Days of Exercise per Week: 4 days    Minutes of Exercise per Session: 60 min  Stress: No Stress Concern Present (12/11/2023)   [employer] of Occupational Health - Occupational Stress Questionnaire    Feeling of Stress: Not at all  Social Connections: Moderately Integrated (12/11/2023)   Social Connection and Isolation Panel    Frequency of Communication with Friends and Family: More than three times a week    Frequency of Social Gatherings with Friends and Family: Twice a week    Attends Religious Services: More than 4 times per year    Active Member of [employer] or Organizations: Yes    Attends Banker Meetings: More than 4 times per year    Marital Status: Widowed    Tobacco Counseling Counseling given: Not Answered Tobacco comments: quit 10 yrs    Clinical Intake:  Pre-visit preparation completed: Yes  Pain : No/denies pain     Nutritional Status:  BMI of 19-24  Normal Nutritional Risks: None Diabetes: Yes CBG done?: No Did pt. bring in CBG monitor from home?: No  Lab Results  Component Value Date   HGBA1C 6.3 (H) 08/01/2023   HGBA1C 6.6 (H) 01/28/2023   HGBA1C 8.4 (H) 10/01/2022     How often do you need to have someone help you when you read instructions, pamphlets, or other written materials from your doctor or pharmacy?: 1 - Never  Interpreter Needed?: No  Information entered by :: NAllen LPN   Activities of Daily Living     12/11/2023   10:05 AM  In your present state of health, do you have any difficulty performing the following activities:  Hearing? 0  Vision? 0  Difficulty concentrating or making decisions? 0  Walking or climbing stairs? 0  Dressing or bathing? 0  Doing errands, shopping? 0  Preparing Food and eating ? N  Using the Toilet? N  In the past six months, have you accidently leaked urine? N  Do you have problems with loss of bowel control? N  Managing your Medications? N  Managing your Finances? N  Housekeeping or managing your Housekeeping? N    Patient Care Team: Susanna Epley, FNP as PCP - General (General Practice)  I have updated your Care Teams any recent Medical Services you may have received from other providers in the past year.     Assessment:   This is a routine wellness examination for Arnitra.  Hearing/Vision screen Hearing Screening - Comments:: Denies hearing issues Vision Screening - Comments:: Regular eye exams, Dr. Seward Dao   Goals Addressed             This Visit's Progress    Patient Stated       12/11/2023, stay healthy       Depression Screen     12/11/2023   10:09 AM 01/28/2023   11:28 AM 12/05/2022    8:47 AM 10/01/2022   10:26 AM 05/29/2022   10:43 AM 11/30/2021    8:33 AM 11/21/2021   11:08 AM  PHQ 2/9 Scores  PHQ - 2 Score 0 0 0 0 0 0 0  PHQ- 9 Score 0 0     0    Fall Risk     12/11/2023   10:09 AM 01/28/2023   11:27 AM 12/05/2022    8:47 AM 10/01/2022    10:26 AM 05/29/2022   10:43 AM  Fall Risk   Falls in the past year? 0 0 0 0 0  Number falls in past yr: 0 0 0  0  Injury with Fall? 0 0 0  0  Risk for fall due to : Medication side effect No Fall Risks Medication side effect  No Fall Risks  Follow up Falls evaluation completed;Falls prevention discussed Falls evaluation completed;Education provided Falls prevention discussed;Education provided;Falls evaluation completed  Falls evaluation completed      Data saved with a previous flowsheet row definition    MEDICARE RISK AT HOME:  Medicare Risk at Home Any stairs in or around the home?: No If so, are there any without handrails?: No Home free of loose throw rugs in walkways, pet beds, electrical cords, etc?: Yes Adequate lighting in your home to reduce risk of falls?: Yes Life alert?: No Use of a cane, walker or w/c?: No Grab bars in the bathroom?: No Shower chair or bench in shower?: Yes Elevated toilet seat or a handicapped toilet?: No  TIMED UP AND GO:  Was the test performed?  Yes  Length of time to ambulate 10 feet: 5 sec Gait steady and fast without use of assistive device  Cognitive Function: 6CIT completed        12/11/2023   10:10 AM 12/05/2022    8:48 AM 11/30/2021    8:34 AM 11/17/2020    8:44 AM 11/16/2019    2:43 PM  6CIT Screen  What Year? 0 points 0 points 0 points 0 points 0  points  What month? 0 points 0 points 0 points 0 points 0 points  What time? 0 points 0 points 0 points 0 points 0 points  Count back from 20 0 points 0 points 0 points 0 points 0 points  Months in reverse 0 points 0 points 0 points 0 points 0 points  Repeat phrase 0 points 0 points 4 points 2 points 0 points  Total Score 0 points 0 points 4 points 2 points 0 points    Immunizations Immunization History  Administered Date(s) Administered   Fluad Quad(high Dose 65+) 03/22/2020, 06/22/2021, 05/29/2022   Influenza,inj,Quad PF,6+ Mos 04/18/2018   Influenza-Unspecified 05/01/2019,  04/24/2022   PFIZER Comirnaty(Gray Top)Covid-19 Tri-Sucrose Vaccine 01/11/2021   PFIZER(Purple Top)SARS-COV-2 Vaccination 09/18/2019, 10/14/2019, 04/19/2020, 01/11/2021   PNEUMOCOCCAL CONJUGATE-20 09/28/2021   Pfizer Covid-19 Vaccine Bivalent Booster 65yrs & up 10/23/2021   Pfizer(Comirnaty)Fall Seasonal Vaccine 12 years and older 08/01/2023   Pneumococcal Polysaccharide-23 05/30/2018   Tdap 11/28/2017   Zoster Recombinant(Shingrix ) 02/22/2021, 08/15/2021    Screening Tests Health Maintenance  Topic Date Due   INFLUENZA VACCINE  01/24/2024   Diabetic kidney evaluation - Urine ACR  01/28/2024   HEMOGLOBIN A1C  01/29/2024   COVID-19 Vaccine (7 - Pfizer risk 2024-25 season) 01/29/2024   Diabetic kidney evaluation - eGFR measurement  07/31/2024   FOOT EXAM  07/31/2024   OPHTHALMOLOGY EXAM  09/08/2024   Medicare Annual Wellness (AWV)  12/10/2024   MAMMOGRAM  02/18/2025   DTaP/Tdap/Td (2 - Td or Tdap) 11/29/2027   Colonoscopy  05/19/2033   Pneumococcal Vaccine: 50+ Years  Completed   DEXA SCAN  Completed   Hepatitis C Screening  Completed   Zoster Vaccines- Shingrix   Completed   HPV VACCINES  Aged Out   Meningococcal B Vaccine  Aged Out    Health Maintenance  There are no preventive care reminders to display for this patient.  Health Maintenance Items Addressed: Up to date  Additional Screening:  Vision Screening: Recommended annual ophthalmology exams for early detection of glaucoma and other disorders of the eye. Would you like a referral to an eye doctor? No    Dental Screening: Recommended annual dental exams for proper oral hygiene  Community Resource Referral / Chronic Care Management: CRR required this visit?  No   CCM required this visit?  No   Plan:    I have personally reviewed and noted the following in the patient's chart:   Medical and social history Use of alcohol, tobacco or illicit drugs  Current medications and supplements including opioid  prescriptions. Patient is not currently taking opioid prescriptions. Functional ability and status Nutritional status Physical activity Advanced directives List of other physicians Hospitalizations, surgeries, and ER visits in previous 12 months Vitals Screenings to include cognitive, depression, and falls Referrals and appointments  In addition, I have reviewed and discussed with patient certain preventive protocols, quality metrics, and best practice recommendations. A written personalized care plan for preventive services as well as general preventive health recommendations were provided to patient.   Areatha Beecham, LPN   09/24/270   After Visit Summary: (In Person-Printed) AVS printed and given to the patient  Notes: Nothing significant to report at this time.

## 2023-12-11 NOTE — Patient Instructions (Signed)
 Shannon [name] , Thank you for taking time out of your busy schedule to complete your Annual Wellness Visit with me. I enjoyed our conversation and look forward to speaking with you again next year. I, as well as your care team,  appreciate your ongoing commitment to your health goals. Please review the following plan we discussed and let me know if I can assist you in the future. Your Game plan/ To Do List    Referrals: If you haven't heard from the office you've been referred to, please reach out to them at the phone provided.  N/a Follow up Visits: Next Medicare AWV with our clinical staff: 01/27/2025 at 10:20   Have you seen your provider in the last 6 months (3 months if uncontrolled diabetes)? Yes Next Office Visit with your provider: 01/29/2024 at 11:00  Clinician Recommendations:  Aim for 30 minutes of exercise or brisk walking, 6-8 glasses of water , and 5 servings of fruits and vegetables each day.       This is a list of the screening recommended for you and due dates:  Health Maintenance  Topic Date Due   Flu Shot  01/24/2024   Yearly kidney health urinalysis for diabetes  01/28/2024   Hemoglobin A1C  01/29/2024   COVID-19 Vaccine (7 - Pfizer risk 2024-25 season) 01/29/2024   Yearly kidney function blood test for diabetes  07/31/2024   Complete foot exam   07/31/2024   Eye exam for diabetics  09/08/2024   Medicare Annual Wellness Visit  12/10/2024   Mammogram  02/18/2025   DTaP/Tdap/Td vaccine (2 - Td or Tdap) 11/29/2027   Colon Cancer Screening  05/19/2033   Pneumococcal Vaccine for age over 30  Completed   DEXA scan (bone density measurement)  Completed   Hepatitis C Screening  Completed   Zoster (Shingles) Vaccine  Completed   HPV Vaccine  Aged Out   Meningitis B Vaccine  Aged Out    Advanced directives: (Copy Requested) Please bring a copy of your health care power of attorney and living will to the office to be added to your chart at your convenience. You can mail to [facility] [address] [place], [place] [address] or email to [email or web address] Advance Care Planning is important because it:  [x]  Makes sure you receive the medical care that is consistent with your values, goals, and preferences  [x]  It provides guidance to your family and loved ones and reduces their decisional burden about whether or not they are making the right decisions based on your wishes.  Follow the link provided in your after visit summary or read over the paperwork we have mailed to you to help you started getting your Advance Directives in place. If you need assistance in completing these, please reach out to us  so that we can help you!  See attachments for Preventive Care and Fall Prevention Tips.

## 2023-12-16 ENCOUNTER — Other Ambulatory Visit: Payer: Self-pay | Admitting: Nurse Practitioner

## 2023-12-16 DIAGNOSIS — E119 Type 2 diabetes mellitus without complications: Secondary | ICD-10-CM

## 2024-01-05 DIAGNOSIS — G4733 Obstructive sleep apnea (adult) (pediatric): Secondary | ICD-10-CM | POA: Diagnosis not present

## 2024-01-06 DIAGNOSIS — E113411 Type 2 diabetes mellitus with severe nonproliferative diabetic retinopathy with macular edema, right eye: Secondary | ICD-10-CM | POA: Diagnosis not present

## 2024-01-06 DIAGNOSIS — G4733 Obstructive sleep apnea (adult) (pediatric): Secondary | ICD-10-CM | POA: Diagnosis not present

## 2024-01-06 DIAGNOSIS — E113492 Type 2 diabetes mellitus with severe nonproliferative diabetic retinopathy without macular edema, left eye: Secondary | ICD-10-CM | POA: Diagnosis not present

## 2024-01-06 DIAGNOSIS — H35033 Hypertensive retinopathy, bilateral: Secondary | ICD-10-CM | POA: Diagnosis not present

## 2024-01-06 DIAGNOSIS — H35071 Retinal telangiectasis, right eye: Secondary | ICD-10-CM | POA: Diagnosis not present

## 2024-01-06 DIAGNOSIS — H3589 Other specified retinal disorders: Secondary | ICD-10-CM | POA: Diagnosis not present

## 2024-01-06 DIAGNOSIS — H43392 Other vitreous opacities, left eye: Secondary | ICD-10-CM | POA: Diagnosis not present

## 2024-01-22 NOTE — Progress Notes (Signed)
 PATIENT: Shannon [name] DOB: 1955-11-20  REASON FOR VISIT: follow up HISTORY FROM: patient  Chief Complaint  Patient presents with   Follow-up    Pt in room 1. Alone. Here for cpap follow up. Pt works 7pm-7am states she went out of town and left her cpap machine.      HISTORY OF PRESENT ILLNESS:  02/03/24 ALL:  Shannon [name] returns for follow up for OSA on CPAP. She continues to use CPAP but admits to lower compliance. She recently returned to working nights. She reports change in routine has contributed to lower usage. She does note improvement in sleep quality when using therapy. She denies concerns with machine or supplies. She is motivated to continue therapy.     06/28/2022 ALL:  Shannon [name] returns for follow up for OSA on CPAP. She reports doing well. She is using CPAP most nights. She had a death in the family last week and has not used her machine since traveling. She also admits falling asleep in her recliner before bed. She does note benefit of using CPAP. She denies concerns with machine or supplies.     12/21/2021 ALL: Shannon [name] returns for follow up for OSA on CPAP. She reports that she is having difficulty with compliance. 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 She admits that she has not put therapy on when she gets home. She is napping during the day. She denies difficulty with machine or supplies. She is having significant back pain with shooting pain in left left. She is followed by PCP and ortho.     12/15/2020 ALL: Shannon [name] is a 68 y.o. female here today for follow up for OSA on CPAP. She is doing well with new mask. She has not had any difficulty using CPAP. She does feel a little better rested in the mornings.        08/29/2020 SA: Shannon [name] is a 68 year old right-handed woman with an underlying medical history of hypertension, hyperlipidemia, diabetes, diabetic retinopathy and overweight state, who presents for follow-up consultation of her obstructive sleep  apnea after interim testing and starting AutoPap therapy.  The patient is unaccompanied today.  I first met her at the request of her primary care nurse practitioner on 01/06/2020, at which time she reported snoring and excessive daytime somnolence.  She was encouraged to seek evaluation for sleep apnea by her retina specialist who had noted macular edema and felt it could be in part secondary to underlying sleep apnea.  She was advised to proceed with sleep testing.  She had a home sleep test on 03/02/2020 which indicated overall mild sleep apnea with an AHI of 7.7/h, O2 nadir of 89%.  Given her medical history and daytime somnolence reported she was encouraged to start AutoPap therapy.  Set up date was 05/10/20.   Today, 08/29/20: I reviewed her AutoPap compliance data from 07/26/2020 through 08/24/2020, which is a total of 30 days, during which time she used her machine only 14 days with percent use days greater than 4 hours at only 3%, indicating low compliance, average AHI 11.6/h, 95th percentile of pressure at 5.5 cm, leak for the 95th percentile at 13.7 L/min, pressure range of 5 to 11 cm with EPR of 3.   I reviewed her AutoPap compliance data from 06/26/2020 through 07/25/2020, which is a total of 30 days, during which time she used her machine 24 days with percent use days greater than 4 hours at 40%, indicating suboptimal compliance  with an average usage of 2 hours and 50 minutes for days on treatment, residual AHI 4.2/h, 95th percentile of pressure at 8.4 cm with a range of 5 to 11 cm, leak on the higher side with a 95th percentile at 17.5 L/min.  Her compliance in the month of late November through late December was similar, percent use days greater than 4 hours was 33%.  She reports that she works third shift.  She tries to keep the mask on at least 4 hours daily.  She generally sleeps between 1 and 6 PM.  She has to be at work at 7 PM and works 12 hours.  She reports that she feels a little better, sleep  quality and energy are a little better.  She reports that she had an eye examination and her right eye looks a little better.  I reviewed the note from Dr. Arley Ruder from 08/17/2020.  She is motivated to continue with treatment.  She reports using a nasal mask.  She does breathe at times through her mouth and is wondering if she could try a full facemask.  She talked to [facility] regarding her low readings messages on her machine and she was instructed to troubleshoot at home by unplugging and restarting.  She still gets the low readings.   REVIEW OF SYSTEMS: Out of a complete 14 system review of symptoms, the patient complains only of the following symptoms, none and all other reviewed systems are negative.  ESS: 12/24, previously 4  ALLERGIES: No Known Allergies  HOME MEDICATIONS: Outpatient Medications Prior to Visit  Medication Sig Dispense Refill   atorvastatin  (LIPITOR) 40 MG tablet TAKE 1 TABLET BY MOUTH IN THE EVENING WITH A MEAL 30 tablet 10   Blood Glucose Monitoring Suppl (ONE TOUCH ULTRA MINI) w/Device KIT 1 each by Does not apply route 4 (four) times daily -  before meals and at bedtime. 1 kit 0   cholecalciferol  (VITAMIN D3) 25 MCG (1000 UNIT) tablet Take 1,000 Units by mouth daily.     Continuous Glucose Sensor (FREESTYLE LIBRE 3 PLUS SENSOR) MISC Change sensor every 15 days. 6 each 3   glucagon (GLUCAGEN  HYPOKIT) 1 MG SOLR injection Inject 1 mg into the vein once as needed for up to 1 dose for low blood sugar. 1 each 5   lisinopril  (ZESTRIL ) 10 MG tablet Take 1 tablet (10 mg total) by mouth every morning. 30 tablet 11   metFORMIN  (GLUCOPHAGE -XR) 750 MG 24 hr tablet Take 1 tablet (750 mg total) by mouth daily with breakfast. 30 tablet 11   Semaglutide , 2 MG/DOSE, 8 MG/3ML SOPN Inject 2 mg as directed once a week.     Cholecalciferol  (VITAMIN D -3 PO) Take 2,000 Units by mouth every evening.     metoprolol  tartrate (LOPRESSOR ) 50 MG tablet Take 1 tablet (50 mg total) by mouth once  for 1 dose. (Patient not taking: Reported on 01/29/2024) 1 tablet 0   No facility-administered medications prior to visit.    PAST MEDICAL HISTORY: Past Medical History:  Diagnosis Date   COVID-19 virus infection 10/13/2018   Diabetes mellitus without complication (HCC)    Hypercholesteremia    Hypokalemia 10/13/2018   Pyelonephritis 06/17/2018   Sleep apnea    Newly diagnosed in September 2021    PAST SURGICAL HISTORY: Past Surgical History:  Procedure Laterality Date   ABDOMINAL HYSTERECTOMY     APPENDECTOMY  1981   CATARACT EXTRACTION Bilateral    CESAREAN SECTION  MYOMECTOMY     TRANSFORAMINAL LUMBAR INTERBODY FUSION (TLIF) WITH PEDICLE SCREW FIXATION 1 LEVEL N/A 07/16/2022   Procedure: TRANSFORAMINAL LUMBAR INTERBODY FUSION (TLIF L4-5);  Surgeon: Burnetta Aures, MD;  Location: [facility] OR;  Service: Orthopedics;  Laterality: N/A;    FAMILY HISTORY: Family History  Problem Relation Age of Onset   Hypertension Mother    Diabetes Mother    Stroke Mother    Diabetes Father    Hypertension Father    Stroke Father    Hypertension Son    Diabetes Son    Breast cancer Neg Hx     SOCIAL HISTORY: Social History   Socioeconomic History   Marital status: Single    Spouse name: Not on file   Number of children: Not on file   Years of education: Not on file   Highest education level: Not on file  Occupational History   Not on file  Tobacco Use   Smoking status: Former    Current packs/day: 0.00    Types: Cigarettes    Quit date: 2006    Years since quitting: 19.6   Smokeless tobacco: Never   Tobacco comments:    quit 10 yrs  Vaping Use   Vaping status: Never Used  Substance and Sexual Activity   Alcohol use: Yes    Comment: 1 glass of wine sometimes daily    Drug use: Never   Sexual activity: Not on file  Other Topics Concern   Not on file  Social History Narrative   Not on file   Social Drivers of Health   Financial Resource Strain: Low Risk  (12/11/2023)    Overall Financial Resource Strain (CARDIA)    Difficulty of Paying Living Expenses: Not hard at all  Food Insecurity: No Food Insecurity (12/11/2023)   Hunger Vital Sign    Worried About Running Out of Food in the Last Year: Never true    Ran Out of Food in the Last Year: Never true  Transportation Needs: No Transportation Needs (12/11/2023)   PRAPARE - Administrator, Civil Service (Medical): No    Lack of Transportation (Non-Medical): No  Physical Activity: Sufficiently Active (12/11/2023)   Exercise Vital Sign    Days of Exercise per Week: 4 days    Minutes of Exercise per Session: 60 min  Stress: No Stress Concern Present (12/11/2023)   [employer] of Occupational Health - Occupational Stress Questionnaire    Feeling of Stress: Not at all  Social Connections: Moderately Integrated (12/11/2023)   Social Connection and Isolation Panel    Frequency of Communication with Friends and Family: More than three times a week    Frequency of Social Gatherings with Friends and Family: Twice a week    Attends Religious Services: More than 4 times per year    Active Member of [employer] or Organizations: Yes    Attends Banker Meetings: More than 4 times per year    Marital Status: Widowed  Intimate Partner Violence: Not At Risk (12/11/2023)   Humiliation, Afraid, Rape, and Kick questionnaire    Fear of Current or Ex-Partner: No    Emotionally Abused: No    Physically Abused: No    Sexually Abused: No     PHYSICAL EXAM  Vitals:   02/03/24 1251  BP: 100/69  Weight: 126 lb 8 oz (57.4 kg)  Height: 5' 3 (1.6 m)     Body mass index is 22.41 kg/m.  Generalized: Well developed, in no acute  distress  Cardiology: normal rate and rhythm, no murmur noted Respiratory: clear to auscultation bilaterally  Neurological examination  Mentation: Alert oriented to time, place, history taking. Follows all commands speech and language fluent Cranial nerve II-XII: Pupils  were equal round reactive to light. Extraocular movements were full, visual field were full  Motor: The motor testing reveals 5 over 5 strength of all 4 extremities. Good symmetric motor tone is noted throughout.  Gait and station: Gait is guarded, left limp, stable without assistive device    DIAGNOSTIC DATA (LABS, IMAGING, TESTING) - I reviewed patient records, labs, notes, testing and imaging myself where available.      No data to display           Lab Results  Component Value Date   WBC 5.0 01/29/2024   HGB 11.6 01/29/2024   HCT 37.1 01/29/2024   MCV 88 01/29/2024   PLT 215 01/29/2024      Component Value Date/Time   NA 140 01/29/2024 1207   K 4.6 01/29/2024 1207   CL 101 01/29/2024 1207   CO2 23 01/29/2024 1207   GLUCOSE 100 (H) 01/29/2024 1207   GLUCOSE 199 (H) 01/29/2023 0127   BUN 24 01/29/2024 1207   CREATININE 0.57 01/29/2024 1207   CALCIUM  9.9 01/29/2024 1207   PROT 7.5 01/29/2024 1207   ALBUMIN  4.8 01/29/2024 1207   AST 10 01/29/2024 1207   ALT 13 01/29/2024 1207   ALKPHOS 114 01/29/2024 1207   BILITOT 0.4 01/29/2024 1207   GFRNONAA >60 01/29/2023 0127   GFRAA 107 05/23/2020 1139   Lab Results  Component Value Date   CHOL 182 01/29/2024   HDL 69 01/29/2024   LDLCALC 96 01/29/2024   TRIG 97 01/29/2024   CHOLHDL 2.6 01/29/2024   Lab Results  Component Value Date   HGBA1C 6.0 (H) 01/29/2024   Lab Results  Component Value Date   VITAMINB12 1,789 (H) 09/28/2021   Lab Results  Component Value Date   TSH 0.587 09/28/2021     ASSESSMENT AND PLAN 68 y.o. year old female  has a past medical history of COVID-19 virus infection (10/13/2018), Diabetes mellitus without complication (HCC), Hypercholesteremia, Hypokalemia (10/13/2018), Pyelonephritis (06/17/2018), and Sleep apnea. here with     ICD-10-CM   1. OSA on CPAP  G47.33 For home use only DME continuous positive airway pressure (CPAP)      Shannon [name] is doing well on CPAP therapy.  Compliance report reveals sub optimal compliance. She feels this is related to working nights. She was encouraged to continue using CPAP nightly and for greater than 4 hours each night. We will update supply orders as indicated. Risks of untreated sleep apnea review and education materials provided.She was encouraged to follow up closely with PCP. Healthy lifestyle habits encouraged. She will follow up in 1 year, sooner if needed. She  verbalizes understanding and agreement with this plan.    Orders Placed This Encounter  Procedures   For home use only DME continuous positive airway pressure (CPAP)    Heated Humidity with all supplies as needed    Length of Need:   Lifetime    Patient has OSA or probable OSA:   Yes    Is the patient currently using CPAP in the home:   Yes    Settings:   Other see comments    CPAP supplies needed:   Mask, headgear, cushions, filters, heated tubing and water  chamber      No orders of the  defined types were placed in this encounter.      Greig Forbes, FNP-C 02/03/2024, 4:08 PM [facility] [address], [address] [place], [place] [address] [phone]

## 2024-01-22 NOTE — Patient Instructions (Signed)

## 2024-01-29 ENCOUNTER — Ambulatory Visit (INDEPENDENT_AMBULATORY_CARE_PROVIDER_SITE_OTHER): Payer: HMO | Admitting: Nurse Practitioner

## 2024-01-29 ENCOUNTER — Encounter: Payer: Self-pay | Admitting: Nurse Practitioner

## 2024-01-29 ENCOUNTER — Other Ambulatory Visit: Payer: Self-pay | Admitting: Nurse Practitioner

## 2024-01-29 VITALS — BP 130/80 | HR 88 | Temp 97.8°F | Ht 63.0 in | Wt 128.8 lb

## 2024-01-29 DIAGNOSIS — Z79899 Other long term (current) drug therapy: Secondary | ICD-10-CM

## 2024-01-29 DIAGNOSIS — Z0000 Encounter for general adult medical examination without abnormal findings: Secondary | ICD-10-CM

## 2024-01-29 DIAGNOSIS — E113413 Type 2 diabetes mellitus with severe nonproliferative diabetic retinopathy with macular edema, bilateral: Secondary | ICD-10-CM | POA: Diagnosis not present

## 2024-01-29 DIAGNOSIS — E782 Mixed hyperlipidemia: Secondary | ICD-10-CM

## 2024-01-29 DIAGNOSIS — L84 Corns and callosities: Secondary | ICD-10-CM

## 2024-01-29 DIAGNOSIS — R82998 Other abnormal findings in urine: Secondary | ICD-10-CM

## 2024-01-29 DIAGNOSIS — H35033 Hypertensive retinopathy, bilateral: Secondary | ICD-10-CM | POA: Diagnosis not present

## 2024-01-29 DIAGNOSIS — R202 Paresthesia of skin: Secondary | ICD-10-CM

## 2024-01-29 LAB — POCT URINALYSIS DIP (CLINITEK)
Bilirubin, UA: NEGATIVE
Glucose, UA: NEGATIVE mg/dL
Ketones, POC UA: NEGATIVE mg/dL
Nitrite, UA: NEGATIVE
POC PROTEIN,UA: NEGATIVE
Spec Grav, UA: 1.02 (ref 1.010–1.025)
Urobilinogen, UA: 0.2 U/dL
pH, UA: 7 (ref 5.0–8.0)

## 2024-01-29 MED ORDER — FREESTYLE LIBRE 3 PLUS SENSOR MISC: 3 refills | Status: DC

## 2024-01-29 NOTE — Telephone Encounter (Signed)
 Copied from CRM #[id number]. Topic: Clinical - Medication Refill >> Jan 29, 2024  9:37 AM Wess RAMAN wrote: Medication: Continuous Glucose Sensor (FREESTYLE LIBRE 3 PLUS SENSOR) MISC   Has the patient contacted their pharmacy? Yes (Agent: If no, request that the patient contact the pharmacy for the refill. If patient does not wish to contact the pharmacy document the reason why and proceed with request.) (Agent: If yes, when and what did the pharmacy advise?)  This is the patient's preferred pharmacy:   [facility] [place]  [place], [place] - [address] [place] [address] Phone: [phone] Fax: [phone]  Is this the correct pharmacy for this prescription? Yes If no, delete pharmacy and type the correct one.   Has the prescription been filled recently? Yes  Is the patient out of the medication? Yes  Has the patient been seen for an appointment in the last year OR does the patient have an upcoming appointment? Yes  Can we respond through MyChart? Yes  Agent: Please be advised that Rx refills may take up to 3 business days. We ask that you follow-up with your pharmacy.

## 2024-01-29 NOTE — Assessment & Plan Note (Signed)
 Behavior modifications discussed and diet history reviewed.   Pt will continue to exercise regularly and modify diet with low GI, plant based foods and decrease intake of processed foods.  Recommend intake of daily multivitamin, Vitamin D , and calcium .  Recommend mammogram (she will call for her appt) and colonoscopy for preventive screenings, as well as recommend immunizations that include influenza, TDAP, and Shingles Routine wellness visit with recent travel and weight loss related to the types of food she was eating. Improved blood sugar, regular exercise, healthy diet, and improved eyesight reported.

## 2024-01-29 NOTE — Assessment & Plan Note (Signed)
 Difficult to palpate dorsalis pedis pulses and has tingling sensations, also has decreased sensations with diabetic foot exam. Will send for arterial doppler with ABI

## 2024-01-29 NOTE — Assessment & Plan Note (Addendum)
 Blood pressure is fairly controlled, continue current medications. This was noted on her eye exam by the retina specialist. EKG done with NSR HR 71

## 2024-01-29 NOTE — Progress Notes (Signed)
 [name], CMA,acting as a Neurosurgeon for Shannon Ada, FNP.,have documented all relevant documentation on the behalf of Shannon Ada, FNP,as directed by  Shannon Ada, FNP while in the presence of Shannon Ada, FNP.  Subjective:    Patient ID: Shannon [name] , female    DOB: [birth year] , 68 y.o.   MRN: [id number]  Chief Complaint  Patient presents with   Annual Exam    Patient presents today for HM, Patient reports compliance with medication. Patient denies any chest pain, SOB, or headaches. Patient has no concerns today.     HPI Discussed the use of AI scribe software for clinical note transcription with the patient, who gave verbal consent to proceed.  History of Present Illness MARGERITE [name] is a 68 year old female who presents for an annual physical exam.  She has not seen any healthcare providers since her last visit, except for an ophthalmologist. Her vision is reportedly improving with no more bleeding or swelling behind the eyes.  She engages in regular physical activity and describes her diet as 'pretty good'. She recently traveled to [place] for six weeks, where she lost a significant amount of weight due to the natural food available there. She feels great and her blood sugar levels have been stable.  She has a history of diabetes and experiences occasional numbness or tingling in her feet, especially when it rains. There is no pain when walking. She denies constipation or diarrhea.  She sometimes experiences tingling in her feet.   She seen Dr. Elner for her eyes - improvement, no further bleeding   Diabetes She presents for her follow-up diabetic visit. She has type 2 diabetes mellitus. No MedicAlert identification noted. Her disease course has been stable. There are no hypoglycemic associated symptoms. Pertinent negatives for hypoglycemia include no confusion, dizziness or nervousness/anxiousness. There are no diabetic associated symptoms. Pertinent negatives for  diabetes include no fatigue, no polydipsia, no polyphagia and no polyuria. There are no hypoglycemic complications. Symptoms are stable. There are no diabetic complications. Risk factors for coronary artery disease include sedentary lifestyle. Current diabetic treatment includes oral agent (monotherapy) and diet. She is compliant with treatment all of the time. Her weight is decreasing steadily. She is following a generally healthy and diabetic diet. When asked about meal planning, she reported none. She rarely participates in exercise. There is no change in her home blood glucose trend. Her dinner blood glucose range is generally 130-140 mg/dl. An ACE inhibitor/angiotensin II receptor blocker is being taken. She does not see a podiatrist.Eye exam is current.     Past Medical History:  Diagnosis Date   COVID-19 virus infection 10/13/2018   Diabetes mellitus without complication (HCC)    Hypercholesteremia    Hypokalemia 10/13/2018   Pyelonephritis 06/17/2018   Sleep apnea    Newly diagnosed in September 2021     Family History  Problem Relation Age of Onset   Hypertension Mother    Diabetes Mother    Stroke Mother    Diabetes Father    Hypertension Father    Stroke Father    Hypertension Son    Diabetes Son    Breast cancer Neg Hx      Current Outpatient Medications:    atorvastatin  (LIPITOR) 40 MG tablet, TAKE 1 TABLET BY MOUTH IN THE EVENING WITH A MEAL, Disp: 30 tablet, Rfl: 10   Blood Glucose Monitoring Suppl (ONE TOUCH ULTRA MINI) w/Device KIT, 1 each by Does not apply route 4 (four)  times daily -  before meals and at bedtime., Disp: 1 kit, Rfl: 0   Cholecalciferol  (VITAMIN D -3 PO), Take 2,000 Units by mouth every evening., Disp: , Rfl:    Continuous Glucose Sensor (FREESTYLE LIBRE 3 PLUS SENSOR) MISC, Change sensor every 15 days., Disp: 6 each, Rfl: 3   glucagon (GLUCAGEN  HYPOKIT) 1 MG SOLR injection, Inject 1 mg into the vein once as needed for up to 1 dose for low blood  sugar., Disp: 1 each, Rfl: 5   lisinopril  (ZESTRIL ) 10 MG tablet, TAKE 1 TABLET BY MOUTH EVERY MORNING, Disp: 30 tablet, Rfl: 11   metFORMIN  (GLUCOPHAGE -XR) 750 MG 24 hr tablet, TAKE 1 TABLET BY MOUTH EVERY MORNING WITH BREAKFAST, Disp: 30 tablet, Rfl: 11   Semaglutide , 2 MG/DOSE, 8 MG/3ML SOPN, Inject 2 mg as directed once a week., Disp: , Rfl:    metoprolol  tartrate (LOPRESSOR ) 50 MG tablet, Take 1 tablet (50 mg total) by mouth once for 1 dose. (Patient not taking: Reported on 01/29/2024), Disp: 1 tablet, Rfl: 0   No Known Allergies    The patient states she uses status post hysterectomy for birth control. No LMP recorded. Patient has had a hysterectomy.. Negative for Dysmenorrhea and Negative for Menorrhagia. Negative for: breast discharge, breast lump(s), breast pain and breast self exam. Associated symptoms include abnormal vaginal bleeding. Pertinent negatives include abnormal bleeding (hematology), anxiety, decreased libido, depression, difficulty falling sleep, dyspareunia, history of infertility, nocturia, sexual dysfunction, sleep disturbances, urinary incontinence, urinary urgency, vaginal discharge and vaginal itching. Diet regular. She was out of the country for 6 weeks and she lost weight during that time.  The patient states her exercise level is moderate with water  aerobics/floor aerobics 5 days a week.   The patient's tobacco use is:  Social History   Tobacco Use  Smoking Status Former   Current packs/day: 0.00   Types: Cigarettes   Quit date: 2006   Years since quitting: 19.6  Smokeless Tobacco Never  Tobacco Comments   quit 10 yrs  . She has been exposed to passive smoke. The patient's alcohol use is:  Social History   Substance and Sexual Activity  Alcohol Use Yes   Comment: 1 glass of wine sometimes daily      Review of Systems  Constitutional: Negative.  Negative for fatigue.  HENT: Negative.    Eyes: Negative.   Respiratory: Negative.    Cardiovascular:  Negative.   Gastrointestinal: Negative.   Endocrine: Negative.  Negative for polydipsia, polyphagia and polyuria.  Genitourinary: Negative.   Musculoskeletal: Negative.   Skin: Negative.   Allergic/Immunologic: Negative.   Neurological:  Negative for dizziness.  Hematological: Negative.   Psychiatric/Behavioral: Negative.  Negative for confusion. The patient is not nervous/anxious.      Today's Vitals   01/29/24 1105  BP: 130/80  Pulse: 88  Temp: 97.8 F (36.6 C)  TempSrc: Oral  Weight: 128 lb 12.8 oz (58.4 kg)  Height: 5' 3 (1.6 m)  PainSc: 0-No pain   Body mass index is 22.82 kg/m.  Wt Readings from Last 3 Encounters:  01/29/24 128 lb 12.8 oz (58.4 kg)  12/11/23 129 lb 9.6 oz (58.8 kg)  08/01/23 138 lb 6.4 oz (62.8 kg)     Objective:  Physical Exam Vitals and nursing note reviewed.  Constitutional:      General: She is not in acute distress.    Appearance: Normal appearance. She is well-developed.  HENT:     Head: Normocephalic and atraumatic.  Right Ear: Hearing, tympanic membrane, ear canal and external ear normal. There is no impacted cerumen.     Left Ear: Hearing, tympanic membrane, ear canal and external ear normal. There is no impacted cerumen.     Nose: Nose normal.     Mouth/Throat:     Mouth: Mucous membranes are moist.  Eyes:     General: Lids are normal.     Extraocular Movements: Extraocular movements intact.     Conjunctiva/sclera: Conjunctivae normal.     Pupils: Pupils are equal, round, and reactive to light.     Funduscopic exam:    Right eye: No papilledema.        Left eye: No papilledema.  Neck:     Thyroid : No thyroid  mass.     Vascular: No carotid bruit.  Cardiovascular:     Rate and Rhythm: Normal rate and regular rhythm.     Pulses: Normal pulses.     Heart sounds: Normal heart sounds. No murmur heard. Pulmonary:     Effort: Pulmonary effort is normal. No respiratory distress.     Breath sounds: Normal breath sounds. No  wheezing.  Chest:     Chest wall: No mass.  Breasts:    Tanner Score is 5.     Right: Normal. No mass or tenderness.     Left: Normal. No mass or tenderness.  Abdominal:     General: Abdomen is flat. Bowel sounds are normal. There is no distension.     Palpations: Abdomen is soft.     Tenderness: There is no abdominal tenderness.  Genitourinary:    Comments: Followed by GYN Musculoskeletal:        General: No swelling or tenderness. Normal range of motion.     Cervical back: Full passive range of motion without pain, normal range of motion and neck supple.     Right lower leg: No edema.     Left lower leg: No edema.  Lymphadenopathy:     Upper Body:     Right upper body: No supraclavicular, axillary or pectoral adenopathy.     Left upper body: No supraclavicular, axillary or pectoral adenopathy.  Skin:    General: Skin is warm and dry.     Capillary Refill: Capillary refill takes less than 2 seconds.     Coloration: Skin is not jaundiced.     Findings: No bruising.     Comments: Callous noted to right 5th metatarsal laterally  Neurological:     General: No focal deficit present.     Mental Status: She is alert and oriented to person, place, and time.     Cranial Nerves: No cranial nerve deficit.     Sensory: No sensory deficit.     Motor: No weakness.  Psychiatric:        Mood and Affect: Mood normal.        Behavior: Behavior normal.        Thought Content: Thought content normal.        Judgment: Judgment normal.      Diabetic foot exam was performed with the following findings:   Normal sensation of 10g monofilament Intact posterior tibialis and dorsalis pedis pulses Right 5th metatarsal with callous present     Assessment And Plan:     Encounter for annual health examination Assessment & Plan: Behavior modifications discussed and diet history reviewed.   Pt will continue to exercise regularly and modify diet with low GI, plant based foods and decrease intake  of processed foods.  Recommend intake of daily multivitamin, Vitamin D , and calcium .  Recommend mammogram (she will call for her appt) and colonoscopy for preventive screenings, as well as recommend immunizations that include influenza, TDAP, and Shingles Routine wellness visit with recent travel and weight loss related to the types of food she was eating. Improved blood sugar, regular exercise, healthy diet, and improved eyesight reported.     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 No ocular bleeding or swelling. Tingling in toes likely due to diabetic polyneuropathy. Difficulty palpating foot pulses, raising concern for arterial disease. - Order arterial Doppler to assess blood flow. - Refer to podiatrist for further evaluation of foot sensation.  Orders: -     EKG 12-Lead -     Microalbumin / creatinine urine ratio -     POCT URINALYSIS DIP (CLINITEK) -     CMP14+EGFR -     Hemoglobin A1c -     VAS US  LOWER EXT ART SEG MULTI (SEGMENTALS & LE RAYNAUDS); Future  Mixed hyperlipidemia -     Lipid panel  Leukocytes in urine Assessment & Plan: Trace leuks will send urine culture.  Orders: -     Urine Culture  Callus Assessment & Plan: Noted to right 5th metatarsal, will refer to Podiatry for further evaluation  Orders: -     Ambulatory referral to Podiatry  Other long term (current) drug therapy -     CBC with Differential/Platelet  Hypertensive retinopathy of both eyes Assessment & Plan: Blood pressure is fairly controlled, continue current medications. This was noted on her eye exam by the retina specialist. EKG done with NSR HR 71   Tingling of both feet Assessment & Plan: Difficult to palpate dorsalis pedis pulses and has tingling sensations, also has decreased sensations with diabetic  foot exam. Will send for arterial doppler with ABI  Orders: -     VAS US  LOWER EXT ART SEG MULTI (SEGMENTALS & LE RAYNAUDS); Future    Return for 1 year physical, controlled DM check 4 months. Patient was given opportunity to ask questions. Patient verbalized understanding of the plan and was able to repeat key elements of the plan. All questions were answered to their satisfaction.   Shannon Ada, FNP  I, Shannon Ada, FNP, have reviewed all documentation for this visit. The documentation on 01/29/24 for the exam, diagnosis, procedures, and orders are all accurate and complete.

## 2024-01-29 NOTE — Assessment & Plan Note (Signed)
 Trace leuks will send urine culture.

## 2024-01-29 NOTE — Assessment & Plan Note (Signed)
 Diabetes management with improved blood sugar. No ocular bleeding or swelling. Tingling in toes likely due to diabetic polyneuropathy. Difficulty palpating foot pulses, raising concern for arterial disease. - Order arterial Doppler to assess blood flow. - Refer to podiatrist for further evaluation of foot sensation.

## 2024-01-29 NOTE — Assessment & Plan Note (Signed)
 Noted to right 5th metatarsal, will refer to Podiatry for further evaluation

## 2024-01-30 ENCOUNTER — Other Ambulatory Visit: Payer: Self-pay

## 2024-01-30 DIAGNOSIS — E119 Type 2 diabetes mellitus without complications: Secondary | ICD-10-CM

## 2024-01-30 LAB — CMP14+EGFR
ALT: 13 IU/L (ref 0–32)
AST: 10 IU/L (ref 0–40)
Albumin: 4.8 g/dL (ref 3.9–4.9)
Alkaline Phosphatase: 114 IU/L (ref 44–121)
BUN/Creatinine Ratio: 42 — ABNORMAL HIGH (ref 12–28)
BUN: 24 mg/dL (ref 8–27)
Bilirubin Total: 0.4 mg/dL (ref 0.0–1.2)
CO2: 23 mmol/L (ref 20–29)
Calcium: 9.9 mg/dL (ref 8.7–10.3)
Chloride: 101 mmol/L (ref 96–106)
Creatinine, Ser: 0.57 mg/dL (ref 0.57–1.00)
Globulin, Total: 2.7 g/dL (ref 1.5–4.5)
Glucose: 100 mg/dL — ABNORMAL HIGH (ref 70–99)
Potassium: 4.6 mmol/L (ref 3.5–5.2)
Sodium: 140 mmol/L (ref 134–144)
Total Protein: 7.5 g/dL (ref 6.0–8.5)
eGFR: 98 mL/min/1.73 (ref >59)

## 2024-01-30 LAB — HEMOGLOBIN A1C
Est. average glucose Bld gHb Est-mCnc: 126 mg/dL
Hgb A1c MFr Bld: 6 % — ABNORMAL HIGH (ref 4.8–5.6)

## 2024-01-30 LAB — MICROALBUMIN / CREATININE URINE RATIO
Creatinine, Urine: 85.1 mg/dL
Microalb/Creat Ratio: 7 mg/g{creat} (ref 0–29)
Microalbumin, Urine: 5.6 ug/mL

## 2024-01-30 LAB — CBC WITH DIFFERENTIAL/PLATELET
Basophils Absolute: 0 x10E3/uL (ref 0.0–0.2)
Basos: 1 %
EOS (ABSOLUTE): 0.1 x10E3/uL (ref 0.0–0.4)
Eos: 1 %
Hematocrit: 37.1 % (ref 34.0–46.6)
Hemoglobin: 11.6 g/dL (ref 11.1–15.9)
Immature Grans (Abs): 0 x10E3/uL (ref 0.0–0.1)
Immature Granulocytes: 0 %
Lymphocytes Absolute: 1.8 x10E3/uL (ref 0.7–3.1)
Lymphs: 35 %
MCH: 27.5 pg (ref 26.6–33.0)
MCHC: 31.3 g/dL — ABNORMAL LOW (ref 31.5–35.7)
MCV: 88 fL (ref 79–97)
Monocytes Absolute: 0.4 x10E3/uL (ref 0.1–0.9)
Monocytes: 9 %
Neutrophils Absolute: 2.7 x10E3/uL (ref 1.4–7.0)
Neutrophils: 54 %
Platelets: 215 x10E3/uL (ref 150–450)
RBC: 4.22 x10E6/uL (ref 3.77–5.28)
RDW: 14.3 % (ref 11.7–15.4)
WBC: 5 x10E3/uL (ref 3.4–10.8)

## 2024-01-30 LAB — LIPID PANEL
Chol/HDL Ratio: 2.6 ratio (ref 0.0–4.4)
Cholesterol, Total: 182 mg/dL (ref 100–199)
HDL: 69 mg/dL (ref >39)
LDL Chol Calc (NIH): 96 mg/dL (ref 0–99)
Triglycerides: 97 mg/dL (ref 0–149)
VLDL Cholesterol Cal: 17 mg/dL (ref 5–40)

## 2024-01-30 MED ORDER — ATORVASTATIN CALCIUM 40 MG PO TABS: ORAL_TABLET | ORAL | 10 refills | Status: DC

## 2024-01-30 MED ORDER — LISINOPRIL 10 MG PO TABS: 10.0000 mg | ORAL_TABLET | Freq: Every morning | ORAL | 11 refills | Status: DC

## 2024-01-30 MED ORDER — FREESTYLE LIBRE 3 PLUS SENSOR MISC: 3 refills | Status: DC

## 2024-01-30 MED ORDER — METFORMIN HCL ER 750 MG PO TB24
750.0000 mg | ORAL_TABLET | Freq: Every day | ORAL | 11 refills | Status: DC
  Filled 2024-02-28: qty 30, 30d supply, fill #0

## 2024-01-30 NOTE — Progress Notes (Signed)
 Shannon [name]

## 2024-01-31 ENCOUNTER — Other Ambulatory Visit: Payer: Self-pay | Admitting: Nurse Practitioner

## 2024-01-31 DIAGNOSIS — Z1231 Encounter for screening mammogram for malignant neoplasm of breast: Secondary | ICD-10-CM

## 2024-01-31 LAB — URINE CULTURE

## 2024-02-03 ENCOUNTER — Encounter: Payer: Self-pay | Admitting: Family Medicine

## 2024-02-03 ENCOUNTER — Ambulatory Visit (INDEPENDENT_AMBULATORY_CARE_PROVIDER_SITE_OTHER): Payer: HMO | Admitting: Family Medicine

## 2024-02-03 ENCOUNTER — Ambulatory Visit (HOSPITAL_COMMUNITY)
Admission: RE | Admit: 2024-02-03 | Discharge: 2024-02-03 | Disposition: A | Discharge status: Home / Self Care | Source: Ambulatory Visit | Attending: Nurse Practitioner | Admitting: Nurse Practitioner

## 2024-02-03 ENCOUNTER — Ambulatory Visit: Admitting: Podiatry

## 2024-02-03 VITALS — Ht 63.0 in | Wt 128.8 lb

## 2024-02-03 VITALS — BP 100/69 | Ht 63.0 in | Wt 126.5 lb

## 2024-02-03 DIAGNOSIS — E113413 Type 2 diabetes mellitus with severe nonproliferative diabetic retinopathy with macular edema, bilateral: Secondary | ICD-10-CM | POA: Insufficient documentation

## 2024-02-03 DIAGNOSIS — G4733 Obstructive sleep apnea (adult) (pediatric): Secondary | ICD-10-CM | POA: Diagnosis not present

## 2024-02-03 DIAGNOSIS — L84 Corns and callosities: Secondary | ICD-10-CM

## 2024-02-03 DIAGNOSIS — Z794 Long term (current) use of insulin: Secondary | ICD-10-CM

## 2024-02-03 DIAGNOSIS — E113493 Type 2 diabetes mellitus with severe nonproliferative diabetic retinopathy without macular edema, bilateral: Secondary | ICD-10-CM | POA: Diagnosis not present

## 2024-02-03 DIAGNOSIS — R202 Paresthesia of skin: Secondary | ICD-10-CM | POA: Diagnosis not present

## 2024-02-03 LAB — VAS US LOWER EXT ART SEG MULTI (SEGMENTALS & LE RAYNAUDS)
Left ABI: 0.91
Right ABI: 0.97

## 2024-02-03 NOTE — Progress Notes (Signed)
 Subjective:  Patient ID: Shannon [name], female    DOB: 1956/05/03,  MRN: [id number]  Chief Complaint  Patient presents with   Callouses    Rm 23 Patient is here for callus on the fifth right toe. Patient has used callus pads to protect the toe and prevent further irritation.    Discussed the use of AI scribe software for clinical note transcription with the patient, who gave verbal consent to proceed.  History of Present Illness Shannon [name] is a 68 year old female with type 2 diabetes who presents with a painful callus on the right fifth toe.  She experiences significant discomfort from a painful callus on her right fifth toe. She has been using callus pads to protect the toe and prevent further irritation, but they tend to slip off, reducing their effectiveness. She applies lotion to her feet daily but has not used any specific creams for the callus.  Her diabetes is well-controlled with an A1c of 6.0% as of January 29, 2024. She is cautious about using medicated corn remover pads due to her diabetes.  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      Objective:    Physical Exam VASCULAR: DP and PT pulse palpable. Foot is warm and well-perfused. Capillary fill time is brisk. DERMATOLOGIC: Normal skin turgor, texture, and temperature. No open lesions, rashes, or ulcerations. NEUROLOGIC: Normal sensation to light touch and pressure. No paresthesias on examination. ORTHOPEDIC: Adductovarus fifth toe contracture bilaterally with corn/callus on PIPJ, pain to palpation. Smooth pain-free range of motion of all examined joints.    No images are attached to the encounter.    Results Procedure: Callus shaving Description: Callus on the right fifth toe was sharply debrided to a tolerable level with a #312 blade  LABS A1c: 6.0% (01/29/2024)    Assessment:   1. Callus of foot   2. Type 2 diabetes mellitus with both eyes affected by severe nonproliferative retinopathy without macular edema, with long-term current use of insulin  (HCC)      Plan:  Patient was evaluated and treated and all questions answered.  Assessment and Plan Assessment & Plan Painful callus (corn) on right fifth toe Presents with a painful callus on the right fifth toe, associated with an adductovarus contracture. The callus is located on the PIPJ and is painful to palpation. She has been using callus pads to alleviate pressure, but they tend to slip off. Concerned about managing the callus due to her diabetes, but deemed it safe given her well-controlled condition. - Shave down the corn. - Recommend 40% urea cream for callus application. - Suggest silicone pads from [employer]. - Advise on the cautious use of medicated corn remover pads with salicylic acid. - Discuss surgical option if conservative measures fail.  Adductovarus contracture of fifth toe, bilateral Adductovarus contracture of the fifth toe bilaterally contributes to the formation of the callus on the right fifth toe. The contracture causes the knuckle of the toe to rub against shoes, leading to pressure points and callus formation. - Discuss surgical option to address adductovarus contracture if conservative measures fail.  Type 2 diabetes mellitus, well controlled Type 2 diabetes is well controlled with an A1c of 6.0% as of January 29, 2024, reducing the risk of complications from treatments such as medicated corn remover pads.      Return  if symptoms worsen or fail to improve.

## 2024-02-03 NOTE — Patient Instructions (Signed)
More silicone pads can be purchased from:  [email or web address]    Look for urea 40% cream or ointment and apply to the thickened dry skin / calluses. This can be bought over the counter, at a pharmacy or online such as [employer].  

## 2024-02-21 ENCOUNTER — Other Ambulatory Visit: Payer: Self-pay | Admitting: Nurse Practitioner

## 2024-02-26 ENCOUNTER — Other Ambulatory Visit (HOSPITAL_COMMUNITY): Payer: Self-pay

## 2024-02-26 ENCOUNTER — Telehealth: Payer: Self-pay | Admitting: Pharmacist

## 2024-02-26 DIAGNOSIS — E113493 Type 2 diabetes mellitus with severe nonproliferative diabetic retinopathy without macular edema, bilateral: Secondary | ICD-10-CM

## 2024-02-26 NOTE — Progress Notes (Unsigned)
   02/26/2024  Patient ID: Shannon [name] Mania, female   DOB: 07-15-1955, 68 y.o.   MRN: [id number]  Patient called the clinic to inquire about her Ozempic  through Patient Assistance. Janece Moore's CMA, Kristeen Ada, sent a me a message to inquire. Upon review, she had been approved last year and had been picking up refills. Unfortunately, Novo Nordisk stopped doing automatic refills last month.    Since the Patient is already approved. A refill/order request form was completed on her behalf and faxed to Luke Mall, CPhT for the application to be scanned into the chart and sent to Nov[employer].  Patient was called. HIPAA identifiers were obtained.  The process was explained to the Patient and she communicated understanding.  Her medications were reviewed via telephone:  Medications Reviewed Today     Reviewed by Jolee Cassius PARAS, [facility] (Pharmacist) on 02/26/24 at 1653  Med List Status: <None>   Medication Order Taking? Sig Documenting Provider Last Dose Status Informant  atorvastatin  (LIPITOR) 40 MG tablet [id number] Yes TAKE 1 TABLET BY MOUTH IN THE EVENING WITH A MEAL Ada Speaks, FNP  Active   Blood Glucose Monitoring Suppl (ONE TOUCH ULTRA MINI) w/Device KIT [id number] Yes 1 each by Does not apply route 4 (four) times daily -  before meals and at bedtime. Ada Speaks, FNP  Active Self  cholecalciferol  (VITAMIN D3) 25 MCG (1000 UNIT) tablet [id number] Yes Take 1,000 Units by mouth daily. [provider]  Active   Continuous Glucose Sensor (FREESTYLE LIBRE 2 SENSOR) [place] [id number] Yes USE TO MONITOR BLOOD SUGAR. CHANGE SENSOR EVERY 14 DAYS. Ada Speaks, FNP  Active   glucagon (GLUCAGEN  HYPOKIT) 1 MG SOLR injection [id number] Yes Inject 1 mg into the vein once as needed for up to 1 dose for low blood sugar. Ada Speaks, FNP  Active Self  lisinopril  (ZESTRIL ) 10 MG tablet [id number] Yes Take 1 tablet (10 mg total) by mouth every morning. Ada Speaks, FNP  Active   metFORMIN  (GLUCOPHAGE -XR)  750 MG 24 hr tablet [id number] Yes Take 1 tablet (750 mg total) by mouth daily with breakfast. Ada Speaks, FNP  Active   Semaglutide , 2 MG/DOSE, 8 MG/3ML SOPN [id number] Yes Inject 2 mg as directed once a week. Ada Speaks, FNP  Active            Lab Results  Component Value Date   HGBA1C 6.0 (H) 01/29/2024   HGBA1C 6.3 (H) 08/01/2023   HGBA1C 6.6 (H) 01/28/2023   Patient's most recent HgA1c was 6%  She is taking metformin  XR 750 mg 1 tablet with breakfast (filled 02/13/24 for a 30 day supply) and Ozempic  2 mg weekly to control her blood sugars.  On statin therapy:  Atorvastatin  40 mg filled 02/13/24 for a 30 Day supply  On ACE-Lisinopril  10 mg filled 02/13/24 for a 30 day supply    She is using Freestyle Libre 2 sensors and is not attached to our clinic's [employer] Portal.  Plan: Send new

## 2024-02-27 ENCOUNTER — Other Ambulatory Visit (HOSPITAL_COMMUNITY): Payer: Self-pay

## 2024-02-27 MED ORDER — FREESTYLE LIBRE 3 PLUS SENSOR MISC
3 refills | Status: AC
  Filled 2024-03-02: qty 6, 84d supply, fill #0
  Filled 2024-03-11: qty 6, 90d supply, fill #0
  Filled 2024-06-29: qty 6, 90d supply, fill #1

## 2024-02-27 MED ORDER — LISINOPRIL 10 MG PO TABS
10.0000 mg | ORAL_TABLET | Freq: Every morning | ORAL | 11 refills | Status: AC
  Filled 2024-03-02 – 2024-03-10 (×2): qty 30, 30d supply, fill #0
  Filled 2024-04-08: qty 30, 30d supply, fill #1
  Filled 2024-05-07: qty 30, 30d supply, fill #2
  Filled 2024-06-03: qty 30, 30d supply, fill #3
  Filled 2024-07-04: qty 30, 30d supply, fill #4

## 2024-02-27 MED ORDER — INSULIN DEGLUDEC FLEXTOUCH 100 UNIT/ML ~~LOC~~ SOPN: 20.0000 [IU] | PEN_INJECTOR | Freq: Every day | SUBCUTANEOUS | 10 refills | Status: DC

## 2024-02-27 MED ORDER — ATORVASTATIN CALCIUM 40 MG PO TABS
40.0000 mg | ORAL_TABLET | Freq: Every evening | ORAL | 10 refills | Status: AC
  Filled 2024-03-02 – 2024-03-10 (×2): qty 30, 30d supply, fill #0
  Filled 2024-04-08: qty 30, 30d supply, fill #1
  Filled 2024-05-07: qty 30, 30d supply, fill #2
  Filled 2024-06-03: qty 30, 30d supply, fill #3
  Filled 2024-07-04: qty 30, 30d supply, fill #4

## 2024-02-27 MED ORDER — METFORMIN HCL ER 750 MG PO TB24
750.0000 mg | ORAL_TABLET | Freq: Every morning | ORAL | 11 refills | Status: AC
  Filled 2024-03-10: qty 30, 30d supply, fill #0
  Filled 2024-04-08: qty 30, 30d supply, fill #1
  Filled 2024-05-07: qty 30, 30d supply, fill #2
  Filled 2024-06-03: qty 30, 30d supply, fill #3
  Filled 2024-07-04: qty 30, 30d supply, fill #4

## 2024-02-28 ENCOUNTER — Other Ambulatory Visit: Payer: Self-pay

## 2024-02-28 ENCOUNTER — Other Ambulatory Visit (HOSPITAL_COMMUNITY): Payer: Self-pay

## 2024-02-28 ENCOUNTER — Ambulatory Visit
Admission: RE | Admit: 2024-02-28 | Discharge: 2024-02-28 | Disposition: A | Discharge status: Home / Self Care | Source: Ambulatory Visit | Attending: Nurse Practitioner | Admitting: Nurse Practitioner

## 2024-02-28 ENCOUNTER — Telehealth: Payer: Self-pay

## 2024-02-28 DIAGNOSIS — Z1231 Encounter for screening mammogram for malignant neoplasm of breast: Secondary | ICD-10-CM

## 2024-02-28 MED ORDER — ATORVASTATIN CALCIUM 40 MG PO TABS: 40.0000 mg | ORAL_TABLET | Freq: Every evening | ORAL | 10 refills | Status: DC

## 2024-02-28 MED ORDER — LISINOPRIL 10 MG PO TABS: 10.0000 mg | ORAL_TABLET | Freq: Every morning | ORAL | 11 refills | Status: DC

## 2024-02-28 MED FILL — Continuous Glucose System Sensor: 84 days supply | Qty: 6 | Fill #0 | Status: AC

## 2024-02-28 NOTE — Telephone Encounter (Signed)
 Completed refill form for Ozempic  (Novo Nordisk) and faxed providers office for review and signature.

## 2024-03-02 ENCOUNTER — Other Ambulatory Visit: Payer: Self-pay

## 2024-03-02 ENCOUNTER — Other Ambulatory Visit (HOSPITAL_COMMUNITY): Payer: Self-pay

## 2024-03-04 ENCOUNTER — Ambulatory Visit: Payer: Self-pay | Admitting: Nurse Practitioner

## 2024-03-10 ENCOUNTER — Other Ambulatory Visit: Payer: Self-pay

## 2024-03-10 ENCOUNTER — Telehealth: Payer: Self-pay

## 2024-03-10 ENCOUNTER — Telehealth: Payer: Self-pay | Admitting: Pharmacist

## 2024-03-10 DIAGNOSIS — E113493 Type 2 diabetes mellitus with severe nonproliferative diabetic retinopathy without macular edema, bilateral: Secondary | ICD-10-CM

## 2024-03-10 DIAGNOSIS — E113413 Type 2 diabetes mellitus with severe nonproliferative diabetic retinopathy with macular edema, bilateral: Secondary | ICD-10-CM

## 2024-03-10 NOTE — Progress Notes (Signed)
   03/10/2024  Patient ID: Shannon [name] Mania, female   DOB: 1956-02-03, 68 y.o.   MRN: [id number]   Received a message that Patient called to inquire about Ozempic .  A refill/change request form was completed on the Patient's behalf last week.  Unfortunately, Novo Nordisk has been very behind on shipping.    Patient was called. HIPAA identifiers were obtained.  She was informed about the status of her application. She communicated understanding and asked it she could get samples to hold her over.  We only have Ozempic  0.25/ 0.5 samples in the office. A note was sent to [name], FNP and her CMA, Oddis Lunger.   They put samples up at the front for the Patient.  Patient said she understood about dosing.  Plan: Follow up in 1 month to see if her shipment has arrived.   Cassius DOROTHA Brought, PharmD, BCACP Clinical Pharmacist [phone]

## 2024-03-10 NOTE — Telephone Encounter (Signed)
 Copied from CRM [id number]. Topic: Clinical - Medication Question >> Mar 10, 2024 10:26 AM Treva T wrote: Reason for CRM: Patient calling, requesting a return call regarding medication, Ozempic .  Patient reports that she has completed shots for current dose, 2mg  per week, and would like to know next steps.  Patient wants to also inquire on medication assistance form that should be faxed on yesterday.  Patient can be reached at [phone].

## 2024-03-11 ENCOUNTER — Other Ambulatory Visit (HOSPITAL_COMMUNITY): Payer: Self-pay

## 2024-03-11 ENCOUNTER — Other Ambulatory Visit: Payer: Self-pay

## 2024-03-27 ENCOUNTER — Telehealth: Payer: Self-pay | Admitting: Pharmacist

## 2024-03-27 DIAGNOSIS — E113493 Type 2 diabetes mellitus with severe nonproliferative diabetic retinopathy without macular edema, bilateral: Secondary | ICD-10-CM

## 2024-03-27 NOTE — Progress Notes (Signed)
   03/27/2024  Patient ID: Shannon [name], female   DOB: 1955-08-18, 68 y.o.   MRN: [id number]  Patient called yesterday and wondered if she could come in and be shown how to download the new [employer].  A request was made to the clinic staff but the nurse schedule is full until next Tuesday.I will be back in the office on Wednesday and am more than happy to schedule the Patient to come in and see me.  I called the Patient back today but she did not answer her phone. HIPAA compliant message was left on her voicemail.  Plan: Send Patient a My Chart Message. Follow up with her next week.   Shannon [name], PharmD, BCACP Clinical Pharmacist [phone]

## 2024-03-31 ENCOUNTER — Telehealth: Payer: Self-pay | Admitting: Pharmacist

## 2024-03-31 NOTE — Progress Notes (Signed)
   03/31/2024  Patient ID: Shannon [name], female   DOB: 05/04/1956, 68 y.o.   MRN: [id number] Called Patient to follow up on getting the [facility] App downloaded to her phone. HIPAA identifiers were obtained. Unfortunately, after walking the Patient through the process multiple times, she could not get the [employer]. An appointment was made with the Patient for an in person visit tomorrow.   Cassius DOROTHA Brought, PharmD, BCACP Clinical Pharmacist [phone]

## 2024-04-01 ENCOUNTER — Ambulatory Visit (INDEPENDENT_AMBULATORY_CARE_PROVIDER_SITE_OTHER): Payer: Self-pay | Admitting: Nurse Practitioner

## 2024-04-01 ENCOUNTER — Other Ambulatory Visit (HOSPITAL_COMMUNITY): Payer: Self-pay

## 2024-04-01 DIAGNOSIS — Z794 Long term (current) use of insulin: Secondary | ICD-10-CM

## 2024-04-01 DIAGNOSIS — E113493 Type 2 diabetes mellitus with severe nonproliferative diabetic retinopathy without macular edema, bilateral: Secondary | ICD-10-CM

## 2024-04-01 MED ORDER — BLOOD GLUCOSE TEST VI STRP
1.0000 | ORAL_STRIP | Freq: Three times a day (TID) | 3 refills | Status: AC
  Filled 2024-04-01: qty 200, 67d supply, fill #0
  Filled 2024-06-01: qty 200, 67d supply, fill #1

## 2024-04-01 MED ORDER — LANCET DEVICE MISC
0 refills | Status: AC
  Filled 2024-04-01: qty 1, fill #0
  Filled 2024-06-01: qty 1, 1d supply, fill #0

## 2024-04-01 MED ORDER — BLOOD GLUCOSE MONITOR SYSTEM W/DEVICE KIT
PACK | 0 refills | Status: DC
  Filled 2024-04-01 (×2): qty 1, 30d supply, fill #0

## 2024-04-01 MED ORDER — LANCETS 33G MISC
3 refills | Status: AC
  Filled 2024-04-01: qty 100, 33d supply, fill #0
  Filled 2024-06-01: qty 100, 33d supply, fill #1

## 2024-04-01 NOTE — Progress Notes (Unsigned)
 04/01/2024 Name: Shannon [name] MRN: [id number] DOB: Jun 19, 1956  Chief Complaint  Patient presents with   Medication Management    Continuous Glucose Monitor App Training     Shannon [name] is a 68 y.o. year old female who was referred for medication management by their primary care provider, Shannon Speaks, FNP. They presented for a face to face visit today.   They were referred to the pharmacist by their PCP for assistance in managing diabetes    Subjective:  Care Team: Primary Care Provider: Georgina Speaks, FNP ; Next Scheduled Visit: 06/01/24  Medication Access/Adherence  Current Pharmacy:  [facility] [address] [place] - [address] [place] [address] Phone: [phone] Fax: [phone]  [facility] [place]  - [place], [place] - [address] [place] [address] Phone: [phone] Fax: [phone]  [facility] - [facility] [address] [place] [address] Phone: [phone] Fax: [phone]  Patient reports affordability concerns with their medications: Yes -Gets Ozempic  through [employer] Patient Assistance Program  Patient reports access/transportation concerns to their pharmacy: No  Patient reports adherence concerns with their medications:  No     Diabetes:  Ozempic  2 mg weekly Metformin  XR 750 mg 1 tablet daily  Current glucose readings: Avg blood glucose from Fre[employer] 106 mg/dl   We set  the sensor up with the phone app in clinic today so it was not ready yet.  Patient denies hypoglycemic s/sx including  dizziness, shakiness, sweating. Patient denies hyperglycemic symptoms including  polyuria, polydipsia, polyphagia, nocturia, neuropathy, blurred vision.  Current medication access support: New Application completed for [employer] Patient assistance program after calling and speaking with a representative who said her application from last year  expired and that we sent a renewal app but she needed a full app done.  Macrovascular and Microvascular Risk Reduction:  Statin? yes (Atorvastatin  40 mg); ACEi/ARB? yes (Lisinopril  lf 03/12/24) Last urinary albumin /creatinine ratio:  Lab Results  Component Value Date   MICRALBCREAT 7 01/29/2024   MICRALBCREAT 14 01/28/2023   MICRALBCREAT <14 01/23/2022   MICRALBCREAT 16 11/21/2021   MICRALBCREAT <30 11/17/2020   MICRALBCREAT 300 08/11/2019   Last eye exam:  Lab Results  Component Value Date   HMDIABEYEEXA Retinopathy (A) 09/09/2023   Last foot exam: 01/29/2024 Tobacco Use:  Tobacco Use: Medium Risk (02/03/2024)   Patient History    Smoking Tobacco Use: Former    Smokeless Tobacco Use: Never    Passive Exposure: Not on file     Objective:  Lab Results  Component Value Date   HGBA1C 6.0 (H) 01/29/2024    Lab Results  Component Value Date   CREATININE 0.57 01/29/2024   BUN 24 01/29/2024   NA 140 01/29/2024   K 4.6 01/29/2024   CL 101 01/29/2024   CO2 23 01/29/2024    Lab Results  Component Value Date   CHOL 182 01/29/2024   HDL 69 01/29/2024   LDLCALC 96 01/29/2024   TRIG 97 01/29/2024   CHOLHDL 2.6 01/29/2024    Medications Reviewed Today     Reviewed by Jolee Cassius PARAS, RPH (Pharmacist) on 04/01/24 at 1322  Med List Status: <None>   Medication Order Taking? Sig Documenting Provider Last Dose Status Informant  atorvastatin  (LIPITOR) 40 MG tablet [id number] Yes Take 1 tablet (40 mg total) by mouth every evening with meal.   Active   Blood Glucose Monitoring Suppl (ONE TOUCH ULTRA  MINI) w/Device KIT [id number]  1 each by Does not apply route 4 (four) times daily -  before meals and at bedtime. Shannon Speaks, FNP  Active Self  cholecalciferol  (VITAMIN D3) 25 MCG (1000 UNIT) tablet [id number]  Take 1,000 Units by mouth daily. [provider]  Active   Continuous Glucose Sensor (FREESTYLE LIBRE 3 PLUS SENSOR) [place] [id number] Yes Use as directed to monitor  blood sugar and change sensor every 15 days Shannon Speaks, FNP  Active   glucagon (GLUCAGEN  HYPOKIT) 1 MG SOLR injection [id number] Yes Inject 1 mg into the vein once as needed for up to 1 dose for low blood sugar. Shannon Speaks, FNP  Active Self    Discontinued 04/01/24 1309 (Discontinued by provider)   lisinopril  (ZESTRIL ) 10 MG tablet [id number] Yes Take 1 tablet (10 mg total) by mouth every morning.   Active   metFORMIN  (GLUCOPHAGE -XR) 750 MG 24 hr tablet [id number] Yes Take 1 tablet (750 mg total) by mouth every morning with breakfast.   Active   Semaglutide , 2 MG/DOSE, 8 MG/3ML SOPN [id number] Yes Inject 2 mg as directed once a week. Shannon Speaks, FNP  Active            Med Note MACK CASSIUS JINNY Stevan Feb 26, 2024  5:07 PM) GETS THIS THROUGH PATIENT ASSISTANCE              02/03/2024   12:51 PM 02/03/2024    8:32 AM 01/29/2024   11:05 AM  Vitals with BMI  Height 5' 3 5' 3 5' 3  Weight 126 lbs 8 oz 128 lbs 13 oz 128 lbs 13 oz  BMI 22.41 22.82 22.82  Systolic 100  130  Diastolic 69  80  Pulse   88     Assessment/Plan:   Diabetes: - Currently controlled; goal A1c <7%. Cardiorenal risk reduction is optimized.. Blood pressure is at goal <130/80. LDL is at goal.  - sent new prescription for diabetes meter and supplies - Recommend to continue current therapy and research the forumlary options for her insurance next year.   Follow Up Plan:    Follow up with Patient in 3 weeks.   CASSIUS DOROTHA Brought, PharmD, BCACP Clinical Pharmacist [phone]

## 2024-04-02 ENCOUNTER — Telehealth: Payer: Self-pay

## 2024-04-02 ENCOUNTER — Other Ambulatory Visit: Payer: Self-pay

## 2024-04-02 NOTE — Telephone Encounter (Signed)
 PAP: Application for Ozempic has been submitted to [employer], via fax

## 2024-04-08 ENCOUNTER — Other Ambulatory Visit: Payer: Self-pay

## 2024-04-13 ENCOUNTER — Other Ambulatory Visit: Payer: Self-pay

## 2024-05-07 ENCOUNTER — Other Ambulatory Visit: Payer: Self-pay

## 2024-05-08 ENCOUNTER — Other Ambulatory Visit: Payer: Self-pay

## 2024-05-14 ENCOUNTER — Other Ambulatory Visit: Payer: Self-pay

## 2024-05-19 ENCOUNTER — Other Ambulatory Visit: Payer: Self-pay

## 2024-05-20 ENCOUNTER — Other Ambulatory Visit: Payer: Self-pay

## 2024-06-01 ENCOUNTER — Other Ambulatory Visit: Payer: Self-pay | Admitting: Nurse Practitioner

## 2024-06-01 ENCOUNTER — Other Ambulatory Visit: Payer: Self-pay

## 2024-06-01 ENCOUNTER — Other Ambulatory Visit (HOSPITAL_COMMUNITY): Payer: Self-pay

## 2024-06-01 ENCOUNTER — Ambulatory Visit: Payer: Self-pay | Admitting: Nurse Practitioner

## 2024-06-01 DIAGNOSIS — E113493 Type 2 diabetes mellitus with severe nonproliferative diabetic retinopathy without macular edema, bilateral: Secondary | ICD-10-CM

## 2024-06-01 MED ORDER — BLOOD GLUCOSE MONITOR SYSTEM W/DEVICE KIT
PACK | 0 refills | Status: AC
  Filled 2024-06-01: qty 1, 30d supply, fill #0

## 2024-06-03 ENCOUNTER — Telehealth: Admitting: Nurse Practitioner

## 2024-06-03 ENCOUNTER — Encounter: Payer: Self-pay | Admitting: Nurse Practitioner

## 2024-06-03 ENCOUNTER — Other Ambulatory Visit

## 2024-06-03 ENCOUNTER — Other Ambulatory Visit (HOSPITAL_COMMUNITY): Payer: Self-pay

## 2024-06-03 DIAGNOSIS — E113493 Type 2 diabetes mellitus with severe nonproliferative diabetic retinopathy without macular edema, bilateral: Secondary | ICD-10-CM | POA: Diagnosis not present

## 2024-06-03 DIAGNOSIS — Z794 Long term (current) use of insulin: Secondary | ICD-10-CM | POA: Diagnosis not present

## 2024-06-03 DIAGNOSIS — E782 Mixed hyperlipidemia: Secondary | ICD-10-CM | POA: Diagnosis not present

## 2024-06-03 DIAGNOSIS — Z7985 Long-term (current) use of injectable non-insulin antidiabetic drugs: Secondary | ICD-10-CM | POA: Diagnosis not present

## 2024-06-03 LAB — BMP8+EGFR
BUN/Creatinine Ratio: 45 — ABNORMAL HIGH (ref 12–28)
BUN: 25 mg/dL (ref 8–27)
CO2: 23 mmol/L (ref 20–29)
Calcium: 9.6 mg/dL (ref 8.7–10.3)
Chloride: 101 mmol/L (ref 96–106)
Creatinine, Ser: 0.55 mg/dL — ABNORMAL LOW (ref 0.57–1.00)
Glucose: 133 mg/dL — ABNORMAL HIGH (ref 70–99)
Potassium: 4.4 mmol/L (ref 3.5–5.2)
Sodium: 139 mmol/L (ref 134–144)
eGFR: 99 mL/min/1.73 (ref >59)

## 2024-06-03 LAB — HEMOGLOBIN A1C
Est. average glucose Bld gHb Est-mCnc: 140 mg/dL
Hgb A1c MFr Bld: 6.5 % — ABNORMAL HIGH (ref 4.8–5.6)

## 2024-06-03 MED ORDER — SEMAGLUTIDE (2 MG/DOSE) 8 MG/3ML ~~LOC~~ SOPN
2.0000 mg | PEN_INJECTOR | SUBCUTANEOUS | 1 refills | Status: AC
  Filled 2024-06-03: qty 3, 28d supply, fill #0

## 2024-06-03 NOTE — Assessment & Plan Note (Signed)
 HgbA1c is stable and she is doing well. Continue current medications. 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 She has 3 pens left.

## 2024-06-03 NOTE — Assessment & Plan Note (Signed)
 Well controlled, will not check lipid panel today. Continue current medications.

## 2024-06-03 NOTE — Progress Notes (Signed)
 Virtual Visit via Video Note  [name], CMA,acting as a scribe for [name], FNP.,have documented all relevant documentation on the behalf of [name], FNP,as directed by  [name], FNP while in the presence of [name], FNP.  I connected with Shannon [name] on 06/03/24 at  9:20 AM EST by a video enabled telemedicine application and verified that I am speaking with the correct person using two identifiers.  Patient Location: Home Provider Location: Other:  Home Office  I discussed the limitations, risks, security, and privacy concerns of performing an evaluation and management service by video and the availability of in person appointments. I also discussed with the patient that there may be a patient responsible charge related to this service. The patient expressed understanding and agreed to proceed.  Subjective: PCP: Ada Gaines, FNP  Chief Complaint  Patient presents with   Diabetes    Patient presents today for a chol and dm follow up, Patient reports compliance with medication. Patient denies any chest pain, SOB, or headaches. Patient has no concerns today.    Patient presents today for a chol and dm follow up. Continues to see the ophthalmologist every 4-6 months. Continues to take Ozempic  2 mg weekly  Diabetes Shannon [name] presents for her follow-up diabetic visit. Shannon [name] has type 2 diabetes mellitus. No MedicAlert identification noted. Her disease course has been stable. There are no hypoglycemic associated symptoms. There are no diabetic associated symptoms. Pertinent negatives for diabetes include no blurred vision. There are no hypoglycemic complications. Symptoms are stable. There are no diabetic complications. Risk factors for coronary artery disease include sedentary lifestyle and diabetes mellitus. Current diabetic treatment includes oral agent (monotherapy) and diet. Shannon [name] is compliant with treatment all of the time. Her weight is decreasing steadily. Shannon [name] is  following a generally healthy diet. When asked about meal planning, Shannon [name] reported none. Shannon [name] rarely participates in exercise. There is no change in her home blood glucose trend. (Average less than 140 ) An ACE inhibitor/angiotensin II receptor blocker is being taken. Shannon [name] does not see a podiatrist.Eye exam is current.     ROS: Per HPI  Current Outpatient Medications:    atorvastatin  (LIPITOR) 40 MG tablet, Take 1 tablet (40 mg total) by mouth every evening with meal., Disp: 30 tablet, Rfl: 10   Blood Glucose Monitoring Suppl (BLOOD GLUCOSE MONITOR SYSTEM) w/Device KIT, Use as directed to test blood sugar 3 times daily., Disp: 1 kit, Rfl: 0   Blood Glucose Monitoring Suppl (ONE TOUCH ULTRA MINI) w/Device KIT, 1 each by Does not apply route 4 (four) times daily -  before meals and at bedtime., Disp: 1 kit, Rfl: 0   cholecalciferol  (VITAMIN D3) 25 MCG (1000 UNIT) tablet, Take 1,000 Units by mouth daily., Disp: , Rfl:    Continuous Glucose Sensor (FREESTYLE LIBRE 3 PLUS SENSOR) MISC, Use as directed to monitor blood sugar and change sensor every 15 days, Disp: 6 each, Rfl: 3   glucagon (GLUCAGEN  HYPOKIT) 1 MG SOLR injection, Inject 1 mg into the vein once as needed for up to 1 dose for low blood sugar., Disp: 1 each, Rfl: 5   Glucose Blood (BLOOD GLUCOSE TEST STRIPS) STRP, Use to test blood sugar three times daily., Disp: 200 strip, Rfl: 3   Lancet Device MISC, Use to test blood sugar three times daily--May substitute to any manufacturer covered by patient's insurance., Disp: 1 each, Rfl: 0   Lancets 33G MISC, Use as directed to test blood sugars three  times daily., Disp: 100 each, Rfl: 3   lisinopril  (ZESTRIL ) 10 MG tablet, Take 1 tablet (10 mg total) by mouth every morning., Disp: 30 tablet, Rfl: 11   metFORMIN  (GLUCOPHAGE -XR) 750 MG 24 hr tablet, Take 1 tablet (750 mg total) by mouth every morning with breakfast., Disp: 30 tablet, Rfl: 11   Semaglutide , 2 MG/DOSE, 8 MG/3ML SOPN, Inject 2 mg as directed  once a week., Disp: 9 mL, Rfl: 1  Observations/Objective: There were no vitals filed for this visit. Physical Exam Vitals and nursing note reviewed.  Constitutional:      General: Shannon [name] is not in acute distress.    Appearance: Normal appearance.  Cardiovascular:     Rate and Rhythm: Normal rate and regular rhythm.     Pulses: Normal pulses.     Heart sounds: Normal heart sounds. No murmur heard. Pulmonary:     Effort: Pulmonary effort is normal. No respiratory distress.     Breath sounds: No wheezing.  Musculoskeletal:     Cervical back: Normal range of motion.  Skin:    General: Skin is warm and dry.  Neurological:     General: No focal deficit present.     Mental Status: Shannon [name] is alert and oriented to person, place, and time.     Cranial Nerves: No cranial nerve deficit.     Motor: No weakness.  Psychiatric:        Mood and Affect: Mood normal.        Behavior: Behavior normal.        Thought Content: Thought content normal.        Judgment: Judgment normal.     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 Continue current medications. 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 Shannon [name] has 3 pens left.   Orders: -     Hemoglobin A1c; Future -     BMP8+eGFR -     Semaglutide  (2 MG/DOSE); Inject 2 mg as directed once a week.  Dispense: 9 mL; Refill: 1  Mixed hyperlipidemia Assessment & Plan: Well controlled, will not check lipid panel today. Continue current medications.      Follow Up Instructions: No follow-ups on file.   I discussed the assessment and treatment plan with the patient. The patient was provided an opportunity to ask questions, and all were answered. The patient agreed with the plan and demonstrated an understanding  of the instructions.   The patient was advised to call back or seek an in-person evaluation if the symptoms worsen or if the condition fails to improve as anticipated.  The above assessment and management plan was discussed with the patient. The patient verbalized understanding of and has agreed to the management plan.   LILLETTE [name], FNP, have reviewed all documentation for this visit. The documentation on 06/03/24 for the exam, diagnosis, procedures, and orders are all accurate and complete.

## 2024-06-04 ENCOUNTER — Other Ambulatory Visit: Payer: Self-pay

## 2024-06-05 ENCOUNTER — Other Ambulatory Visit: Payer: Self-pay

## 2024-06-27 ENCOUNTER — Other Ambulatory Visit: Payer: Self-pay

## 2024-06-27 ENCOUNTER — Other Ambulatory Visit (HOSPITAL_COMMUNITY): Payer: Self-pay

## 2024-06-30 ENCOUNTER — Other Ambulatory Visit (HOSPITAL_COMMUNITY): Payer: Self-pay

## 2024-06-30 ENCOUNTER — Other Ambulatory Visit: Payer: Self-pay

## 2024-07-01 ENCOUNTER — Other Ambulatory Visit (HOSPITAL_COMMUNITY): Payer: Self-pay

## 2024-07-01 NOTE — Telephone Encounter (Signed)
 PAP: Patient assistance application for Ozempic  has been approved by PAP Companies: [employer] from 04/08/2024 to 03/29/2025. Medication should be delivered to PAP Delivery: Provider's office. For further shipping updates, please contact [employer] at [phone]. Patient ID is: not provided

## 2024-07-04 ENCOUNTER — Other Ambulatory Visit: Payer: Self-pay

## 2024-07-04 ENCOUNTER — Other Ambulatory Visit (HOSPITAL_COMMUNITY): Payer: Self-pay

## 2024-07-06 ENCOUNTER — Other Ambulatory Visit: Payer: Self-pay

## 2024-07-06 LAB — OPHTHALMOLOGY REPORT-SCANNED

## 2024-07-07 ENCOUNTER — Ambulatory Visit: Payer: Self-pay | Admitting: Nurse Practitioner

## 2025-01-27 ENCOUNTER — Ambulatory Visit: Payer: Self-pay

## 2025-02-02 ENCOUNTER — Encounter: Payer: Self-pay | Admitting: Nurse Practitioner

## 2025-02-08 ENCOUNTER — Ambulatory Visit: Admitting: Family Medicine
